# Patient Record
Sex: Female | Born: 1940 | Race: White | Hispanic: No | Smoking: Never smoker
Health system: Southern US, Community
[De-identification: ages and names within clinical notes are randomized; demographics above are authoritative.]

## PROBLEM LIST (undated history)

## (undated) DIAGNOSIS — I480 Paroxysmal atrial fibrillation: Secondary | ICD-10-CM

## (undated) DIAGNOSIS — E785 Hyperlipidemia, unspecified: Secondary | ICD-10-CM

## (undated) DIAGNOSIS — Z9289 Personal history of other medical treatment: Secondary | ICD-10-CM

## (undated) DIAGNOSIS — I701 Atherosclerosis of renal artery: Secondary | ICD-10-CM

## (undated) DIAGNOSIS — I10 Essential (primary) hypertension: Secondary | ICD-10-CM

## (undated) DIAGNOSIS — I779 Disorder of arteries and arterioles, unspecified: Secondary | ICD-10-CM

## (undated) DIAGNOSIS — I5189 Other ill-defined heart diseases: Secondary | ICD-10-CM

## (undated) DIAGNOSIS — I771 Stricture of artery: Secondary | ICD-10-CM

## (undated) DIAGNOSIS — I739 Peripheral vascular disease, unspecified: Secondary | ICD-10-CM

## (undated) HISTORY — DX: Paroxysmal atrial fibrillation: I48.0

## (undated) HISTORY — DX: Other ill-defined heart diseases: I51.89

## (undated) HISTORY — DX: Hyperlipidemia, unspecified: E78.5

## (undated) HISTORY — DX: Essential (primary) hypertension: I10

## (undated) HISTORY — DX: Personal history of other medical treatment: Z92.89

## (undated) HISTORY — DX: Peripheral vascular disease, unspecified: I73.9

## (undated) HISTORY — DX: Stricture of artery: I77.1

## (undated) HISTORY — DX: Disorder of arteries and arterioles, unspecified: I77.9

## (undated) HISTORY — DX: Atherosclerosis of renal artery: I70.1

---

## 1998-06-23 ENCOUNTER — Other Ambulatory Visit: Admission: RE | Admit: 1998-06-23 | Discharge: 1998-06-23 | Payer: Self-pay | Admitting: Obstetrics and Gynecology

## 2000-02-28 ENCOUNTER — Encounter: Admission: RE | Admit: 2000-02-28 | Discharge: 2000-02-28 | Payer: Self-pay | Admitting: Gynecology

## 2000-02-28 ENCOUNTER — Encounter: Payer: Self-pay | Admitting: Gynecology

## 2004-02-01 ENCOUNTER — Encounter: Admission: RE | Admit: 2004-02-01 | Discharge: 2004-02-01 | Payer: Self-pay | Admitting: Gynecology

## 2006-02-25 ENCOUNTER — Encounter: Admission: RE | Admit: 2006-02-25 | Discharge: 2006-02-25 | Payer: Self-pay | Admitting: Neurology

## 2006-04-03 ENCOUNTER — Encounter: Admission: RE | Admit: 2006-04-03 | Discharge: 2006-04-03 | Payer: Self-pay | Admitting: Neurology

## 2006-06-27 ENCOUNTER — Encounter: Admission: RE | Admit: 2006-06-27 | Discharge: 2006-06-27 | Payer: Self-pay | Admitting: Internal Medicine

## 2007-03-27 ENCOUNTER — Ambulatory Visit (HOSPITAL_COMMUNITY): Admission: RE | Admit: 2007-03-27 | Discharge: 2007-03-27 | Payer: Self-pay | Admitting: Internal Medicine

## 2007-07-15 ENCOUNTER — Encounter: Admission: RE | Admit: 2007-07-15 | Discharge: 2007-07-15 | Payer: Self-pay | Admitting: Internal Medicine

## 2007-10-28 ENCOUNTER — Encounter: Admission: RE | Admit: 2007-10-28 | Discharge: 2007-10-28 | Payer: Self-pay | Admitting: Cardiology

## 2007-11-02 ENCOUNTER — Encounter: Admission: RE | Admit: 2007-11-02 | Discharge: 2007-11-02 | Payer: Self-pay | Admitting: Cardiology

## 2007-11-03 ENCOUNTER — Observation Stay (HOSPITAL_COMMUNITY): Admission: RE | Admit: 2007-11-03 | Discharge: 2007-11-04 | Payer: Self-pay | Admitting: Cardiology

## 2007-11-03 HISTORY — PX: CARDIAC CATHETERIZATION: SHX172

## 2008-03-30 HISTORY — PX: OTHER SURGICAL HISTORY: SHX169

## 2008-07-15 ENCOUNTER — Encounter: Admission: RE | Admit: 2008-07-15 | Discharge: 2008-07-15 | Payer: Self-pay | Admitting: Internal Medicine

## 2008-07-26 ENCOUNTER — Ambulatory Visit: Payer: Self-pay | Admitting: Internal Medicine

## 2008-07-26 DIAGNOSIS — I10 Essential (primary) hypertension: Secondary | ICD-10-CM | POA: Insufficient documentation

## 2008-07-26 DIAGNOSIS — M316 Other giant cell arteritis: Secondary | ICD-10-CM

## 2008-07-26 DIAGNOSIS — I701 Atherosclerosis of renal artery: Secondary | ICD-10-CM | POA: Insufficient documentation

## 2008-07-26 DIAGNOSIS — M314 Aortic arch syndrome [Takayasu]: Secondary | ICD-10-CM

## 2008-08-18 ENCOUNTER — Ambulatory Visit: Payer: Self-pay | Admitting: Internal Medicine

## 2009-07-17 ENCOUNTER — Encounter: Admission: RE | Admit: 2009-07-17 | Discharge: 2009-07-17 | Payer: Self-pay | Admitting: Internal Medicine

## 2009-08-28 HISTORY — PX: TRANSTHORACIC ECHOCARDIOGRAM: SHX275

## 2009-09-29 HISTORY — PX: NM MYOCAR PERF WALL MOTION: HXRAD629

## 2009-11-07 ENCOUNTER — Inpatient Hospital Stay (HOSPITAL_COMMUNITY): Admission: AD | Admit: 2009-11-07 | Discharge: 2009-11-08 | Payer: Self-pay | Admitting: Diagnostic Radiology

## 2009-11-07 HISTORY — PX: RENAL ANGIOPLASTY: SHX2316

## 2010-10-25 ENCOUNTER — Encounter: Admission: RE | Admit: 2010-10-25 | Discharge: 2010-10-25 | Payer: Self-pay | Admitting: Internal Medicine

## 2010-12-30 HISTORY — PX: OTHER SURGICAL HISTORY: SHX169

## 2011-04-03 LAB — CBC
HCT: 32.6 % — ABNORMAL LOW (ref 36.0–46.0)
Hemoglobin: 11.3 g/dL — ABNORMAL LOW (ref 12.0–15.0)
MCHC: 34.6 g/dL (ref 30.0–36.0)
MCV: 92.3 fL (ref 78.0–100.0)
Platelets: 254 10*3/uL (ref 150–400)
RBC: 3.53 MIL/uL — ABNORMAL LOW (ref 3.87–5.11)
WBC: 10.8 10*3/uL — ABNORMAL HIGH (ref 4.0–10.5)

## 2011-04-03 LAB — BASIC METABOLIC PANEL
BUN: 24 mg/dL — ABNORMAL HIGH (ref 6–23)
CO2: 28 mEq/L (ref 19–32)
Calcium: 9 mg/dL (ref 8.4–10.5)
Chloride: 105 mEq/L (ref 96–112)
Glucose, Bld: 143 mg/dL — ABNORMAL HIGH (ref 70–99)
Potassium: 3.6 mEq/L (ref 3.5–5.1)

## 2011-05-14 NOTE — Cardiovascular Report (Signed)
NAME:  Gabriela Moyer, Gabriela Moyer                ACCOUNT NO.:  [id number]   MEDICAL RECORD NO.:  [id number]          PATIENT TYPE:  AMB   LOCATION:  SDS                          FACILITY:  [facility]   PHYSICIAN:  Vonna Kotyk R. Jacinto Halim, MD       DATE OF BIRTH:  1941/01/23   DATE OF PROCEDURE:  11/03/2007  DATE OF DISCHARGE:                            CARDIAC CATHETERIZATION   REFERRING PHYSICIANS:  Nicki Guadalajara, M.D. and Gaspar Garbe, M.D.   PROCTOR : DR. Erlene Quan.   PROCEDURE PERFORMED:  1. Arch aortogram.  2. Right subclavian arteriogram with follow-through.  3. Left subclavian arteriogram with follow-through  4. Abdominal aortogram.  5. Selective right and left renal arteriography.   INDICATIONS:  Gabriela Moyer a 70 year old female who was referred to me for  evaluation of upper extremity claudication, abnormal heart sounds.  She  also has difficult to control hypertension.  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  Because of uncontrolled  hypertension and a prominent abdominal bruit, we decided to proceed with  abdominal aortogram and also select right and left subclavian  arteriogram because of claudication symptoms.   ANGIOGRAPHIC DATA:  Right subclavian artery and follow-through:  Right  subclavian artery with follow-through revealed the right subclavian  artery to be diffusely diseased.  After the origin of the vertebral  artery, the right subclavian artery is hypoplastic with diffuse luminal  irregularity.  There is an 80% stenosis of the distal right subclavian  artery followed by axillary artery which appears to be occluded and has  ipsilateral bridging collaterals.  The brachial artery itself is widely  patent and smooth.   Below the right cubital fossa the brachial artery bifurcates into a  small lateral anterior artery and a large radial artery.  These are  patent.   Left subclavian artery  with follow-through:  Left subclavian artery with  follow-through again revealed similar appearance as to the right side.  However, the disease severity is much less.  There is a 50% stenosis of  the left subclavian artery after the origin of the vertebral artery.  The left axillary artery in the proximal segment also shows diffuse 50%  stenosis.  The left brachial artery is widely patent and again the ulnar  artery is small but patent, and the left radial artery is a larger  vessel and forms the palmar arch similar to the palmar arch on the right  side.   Abdominal aortogram:  Abdominal gram revealed the presence of 2 renal  arteries.  Right renal artery was not well visualized.   Selective right and left renal arteriography:  Select right and left  renal arteriography revealed widely patent left renal artery.  Right  renal artery showed an 80-90% stenosis.  There was a 70-80 mm pressure  gradient across the right renal. Artery ostium.   The abdominal aorta itself was normal.  No evidence of abdominal aortic  aneurysm and the aortoiliac bifurcation was widely patent.   IMPRESSION:  1. 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  2. Atherosclerotic right renal artery stenosis.  3. Difficult to control hypertension.   RECOMMENDATIONS:  The patient was taken off __________ for evaluation of  her angiograms, both of her upper extremities and also abdominal  aortogram and renal arteriogram.  I probably suspect an evaluation for  connective tissue disorders is indicated.  Although she has an occluded  right axillary artery which has bridging collaterals, I am not sure that  long-term success with angioplasty is great in people with vasculitides.  Hence a course of steroids at high dose and/or a low-dose chronic  steroid use may be considered.  She will benefit from  a rheumatology  consultation.   As far as right renal artery stenosis is concerned, there was severe  damping of the right renal artery pressures on engagement of the right  renal artery and this stenosis is pretty significant.  I will bring her  back for elective angioplasty of the right renal artery.   A total of 155 mL of contrast was utilized for diagnostic angiography.   TECHNIQUE OF PROCEDURE:  Under usual sterile precautions using a 5-  French pigtail catheter which was advanced to the arch of the aorta,  arch arteriogram was performed in the LAO projection.  Then using a  Bernstein catheter, I was selectively able to engage the right  subclavian artery and angiography was performed.  Nitroglycerin was also  administered intra-arterially.  The same catheter was utilized to engage  the left subclavian artery and angiography was performed.  Then the  catheter was pulled back into the abdominal aorta.  After having  performed abdominal aortogram with a pigtail catheter, the same  Bernstein catheter was utilized to engage the left renal artery, and a  short LIMA catheter was utilized to engage the right renal artery, and  then the catheters were pulled out of the body.  The patient tolerated  the procedure.  No immediate complication noted.      Cristy Hilts. Jacinto Halim, MD  Electronically Signed     JRG/MEDQ  D:  11/03/2007  T:  11/04/2007  Job:  [id number]   cc:   Nicki Guadalajara, M.D.  Gaspar Garbe, M.D.

## 2011-05-14 NOTE — Procedures (Signed)
NAME:  Gabriela Moyer, RAU NO.:  [id number]   MEDICAL RECORD NO.:  [id number]          PATIENT TYPE:  AMB   LOCATION:  SDS                          FACILITY:  [facility]   PHYSICIAN:  Nanetta Batty, M.D.   DATE OF BIRTH:  1941/08/17   DATE OF PROCEDURE:  DATE OF DISCHARGE:                    PERIPHERAL VASCULAR INVASIVE PROCEDURE   PROCEDURES:  RH anagram, selective right and left subclavian RA  angiogram, abdominal aortogram, selective right renal artery angiogram,  PTA and stent of right renal artery.   HISTORY:  Gabriela Moyer is a 70 year old moderately overweight, divorced  Caucasian female, mother of 2 and grandmother of 2 grandchildren who is  retired from the [employer] at age 38.  She has been followed  by Dr. Daphene Jaeger for years.  Her risk factors include hyperlipidemia and  hypertension both treated.  Her mother had bypass surgery at age 27.  She has never had a heart attack or stroke.  Denies chest pain or  shortness of breath.  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 Yates Decamp November 03, 2007.  She also has what  described as an 80% right renal artery stenosis with an 80 mm gradient  at the ostium.  She does complain of some right upper extremity  claudication.  Dopplers performed recently suggested progression of  disease of the right renal artery with decrease in pole-to-pole  dimension suggesting that the vessel had become more stenotic and that  the kidney had somewhat shrunken in size.  She presents now for  angiography of the subclavian arteries to document progression of  disease as well as elective PTA and stenting of her right renal artery  for renal preservation.   DESCRIPTION OF PROCEDURE:  The patient was brought to the second floor  [facility] in the postabsorptive state.  She was  premedicated with PR valium.   Her right groin was prepped and shaved in  the usual sterile fashion.  Xylocaine 1% was used for local anesthesia.  A 6-French sheath was inserted into the right femoral artery using  standard Seldinger technique.  A 5-French pigtailed catheter was used  for arch angiography and distal abdominal aortography.  Selective right  and left subclavian artery angiography was performed with a 5-French  long range Judkins' catheter as was selective right renal artery  angiography.  Visipaque dye was used for the entirety of the case.  Retrograde aortic pressure was monitored during the case.   ANGIOGRAPHIC RESULTS:  1. Arch aortogram:  Type 2 arch:      a.     Right subclavian artery:  80% distal right subclavian artery       stenosis, 99% right axillary artery stenosis.      b.     Diffuse 60% segmental distal left subclavian artery       stenosis.  2. Abdominal aortography:      a.     Renal artery:  80% ostial right renal artery stenosis.      b.  Normal infrarenal abdominal aorta.      c.     Iliac bifurcation was free of significant disease.   The patient received 2500 units of heparin intravenously.  Using a 6-  [place] short right Judkins' guide catheter along with ON4 190 Spartacore  Abbott guidewire and a 4 mm x 1.5 cm Biotrack Abbott balloon,  predilation was performed at the ostium of the right renal artery.  Prior to this, a transstenotic gradient was measured at 65 mmHg using  the 5-French right Judkins' catheter.  A 5 mm x 15 mm long Cordis  Genesis Aviator stent was then placed at the ostium of the right renal  artery and deployed at 6 atmospheres.  A final inflation was performed  at 8 atmospheres resulting in reduction of the 80% ostial right renal  artery stenosis with 0% residual with excellent flow.  The patient  tolerated the procedure well.  The guidewire and catheter were removed  __________measured.  The sheath was  removed and pressure was held on the groin to  achieve hemostasis.  The  patient left the lab in stable condition.  She will be gently hydrated  overnight and discharged home in the morning.  She can follow up renal  Doppler studies after which she will be seen by me in followup.      Nanetta Batty, M.D.  Electronically Signed     JB/MEDQ  D:  11/07/2009  T:  11/07/2009  Job:  [id number]   cc:   [facility]  Gaspar Garbe, M.D.  Lemmie Evens, M.D.  Nicki Guadalajara, M.D.

## 2011-05-14 NOTE — Discharge Summary (Signed)
NAME:  Gabriela Moyer, Gabriela Moyer NO.:  [id number]   MEDICAL RECORD NO.:  [id number]          PATIENT TYPE:  INP   LOCATION:  2001                         FACILITY:  [facility]   PHYSICIAN:  Cristy Hilts. Jacinto Halim, MD       DATE OF BIRTH:  [birth year]   DATE OF ADMISSION:  11/03/2007  DATE OF DISCHARGE:  11/04/2007                               DISCHARGE SUMMARY   HISTORY OF PRESENT ILLNESS:  Ms. Guarino is a 70 year old female patient  of Dr. Nicki Guadalajara and Dr. Guerry Bruin.  She was complaining of  fatigue in both her upper extremities, thus, she was referred to Dr.  Jacinto Halim for PV evaluation.  She apparently had upper extremity Dopplers  that showed high-grade stenosis on the right, more than the left,  subclavian artery stenosis, high-grade stenosis of the right __________  and the left subclavian artery, moderate stenosis.  Thus, she came into  the hospital for peripheral vascular angiogram.  She had a  nonobstructive bilateral subclavian stenosis which Dr. 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  He did find that  she had an 80% right renal artery stenosis.  She apparently had an  episode of hypotension.  Thus, she was kept overnight.  Her HCTZ was  held, and her Norvasc was decreased.  Her blood pressure on the day of  discharge was 124/48, pulse was 68, respirations were 12.  Dr. Jacinto Halim  recommended follow-up in the office to discuss management of possible  vasculitis, etiology of her upper extremity fatigue.   DISCHARGE MEDICATIONS:  1. Toprol XL 50 mg every day.  2. Aspirin 81 mg every day.  3. Altace 5 mg every day.  4. Plavix 75 mg every day.  5. Crestor 10 mg every day.  6. Norvasc 5 mg per day.  7. Vitamin B12.   DISCHARGE DIAGNOSES:  1. Upper extremity fatigue, not related to atherosclerotic stenosis,      possible vasculitis etiology to be worked up further as an      outpatient.  2. ASCVD with right renal artery stenosis 80%.  3.  Episode of hypotension, medications adjusted.  4. History of hypertension.  5. Dyslipidemia.   PLAN:  She was told not do any heavy lifting, pushing, pulling or  extended walking for one week, and she had will follow up with Dr. Jacinto Halim  on November 10 at 10:15 a.m. thank you.      Lezlie Octave, N.P.      Cristy Hilts. Jacinto Halim, MD  Electronically Signed    BB/MEDQ  D:  11/04/2007  T:  11/04/2007  Job:  [id number]   cc:   Dr. Leta Jungling, M.D.

## 2011-10-08 LAB — CBC
HCT: 34.1 — ABNORMAL LOW
MCHC: 34.2
RDW: 12.1
WBC: 9

## 2011-10-08 LAB — BASIC METABOLIC PANEL
CO2: 30
Calcium: 8.8
Chloride: 105
GFR calc non Af Amer: >60

## 2011-10-08 LAB — URINALYSIS, ROUTINE W REFLEX MICROSCOPIC
Ketones, ur: 15 — AB
Protein, ur: NEGATIVE
Urobilinogen, UA: 0.2
pH: 6

## 2011-10-08 LAB — URINE MICROSCOPIC-ADD ON

## 2011-11-13 ENCOUNTER — Other Ambulatory Visit: Payer: Self-pay | Admitting: Internal Medicine

## 2011-11-13 DIAGNOSIS — Z1231 Encounter for screening mammogram for malignant neoplasm of breast: Secondary | ICD-10-CM

## 2011-12-19 ENCOUNTER — Ambulatory Visit
Admission: RE | Admit: 2011-12-19 | Discharge: 2011-12-19 | Disposition: A | Discharge status: Home / Self Care | Payer: Medicare Other | Source: Ambulatory Visit | Attending: Internal Medicine | Admitting: Internal Medicine

## 2011-12-19 DIAGNOSIS — Z1231 Encounter for screening mammogram for malignant neoplasm of breast: Secondary | ICD-10-CM

## 2012-04-29 HISTORY — PX: OTHER SURGICAL HISTORY: SHX169

## 2012-11-27 ENCOUNTER — Other Ambulatory Visit: Payer: Self-pay | Admitting: Internal Medicine

## 2012-11-27 DIAGNOSIS — Z1231 Encounter for screening mammogram for malignant neoplasm of breast: Secondary | ICD-10-CM

## 2013-01-08 ENCOUNTER — Ambulatory Visit: Payer: Medicare Other

## 2013-02-05 ENCOUNTER — Ambulatory Visit
Admission: RE | Admit: 2013-02-05 | Discharge: 2013-02-05 | Disposition: A | Discharge status: Home / Self Care | Payer: Medicare Other | Source: Ambulatory Visit | Attending: Internal Medicine | Admitting: Internal Medicine

## 2013-02-05 DIAGNOSIS — Z1231 Encounter for screening mammogram for malignant neoplasm of breast: Secondary | ICD-10-CM

## 2013-06-17 ENCOUNTER — Other Ambulatory Visit (HOSPITAL_COMMUNITY): Payer: Self-pay | Admitting: Cardiovascular Disease

## 2013-06-17 DIAGNOSIS — I739 Peripheral vascular disease, unspecified: Secondary | ICD-10-CM

## 2013-08-16 ENCOUNTER — Encounter (HOSPITAL_COMMUNITY): Payer: Self-pay | Admitting: Cardiovascular Disease

## 2013-09-03 ENCOUNTER — Ambulatory Visit (HOSPITAL_COMMUNITY)
Admission: RE | Admit: 2013-09-03 | Discharge: 2013-09-03 | Disposition: A | Discharge status: Home / Self Care | Payer: Medicare Other | Source: Ambulatory Visit | Attending: Cardiovascular Disease | Admitting: Cardiovascular Disease

## 2013-09-03 ENCOUNTER — Other Ambulatory Visit (HOSPITAL_COMMUNITY): Payer: Self-pay | Admitting: Cardiovascular Disease

## 2013-09-03 ENCOUNTER — Ambulatory Visit (HOSPITAL_BASED_OUTPATIENT_CLINIC_OR_DEPARTMENT_OTHER)
Admission: RE | Admit: 2013-09-03 | Discharge: 2013-09-03 | Disposition: A | Discharge status: Home / Self Care | Payer: Medicare Other | Source: Ambulatory Visit | Attending: Cardiovascular Disease | Admitting: Cardiovascular Disease

## 2013-09-03 DIAGNOSIS — I771 Stricture of artery: Secondary | ICD-10-CM

## 2013-09-03 DIAGNOSIS — I739 Peripheral vascular disease, unspecified: Secondary | ICD-10-CM

## 2013-09-03 DIAGNOSIS — I10 Essential (primary) hypertension: Secondary | ICD-10-CM

## 2013-09-03 DIAGNOSIS — I701 Atherosclerosis of renal artery: Secondary | ICD-10-CM

## 2013-09-03 NOTE — Progress Notes (Addendum)
Upper Ext. Arterial Duplex Completed. Glenda Kunst, BS, RDMS, RVT  

## 2013-09-03 NOTE — Progress Notes (Signed)
Renal Duplex Completed. Gabriela Moyer, BS, RDMS, RVT  

## 2013-09-20 ENCOUNTER — Telehealth: Payer: Self-pay | Admitting: *Deleted

## 2013-09-20 ENCOUNTER — Encounter: Payer: Self-pay | Admitting: *Deleted

## 2013-09-20 DIAGNOSIS — I739 Peripheral vascular disease, unspecified: Secondary | ICD-10-CM

## 2013-09-20 NOTE — Telephone Encounter (Signed)
Message copied by Marella Bile on Mon Sep 20, 2013 10:27 PM ------      Message from: Runell Gess      Created: Sun Sep 19, 2013 11:02 AM       No change from prior study. Repeat in 12 months. ------

## 2013-09-20 NOTE — Telephone Encounter (Signed)
Order placed for repeat upper extremity dopplers and renal dopplers

## 2013-10-21 ENCOUNTER — Encounter: Payer: Self-pay | Admitting: Cardiovascular Disease

## 2013-10-21 ENCOUNTER — Ambulatory Visit (INDEPENDENT_AMBULATORY_CARE_PROVIDER_SITE_OTHER): Payer: Medicare Other | Admitting: Cardiovascular Disease

## 2013-10-21 VITALS — BP 140/92 | HR 73 | Ht 62.0 in | Wt 165.7 lb

## 2013-10-21 DIAGNOSIS — I771 Stricture of artery: Secondary | ICD-10-CM

## 2013-10-21 DIAGNOSIS — E039 Hypothyroidism, unspecified: Secondary | ICD-10-CM

## 2013-10-21 DIAGNOSIS — I701 Atherosclerosis of renal artery: Secondary | ICD-10-CM

## 2013-10-21 DIAGNOSIS — I10 Essential (primary) hypertension: Secondary | ICD-10-CM

## 2013-10-21 MED ORDER — LOSARTAN POTASSIUM 25 MG PO TABS: 25.0000 mg | ORAL_TABLET | Freq: Two times a day (BID) | ORAL | Status: DC

## 2013-10-21 NOTE — Patient Instructions (Signed)
Your physician recommends that you schedule a follow-up appointment in: 6 months  Your physician has recommended you make the following change in your medication: Start Losartan 25 mg take 1 tablets daily for 1 week, then take 1 tablet twice a day

## 2013-10-31 ENCOUNTER — Encounter: Payer: Self-pay | Admitting: Cardiovascular Disease

## 2013-10-31 DIAGNOSIS — E039 Hypothyroidism, unspecified: Secondary | ICD-10-CM | POA: Insufficient documentation

## 2013-10-31 DIAGNOSIS — I771 Stricture of artery: Secondary | ICD-10-CM | POA: Insufficient documentation

## 2013-10-31 NOTE — Progress Notes (Signed)
Patient ID: Gabriela Moyer, female   DOB: 20-Sep-1941, 72 y.o.   MRN: [id number]     HPI: Gabriela Moyer is a 72 y.o. female presents to the office for cardiology reevaluation Satira Mccallum saw her one year ago.  Gabriela Moyer has a history of diffuse peripheral vascular disease involving her subclavian, axillary, brachial vessels as well as renal arteries. In July 2010 she underwent right renal artery stenting and at that time at 8% ostial gradient. 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 Additional problems also include hypertension as well as hyperlipidemia. Since I last saw her, she did see Dr. Allyson Sabal in April 2014 for followup of her peripheral vascular disease. In September 2014 she underwent recent Doppler study which again confirmed bilateral brachial pressures consistent with right upper extremity inflow disease. There was a large amount of diffuse atherosclerotic plaque with elevated velocities in the right distal subclavian and axillary arteries concordant with her known 70-99% diameter reduction. She also had moderate amount of diffuse atherosclerotic plaque with elevated velocities consistent with a 50-69% diameter reduction in her left distal subclavian/axillary and brachial arteries.  Since I last saw Gabriela Moyer, she apparently is no longer taking her Prempro she is on Crestor for hyperlipidemia but in the past apparently did not tolerate Zetia addition. She will be having a physical by Dr. Neale Burly and laboratory will be checked by him.  She denies chest pain. She denies palpitations. She denies shortness of breath.   History reviewed. No pertinent past medical history.  History reviewed. No pertinent past surgical history.  Allergies  Allergen Reactions  . Codeine   . Morphine     Current Outpatient Prescriptions  Medication Sig Dispense Refill  . amLODipine (NORVASC) 10 MG tablet Take 10 mg by mouth daily.      Marland Kitchen aspirin 81 MG  tablet Take 81 mg by mouth daily.      . Cholecalciferol (VITAMIN D3) 1000 UNITS CAPS Take 2 capsules by mouth daily.      . clopidogrel (PLAVIX) 75 MG tablet Take 75 mg by mouth daily.      . hydrochlorothiazide (HYDRODIURIL) 25 MG tablet Take 25 mg by mouth daily.      Marland Kitchen levothyroxine (SYNTHROID, LEVOTHROID) 25 MCG tablet Take 25 mcg by mouth daily before breakfast.      . metoprolol tartrate (LOPRESSOR) 25 MG tablet Take 25 mg by mouth 2 (two) times daily.      . rosuvastatin (CRESTOR) 20 MG tablet Take 20 mg by mouth daily.      Marland Kitchen losartan (COZAAR) 25 MG tablet Take 1 tablet (25 mg total) by mouth 2 (two) times daily.  60 tablet  11   No current facility-administered medications for this visit.    History   Social History  . Marital Status: Divorced    Spouse Name: N/A    Number of Children: N/A  . Years of Education: N/A   Occupational History  . Not on file.   Social History Main Topics  . Smoking status: Passive Smoke Exposure - Never Smoker  . Smokeless tobacco: Never Used  . Alcohol Use: No  . Drug Use: Not on file  . Sexual Activity: Not on file   Other Topics Concern  . Not on file   Social History Narrative  . No narrative on file    Family History  Problem Relation Age of Onset  . Heart disease Mother   . Cancer  Father    Social history is notable in that she is divorced. She has 2 children 2 grandchildren. Has no history of tobacco use. She does not use alcohol. ROS is negative for fevers, chills or night sweats.  She denies visual symptoms. She denies palpitations. She denies cough or sputum production. She denies chest pain. She denies bleeding on aspirin and Plavix. She denies blood in her stool or urine. She denies abdominal changes. She denies myalgias. She does have history of hypothyroidism on low-dose Synthroid. She denies claudication or lower extremity.  Other comprehensive 12 point system review is negative.  PE BP 140/92  Pulse 73  Ht 5\' 2"   (1.575 m)  Wt 165 lb 11.2 oz (75.161 kg)  BMI 30.30 kg/m2  Blood pressure in her left arm recheck by me was 140/90. Blood pressure right arm is hard to discern. General: Alert, oriented, no distress.  Skin: normal turgor, no rashes HEENT: Normocephalic, atraumatic. Pupils round and reactive; sclera anicteric;no lid lag.  Nose without nasal septal hypertrophy Mouth/Parynx benign; Mallinpatti scale 3 Neck: No JVD, bilateral subclavian bruits right greater than left Lungs: clear to ausculatation and percussion; no wheezing or rales Heart: RRR, s1 s2 normal 1/6 systolic murmur. Abdomen: soft, nontender; no hepatosplenomehaly, BS+; abdominal aorta nontender and not dilated by palpation. Pulses: bilateral subclavian bruits right greater than left. Extremities: no clubbing cyanosis or edema, Homan's sign negative  Neurologic: grossly nonfocal Psychologic: normal affect and mood.  ECG: Sinus rhythm at 73 beats per minute. Poor progression precordially. Mild J-point early repolarization changes inferolaterally  LABS:  BMET    Component Value Date/Time   NA 139 11/08/2009 0425   K 3.6 11/08/2009 0425   CL 105 11/08/2009 0425   CO2 28 11/08/2009 0425   GLUCOSE 143* 11/08/2009 0425   BUN 24* 11/08/2009 0425   CREATININE 0.99 11/08/2009 0425   CALCIUM 9.0 11/08/2009 0425   GFRNONAA 56* 11/08/2009 0425   GFRAA  Value: >60        The eGFR has been calculated using the MDRD equation. This calculation has not been validated in all clinical situations. eGFR's persistently <60 mL/min signify possible Chronic Kidney Disease. 11/08/2009 0425     Hepatic Function Panel  No results found for this basename: prot, albumin, ast, alt, alkphos, bilitot, bilidir, ibili     CBC    Component Value Date/Time   WBC 10.8* 11/08/2009 0425   RBC 3.53* 11/08/2009 0425   HGB 11.3* 11/08/2009 0425   HCT 32.6* 11/08/2009 0425   PLT 254 11/08/2009 0425   MCV 92.3 11/08/2009 0425   MCHC 34.6 11/08/2009  0425   RDW 12.4 11/08/2009 0425     BNP No results found for this basename: probnp    Lipid Panel  No results found for this basename: chol, trig, hdl, cholhdl, vldl, ldlcalc     RADIOLOGY: No results found.    ASSESSMENT AND PLAN: Gabriela Moyer has history of diffuse peripheral vascular disease and is status post stenting of a right renal artery November 2010. She has had followup duplex imaging and this has remained stable. She continues to be fairly symptom-free with reference to her right subclavian artery stenosis. She now is off ACE inhibition. I elected to start her on for a low-dose ARB therapy with Cozaar 25 mg for one week and then if she tolerates this we'll increase this to 25 mg twice a day. She will have followup laboratory done by Dr. Dora Sims to make certain she  is tolerating this and to continue dual antiplatelet therapy. I will see her in 6 months for cardiology reevaluation.     Lennette Bihari, MD, [facility]  10/31/2013 3:27 PM

## 2013-11-02 ENCOUNTER — Encounter: Payer: Self-pay | Admitting: Cardiovascular Disease

## 2013-11-12 ENCOUNTER — Other Ambulatory Visit: Payer: Self-pay | Admitting: *Deleted

## 2013-11-12 MED ORDER — METOPROLOL TARTRATE 25 MG PO TABS: 25.0000 mg | ORAL_TABLET | Freq: Two times a day (BID) | ORAL | Status: DC

## 2013-11-12 NOTE — Telephone Encounter (Signed)
Rx was sent to pharmacy electronically. 

## 2013-12-15 ENCOUNTER — Other Ambulatory Visit: Payer: Self-pay | Admitting: Cardiovascular Disease

## 2013-12-15 NOTE — Telephone Encounter (Signed)
Rx was sent to pharmacy electronically. 

## 2014-03-22 ENCOUNTER — Other Ambulatory Visit (HOSPITAL_BASED_OUTPATIENT_CLINIC_OR_DEPARTMENT_OTHER): Payer: Self-pay | Admitting: Internal Medicine

## 2014-03-22 DIAGNOSIS — Z1231 Encounter for screening mammogram for malignant neoplasm of breast: Secondary | ICD-10-CM

## 2014-04-20 ENCOUNTER — Encounter: Payer: Self-pay | Admitting: *Deleted

## 2014-04-28 ENCOUNTER — Ambulatory Visit (INDEPENDENT_AMBULATORY_CARE_PROVIDER_SITE_OTHER): Payer: Commercial Managed Care - HMO | Admitting: Cardiovascular Disease

## 2014-04-28 ENCOUNTER — Encounter: Payer: Self-pay | Admitting: Cardiovascular Disease

## 2014-04-28 VITALS — BP 128/72 | HR 73 | Ht 62.0 in | Wt 162.1 lb

## 2014-04-28 DIAGNOSIS — I10 Essential (primary) hypertension: Secondary | ICD-10-CM

## 2014-04-28 DIAGNOSIS — E039 Hypothyroidism, unspecified: Secondary | ICD-10-CM

## 2014-04-28 DIAGNOSIS — E785 Hyperlipidemia, unspecified: Secondary | ICD-10-CM

## 2014-04-28 DIAGNOSIS — I771 Stricture of artery: Secondary | ICD-10-CM

## 2014-04-28 DIAGNOSIS — I701 Atherosclerosis of renal artery: Secondary | ICD-10-CM

## 2014-04-28 NOTE — Progress Notes (Signed)
Patient ID: Gabriela Moyer, female   DOB: May 31, 1941, 73 y.o.   MRN: [id number]     HPI: Gabriela Moyer is a 73 y.o. female presents to the office for a 6 month cardiology evaluation.    [name] has a history of diffuse peripheral vascular disease involving her subclavian, axillary, brachial vessels as well as renal arteries. In July 2010 she underwent right renal artery stenting and at that time at 80 mm ostial gradient. 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 Additional problems also include hypertension as well as hyperlipidemia. She saw  Dr. Gwenlyn Found in April 2014 for followup of her peripheral vascular disease. In September 2014 she underwent recent Doppler study which again confirmed bilateral brachial pressures consistent with right upper extremity inflow disease. There was a large amount of diffuse atherosclerotic plaque with elevated velocities in the right distal subclavian and axillary arteries concordant with her known 70-99% diameter reduction. She also had moderate amount of diffuse atherosclerotic plaque with elevated velocities consistent with a 50-69% diameter reduction in her left distal subclavian/axillary and brachial arteries.  She has a history of hypothyroidism, for, which she's on Synthroid at low dose 25 mcg, as well as a history of hyperlipidemia, and has been tolerating Crestor 20 mg daily.  She did have blood work done by Dr. Nona Dell in December.  I was able to obtain these from his office today.  I reviewed this laboratory.  Potassium was 4.0.  Normal renal function with acrania 0.7.  Thyroid function was normal.  Hemoglobin A1c was excellent at 5.4.  There is no evidence for microalbuminuria cholesterol was very good with a total close to 153, triglycerides 137, HDL 54, LDL 72.  Non-HDL cholesterol 99.  She has remained active.  She has recently had a cold and URI congestion.  This is resolving. She denies chest  pain. She denies palpitations. She denies shortness of breath.   Past Medical History  Diagnosis Date  . PVD (peripheral vascular disease)   . Hypertension   . Hyperlipidemia   . Subclavian arterial stenosis     Past Surgical History  Procedure Laterality Date  . Transthoracic echocardiogram  08/28/2009    EF=>55%; trace TR; mild AV regurg, mild pulm valve regurg  . Nm myocar perf wall motion  09/2009    bruce myoview - normal perfusion, EF 915, low risk scan  . Cardiac catheterization  11/03/2007    atherosclerotic right renal artery (Dr. Jackie Plum)  . Renal artery doppler  04/2012    celiac artery with >50% diameter reduction; R renal artery stent w/mildly elevated velocities (1-59% diameter reduction)  . Upper extremity arterial doppler  04/2012    right brachial pressure 31mHg, left 1252mg; R distal subclavian & axillary arteries 70-99% diameter reduction; L distal subclavian/axillary & brachial arteries (50-69% diameter reduction)  . Carotid doppler  12/2010    R & L ICA with small amount fibrous plaque; R distal subclav. 70-99% diameter reduction; L mid subclav 50-69% diameter reduction  . Lower extremity arterial doppler  03/2008    right ABI - mild arterial insuff at rest; R CIA with less than 50% diameter reduction, R & L SFA with less than 50% diameter reduction  . Renal angioplasty  11/07/2009    [id number] Genesis Aviator stent to ostium of right renal artery (Dr. J. Adora Fridge  Allergies  Allergen Reactions  . Codeine   . Contrast Media [Iodinated Diagnostic  Agents]   . Morphine     Current Outpatient Prescriptions  Medication Sig Dispense Refill  . amLODipine (NORVASC) 10 MG tablet TAKE ONE TABLET BY MOUTH ONCE DAILY  90 tablet  3  . aspirin 81 MG tablet Take 81 mg by mouth daily.      . clopidogrel (PLAVIX) 75 MG tablet TAKE ONE TABLET BY MOUTH ONCE DAILY  90 tablet  3  . hydrochlorothiazide (HYDRODIURIL) 25 MG tablet Take 25 mg by mouth daily.      Marland Kitchen  levothyroxine (SYNTHROID, LEVOTHROID) 25 MCG tablet Take 25 mcg by mouth daily before breakfast.      . metoprolol tartrate (LOPRESSOR) 25 MG tablet Take 1 tablet (25 mg total) by mouth 2 (two) times daily.  60 tablet  11  . rosuvastatin (CRESTOR) 20 MG tablet Take 20 mg by mouth daily.       No current facility-administered medications for this visit.    History   Social History  . Marital Status: Divorced    Spouse Name: N/A    Number of Children: 2  . Years of Education: 12   Occupational History  . Not on file.   Social History Main Topics  . Smoking status: Passive Smoke Exposure - Never Smoker  . Smokeless tobacco: Never Used  . Alcohol Use: No  . Drug Use: Not on file  . Sexual Activity: Not on file   Other Topics Concern  . Not on file   Social History Narrative  . No narrative on file    Family History  Problem Relation Age of Onset  . Heart disease Mother   . Stroke Mother   . Cancer Father   . Heart Problems Maternal Grandmother   . Heart Problems Maternal Grandfather   . Cancer Brother    Social history is notable in that she is divorced. She has 2 children 2 grandchildren. Has no history of tobacco use. She does not use alcohol.  ROS General: Negative; No fevers, chills, or night sweats;  HEENT: Negative; No changes in vision or hearing, sinus congestion, difficulty swallowing Pulmonary: Positive for recent URI symptoms, which have now improved.; No cough, wheezing, shortness of breath, hemoptysis Cardiovascular: Negative; No chest pain, presyncope, syncope, palpatations GI: Negative; No nausea, vomiting, diarrhea, or abdominal pain GU: Negative; No dysuria, hematuria, or difficulty voiding Musculoskeletal: Negative; no myalgias, joint pain, or weakness Hematologic/Oncology: Negative; no easy bruising, bleeding Endocrine: Positive for hypothyroidism.  Negative for diabetes.; no heat/cold intolerance Neuro: Negative; no changes in balance,  headaches Skin: Negative; No rashes or skin lesions Psychiatric: Negative; No behavioral problems, depression Sleep: Negative; No snoring, daytime sleepiness, hypersomnolence, bruxism, restless legs, hypnogognic hallucinations, no cataplexy Other comprehensive 14 point system review is negative   PE BP 128/72  Pulse 73  Ht 5' 2" (1.575 m)  Wt 162 lb 1.6 oz (73.528 kg)  BMI 29.64 kg/m2  Blood pressure in her left arm recheck by me was 122/70. Blood pressure right arm is 100/66 General: Alert, oriented, no distress.  Skin: normal turgor, no rashes HEENT: Normocephalic, atraumatic. Pupils round and reactive; sclera anicteric;no lid lag.  Nose without nasal septal hypertrophy Mouth/Parynx benign; Mallinpatti scale 3 Neck: No JVD, bilateral subclavian bruits right greater than left Chest wall: Nontender to palpation Lungs: clear to ausculatation and percussion; no wheezing or rales Heart: RRR, s1 s2 normal 1/6 systolic murmur. Abdomen: soft, nontender; no hepatosplenomehaly, BS+; abdominal aorta nontender and not dilated by palpation. Pulses: bilateral subclavian bruits right greater  than left. Back: No CVA tenderness Musculoskeletal: No joint tenderness Extremities: Trace ankle swelling, left greater than right no clubbing cyanosis, Homan's sign negative  Neurologic: grossly nonfocal Psychologic: normal affect and mood.  ECG (independently read by me): Sinus rhythm 73 beats per minute.  No significant ST-T changes.  Mild early repolarization changes.  Prior 10/21/2013 ECG: Sinus rhythm at 73 beats per minute. Poor progression precordially. Mild J-point early repolarization changes inferolaterally  LABS:  BMET    Component Value Date/Time   NA 139 11/08/2009 0425   K 3.6 11/08/2009 0425   CL 105 11/08/2009 0425   CO2 28 11/08/2009 0425   GLUCOSE 143* 11/08/2009 0425   BUN 24* 11/08/2009 0425   CREATININE 0.99 11/08/2009 0425   CALCIUM 9.0 11/08/2009 0425   GFRNONAA 56*  11/08/2009 0425   GFRAA  Value: >60        The eGFR has been calculated using the MDRD equation. This calculation has not been validated in all clinical situations. eGFR's persistently <60 mL/min signify possible Chronic Kidney Disease. 11/08/2009 0425     Hepatic Function Panel  No results found for this basename: prot,  albumin,  ast,  alt,  alkphos,  bilitot,  bilidir,  ibili     CBC    Component Value Date/Time   WBC 10.8* 11/08/2009 0425   RBC 3.53* 11/08/2009 0425   HGB 11.3* 11/08/2009 0425   HCT 32.6* 11/08/2009 0425   PLT 254 11/08/2009 0425   MCV 92.3 11/08/2009 0425   MCHC 34.6 11/08/2009 0425   RDW 12.4 11/08/2009 0425     BNP No results found for this basename: probnp    Lipid Panel  No results found for this basename: chol,  trig,  hdl,  cholhdl,  vldl,  ldlcalc     RADIOLOGY: No results found.    ASSESSMENT AND PLAN: Ms. Isabel Caprice has history of diffuse peripheral vascular disease and is status post stenting of a right renal artery November 2010. She has had followup duplex imaging and this has remained stable. She continues to be fairly symptom-free with reference to her right subclavian artery stenosis.  She did not tolerate a disinhibition, and when I last saw her.  I suggested a low-dose trial of losartan for ARB therapy.  She can stop this.  Presently, her blood pressure is well controlled.  I meant it on her weight loss of 3 pounds, and recommended continued weight loss, and increased activity as tolerated.  I reviewed the laboratory done by Dr. Johann Capers back.  She does have diffuse peripheral vascular disease as noted above.  She's not having any chest pressure.  Her blood pressure today is actually improved in the right arm compared to her previous evaluation.  She does note some trace ankle swelling may be contributed by her amlodipine.  I will see her in 6 months for followup evaluation   Troy Sine, MD, [facility]  04/28/2014 11:52 AM

## 2014-04-28 NOTE — Patient Instructions (Signed)
Your physician recommends that you schedule a follow-up appointment in: 6 months. No changes were made today in your therapy. 

## 2014-05-02 ENCOUNTER — Ambulatory Visit (HOSPITAL_BASED_OUTPATIENT_CLINIC_OR_DEPARTMENT_OTHER)
Admission: RE | Admit: 2014-05-02 | Discharge: 2014-05-02 | Disposition: A | Discharge status: Home / Self Care | Payer: Medicare HMO | Source: Ambulatory Visit | Attending: Internal Medicine | Admitting: Internal Medicine

## 2014-05-02 DIAGNOSIS — Z1231 Encounter for screening mammogram for malignant neoplasm of breast: Secondary | ICD-10-CM | POA: Insufficient documentation

## 2014-09-01 ENCOUNTER — Telehealth (HOSPITAL_COMMUNITY): Payer: Self-pay | Admitting: *Deleted

## 2014-09-13 ENCOUNTER — Other Ambulatory Visit (HOSPITAL_COMMUNITY): Payer: Self-pay | Admitting: Cardiovascular Disease

## 2014-09-13 ENCOUNTER — Telehealth (HOSPITAL_COMMUNITY): Payer: Self-pay | Admitting: *Deleted

## 2014-09-13 DIAGNOSIS — I739 Peripheral vascular disease, unspecified: Secondary | ICD-10-CM

## 2014-09-21 ENCOUNTER — Ambulatory Visit (HOSPITAL_COMMUNITY)
Admission: RE | Admit: 2014-09-21 | Discharge: 2014-09-21 | Disposition: A | Discharge status: Home / Self Care | Payer: Medicare HMO | Source: Ambulatory Visit | Attending: Cardiology | Admitting: Cardiology

## 2014-09-21 ENCOUNTER — Ambulatory Visit (HOSPITAL_BASED_OUTPATIENT_CLINIC_OR_DEPARTMENT_OTHER)
Admission: RE | Admit: 2014-09-21 | Discharge: 2014-09-21 | Disposition: A | Discharge status: Home / Self Care | Payer: Medicare HMO | Source: Ambulatory Visit | Attending: Cardiology | Admitting: Cardiology

## 2014-09-21 DIAGNOSIS — I10 Essential (primary) hypertension: Secondary | ICD-10-CM

## 2014-09-21 DIAGNOSIS — I739 Peripheral vascular disease, unspecified: Secondary | ICD-10-CM | POA: Insufficient documentation

## 2014-09-21 DIAGNOSIS — M316 Other giant cell arteritis: Secondary | ICD-10-CM

## 2014-09-21 DIAGNOSIS — I701 Atherosclerosis of renal artery: Secondary | ICD-10-CM | POA: Diagnosis present

## 2014-09-21 NOTE — Progress Notes (Signed)
Upper Ext. Arterial Duplex Completed. Abby Tucholski, BS, RDMS, RVT  

## 2014-09-21 NOTE — Progress Notes (Signed)
Renal Duplex Completed. Maxey Ransom, BS, RDMS, RVT  

## 2014-09-30 ENCOUNTER — Encounter: Payer: Self-pay | Admitting: *Deleted

## 2014-11-09 ENCOUNTER — Other Ambulatory Visit: Payer: Self-pay | Admitting: Cardiovascular Disease

## 2014-12-08 ENCOUNTER — Other Ambulatory Visit: Payer: Self-pay | Admitting: Cardiovascular Disease

## 2014-12-08 NOTE — Telephone Encounter (Signed)
Rx refill sent to patient pharmacy   

## 2014-12-26 ENCOUNTER — Encounter: Payer: Self-pay | Admitting: Cardiovascular Disease

## 2014-12-26 ENCOUNTER — Encounter: Payer: Self-pay | Admitting: Cardiology

## 2014-12-27 ENCOUNTER — Other Ambulatory Visit: Payer: Self-pay | Admitting: Cardiovascular Disease

## 2014-12-27 NOTE — Telephone Encounter (Signed)
Rx(s) sent to pharmacy electronically.  

## 2015-03-27 ENCOUNTER — Other Ambulatory Visit: Payer: Self-pay | Admitting: Cardiovascular Disease

## 2015-03-28 NOTE — Telephone Encounter (Signed)
Rx refill sent to patient pharmacy   

## 2015-04-22 ENCOUNTER — Other Ambulatory Visit: Payer: Self-pay | Admitting: Cardiovascular Disease

## 2015-04-24 NOTE — Telephone Encounter (Signed)
Rx has been sent to the pharmacy electronically. ° °

## 2015-06-08 ENCOUNTER — Ambulatory Visit (INDEPENDENT_AMBULATORY_CARE_PROVIDER_SITE_OTHER): Payer: Commercial Managed Care - HMO | Admitting: Cardiovascular Disease

## 2015-06-08 VITALS — BP 134/90 | HR 68 | Ht 62.0 in | Wt 164.7 lb

## 2015-06-08 DIAGNOSIS — M25472 Effusion, left ankle: Secondary | ICD-10-CM

## 2015-06-08 DIAGNOSIS — I771 Stricture of artery: Secondary | ICD-10-CM

## 2015-06-08 DIAGNOSIS — E039 Hypothyroidism, unspecified: Secondary | ICD-10-CM | POA: Diagnosis not present

## 2015-06-08 DIAGNOSIS — I10 Essential (primary) hypertension: Secondary | ICD-10-CM

## 2015-06-08 MED ORDER — METOPROLOL TARTRATE 25 MG PO TABS: ORAL_TABLET | ORAL | Status: DC

## 2015-06-08 MED ORDER — AMLODIPINE BESYLATE 10 MG PO TABS: 5.0000 mg | ORAL_TABLET | Freq: Every day | ORAL | Status: DC

## 2015-06-08 NOTE — Patient Instructions (Signed)
Your physician has recommended you make the following change in your medication: decrease your amlodipine to 5 mg daily. (1/2 of the 10 mg tablet).  Increase the lopressor to 2 tablets in the morning( 50 mg) and 1 tablet at night (25mg )  You have been referred back to your Primary care MD for follow up care.  Your physician wants you to follow-up in: 1 year or sooner if needed with Dr. Tresa Endo. You will receive a reminder letter in the mail two months in advance. If you don't receive a letter, please call our office to schedule the follow-up appointment.

## 2015-06-10 ENCOUNTER — Encounter: Payer: Self-pay | Admitting: Cardiovascular Disease

## 2015-06-10 DIAGNOSIS — M25473 Effusion, unspecified ankle: Secondary | ICD-10-CM | POA: Insufficient documentation

## 2015-06-10 NOTE — Progress Notes (Signed)
Patient ID: Gabriela Moyer, female   DOB: 08/03/1941, 74 y.o.   MRN: [id number]    HPI: Gabriela Moyer is a 74 y.o. female presents to the office for a 14 month cardiology evaluation.    Gabriela Moyer has a history of diffuse peripheral vascular disease involving her subclavian, axillary, brachial vessels as well as renal arteries. In July 2010 she underwent right renal artery stenting and at that time at 80 mm ostial gradient. 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 Additional problems also include hypertension as well as hyperlipidemia. She saw  Dr. Berry in April 2014 for followup of her peripheral vascular disease. In September 2014 she underwent recent Doppler study which again confirmed bilateral brachial pressures consistent with right upper extremity inflow disease. There was a large amount of diffuse atherosclerotic plaque with elevated velocities in the right distal subclavian and axillary arteries concordant with her known 70-99% diameter reduction. She also had moderate amount of diffuse atherosclerotic plaque with elevated velocities consistent with a 50-69% diameter reduction in her left distal subclavian/axillary and brachial arteries.  She has a history of hypothyroidism on Synthroid at low dose 25 mcg, as well as a history of hyperlipidemia, and has been tolerating Crestor 20 mg daily.  Laboratory in 11/2014 from Dr. [name] revealed normal renal function with a Cr 0.7.  Thyroid function was normal.  Hemoglobin A1c was excellent at 5.4.  There is no evidence for microalbuminuria cholesterol was very good with a total close to 153, triglycerides 137, HDL 54, LDL 72.  Non-HDL cholesterol 99.  Since I saw her over the past year, she has remained stable.  Specifically, she denies chest pain or shortness of breath.  She continues to do all her yard work without symptoms.  Does note some mild ankle swelling.  He has not been completely  restrictive of sodium intake.  She is unaware of claudication symptoms or arm paresthesias.  She denies dizziness.  She presents for evaluation.  Past Medical History  Diagnosis Date  . PVD (peripheral vascular disease)   . Hypertension   . Hyperlipidemia   . Subclavian arterial stenosis     Past Surgical History  Procedure Laterality Date  . Transthoracic echocardiogram  08/28/2009    EF=>55%; trace TR; mild AV regurg, mild pulm valve regurg  . Nm myocar perf wall motion  09/2009    bruce myoview - normal perfusion, EF 915, low risk scan  . Cardiac catheterization  11/03/2007    atherosclerotic right renal artery (Dr. J. Gangi)  . Renal artery doppler  04/2012    celiac artery with >50% diameter reduction; R renal artery stent w/mildly elevated velocities (1-59% diameter reduction)  . Upper extremity arterial doppler  04/2012    right brachial pressure 96mmHg, left 120mmHg; R distal subclavian & axillary arteries 70-99% diameter reduction; L distal subclavian/axillary & brachial arteries (50-69% diameter reduction)  . Carotid doppler  12/2010    R & L ICA with small amount fibrous plaque; R distal subclav. 70-99% diameter reduction; L mid subclav 50-69% diameter reduction  . Lower extremity arterial doppler  03/2008    right ABI - mild arterial insuff at rest; R CIA with less than 50% diameter reduction, R & L SFA with less than 50% diameter reduction  . Renal angioplasty  11/07/2009    5x15mm Cordis Genesis Aviator stent to ostium of right renal artery (Dr. J. Berry)    Allergies  Allergen Reactions  .   Codeine   . Contrast Media [Iodinated Diagnostic Agents]   . Morphine     Current Outpatient Prescriptions  Medication Sig Dispense Refill  . amLODipine (NORVASC) 10 MG tablet Take 0.5 tablets (5 mg total) by mouth daily. 45 tablet 3  . aspirin 81 MG tablet Take 81 mg by mouth daily.    . clopidogrel (PLAVIX) 75 MG tablet TAKE 1 TABLET BY MOUTH DAILY 90 tablet 0  .  hydrochlorothiazide (HYDRODIURIL) 25 MG tablet Take 25 mg by mouth daily.    . levothyroxine (SYNTHROID, LEVOTHROID) 25 MCG tablet Take 25 mcg by mouth daily before breakfast.    . metoprolol tartrate (LOPRESSOR) 25 MG tablet Take 2 tablets in the morning a 1 at night. 270 tablet 3  . rosuvastatin (CRESTOR) 20 MG tablet Take 20 mg by mouth daily.     No current facility-administered medications for this visit.    History   Social History  . Marital Status: Divorced    Spouse Name: N/A  . Number of Children: 2  . Years of Education: 12   Occupational History  . Not on file.   Social History Main Topics  . Smoking status: Passive Smoke Exposure - Never Smoker  . Smokeless tobacco: Never Used  . Alcohol Use: No  . Drug Use: Not on file  . Sexual Activity: Not on file   Other Topics Concern  . Not on file   Social History Narrative    Family History  Problem Relation Age of Onset  . Heart disease Mother   . Stroke Mother   . Cancer Father   . Heart Problems Maternal Grandmother   . Heart Problems Maternal Grandfather   . Cancer Brother    Social history is notable in that she is divorced. She has 2 children 2 grandchildren. Has no history of tobacco use. She does not use alcohol.  ROS General: Negative; No fevers, chills, or night sweats;  HEENT: Negative; No changes in vision or hearing, sinus congestion, difficulty swallowing Pulmonary: Positive for recent URI symptoms, which have now improved.; No cough, wheezing, shortness of breath, hemoptysis Cardiovascular: Negative; No chest pain, presyncope, syncope, palpatations GI: Negative; No nausea, vomiting, diarrhea, or abdominal pain GU: Negative; No dysuria, hematuria, or difficulty voiding Musculoskeletal: Negative; no myalgias, joint pain, or weakness Hematologic/Oncology: Negative; no easy bruising, bleeding Endocrine: Positive for hypothyroidism.  Negative for diabetes.; no heat/cold intolerance Neuro: Negative;  no changes in balance, headaches Skin: Negative; No rashes or skin lesions Psychiatric: Negative; No behavioral problems, depression Sleep: Negative; No snoring, daytime sleepiness, hypersomnolence, bruxism, restless legs, hypnogognic hallucinations, no cataplexy Other comprehensive 14 point system review is negative   PE BP 134/90 mmHg  Pulse 68  Ht 5' 2" (1.575 m)  Wt 74.707 kg (164 lb 11.2 oz)  BMI 30.12 kg/m2  Blood pressure in her left arm recheck by me was 150/80. Blood pressure right arm is 120/70 General: Alert, oriented, no distress.  Skin: normal turgor, no rashes HEENT: Normocephalic, atraumatic. Pupils round and reactive; sclera anicteric;no lid lag.  Nose without nasal septal hypertrophy Mouth/Parynx benign; Mallinpatti scale 3 Neck: No JVD, bilateral subclavian bruits right greater than left Chest wall: Nontender to palpation Lungs: clear to ausculatation and percussion; no wheezing or rales Heart: RRR, s1 s2 normal 1/6 systolic murmur; oh diastolic murmur.  No rubs thrills or heaves.. Abdomen: soft, nontender; no hepatosplenomehaly, BS+; abdominal aorta nontender and not dilated by palpation. Pulses: bilateral subclavian bruits right greater than left. Back: No   CVA tenderness Musculoskeletal: No joint tenderness Extremities:  ankle swelling, left greater than right;  no clubbing cyanosis, Homan's sign negative  Neurologic: grossly nonfocal Psychologic: normal affect and mood.  ECG (independently read by me): Normal sinus rhythm at 68 bpm.  No ectopy.  Normal intervals.  Poor progression V1 through V3.  ECG (independently read by me): Sinus rhythm 73 beats per minute.  No significant ST-T changes.  Mild early repolarization changes.  Prior 10/21/2013 ECG: Sinus rhythm at 73 beats per minute. Poor progression precordially. Mild J-point early repolarization changes inferolaterally  LABS:  BMET    Component Value Date/Time   NA 139 11/08/2009 0425   K 3.6  11/08/2009 0425   CL 105 11/08/2009 0425   CO2 28 11/08/2009 0425   GLUCOSE 143* 11/08/2009 0425   BUN 24* 11/08/2009 0425   CREATININE 0.99 11/08/2009 0425   CALCIUM 9.0 11/08/2009 0425   GFRNONAA 56* 11/08/2009 0425   GFRAA  11/08/2009 0425    >60        The eGFR has been calculated using the MDRD equation. This calculation has not been validated in all clinical situations. eGFR's persistently <60 mL/min signify possible Chronic Kidney Disease.     Hepatic Function Panel  No results found for: PROT   CBC    Component Value Date/Time   WBC 10.8* 11/08/2009 0425   RBC 3.53* 11/08/2009 0425   HGB 11.3* 11/08/2009 0425   HCT 32.6* 11/08/2009 0425   PLT 254 11/08/2009 0425   MCV 92.3 11/08/2009 0425   MCHC 34.6 11/08/2009 0425   RDW 12.4 11/08/2009 0425     BNP No results found for: PROBNP  Lipid Panel  No results found for: CHOL   RADIOLOGY: No results found.    ASSESSMENT AND PLAN: Ms. Lynda Moyer is now 74 years old and remains active without restriction to her yard work or exercise.  She has a history of diffuse peripheral vascular disease and is status post stenting of a right renal artery November 2010. She has had followup duplex imaging and this has remained stable. She continues to be fairly symptom-free with reference to her right subclavian artery stenosis.  She has had issues with left ankle swelling.  She has not been strict with reference to sodium intake.  She continues to have a blood pressure differential in her arms as noted above.  Presently, I am decreasing her amlodipine from 10 mg to 5 mg a to me help her leg swelling.  I will further titrate her beta blocker such that she will take 50 mg in the morning and 25 mg in the night for her present dose of 25 mg twice a day.  I suggested over-the-counter support stockings.  She will be following up with Dr. Tisoivic.  I will see her in one year for follow-up evaluation.    Time spent: 25  minutes  Thomas A. Kelly, MD, FACC  06/10/2015 2:21 PM    

## 2015-07-22 ENCOUNTER — Other Ambulatory Visit: Payer: Self-pay | Admitting: Cardiovascular Disease

## 2015-07-24 NOTE — Telephone Encounter (Signed)
Rx(s) sent to pharmacy electronically.  

## 2015-08-14 ENCOUNTER — Encounter: Payer: Self-pay | Admitting: Internal Medicine

## 2015-12-19 ENCOUNTER — Other Ambulatory Visit: Payer: Self-pay | Admitting: Cardiovascular Disease

## 2016-07-20 ENCOUNTER — Other Ambulatory Visit: Payer: Self-pay | Admitting: Cardiovascular Disease

## 2016-07-23 ENCOUNTER — Telehealth: Payer: Self-pay | Admitting: Cardiovascular Disease

## 2016-07-23 DIAGNOSIS — E785 Hyperlipidemia, unspecified: Secondary | ICD-10-CM

## 2016-07-23 NOTE — Telephone Encounter (Signed)
Will route to pharmD for options on DAW Crestor or other med, patient sched to see Dr. Tresa Endo 9/12.

## 2016-07-23 NOTE — Telephone Encounter (Signed)
Pt says she can not take the generic Crestor,it makes her ache all over. Please advise something else for her to take . Please let her know what is called in.Please call this to [phone].

## 2016-07-23 NOTE — Telephone Encounter (Signed)
Left msg for patient to call. 

## 2016-07-23 NOTE — Telephone Encounter (Signed)
Go ahead and send in DAW 1 for brand Crestor.  She can discuss other options based on her copay when she sees Dr. Tresa Endo in September

## 2016-07-23 NOTE — Telephone Encounter (Signed)
Patient home line busy when dialed.

## 2016-07-24 MED ORDER — ATORVASTATIN CALCIUM 40 MG PO TABS: 40.0000 mg | ORAL_TABLET | Freq: Every day | ORAL | 5 refills | Status: DC

## 2016-07-24 NOTE — Telephone Encounter (Signed)
Returned call to patient. She states that brand name crestor is going to cost her $300/month.   Routed to MD/pharmacist for further recommendations on statin therapy

## 2016-07-24 NOTE — Telephone Encounter (Signed)
See if she is willing to try atorvastatin 40 mg qd.  If so, have her repeat lipid labs in 3 months.

## 2016-07-24 NOTE — Telephone Encounter (Signed)
Follow-Up,  Pt returning call. wy

## 2016-07-24 NOTE — Telephone Encounter (Signed)
Spoke with patient and communicated advice per pharmacist. She agreed with plan. Med sent to pharmacy. Lab slip for lipid panel mailed to patient.

## 2016-07-30 NOTE — Telephone Encounter (Signed)
ok 

## 2016-09-10 ENCOUNTER — Encounter: Payer: Self-pay | Admitting: Cardiovascular Disease

## 2016-09-10 ENCOUNTER — Ambulatory Visit (INDEPENDENT_AMBULATORY_CARE_PROVIDER_SITE_OTHER): Payer: Commercial Managed Care - HMO | Admitting: Cardiovascular Disease

## 2016-09-10 VITALS — BP 136/88 | HR 80 | Ht 62.0 in | Wt 162.0 lb

## 2016-09-10 DIAGNOSIS — I679 Cerebrovascular disease, unspecified: Secondary | ICD-10-CM | POA: Diagnosis not present

## 2016-09-10 DIAGNOSIS — E785 Hyperlipidemia, unspecified: Secondary | ICD-10-CM | POA: Diagnosis not present

## 2016-09-10 DIAGNOSIS — I771 Stricture of artery: Secondary | ICD-10-CM

## 2016-09-10 DIAGNOSIS — I701 Atherosclerosis of renal artery: Secondary | ICD-10-CM | POA: Diagnosis not present

## 2016-09-10 DIAGNOSIS — I739 Peripheral vascular disease, unspecified: Secondary | ICD-10-CM

## 2016-09-10 DIAGNOSIS — I10 Essential (primary) hypertension: Secondary | ICD-10-CM | POA: Diagnosis not present

## 2016-09-10 NOTE — Patient Instructions (Signed)
Testing/Procedures:  Your physician has requested that you have a carotid duplex. This test is an ultrasound of the carotid arteries in your neck. It looks at blood flow through these arteries that supply the brain with blood. Allow one hour for this exam. There are no restrictions or special instructions.   Your physician has requested that you have a renal artery duplex. During this test, an ultrasound is used to evaluate blood flow to the kidneys. Allow one hour for this exam. Do not eat after midnight the day before and avoid carbonated beverages. Take your medications as you usually do.    Follow-Up:  Your physician wants you to follow-up in: ONE YEAR WITH DR Carmin RichmondKELLY You will receive a reminder letter in the mail two months in advance. If you don't receive a letter, please call our office to schedule the follow-up appointment.   If you need a refill on your cardiac medications before your next appointment, please call your pharmacy.

## 2016-09-12 NOTE — Progress Notes (Signed)
Patient ID: Gabriela Moyer, female   DOB: 20-Oct-1941, 75 y.o.   MRN: [id number]    PCP: Dr. Rosana Hoes  HPI: Gabriela Moyer is a 75 y.o. female presents to the office for a 15 month cardiology evaluation.    Gabriela Moyer has a history of diffuse peripheral vascular disease involving her subclavian, axillary, brachial vessels as well as renal arteries. In July 2010 she underwent right renal artery stenting and at that time at 80 mm ostial gradient. 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 Additional problems also include hypertension as well as hyperlipidemia. She saw  Dr. Gwenlyn Found in April 2014 for followup of her peripheral vascular disease. In September 2014 she underwent recent Doppler study which again confirmed bilateral brachial pressures consistent with right upper extremity inflow disease. There was a large amount of diffuse atherosclerotic plaque with elevated velocities in the right distal subclavian and axillary arteries concordant with her known 70-99% diameter reduction. She also had moderate amount of diffuse atherosclerotic plaque with elevated velocities consistent with a 50-69% diameter reduction in her left distal subclavian/axillary and brachial arteries.  She has a history of hypothyroidism on Synthroid at low dose 25 mcg, as well as a history of hyperlipidemia, and has been tolerating Crestor 20 mg daily.  Laboratory in 11/2014 from Dr. Osborne Casco revealed normal renal function with a Cr 0.7.  Thyroid function was normal.  Hemoglobin A1c was excellent at 5.4.  There is no evidence for microalbuminuria cholesterol was very good with a total close to 153, triglycerides 137, HDL 54, LDL 72.  Non-HDL cholesterol 99.  Since I last saw her she has remained stable.  Specifically, she denies chest pain or shortness of breath.  She continues to do all her yard work without symptoms.  She denies any numbness or tingling.  He denies any neurologic  symptoms.  She is unaware of palpitations.  She presents for reevaluation.  Past Medical History:  Diagnosis Date  . Hyperlipidemia   . Hypertension   . PVD (peripheral vascular disease) ([place])   . 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 CARDIAC CATHETERIZATION  11/03/2007   atherosclerotic right renal artery (Dr. Jackie Plum)  . Carotid Doppler  12/2010   R & L ICA with small amount fibrous plaque; R distal subclav. 70-99% diameter reduction; L mid subclav 50-69% diameter reduction  . Lower Extremity Arterial Doppler  03/2008   right ABI - mild arterial insuff at rest; R CIA with less than 50% diameter reduction, R & L SFA with less than 50% diameter reduction  . NM MYOCAR PERF WALL MOTION  09/2009   bruce myoview - normal perfusion, EF 915, low risk scan  . RENAL ANGIOPLASTY  11/07/2009   5x71m Cordis Genesis Aviator stent to ostium of right renal artery (Dr. JAdora Fridge  . Renal Artery Doppler  04/2012   celiac artery with >50% diameter reduction; R renal artery stent w/mildly elevated velocities (1-59% diameter reduction)  . TRANSTHORACIC ECHOCARDIOGRAM  08/28/2009   EF=>55%; trace TR; mild AV regurg, mild pulm valve regurg  . Upper Extremity Arterial Doppler  04/2012   right brachial pressure 966mg, left 12016m; R distal subclavian & axillary arteries 70-99% diameter reduction; L distal subclavian/axillary & brachial arteries (50-69% diameter reduction)    Allergies  Allergen Reactions  . Codeine   . Contrast Media [Iodinated Diagnostic Agents]   . Morphine  Current Outpatient Prescriptions  Medication Sig Dispense Refill  . amLODipine (NORVASC) 10 MG tablet Take 0.5 tablets (5 mg total) by mouth daily. 45 tablet 3  . aspirin 81 MG tablet Take 81 mg by mouth daily.    Marland Kitchen atorvastatin (LIPITOR) 40 MG tablet Take 1 tablet (40 mg total) by mouth daily. 30 tablet 5  . clopidogrel (PLAVIX) 75 MG tablet TAKE 1 TABLET BY MOUTH DAILY  90 tablet 3  . hydrochlorothiazide (HYDRODIURIL) 25 MG tablet Take 25 mg by mouth daily.    Marland Kitchen levothyroxine (SYNTHROID, LEVOTHROID) 25 MCG tablet Take 25 mcg by mouth daily before breakfast.    . metoprolol tartrate (LOPRESSOR) 25 MG tablet TAKE 2 TABLETS BY MOUTH EVERY MORNING AND 1 TABLET BY MOUTH AT NIGHT 270 tablet 0   No current facility-administered medications for this visit.     Social History   Social History  . Marital status: Divorced    Spouse name: N/A  . Number of children: 2  . Years of education: 12   Occupational History  . Not on file.   Social History Main Topics  . Smoking status: Passive Smoke Exposure - Never Smoker  . Smokeless tobacco: Never Used  . Alcohol use No  . Drug use: Unknown  . Sexual activity: Not on file   Other Topics Concern  . Not on file   Social History Narrative  . No narrative on file    Family History  Problem Relation Age of Onset  . Heart disease Mother   . Stroke Mother   . Cancer Father   . Heart Problems Maternal Grandmother   . Heart Problems Maternal Grandfather   . Cancer Brother    Social history is notable in that she is divorced. She has 2 children 2 grandchildren. Has no history of tobacco use. She does not use alcohol.  ROS General: Negative; No fevers, chills, or night sweats;  HEENT: Negative; No changes in vision or hearing, sinus congestion, difficulty swallowing Pulmonary: Positive for recent URI symptoms, which have now improved.; No cough, wheezing, shortness of breath, hemoptysis Cardiovascular: Negative; No chest pain, presyncope, syncope, palpatations GI: Negative; No nausea, vomiting, diarrhea, or abdominal pain GU: Negative; No dysuria, hematuria, or difficulty voiding Musculoskeletal: Negative; no myalgias, joint pain, or weakness Hematologic/Oncology: Negative; no easy bruising, bleeding Endocrine: Positive for hypothyroidism.  Negative for diabetes.; no heat/cold intolerance Neuro: Negative;  no changes in balance, headaches Skin: Negative; No rashes or skin lesions Psychiatric: Negative; No behavioral problems, depression Sleep: Negative; No snoring, daytime sleepiness, hypersomnolence, bruxism, restless legs, hypnogognic hallucinations, no cataplexy Other comprehensive 14 point system review is negative   PE BP 136/88   Pulse 80   Ht 5' 2"  (1.575 m)   Wt 162 lb (73.5 kg)   BMI 29.63 kg/m    Blood pressure in her left arm recheck by me was 122/76; Blood pressure right arm is 124/76 General: Alert, oriented, no distress.  Skin: normal turgor, no rashes HEENT: Normocephalic, atraumatic. Pupils round and reactive; sclera anicteric;no lid lag.  Nose without nasal septal hypertrophy Mouth/Parynx benign; Mallinpatti scale 3 Neck: No JVD, bilateral subclavian bruits right greater than left Chest wall: Nontender to palpation Lungs: clear to ausculatation and percussion; no wheezing or rales Heart: RRR, s1 s2 normal 1/6 systolic murmur; oh diastolic murmur.  No rubs thrills or heaves.. Abdomen: soft, nontender; no hepatosplenomehaly, BS+; abdominal aorta nontender and not dilated by palpation. Pulses: bilateral subclavian bruits right greater than left. Back: No  CVA tenderness Musculoskeletal: No joint tenderness Extremities:  ankle swelling, left greater than right;  no clubbing cyanosis, Homan's sign negative  Neurologic: grossly nonfocal Psychologic: normal affect and mood.  ECG (independently read by me): Sinus rhythm at 80 bpm.  Poor anterior R-wave progression.  Normal intervals.  June 2016 ECG (independently read by me): Normal sinus rhythm at 68 bpm.  No ectopy.  Normal intervals.  Poor progression V1 through V3.  ECG (independently read by me): Sinus rhythm 73 beats per minute.  No significant ST-T changes.  Mild early repolarization changes.  Prior 10/21/2013 ECG: Sinus rhythm at 73 beats per minute. Poor progression precordially. Mild J-point early repolarization  changes inferolaterally  LABS:    Component Value Date/Time   NA 139 11/08/2009 0425   K 3.6 11/08/2009 0425   CL 105 11/08/2009 0425   CO2 28 11/08/2009 0425   GLUCOSE 143 (H) 11/08/2009 0425   BUN 24 (H) 11/08/2009 0425   CREATININE 0.99 11/08/2009 0425   CALCIUM 9.0 11/08/2009 0425   GFRNONAA 56 (L) 11/08/2009 0425   GFRAA  11/08/2009 0425    >60        The eGFR has been calculated using the MDRD equation. This calculation has not been validated in all clinical situations. eGFR's persistently <60 mL/min signify possible Chronic Kidney Disease.     Hepatic Function Panel  No results found for: PROT     Component Value Date/Time   WBC 10.8 (H) 11/08/2009 0425   RBC 3.53 (L) 11/08/2009 0425   HGB 11.3 (L) 11/08/2009 0425   HCT 32.6 (L) 11/08/2009 0425   PLT 254 11/08/2009 0425   MCV 92.3 11/08/2009 0425   MCHC 34.6 11/08/2009 0425   RDW 12.4 11/08/2009 0425     BNP No results found for: PROBNP  Lipid Panel  No results found for: CHOL   RADIOLOGY: No results found.    ASSESSMENT AND PLAN: Gabriela Moyer is a 75 year old Caucasian female who remains active without restriction to her yard work or exercise.  She has a history of diffuse peripheral vascular disease and is status post stenting of a right renal artery November 2010. She has had followup duplex imaging and this has remained stable.  Her last renal duplex was in September 2015.  Her last carotid ultrasound was in 2012.  When I last saw her, she had lower extremity edema and I reduced her amlodipine dose, but further titrate her beta blocker therapy.  Blood pressure today is stable without significant friends in her right and left arms.  She has been maintained on amlodipine 5 mg, and metoprolol 50 mg in the morning, 25 mg in the evening in addition to her HCTZ 25 mg daily.  She is on dual antiplatelet therapy with aspirin 81 mg and Plavix 75 mg.  She has a history of hypothyroidism on very low-dose  thyroid replacement at 25 g.  She tells me blood work will be done by Dr. Rosana Hoes.  I'm scheduling her for follow-up renal duplex study, as well as a follow-up carotid an upper extremity Doppler evaluation.  I will contact her regarding these results and if stable, I will see her in one year for reevaluation.    Time spent: 25 minutes  Troy Sine, MD, [facility]  09/12/2016 5:07 PM

## 2016-09-23 ENCOUNTER — Ambulatory Visit (HOSPITAL_COMMUNITY)
Admission: RE | Admit: 2016-09-23 | Discharge: 2016-09-23 | Disposition: A | Discharge status: Home / Self Care | Payer: Commercial Managed Care - HMO | Source: Ambulatory Visit | Attending: Cardiovascular Disease | Admitting: Cardiovascular Disease

## 2016-09-23 DIAGNOSIS — I10 Essential (primary) hypertension: Secondary | ICD-10-CM | POA: Diagnosis not present

## 2016-09-23 DIAGNOSIS — I6523 Occlusion and stenosis of bilateral carotid arteries: Secondary | ICD-10-CM | POA: Insufficient documentation

## 2016-09-23 DIAGNOSIS — I700 Atherosclerosis of aorta: Secondary | ICD-10-CM | POA: Insufficient documentation

## 2016-09-23 DIAGNOSIS — I701 Atherosclerosis of renal artery: Secondary | ICD-10-CM | POA: Diagnosis not present

## 2016-09-23 DIAGNOSIS — E785 Hyperlipidemia, unspecified: Secondary | ICD-10-CM | POA: Diagnosis not present

## 2016-09-23 DIAGNOSIS — I679 Cerebrovascular disease, unspecified: Secondary | ICD-10-CM | POA: Diagnosis not present

## 2016-10-10 ENCOUNTER — Telehealth: Payer: Self-pay | Admitting: *Deleted

## 2016-10-10 NOTE — Telephone Encounter (Signed)
-----   Message from Lennette Biharihomas A Kelly, MD sent at 10/06/2016  8:20 PM EDT ----- Small caliber aorta with atherosclerosis, without focal dilatation. Normal bilateral kidney size. > 60% stenosis right renal artery. Normal left renal artery. Patent right renal artery stent. Patent IVC and renal veins.  Stable f/u in 1 year

## 2016-10-10 NOTE — Telephone Encounter (Signed)
Called and gave patient renal doppler results. She voiced verbal understanding.

## 2016-10-16 ENCOUNTER — Other Ambulatory Visit: Payer: Self-pay | Admitting: Cardiovascular Disease

## 2016-10-17 NOTE — Telephone Encounter (Signed)
REFILL 

## 2016-10-18 ENCOUNTER — Other Ambulatory Visit: Payer: Self-pay | Admitting: *Deleted

## 2016-10-18 ENCOUNTER — Telehealth: Payer: Self-pay | Admitting: Cardiovascular Disease

## 2016-10-18 MED ORDER — METOPROLOL TARTRATE 25 MG PO TABS: ORAL_TABLET | ORAL | 3 refills | Status: DC

## 2016-10-23 ENCOUNTER — Telehealth: Payer: Self-pay | Admitting: *Deleted

## 2016-10-23 NOTE — Telephone Encounter (Signed)
New message   Pt verbalized that she is calling for the rn she did not want to disclose any information

## 2016-10-23 NOTE — Telephone Encounter (Signed)
Results of carotid US discussed, pt voiced understanding and thanks.

## 2016-10-23 NOTE — Telephone Encounter (Signed)
-----   Message from Lennette Biharihomas A Kelly, MD sent at 10/21/2016  8:09 AM EDT ----- Very mild carotid plaque.  Follow-up PRN.

## 2016-11-26 ENCOUNTER — Other Ambulatory Visit: Payer: Self-pay | Admitting: Cardiovascular Disease

## 2016-11-27 MED ORDER — AMLODIPINE BESYLATE 10 MG PO TABS: 5.0000 mg | ORAL_TABLET | Freq: Every day | ORAL | 3 refills | Status: DC

## 2016-12-18 ENCOUNTER — Inpatient Hospital Stay (HOSPITAL_COMMUNITY): Payer: Commercial Managed Care - HMO

## 2016-12-18 ENCOUNTER — Encounter (HOSPITAL_COMMUNITY): Payer: Self-pay | Admitting: Emergency Medicine

## 2016-12-18 ENCOUNTER — Inpatient Hospital Stay (HOSPITAL_COMMUNITY)
Admission: EM | Admit: 2016-12-18 | Discharge: 2016-12-19 | DRG: 310 | Disposition: A | Discharge status: Home / Self Care | Payer: Commercial Managed Care - HMO | Attending: Cardiology | Admitting: Cardiology

## 2016-12-18 ENCOUNTER — Emergency Department (HOSPITAL_COMMUNITY): Payer: Commercial Managed Care - HMO

## 2016-12-18 DIAGNOSIS — Z7902 Long term (current) use of antithrombotics/antiplatelets: Secondary | ICD-10-CM | POA: Diagnosis not present

## 2016-12-18 DIAGNOSIS — Z8249 Family history of ischemic heart disease and other diseases of the circulatory system: Secondary | ICD-10-CM

## 2016-12-18 DIAGNOSIS — Z79899 Other long term (current) drug therapy: Secondary | ICD-10-CM | POA: Diagnosis not present

## 2016-12-18 DIAGNOSIS — E039 Hypothyroidism, unspecified: Secondary | ICD-10-CM | POA: Diagnosis present

## 2016-12-18 DIAGNOSIS — E876 Hypokalemia: Secondary | ICD-10-CM | POA: Diagnosis present

## 2016-12-18 DIAGNOSIS — I4891 Unspecified atrial fibrillation: Secondary | ICD-10-CM | POA: Diagnosis not present

## 2016-12-18 DIAGNOSIS — I739 Peripheral vascular disease, unspecified: Secondary | ICD-10-CM | POA: Diagnosis present

## 2016-12-18 DIAGNOSIS — I480 Paroxysmal atrial fibrillation: Principal | ICD-10-CM | POA: Diagnosis present

## 2016-12-18 DIAGNOSIS — R6884 Jaw pain: Secondary | ICD-10-CM

## 2016-12-18 DIAGNOSIS — E7800 Pure hypercholesterolemia, unspecified: Secondary | ICD-10-CM | POA: Diagnosis present

## 2016-12-18 DIAGNOSIS — I10 Essential (primary) hypertension: Secondary | ICD-10-CM | POA: Diagnosis present

## 2016-12-18 DIAGNOSIS — Z960 Presence of urogenital implants: Secondary | ICD-10-CM | POA: Diagnosis present

## 2016-12-18 DIAGNOSIS — Z91041 Radiographic dye allergy status: Secondary | ICD-10-CM

## 2016-12-18 DIAGNOSIS — Z7982 Long term (current) use of aspirin: Secondary | ICD-10-CM | POA: Diagnosis not present

## 2016-12-18 DIAGNOSIS — E785 Hyperlipidemia, unspecified: Secondary | ICD-10-CM | POA: Diagnosis present

## 2016-12-18 DIAGNOSIS — Z885 Allergy status to narcotic agent status: Secondary | ICD-10-CM

## 2016-12-18 LAB — BASIC METABOLIC PANEL
Anion gap: 15 (ref 5–15)
BUN: 23 mg/dL — AB (ref 6–20)
CO2: 24 mmol/L (ref 22–32)
CREATININE: 0.92 mg/dL (ref 0.44–1.00)
Calcium: 9.5 mg/dL (ref 8.9–10.3)
Chloride: 100 mmol/L — ABNORMAL LOW (ref 101–111)
GFR calc Af Amer: >60 mL/min (ref >60)
GFR, EST NON AFRICAN AMERICAN: 59 mL/min — AB (ref >60)
GLUCOSE: 145 mg/dL — AB (ref 65–99)
Potassium: 3 mmol/L — ABNORMAL LOW (ref 3.5–5.1)
SODIUM: 139 mmol/L (ref 135–145)

## 2016-12-18 LAB — CBC
HEMATOCRIT: 43 % (ref 36.0–46.0)
Hemoglobin: 14.5 g/dL (ref 12.0–15.0)
MCH: 30.2 pg (ref 26.0–34.0)
MCHC: 33.7 g/dL (ref 30.0–36.0)
MCV: 89.6 fL (ref 78.0–100.0)
PLATELETS: 276 10*3/uL (ref 150–400)
RBC: 4.8 MIL/uL (ref 3.87–5.11)
RDW: 12.7 % (ref 11.5–15.5)
WBC: 7.7 10*3/uL (ref 4.0–10.5)

## 2016-12-18 LAB — TROPONIN I
TROPONIN I: 0.03 ng/mL — AB (ref <0.03)
TROPONIN I: 0.03 ng/mL — AB (ref <0.03)
Troponin I: 0.03 ng/mL (ref <0.03)

## 2016-12-18 LAB — ECHOCARDIOGRAM COMPLETE
Height: 62 in
WEIGHTICAEL: 2544 [oz_av]

## 2016-12-18 LAB — I-STAT TROPONIN, ED
Troponin i, poc: 0.02 ng/mL (ref 0.00–0.08)
Troponin i, poc: 0.03 ng/mL (ref 0.00–0.08)

## 2016-12-18 LAB — TSH: TSH: 6.411 u[IU]/mL — AB (ref 0.350–4.500)

## 2016-12-18 LAB — HEPARIN LEVEL (UNFRACTIONATED): Heparin Unfractionated: 0.82 IU/mL — ABNORMAL HIGH (ref 0.30–0.70)

## 2016-12-18 MED ORDER — DILTIAZEM HCL 25 MG/5ML IV SOLN
10.0000 mg | Freq: Once | INTRAVENOUS | Status: AC
  Administered 2016-12-18: 10 mg via INTRAVENOUS
  Filled 2016-12-18: qty 5

## 2016-12-18 MED ORDER — ATORVASTATIN CALCIUM 40 MG PO TABS
40.0000 mg | ORAL_TABLET | Freq: Every day | ORAL | Status: DC
  Administered 2016-12-18 – 2016-12-19 (×2): 40 mg via ORAL
  Filled 2016-12-18 (×2): qty 1

## 2016-12-18 MED ORDER — METOPROLOL TARTRATE 12.5 MG HALF TABLET
12.5000 mg | ORAL_TABLET | Freq: Two times a day (BID) | ORAL | Status: DC
  Administered 2016-12-18: 12.5 mg via ORAL
  Filled 2016-12-18: qty 1

## 2016-12-18 MED ORDER — ONDANSETRON HCL 4 MG/2ML IJ SOLN: 4.0000 mg | Freq: Four times a day (QID) | INTRAMUSCULAR | Status: DC | PRN

## 2016-12-18 MED ORDER — HEPARIN (PORCINE) IN NACL 100-0.45 UNIT/ML-% IJ SOLN
900.0000 [IU]/h | INTRAMUSCULAR | Status: DC
  Administered 2016-12-18: 1050 [IU]/h via INTRAVENOUS
  Filled 2016-12-18: qty 250

## 2016-12-18 MED ORDER — POTASSIUM CHLORIDE CRYS ER 20 MEQ PO TBCR
40.0000 meq | EXTENDED_RELEASE_TABLET | Freq: Once | ORAL | Status: AC
  Administered 2016-12-18: 40 meq via ORAL
  Filled 2016-12-18: qty 2

## 2016-12-18 MED ORDER — CLOPIDOGREL BISULFATE 75 MG PO TABS
75.0000 mg | ORAL_TABLET | Freq: Every day | ORAL | Status: DC
  Administered 2016-12-18 – 2016-12-19 (×2): 75 mg via ORAL
  Filled 2016-12-18 (×2): qty 1

## 2016-12-18 MED ORDER — DILTIAZEM HCL 100 MG IV SOLR
5.0000 mg/h | Freq: Once | INTRAVENOUS | Status: AC
  Administered 2016-12-18: 5 mg/h via INTRAVENOUS
  Filled 2016-12-18: qty 100

## 2016-12-18 MED ORDER — LEVOTHYROXINE SODIUM 25 MCG PO TABS
50.0000 ug | ORAL_TABLET | Freq: Every day | ORAL | Status: DC
  Administered 2016-12-18 – 2016-12-19 (×2): 50 ug via ORAL
  Filled 2016-12-18 (×3): qty 2

## 2016-12-18 MED ORDER — DEXTROSE 5 % IV SOLN
5.0000 mg/h | INTRAVENOUS | Status: DC
  Administered 2016-12-18: 5 mg/h via INTRAVENOUS
  Filled 2016-12-18: qty 100

## 2016-12-18 MED ORDER — ACETAMINOPHEN 325 MG PO TABS: 650.0000 mg | ORAL_TABLET | ORAL | Status: DC | PRN

## 2016-12-18 MED ORDER — HEPARIN BOLUS VIA INFUSION
3000.0000 [IU] | Freq: Once | INTRAVENOUS | Status: AC
  Administered 2016-12-18: 3000 [IU] via INTRAVENOUS
  Filled 2016-12-18: qty 3000

## 2016-12-18 NOTE — Care Management Note (Signed)
Case Management Note  Patient Details  Name: Gabriela Moyer MRN: [id number] Date of Birth: 1941-09-17  Subjective/Objective:                  75 y.o. 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 /From home alone.  Action/Plan: Follow for disposition needs. Leveda Anna/Possible new co-ag meds   Expected Discharge Date:  12/20/16               Expected Discharge Plan:  Home w Home Health Services  In-House Referral:  NA  Discharge planning Services  CM Consult, Medication Assistance (possible new anti-coag meds)   Status of Service:  In process, will continue to follow    Oletta CohnWood, Dymir Neeson, RN 12/18/2016, 8:47 AM

## 2016-12-18 NOTE — Progress Notes (Signed)
Paged cardiology Dr. Delton SeeNelson regarding patient on Cardizem gtt and HR 58 sustaining.  Advised to turn gtt off and MD will put orders in for PO medication. Pt is asleep will continue to monitor.

## 2016-12-18 NOTE — ED Notes (Addendum)
Nurse will draw labs. 

## 2016-12-18 NOTE — Progress Notes (Signed)
ANTICOAGULATION CONSULT NOTE   Pharmacy Consult for heparin Indication: atrial fibrillation  Allergies  Allergen Reactions  . Codeine Nausea And Vomiting    Abdominal pain  . Contrast Media [Iodinated Diagnostic Agents] Nausea And Vomiting    Abdominal pain Caused low blood pressure  . Morphine Nausea And Vomiting    Abdominal pain    Patient Measurements: Height: 5\' 2"  (157.5 cm) Weight: 165 lb (74.8 kg) IBW/kg (Calculated) : 50.1 Heparin Dosing Weight: 65.5  Vital Signs: Temp: 97.6 F (36.4 C) (12/20 1952) Temp Source: Oral (12/20 1952) BP: 134/44 (12/20 2000) Pulse Rate: 63 (12/20 2000)  Labs:  Recent Labs  12/18/16 0523 12/18/16 1209 12/18/16 1821 12/18/16 2058  HGB 14.5  --   --   --   HCT 43.0  --   --   --   PLT 276  --   --   --   HEPARINUNFRC  --   --   --  0.82*  CREATININE 0.92  --   --   --   TROPONINI  --  0.03* 0.03*  --     Estimated Creatinine Clearance: 49.3 mL/min (by C-G formula based on SCr of 0.92 mg/dL).   Medical History: Past Medical History:  Diagnosis Date  . Hyperlipidemia   . Hypertension   . PVD (peripheral vascular disease) ([facility])   . Subclavian arterial stenosis ([facility])    Assessment: Gabriela Moyer is a [age] yoF with a pmh of PVD, HTN, and HLD on (plavix PTA only) who presented 12/20 with palpitations and EKG showing rapid Afib (HR to 130s and troponin of 0.03.  Initial heparin level is higher than desired at 0.82 (goal 0.3 - 0.7) on heparin gtt at 1050 units/hr.  Goal of Therapy:  Heparin level 0.3-0.7 units/ml Monitor platelets by anticoagulation protocol: Yes   Plan:  1. Decrease heparin infusion to 900 units/hr 2. Recheck heparin level in am  3. Daily heparin level and CBC 4. Noted plans to transition to PO anticoagulation if does not require ischemic evaluation   Pollyann SamplesAndy Elsworth Ledin, PharmD, BCPS 12/18/2016, 9:58 PM Pager: [phone]

## 2016-12-18 NOTE — Progress Notes (Signed)
ANTICOAGULATION CONSULT NOTE - Initial Consult  Pharmacy Consult for heparin Indication: atrial fibrillation  Allergies  Allergen Reactions  . Codeine Nausea And Vomiting    Abdominal pain  . Contrast Media [Iodinated Diagnostic Agents] Nausea And Vomiting    Abdominal pain Caused low blood pressure  . Morphine Nausea And Vomiting    Abdominal pain    Patient Measurements: Height: 5\' 2"  (157.5 cm) Weight: 159 lb (72.1 kg) IBW/kg (Calculated) : 50.1 Heparin Dosing Weight: 65.5  Vital Signs: Temp: 97.5 F (36.4 C) (12/20 0515) Temp Source: Oral (12/20 0515) BP: 110/52 (12/20 1111) Pulse Rate: 77 (12/20 1111)  Labs:  Recent Labs  12/18/16 0523  HGB 14.5  HCT 43.0  PLT 276  CREATININE 0.92    Estimated Creatinine Clearance: 48.4 mL/min (by C-G formula based on SCr of 0.92 mg/dL).   Medical History: Past Medical History:  Diagnosis Date  . Hyperlipidemia   . Hypertension   . PVD (peripheral vascular disease) ([facility])   . Subclavian arterial stenosis ([facility])    Assessment: Gabriela Moyer is a [age] yoF with a pmh of PVD, HTN, and HLD on (plavix PTA only) who presented 12/20 with palpitations and EKG showing rapid Afib (HR to 130s). Hgb and PLTC wnl. Due to age and PTA plavix, will bolus conservatively.   Goal of Therapy:  Heparin level 0.3-0.7 units/ml Monitor platelets by anticoagulation protocol: Yes   Plan:  Give Heparin 3000 unit bolus Start heparin 1050 units/hr Check 8 hour heparin level Check daily CBC and heparin level Monitor for signs/symptoms of bleeding  Alfredo BachJoseph Arminger, BS, PharmD Clinical Pharmacy Resident [phone] (Pager) 12/18/2016 11:35 AM

## 2016-12-18 NOTE — ED Provider Notes (Signed)
[facility]-EMERGENCY DEPT Provider Note   CSN: [id number] Arrival date & time: 12/18/16  [id number]     History   Chief Complaint Chief Complaint  Patient presents with  . Atrial Fibrillation    HPI Gabriela Moyer is a 75 y.o. female.  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 Patient awoke from sleep with these symptoms approximately 3:45 AM today. She took an aspirin and a V8 juice for her symptoms without relief. She did not have any associated chest pains, diaphoresis, vomiting, lightheadedness or shortness of breath. No syncope. Patient has never had symptoms like this in the past. No history of heart attack or lung disease. She is currently on Plavix for her narrow arteries in her upper extremities. Sister had MI with atypical symptoms. The onset of this condition was acute. The course is constant. Aggravating factors: none. Alleviating factors: none.        Past Medical History:  Diagnosis Date  . Hyperlipidemia   . Hypertension   . PVD (peripheral vascular disease) (HCC)   . Subclavian arterial stenosis [facility](HCC)     Patient Active Problem List   Diagnosis Date Noted  . Ankle swelling 06/10/2015  . Hyperlipidemia LDL goal < 70 04/28/2014  . 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 Hypothyroidism 10/31/2013  . Essential hypertension 07/26/2008  . RENAL ARTERY STENOSIS 07/26/2008  . 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 CARDIAC CATHETERIZATION  11/03/2007   atherosclerotic right renal artery (Dr. Evlyn CourierJ. Gangi)  . Carotid Doppler  12/2010   R & L ICA with small amount fibrous plaque; R distal subclav. 70-99% diameter reduction; L mid  subclav 50-69% diameter reduction  . Lower Extremity Arterial Doppler  03/2008   right ABI - mild arterial insuff at rest; R CIA with less than 50% diameter reduction, R & L SFA with less than 50% diameter reduction  . NM MYOCAR PERF WALL MOTION  09/2009   bruce myoview - normal perfusion, EF 915, low risk scan  . RENAL ANGIOPLASTY  11/07/2009   [id number] Cordis Genesis Aviator stent to ostium of right renal artery (Dr. Erlene QuanJ. Berry)  . Renal Artery Doppler  04/2012   celiac artery with >50% diameter reduction; R renal artery stent w/mildly elevated velocities (1-59% diameter reduction)  . TRANSTHORACIC ECHOCARDIOGRAM  08/28/2009   EF=>55%; trace TR; mild AV regurg, mild pulm valve regurg  . Upper Extremity Arterial Doppler  04/2012   right brachial pressure 96mmHg, left 120mmHg; R distal subclavian & axillary arteries 70-99% diameter reduction; L distal subclavian/axillary & brachial arteries (50-69% diameter reduction)    OB History    No data available       Home Medications    Prior to Admission medications   Medication Sig Start Date End Date Taking? Authorizing Provider  amLODipine (NORVASC) 10 MG tablet Take 0.5 tablets (5 mg total) by mouth daily. 11/27/16   Lennette Biharihomas A Kelly, MD  aspirin 81 MG tablet Take 81 mg by mouth daily.    Historical Provider, MD  atorvastatin (LIPITOR) 40 MG tablet Take 1 tablet (40 mg total) by mouth daily. 07/24/16 10/22/16  Rosalee KaufmanKristin L Alvstad, RPH-CPP  clopidogrel (PLAVIX) 75 MG tablet TAKE 1 TABLET BY MOUTH DAILY 10/17/16  Lennette Biharihomas A Kelly, MD  hydrochlorothiazide (HYDRODIURIL) 25 MG tablet Take 25 mg by mouth daily.    Historical Provider, MD  levothyroxine (SYNTHROID, LEVOTHROID) 25 MCG tablet Take 25 mcg by mouth daily before breakfast.    Historical Provider, MD  metoprolol tartrate (LOPRESSOR) 25 MG tablet Patient takes 2 tablets by  mouth in the morning and one tablet by mouth at night 10/18/16   Lennette Biharihomas A Kelly, MD    Family History Family History    Problem Relation Age of Onset  . Heart disease Mother   . Stroke Mother   . Cancer Father   . Heart Problems Maternal Grandmother   . Heart Problems Maternal Grandfather   . Cancer Brother     Social History Social History  Substance Use Topics  . Smoking status: Passive Smoke Exposure - Never Smoker  . Smokeless tobacco: Never Used  . Alcohol use No     Allergies   Codeine; Contrast media [iodinated diagnostic agents]; and Morphine   Review of Systems Review of Systems  Constitutional: Negative for fever.  HENT: Negative for rhinorrhea and sore throat.        Jaw pain  Eyes: Negative for redness.  Respiratory: Negative for cough and shortness of breath.   Cardiovascular: Positive for palpitations. Negative for chest pain.  Gastrointestinal: Negative for abdominal pain, diarrhea, nausea and vomiting.  Genitourinary: Negative for dysuria.  Musculoskeletal: Negative for myalgias.  Skin: Negative for rash.  Neurological: Negative for headaches.     Physical Exam Updated Vital Signs BP 123/83   Pulse (!) 121   Temp 97.5 F (36.4 C) (Oral)   Resp 19   Ht 5\' 2"  (1.575 m)   Wt 72.1 kg   SpO2 98%   BMI 29.08 kg/m   Physical Exam  Constitutional: She appears well-developed and well-nourished.  HENT:  Head: Normocephalic and atraumatic.  Eyes: Conjunctivae are normal. Right eye exhibits no discharge. Left eye exhibits no discharge.  Neck: Normal range of motion. Neck supple.  Cardiovascular: Normal heart sounds.  An irregularly irregular rhythm present. Tachycardia present.   Pulmonary/Chest: Effort normal and breath sounds normal.  Abdominal: Soft. There is no tenderness.  Neurological: She is alert.  Skin: Skin is warm and dry.  Psychiatric: She has a normal mood and affect.  Nursing note and vitals reviewed.    ED Treatments / Results  Labs (all labs ordered are listed, but only abnormal results are displayed) Labs Reviewed  BASIC METABOLIC PANEL -  Abnormal; Notable for the following:       Result Value   Potassium 3.0 (*)    Chloride 100 (*)    Glucose, Bld 145 (*)    BUN 23 (*)    GFR calc non Af Amer 59 (*)    All other components within normal limits  TSH - Abnormal; Notable for the following:    TSH 6.411 (*)    All other components within normal limits  CBC  I-STAT TROPOININ, ED  I-STAT TROPOININ, ED    EKG  EKG Interpretation  Date/Time:  Wednesday December 18 2016 05:13:23 EST Ventricular Rate:  132 PR Interval:    QRS Duration: 72 QT Interval:  316 QTC Calculation: 472 R Axis:   63 Text Interpretation:  Atrial fibrillation Ventricular premature complex Repol abnrm suggests ischemia, diffuse leads Confirmed by Wilkie AyeHORTON  MD, COURTNEY ([id number]) on 12/18/2016 5:20:03 AM       Radiology Dg Chest 2 View  Result Date: 12/18/2016 CLINICAL DATA:  Initial evaluation for acute atrial fibrillation. EXAM: CHEST  2 VIEW COMPARISON:  Prior radiograph from 10/28/2007. FINDINGS: The cardiac and mediastinal silhouettes are stable in size and contour, and remain within normal limits. Mild aortic atherosclerosis. The lungs are normally inflated. No airspace consolidation, pleural effusion, or pulmonary edema is identified. There is no pneumothorax. No acute osseous abnormality identified. IMPRESSION: 1. No active cardiopulmonary disease. 2. Aortic atherosclerosis. Electronically Signed   By: Rise Mu M.D.   On: 12/18/2016 05:59    Procedures Procedures (including critical care time)  Medications Ordered in ED Medications - No data to display   Initial Impression / Assessment and Plan / ED Course  I have reviewed the triage vital signs and the nursing notes.  Pertinent labs & imaging results that were available during my care of the patient were reviewed by me and considered in my medical decision making (see chart for details).  Clinical Course    Patient seen and examined. D/w Dr. Wilkie Aye. Patient is stable  despite elevated HR. Ischemia evaluation started 2/2 associated jaw symptoms.   Vital signs reviewed and are as follows: BP 123/83   Pulse (!) 121   Temp 97.5 F (36.4 C) (Oral)   Resp 19   Ht 5\' 2"  (1.575 m)   Wt 72.1 kg   SpO2 98%   BMI 29.08 kg/m   Spoke with Dr. Allyson Sabal. Asks for hospitalist admit, consult inpatient if needed.   Spoke with Dr. Konrad Dolores. He has spoken with cardiology again. Cards to see and admit. Dr. Myrtis Ser is on.   9:27 AM Patient appears to have converted to NSR. Will repeat EKG.   10:14 AM Trop 0.02 --> 0.03. Repeat EKG shows some mild inferior ST-elevation. Does not meet STEMI criteria and pt without CP or anginal equivalent. Cards is seeing patient.   Final Clinical Impressions(s) / ED Diagnoses   Final diagnoses:  Atrial fibrillation with RVR (HCC)  Hypokalemia   Admit.   New Prescriptions New Prescriptions   No medications on file     Renne Crigler, PA-C 12/18/16 1610    Renne Crigler, PA-C 12/18/16 1014    Shon Baton, MD 12/22/16 [phone]

## 2016-12-18 NOTE — Progress Notes (Signed)
  Echocardiogram 2D Echocardiogram has been performed.  Cathie BeamsGREGORY, Sharda Keddy 12/18/2016, 2:31 PM

## 2016-12-18 NOTE — ED Triage Notes (Addendum)
Per EMS: Pt woken up out of sleep with a heart flutter. Pt called EMS b/c nothing has happened like it before. EMS determined it was afib. Pt no complaints of CP or SOB. This is the first time this has happened to the pt Pt is A&Ox4. Pt took an adult aspirin prior to EMS arriving

## 2016-12-18 NOTE — ED Notes (Signed)
Called in a Heart Healthy Diet, for patient.

## 2016-12-18 NOTE — H&P (Signed)
Cardiology H&P    Patient ID: Gabriela Moyer MRN: [id number], DOB/AGE: [birth year]   Admit date: 12/18/2016 Date of Consult: 12/18/2016  Primary Physician: Gaspar Garbeichard W [name], MD  Primary Cardiologist: Dr. Tresa EndoKelly   Patient Profile    [name] is a 75 year old female with a past medical history of diffuse peripheral vascular disease, HTN and HLD. She presented to the ED on 12/18/16 with palpitations, EKG showed rapid Afib.   History of Present Illness    [name] was awakened from sleep this morning with a funny feeling in her heart, not really palpitations but more like her heart was moving around. She also felt bilateral jaw pain and was slightly diaphoretic. She called EMS.   Upon arrival to the ED, she was found to rapid Afib with a rate of 132 bpm. She was started on a diltiazem gtt and converted to NSR. At the time of my encounter she feels much better, denies any palpitations and chest pain.   She is a patient of Dr. Tresa EndoKelly and was last seen for follow up in September of this year and was doing well. He repeated her carotid and renal doppler studies which were both stable. She has a family history of MI with her mother having heart diease and her sister recently had a MI a few months ago and had a stent placed to her LAD.   Her EKG after she converted to NSR shows ST elevation in her inferolateral leads, this is consistent with her prior EKG's and likely does not represent active ischemia.   She tells me that she has no history of Afib, and has never felt like she did this morning.   Past Medical History   Past Medical History:  Diagnosis Date  . Hyperlipidemia   . Hypertension   . PVD (peripheral vascular disease) (HCC)   . Subclavian arterial stenosis [facility](HCC)     Past Surgical History:  Procedure Laterality Date  . CARDIAC CATHETERIZATION  11/03/2007   atherosclerotic right renal artery (Dr. Evlyn CourierJ. Gangi)  . Carotid Doppler  12/2010   R & L ICA with small amount fibrous  plaque; R distal subclav. 70-99% diameter reduction; L mid subclav 50-69% diameter reduction  . Lower Extremity Arterial Doppler  03/2008   right ABI - mild arterial insuff at rest; R CIA with less than 50% diameter reduction, R & L SFA with less than 50% diameter reduction  . NM MYOCAR PERF WALL MOTION  09/2009   bruce myoview - normal perfusion, EF 915, low risk scan  . RENAL ANGIOPLASTY  11/07/2009   [id number] Cordis Genesis Aviator stent to ostium of right renal artery (Dr. Erlene QuanJ. Berry)  . Renal Artery Doppler  04/2012   celiac artery with >50% diameter reduction; R renal artery stent w/mildly elevated velocities (1-59% diameter reduction)  . TRANSTHORACIC ECHOCARDIOGRAM  08/28/2009   EF=>55%; trace TR; mild AV regurg, mild pulm valve regurg  . Upper Extremity Arterial Doppler  04/2012   right brachial pressure 96mmHg, left 120mmHg; R distal subclavian & axillary arteries 70-99% diameter reduction; L distal subclavian/axillary & brachial arteries (50-69% diameter reduction)     Allergies  Allergies  Allergen Reactions  . Codeine Nausea And Vomiting    Abdominal pain  . Contrast Media [Iodinated Diagnostic Agents] Nausea And Vomiting    Abdominal pain Caused low blood pressure  . Morphine Nausea And Vomiting    Abdominal pain    Inpatient Medications  Family History    Family History  Problem Relation Age of Onset  . Heart disease Mother   . Stroke Mother   . Cancer Father   . Heart Problems Maternal Grandmother   . Heart Problems Maternal Grandfather   . Cancer Brother     Social History    Social History   Social History  . Marital status: Divorced    Spouse name: N/A  . Number of children: 2  . Years of education: 12   Occupational History  . Not on file.   Social History Main Topics  . Smoking status: Passive Smoke Exposure - Never Smoker  . Smokeless tobacco: Never Used  . Alcohol use No  . Drug use: Unknown  . Sexual activity: Not on file   Other  Topics Concern  . Not on file   Social History Narrative  . No narrative on file     Review of Systems    General:  No chills, fever, night sweats or weight changes.  Cardiovascular:  No chest pain, dyspnea on exertion, edema, orthopnea, + palpitations, paroxysmal nocturnal dyspnea. Dermatological: No rash, lesions/masses Respiratory: No cough, dyspnea Urologic: No hematuria, dysuria Abdominal:   No nausea, vomiting, diarrhea, bright red blood per rectum, melena, or hematemesis Neurologic:  No visual changes, wkns, changes in mental status. All other systems reviewed and are otherwise negative except as noted above.  Physical Exam    Blood pressure (!) 145/103, pulse (!) 126, temperature 97.5 F (36.4 C), temperature source Oral, resp. rate 22, height 5\' 2"  (1.575 m), weight 159 lb (72.1 kg), SpO2 100 %.  General: Pleasant, NAD Psych: Normal affect. Neuro: Alert and oriented X 3. Moves all extremities spontaneously. HEENT: Normal  Neck: Supple without bruits or JVD. Lungs:  Resp regular and unlabored, CTA. Heart: RRR no s3, s4, or murmurs. Abdomen: Soft, non-tender, non-distended, BS + x 4.  Extremities: No clubbing, cyanosis or edema. DP/PT/Radials 2+ and equal bilaterally.  Labs    Troponin [facility](Point of Care Test)  Recent Labs  12/18/16 0650  TROPIPOC 0.02    Lab Results  Component Value Date   WBC 7.7 12/18/2016   HGB 14.5 12/18/2016   HCT 43.0 12/18/2016   MCV 89.6 12/18/2016   PLT 276 12/18/2016    Recent Labs Lab 12/18/16 0523  NA 139  K 3.0*  CL 100*  CO2 24  BUN 23*  CREATININE 0.92  CALCIUM 9.5  GLUCOSE 145*     Radiology Studies    Dg Chest 2 View  Result Date: 12/18/2016 CLINICAL DATA:  Initial evaluation for acute atrial fibrillation. EXAM: CHEST  2 VIEW COMPARISON:  Prior radiograph from 10/28/2007. FINDINGS: The cardiac and mediastinal silhouettes are stable in size and contour, and remain within normal limits. Mild aortic  atherosclerosis. The lungs are normally inflated. No airspace consolidation, pleural effusion, or pulmonary edema is identified. There is no pneumothorax. No acute osseous abnormality identified. IMPRESSION: 1. No active cardiopulmonary disease. 2. Aortic atherosclerosis. Electronically Signed   By: Rise MuBenjamin  McClintock M.D.   On: 12/18/2016 05:59    EKG & Cardiac Imaging    EKG: NSR with inferior ST elevation   Echocardiogram: pending   Assessment & Plan    1. Atrial fibrillation with RVR: Presented with palpitations and jaw pain. She converted to NSR on diltiazem gtt. Can transition to po diltiazem tomorrow.   This patients CHA2DS2-VASc Score and unadjusted Ischemic Stroke Rate (% per year) is equal to 4.8 % stroke rate/year  from a score of 4 Above score calculated as 1 point each if present [CHF, HTN, DM, Vascular=MI/PAD/Aortic Plaque, Age if 65-74, or Female], 2 points each if present [Age > 75, or Stroke/TIA/TE]  Would anticoagulate her as she has a history of diffuse PVD as documented by her primary Cardiologist. She is already on Plavix for this. Awaiting Echo, but likely nonvalvular Afib. Would favor starting Xarelto, but will start heparin gtt for now as she might need an ischemic evaluation this admission (depending on troponin and echo results).   2. Jaw pain with family history of CAD: Patient had some jaw pain and diaphoresis associated with her rapid Afib, also with risk factors for CAD including strong family history (mother and sister), PVD, and advanced age. She would benefit from an ischemic evaluation, depending on troponin value and echo results can defer this to outpatient if appropriate.   3. History of PVD: Followed by Dr. Allyson Sabal in the past. See discussion above.       Signed, Little Ishikawa, NP 12/18/2016, 9:49 AM Pager: [phone] Patient seen and examined. I agree with the assessment and plan as detailed above. See also my additional thoughts below.   The  patient presented after having rapid atrial fibrillation. I suspect that this is the primary issue. However she had some jaw pain. We'll observe her today checking troponins and following her clinical status.  Willa Rough, MD, [facility] 12/18/2016 11:34 AM

## 2016-12-19 ENCOUNTER — Telehealth: Payer: Self-pay | Admitting: Cardiovascular Disease

## 2016-12-19 LAB — CBC
HEMATOCRIT: 41.3 % (ref 36.0–46.0)
Hemoglobin: 13.7 g/dL (ref 12.0–15.0)
MCH: 30 pg (ref 26.0–34.0)
MCHC: 33.2 g/dL (ref 30.0–36.0)
MCV: 90.6 fL (ref 78.0–100.0)
Platelets: 261 10*3/uL (ref 150–400)
RBC: 4.56 MIL/uL (ref 3.87–5.11)
RDW: 13 % (ref 11.5–15.5)
WBC: 7.8 10*3/uL (ref 4.0–10.5)

## 2016-12-19 LAB — LIPID PANEL
Cholesterol: 129 mg/dL (ref 0–200)
HDL: 48 mg/dL
LDL Cholesterol: 65 mg/dL (ref 0–99)
Total CHOL/HDL Ratio: 2.7 ratio
Triglycerides: 79 mg/dL
VLDL: 16 mg/dL (ref 0–40)

## 2016-12-19 LAB — BASIC METABOLIC PANEL WITH GFR
Anion gap: 9 (ref 5–15)
BUN: 15 mg/dL (ref 6–20)
CO2: 27 mmol/L (ref 22–32)
Calcium: 9.6 mg/dL (ref 8.9–10.3)
Chloride: 105 mmol/L (ref 101–111)
Creatinine, Ser: 0.78 mg/dL (ref 0.44–1.00)
GFR calc Af Amer: >60 mL/min
GFR calc non Af Amer: >60 mL/min
Glucose, Bld: 103 mg/dL — ABNORMAL HIGH (ref 65–99)
Potassium: 3.7 mmol/L (ref 3.5–5.1)
Sodium: 141 mmol/L (ref 135–145)

## 2016-12-19 LAB — HEPARIN LEVEL (UNFRACTIONATED): Heparin Unfractionated: 1.07 [IU]/mL — ABNORMAL HIGH (ref 0.30–0.70)

## 2016-12-19 MED ORDER — DILTIAZEM HCL ER COATED BEADS 120 MG PO CP24
120.0000 mg | ORAL_CAPSULE | Freq: Every day | ORAL | Status: DC
  Administered 2016-12-19: 120 mg via ORAL
  Filled 2016-12-19: qty 1

## 2016-12-19 MED ORDER — HEPARIN (PORCINE) IN NACL 100-0.45 UNIT/ML-% IJ SOLN: 700.0000 [IU]/h | INTRAMUSCULAR | Status: AC

## 2016-12-19 MED ORDER — METOPROLOL TARTRATE 25 MG PO TABS
25.0000 mg | ORAL_TABLET | Freq: Two times a day (BID) | ORAL | Status: DC
  Administered 2016-12-19: 25 mg via ORAL
  Filled 2016-12-19: qty 1

## 2016-12-19 MED ORDER — RIVAROXABAN 20 MG PO TABS
20.0000 mg | ORAL_TABLET | Freq: Every day | ORAL | Status: DC
  Administered 2016-12-19: 20 mg via ORAL
  Filled 2016-12-19: qty 1

## 2016-12-19 MED ORDER — DILTIAZEM HCL ER COATED BEADS 120 MG PO CP24: 120.0000 mg | ORAL_CAPSULE | Freq: Every day | ORAL | 12 refills | Status: DC

## 2016-12-19 MED ORDER — RIVAROXABAN 20 MG PO TABS: 20.0000 mg | ORAL_TABLET | Freq: Every day | ORAL | 12 refills | Status: DC

## 2016-12-19 NOTE — Care Management Note (Addendum)
Case Management Note  Patient Details  Name: Gabriela CrazierLinda C Moyer MRN: [id number] Date of Birth: 25-Aug-1941  Subjective/Objective:   Pt presented for A fib. Plan will be to d/c home 12-19-16 on Xarelto. Benefits check completed and pt is aware of cost. CM did provide pt with the 30 day free card. [facility] [address] and medication is available, however the starter pack is not available. Pt is from home alone with family support. Per pt no HH needs at this time.                  Action/Plan: S/W MATTIE @ [employer] [facility] # [phone]   XARELTO 20 MG PO BID 30/60 TAB   COVER- YES  CO-PAY- $ 47.00  TIER- 3 DRUG  PRIOR APPROVAL- NO  PH[facility]ACY: [facility]  Expected Discharge Date:  12/20/16               Expected Discharge Plan:  Home/Self Care  In-House Referral:  NA  Discharge planning Services  CM Consult, Medication Assistance (possible new coag meds)  Post Acute Care Choice:  NA Choice offered to:  NA  DME Arranged:  N/A DME Agency:  NA  HH Arranged:  NA HH Agency:  NA  Status of Service:  Completed, signed off  If discussed at Long Length of Stay Meetings, dates discussed:    Additional Comments:  Gala LewandowskyGraves-Bigelow, Jaikob Borgwardt Kaye, RN 12/19/2016, 12:43 PM

## 2016-12-19 NOTE — Progress Notes (Signed)
Patient Name: Gabriela Moyer Date of Encounter: 12/19/2016  Primary Cardiologist: Dr. Rachelle HoraKelly  Hospital Problem List     Active Problems:   Afib [facility](HCC)   Atrial fibrillation with RVR (HCC)     Subjective   Feels great today, denies chest pain, SOB and palpitations.   Inpatient Medications    Scheduled Meds: . atorvastatin  40 mg Oral Daily  . clopidogrel  75 mg Oral Daily  . diltiazem  120 mg Oral Daily  . levothyroxine  50 mcg Oral QAC breakfast  . metoprolol tartrate  25 mg Oral BID   Continuous Infusions: . heparin     PRN Meds: acetaminophen, ondansetron (ZOFRAN) IV   Vital Signs    Vitals:   12/18/16 2300 12/19/16 0019 12/19/16 0408 12/19/16 0738  BP: 91/76 (!) 90/47 (!) 101/44 (!) 104/59  Pulse: 63 60 66 61  Resp: 20 19 13 15   Temp:  98 F (36.7 C) 97.7 F (36.5 C) 97.9 F (36.6 C)  TempSrc:  Oral Oral Oral  SpO2: 97% 97% 97% 99%  Weight:   166 lb (75.3 kg)   Height:        Intake/Output Summary (Last 24 hours) at 12/19/16 0753 Last data filed at 12/19/16 [id number]  Gross per 24 hour  Intake           189.55 ml  Output                0 ml  Net           189.55 ml   Filed Weights   12/18/16 0515 12/18/16 1705 12/19/16 0408  Weight: 159 lb (72.1 kg) 165 lb (74.8 kg) 166 lb (75.3 kg)    Physical Exam   GEN: Well nourished, well developed, in no acute distress.  HEENT: Grossly normal.  Neck: Supple, no JVD, carotid bruits, or masses. Cardiac: RRR, no murmurs, rubs, or gallops. No clubbing, cyanosis, edema.  Radials/DP/PT 2+ and equal bilaterally.  Respiratory:  Respirations regular and unlabored, clear to auscultation bilaterally. GI: Soft, nontender, nondistended, BS + x 4. MS: no deformity or atrophy. Skin: warm and dry, no rash. Neuro:  Strength and sensation are intact. Psych: AAOx3.  Normal affect.  Labs    CBC  Recent Labs  12/18/16 0523 12/19/16 0418  WBC 7.7 7.8  HGB 14.5 13.7  HCT 43.0 41.3  MCV 89.6 90.6  PLT 276 261    Basic Metabolic Panel  Recent Labs  12/18/16 0523 12/19/16 0418  NA 139 141  K 3.0* 3.7  CL 100* 105  CO2 24 27  GLUCOSE 145* 103*  BUN 23* 15  CREATININE 0.92 0.78  CALCIUM 9.5 9.6   Cardiac Enzymes  Recent Labs  12/18/16 1209 12/18/16 1821 12/18/16 2251  TROPONINI 0.03* 0.03* 0.03*   Fasting Lipid Panel  Recent Labs  12/19/16 0418  CHOL 129  HDL 48  LDLCALC 65  TRIG 79  CHOLHDL 2.7   Thyroid Function Tests  Recent Labs  12/18/16 0617  TSH 6.411*    Telemetry    NSR, Sinus brady - Personally Reviewed  ECG    NSR  - Personally Reviewed  Radiology    Dg Chest 2 View  Result Date: 12/18/2016 CLINICAL DATA:  Initial evaluation for acute atrial fibrillation. EXAM: CHEST  2 VIEW COMPARISON:  Prior radiograph from 10/28/2007. FINDINGS: The cardiac and mediastinal silhouettes are stable in size and contour, and remain within normal limits. Mild aortic atherosclerosis. The lungs are  normally inflated. No airspace consolidation, pleural effusion, or pulmonary edema is identified. There is no pneumothorax. No acute osseous abnormality identified. IMPRESSION: 1. No active cardiopulmonary disease. 2. Aortic atherosclerosis. Electronically Signed   By: Rise MuBenjamin  McClintock M.D.   On: 12/18/2016 05:59    Cardiac Studies   Transthoracic Echocardiography 12/18/16 Study Conclusions  - Left ventricle: The cavity size was normal. There was mild focal   basal hypertrophy of the septum. Systolic function was vigorous.   The estimated ejection fraction was in the range of 65% to 70%.   Wall motion was normal; there were no regional wall motion   abnormalities. Doppler parameters are consistent with abnormal   left ventricular relaxation (grade 1 diastolic dysfunction).   There was no evidence of elevated ventricular filling pressure by   Doppler parameters. - Aortic valve: Trileaflet; normal thickness leaflets. There was no   regurgitation. - Aortic root: The  aortic root was normal in size. - Mitral valve: Structurally normal valve. There was no   regurgitation. - Left atrium: The atrium was normal in size. - Right ventricle: Systolic function was normal. - Right atrium: The atrium was normal in size. - Tricuspid valve: There was no regurgitation. - Pulmonic valve: There was no regurgitation. - Inferior vena cava: The vessel was normal in size. - Pericardium, extracardiac: There was no pericardial effusion.   Patient Profile     Gabriela Moyer is a [age] year old female with a past medical history of diffuse peripheral vascular disease, HTN and HLD. She presented to the ED on 12/18/16 with palpitations, EKG showed rapid Afib.   Assessment & Plan  1. Atrial fibrillation with RVR: Presented with palpitations and jaw pain. She converted to NSR on diltiazem gtt. Transitioned to po diltiazem 120mg  ER today.   This patients CHA2DS2-VASc Score and unadjusted Ischemic Stroke Rate (% per year) is equal to 4.8 % stroke rate/year from a score of 4 Above score calculated as 1 point each if present [CHF, HTN, DM, Vascular=MI/PAD/Aortic Plaque, Age if 2565-74, or Female], 2 points each if present [Age > 75, or Stroke/TIA/TE]  Would anticoagulate her as she has a history of diffuse PVD as documented by her primary Cardiologist. She is already on Plavix for this. Awaiting Echo, but likely nonvalvular Afib.   Will start Xarelto per pharmacy. Stop heparin gtt.    2. Jaw pain with family history of CAD: Patient had some jaw pain and diaphoresis associated with her rapid Afib, also with risk factors for CAD including strong family history (mother and sister), PVD, and advanced age.   Troponin is negative x 3 and Echo without wall motion abnormalities. Can defer any ischemic work up to her primary Cardiologist outpatient.   3. History of PVD: Followed by Dr. Allyson SabalBerry in the past (right renal artery stenting in 2010), Continue Plavix. See discussion above.       Signed, Little IshikawaErin E Smith, NP  12/19/2016, 7:53 AM   Agree with note by Suzzette RighterErin Smith NP  Pt had PAF prompting admission with jaw pain. Quickly converted to NSR with IV---> PO dilt. Enz neg. EKG w/o acute change. 2D nl. Started on Xarelto. Exam benign. OK for DC home. OP Lexiscan then ROV with Dr Tresa EndoKelly.   Runell GessJonathan J. Rubby Barbary, M.D., FACP, [facility] At CavernaFACC, Earl LagosFAHA, [facility] CenterFSCAI [facility] [address] [place], [place]  [id number]  [phone] 12/19/2016 11:04 AM

## 2016-12-19 NOTE — Progress Notes (Signed)
ANTICOAGULATION CONSULT NOTE - Follow Up Consult  Pharmacy Consult for  Heparin -> Xarelto Indication: atrial fibrillation  Allergies  Allergen Reactions  . Codeine Nausea And Vomiting    Abdominal pain  . Contrast Media [Iodinated Diagnostic Agents] Nausea And Vomiting    Abdominal pain Caused low blood pressure  . Morphine Nausea And Vomiting    Abdominal pain    Patient Measurements: Height: 5\' 2"  (157.5 cm) Weight: 166 lb (75.3 kg) (made error. correct weight enter) IBW/kg (Calculated) : 50.1 Heparin Dosing Weight: 65 kg  Vital Signs: Temp: 97.9 F (36.6 C) (12/21 0738) Temp Source: Oral (12/21 0738) BP: 104/59 (12/21 0738) Pulse Rate: 61 (12/21 0738)  Labs:  Recent Labs  12/18/16 0523 12/18/16 1209 12/18/16 1821 12/18/16 2058 12/18/16 2251 12/19/16 0418  HGB 14.5  --   --   --   --  13.7  HCT 43.0  --   --   --   --  41.3  PLT 276  --   --   --   --  261  HEPARINUNFRC  --   --   --  0.82*  --  1.07*  CREATININE 0.92  --   --   --   --  0.78  TROPONINI  --  0.03* 0.03*  --  0.03*  --     Estimated Creatinine Clearance: 56.9 mL/min (by C-G formula based on SCr of 0.78 mg/dL).  Assessment:   New atrial fibrillation with RVR.  Converted to NSR on Diltiazem drip and converted to PO Diltazem ER. Also to transition from IV heparin to Xarelto today.  To continue Plavix for PVD.   Creatinine clearance >50 ml/min.  Goal of Therapy:  appropriate Xarelto dose for renal function and indication Monitor platelets by anticoagulation protocol: Yes   Plan:   Xarelto 20 mg PO daily. First dose this am, then daily with supper.  Stop IV heparin when giving first dose of Xarelto.  Dennie FettersEgan, Sutton Hirsch Donovan, RPh Pager: [phone] 12/19/2016,9:54 AM

## 2016-12-19 NOTE — Progress Notes (Signed)
ANTICOAGULATION CONSULT NOTE - Follow Up Consult  Pharmacy Consult for Heparin  Indication: atrial fibrillation  Allergies  Allergen Reactions  . Codeine Nausea And Vomiting    Abdominal pain  . Contrast Media [Iodinated Diagnostic Agents] Nausea And Vomiting    Abdominal pain Caused low blood pressure  . Morphine Nausea And Vomiting    Abdominal pain    Patient Measurements: Height: 5\' 2"  (157.5 cm) Weight: 166 lb (75.3 kg) (made error. correct weight enter) IBW/kg (Calculated) : 50.1 Vital Signs: Temp: 97.7 F (36.5 C) (12/21 0408) Temp Source: Oral (12/21 0408) BP: 101/44 (12/21 0408) Pulse Rate: 66 (12/21 0408)  Labs:  Recent Labs  12/18/16 0523 12/18/16 1209 12/18/16 1821 12/18/16 2058 12/18/16 2251 12/19/16 0418  HGB 14.5  --   --   --   --  13.7  HCT 43.0  --   --   --   --  41.3  PLT 276  --   --   --   --  261  HEPARINUNFRC  --   --   --  0.82*  --  1.07*  CREATININE 0.92  --   --   --   --  0.78  TROPONINI  --  0.03* 0.03*  --  0.03*  --     Estimated Creatinine Clearance: 56.9 mL/min (by C-G formula based on SCr of 0.78 mg/dL).    Assessment: Heparin for afib, heparin level is high this AM, confirmed with RN that level was drawn from opposite arm of heparin infusion.   Goal of Therapy:  Heparin level 0.3-0.7 units/ml Monitor platelets by anticoagulation protocol: Yes   Plan:  -Hold heparin x 1 hr -Re-start heparin infusion at 700 units/hr at 0700 -Check 1500 HL  Abran DukeLedford, Cecia Egge 12/19/2016,6:07 AM

## 2016-12-19 NOTE — Discharge Summary (Signed)
Discharge Summary    Patient ID: Gabriela CrazierLinda C Moyer,  MRN: [id number], DOB/AGE: 05-Aug-1941 75 y.o.  Admit date: 12/18/2016 Discharge date: 12/19/2016  Primary Care Provider: Adelfa Kohichard W [name] Primary Cardiologist: Dr. Tresa EndoKelly  Discharge Diagnoses    Active Problems:   Afib [facility](HCC)   Atrial fibrillation with RVR (HCC)   Allergies Allergies  Allergen Reactions  . Codeine Nausea And Vomiting    Abdominal pain  . Contrast Media [Iodinated Diagnostic Agents] Nausea And Vomiting    Abdominal pain Caused low blood pressure  . Morphine Nausea And Vomiting    Abdominal pain    Diagnostic Studies/Procedures   Transthoracic Echocardiography 12/18/16 Study Conclusions  - Left ventricle: The cavity size was normal. There was mild focal   basal hypertrophy of the septum. Systolic function was vigorous.   The estimated ejection fraction was in the range of 65% to 70%.   Wall motion was normal; there were no regional wall motion   abnormalities. Doppler parameters are consistent with abnormal   left ventricular relaxation (grade 1 diastolic dysfunction).   There was no evidence of elevated ventricular filling pressure by   Doppler parameters. - Aortic valve: Trileaflet; normal thickness leaflets. There was no   regurgitation. - Aortic root: The aortic root was normal in size. - Mitral valve: Structurally normal valve. There was no   regurgitation. - Left atrium: The atrium was normal in size. - Right ventricle: Systolic function was normal. - Right atrium: The atrium was normal in size. - Tricuspid valve: There was no regurgitation. - Pulmonic valve: There was no regurgitation. - Inferior vena cava: The vessel was normal in size. - Pericardium, extracardiac: There was no pericardial effusion.  _____________   History of Present Illness    [name] is a 75 year old female with a past medical history of diffuse peripheral vascular disease, HTN and HLD. She presented to the ED  on 12/18/16 with palpitations, EKG showed rapid Afib.   [name] was awakened from sleep early in the morning of 12/18/16 with a funny feeling in her heart, not really palpitations but more like her heart was moving around. She also felt bilateral jaw pain and was slightly diaphoretic. She called EMS.   Upon arrival to the ED, she was found to rapid Afib with a rate of 132 bpm. She was started on a diltiazem gtt and converted to NSR. At the time of my encounter she feels much better, denies any palpitations and chest pain.   She is a patient of Dr. Tresa EndoKelly and was last seen for follow up in September of this year and was doing well. He repeated her carotid and renal doppler studies which were both stable. She has a family history of MI with her mother having heart diease and her sister recently had a MI a few months ago and had a stent placed to her LAD.   Her EKG after she converted to NSR shows ST elevation in her inferolateral leads, this is consistent with her prior EKG's and likely does not represent active ischemia.   She tells me that she has no history of Afib, and has never felt like she did this morning.    Hospital Course  1. Atrial fibrillation with RVR: Presented with palpitations and jaw pain. She converted to NSR on diltiazem gtt. Transitioned to po diltiazem 120mg  ER.  This patients CHA2DS2-VASc Score and unadjusted Ischemic Stroke Rate (% per year) is equal to 4.8 % stroke rate/year  from a score of 4Above score calculated as 1 point each if present [CHF, HTN, DM, Vascular=MI/PAD/Aortic Plaque, Age if 65-74, orFemale], 2 points each if present [Age >75, or Stroke/TIA/TE]  Would anticoagulate her as she has a history of diffuse PVD as documented by her primary Cardiologist. She is already on Plavix for this. Will start Xarelto.  2. Jaw pain with family history of CAD: Patient had some jaw pain and diaphoresis associated with her rapid Afib, also with risk factors for  CAD including strong family history (mother and sister), PVD, and advanced age.   Troponin is negative x 3 and Echo without wall motion abnormalities.   Recommend outpatient stress test to evaluate for ischemia.   3. History of PVD: Followed by Dr. Allyson Sabal in the past (right renal artery stenting in 2010), Continue Plavix. See discussion above.    _____________  Discharge Vitals Blood pressure (!) 108/51, pulse 69, temperature 97.8 F (36.6 C), temperature source Oral, resp. rate 17, height 5\' 2"  (1.575 m), weight 166 lb (75.3 kg), SpO2 100 %.  Filed Weights   12/18/16 0515 12/18/16 1705 12/19/16 0408  Weight: 159 lb (72.1 kg) 165 lb (74.8 kg) 166 lb (75.3 kg)    Labs & Radiologic Studies     CBC  Recent Labs  12/18/16 0523 12/19/16 0418  WBC 7.7 7.8  HGB 14.5 13.7  HCT 43.0 41.3  MCV 89.6 90.6  PLT 276 261   Basic Metabolic Panel  Recent Labs  12/18/16 0523 12/19/16 0418  NA 139 141  K 3.0* 3.7  CL 100* 105  CO2 24 27  GLUCOSE 145* 103*  BUN 23* 15  CREATININE 0.92 0.78  CALCIUM 9.5 9.6   Cardiac Enzymes  Recent Labs  12/18/16 1209 12/18/16 1821 12/18/16 2251  TROPONINI 0.03* 0.03* 0.03*   Fasting Lipid Panel  Recent Labs  12/19/16 0418  CHOL 129  HDL 48  LDLCALC 65  TRIG 79  CHOLHDL 2.7   Thyroid Function Tests  Recent Labs  12/18/16 0617  TSH 6.411*    Dg Chest 2 View  Result Date: 12/18/2016 CLINICAL DATA:  Initial evaluation for acute atrial fibrillation. EXAM: CHEST  2 VIEW COMPARISON:  Prior radiograph from 10/28/2007. FINDINGS: The cardiac and mediastinal silhouettes are stable in size and contour, and remain within normal limits. Mild aortic atherosclerosis. The lungs are normally inflated. No airspace consolidation, pleural effusion, or pulmonary edema is identified. There is no pneumothorax. No acute osseous abnormality identified. IMPRESSION: 1. No active cardiopulmonary disease. 2. Aortic atherosclerosis. Electronically  Signed   By: Rise Mu M.D.   On: 12/18/2016 05:59    Disposition   Pt is being discharged home today in good condition.  Follow-up Plans & Appointments    Follow-up Information    Nicolasa Ducking, NP Follow up on 01/01/2017.   Specialties:  Nurse Practitioner, Cardiology, Radiology Why:  at 9:30 am with Dr. Landry Dyke NP  Contact information: [address] [place] [address] [phone]          Discharge Instructions    Diet - low sodium heart healthy    Complete by:  As directed    Increase activity slowly    Complete by:  As directed       Discharge Medications   Discharge Medication List as of 12/19/2016  1:33 PM    START taking these medications   Details  diltiazem (CARDIZEM CD) 120 MG 24 hr capsule Take 1 capsule (120 mg total)  by mouth daily., Starting Fri 12/20/2016, Normal    rivaroxaban (XARELTO) 20 MG TABS tablet Take 1 tablet (20 mg total) by mouth daily with supper., Starting Fri 12/20/2016, Normal      CONTINUE these medications which have NOT CHANGED   Details  atorvastatin (LIPITOR) 40 MG tablet Take 1 tablet (40 mg total) by mouth daily., Starting Wed 07/24/2016, Until Thu 12/18/2017, Normal    clopidogrel (PLAVIX) 75 MG tablet TAKE 1 TABLET BY MOUTH DAILY, Normal    Levothyroxine Sodium 50 MCG CAPS Take 50 mcg by mouth daily before breakfast. , Historical Med    metoprolol tartrate (LOPRESSOR) 25 MG tablet Patient takes 2 tablets by  mouth in the morning and one tablet by mouth at night, Normal      STOP taking these medications     amLODipine (NORVASC) 10 MG tablet      hydrochlorothiazide (HYDRODIURIL) 25 MG tablet            Outstanding Labs/Studies     Duration of Discharge Encounter   Greater than 30 minutes including physician time.  Signed, Little IshikawaErin E Chance Karam NP 12/19/2016, 2:51 PM

## 2016-12-19 NOTE — Telephone Encounter (Signed)
Still admitted 12/19/16 If discharged 12/21, first TOC attempt should be 12/20/16

## 2016-12-19 NOTE — Telephone Encounter (Signed)
New message     TCM appt on  1.3.2018  With Ward Givenshris Berge. Per Suzzette RighterErin Smith.

## 2016-12-20 LAB — HEMOGLOBIN A1C
HEMOGLOBIN A1C: 5.3 % (ref 4.8–5.6)
MEAN PLASMA GLUCOSE: 105 mg/dL

## 2016-12-20 NOTE — Telephone Encounter (Signed)
TOC call attempt #1-no answer, lmtcb. 

## 2016-12-24 NOTE — Telephone Encounter (Signed)
Patient contacted regarding discharge from [facility] on 12/19/16.  Patient understands to [facility] Patient understands discharge instructions? yes  Patient understands medications and regiment? YES Patient understands to bring all medications to this visit?YES

## 2016-12-25 ENCOUNTER — Telehealth: Payer: Self-pay | Admitting: Nurse Practitioner

## 2016-12-25 NOTE — Telephone Encounter (Signed)
Follow Up    [facility]

## 2016-12-25 NOTE — Telephone Encounter (Signed)
Left message again of f/u appointment  01/01/17 with berge at 2:30 pm . Call back ,to inform office of receive call.

## 2017-01-01 ENCOUNTER — Ambulatory Visit (INDEPENDENT_AMBULATORY_CARE_PROVIDER_SITE_OTHER): Payer: PPO | Admitting: Nurse Practitioner

## 2017-01-01 ENCOUNTER — Encounter: Payer: Self-pay | Admitting: Nurse Practitioner

## 2017-01-01 ENCOUNTER — Ambulatory Visit: Payer: Commercial Managed Care - HMO | Admitting: Nurse Practitioner

## 2017-01-01 VITALS — BP 150/71 | HR 71 | Ht 62.0 in | Wt 174.0 lb

## 2017-01-01 DIAGNOSIS — I5033 Acute on chronic diastolic (congestive) heart failure: Secondary | ICD-10-CM | POA: Diagnosis not present

## 2017-01-01 DIAGNOSIS — I110 Hypertensive heart disease with heart failure: Secondary | ICD-10-CM | POA: Diagnosis not present

## 2017-01-01 DIAGNOSIS — I739 Peripheral vascular disease, unspecified: Secondary | ICD-10-CM | POA: Diagnosis not present

## 2017-01-01 DIAGNOSIS — R6884 Jaw pain: Secondary | ICD-10-CM

## 2017-01-01 DIAGNOSIS — R7302 Impaired glucose tolerance (oral): Secondary | ICD-10-CM | POA: Diagnosis not present

## 2017-01-01 DIAGNOSIS — I4891 Unspecified atrial fibrillation: Secondary | ICD-10-CM | POA: Diagnosis not present

## 2017-01-01 DIAGNOSIS — D692 Other nonthrombocytopenic purpura: Secondary | ICD-10-CM | POA: Diagnosis not present

## 2017-01-01 DIAGNOSIS — E782 Mixed hyperlipidemia: Secondary | ICD-10-CM

## 2017-01-01 DIAGNOSIS — Z6831 Body mass index (BMI) 31.0-31.9, adult: Secondary | ICD-10-CM | POA: Diagnosis not present

## 2017-01-01 DIAGNOSIS — M8580 Other specified disorders of bone density and structure, unspecified site: Secondary | ICD-10-CM | POA: Diagnosis not present

## 2017-01-01 DIAGNOSIS — Z0000 Encounter for general adult medical examination without abnormal findings: Secondary | ICD-10-CM | POA: Diagnosis not present

## 2017-01-01 DIAGNOSIS — E11319 Type 2 diabetes mellitus with unspecified diabetic retinopathy without macular edema: Secondary | ICD-10-CM | POA: Diagnosis not present

## 2017-01-01 DIAGNOSIS — E668 Other obesity: Secondary | ICD-10-CM | POA: Diagnosis not present

## 2017-01-01 DIAGNOSIS — I480 Paroxysmal atrial fibrillation: Secondary | ICD-10-CM

## 2017-01-01 DIAGNOSIS — E038 Other specified hypothyroidism: Secondary | ICD-10-CM | POA: Diagnosis not present

## 2017-01-01 DIAGNOSIS — R195 Other fecal abnormalities: Secondary | ICD-10-CM

## 2017-01-01 DIAGNOSIS — I150 Renovascular hypertension: Secondary | ICD-10-CM | POA: Diagnosis not present

## 2017-01-01 DIAGNOSIS — I701 Atherosclerosis of renal artery: Secondary | ICD-10-CM | POA: Diagnosis not present

## 2017-01-01 MED ORDER — HYDROCHLOROTHIAZIDE 25 MG PO TABS: 25.0000 mg | ORAL_TABLET | Freq: Every day | ORAL | 3 refills | Status: DC

## 2017-01-01 NOTE — Progress Notes (Signed)
Office Visit    Patient Name: Gabriela Moyer Date of Encounter: 01/01/2017  Primary Care Provider:  Gaspar Garbe, MD Primary Cardiologist:  Bishop Limbo, MD   Chief Complaint    76 y/o ? with a h/o Hypertension, hyperlipidemia, diastolic dysfunction, and carotid/renal/peripheral vascular disease who presents for follow-up after recent hospitalization for paroxysmal atrial fibrillation.  Past Medical History    Past Medical History:  Diagnosis Date  . Carotid arterial disease (HCC)    a. 08/2016 Carotid U/S: 1-39% bilat ICA stenosis, >50 R ECA stenosis.  . Diastolic dysfunction    a. 11/2016 Echo: EF 65-70%, Gr1 DD.  Marland Kitchen Hyperlipidemia   . Hypertension   . PAF (paroxysmal atrial fibrillation) (HCC)    a. 11/2016 AF RVR in ED-->converted on IV dilt-->CHA2DS2VASc = 4-->Xarelto.  . Renal artery stenosis (HCC)    a. 06/2009 s/p R Renal Artery PTA/stenting; b. 08/2016 Renal Duplex: nl LRA, >60% RRA stenosis- f/u 1 yr.  . Subclavian arterial stenosis (HCC)    a. 08/2014 Upper Ext Duplex: R distal [place] & Ax - 70-99% diam reduction, L distal [place] & Ax - 50-69%--stable.   Past Surgical History:  Procedure Laterality Date  . CARDIAC CATHETERIZATION  11/03/2007   atherosclerotic right renal artery (Dr. Evlyn Courier)  . Carotid Doppler  12/2010   R & L ICA with small amount fibrous plaque; R distal subclav. 70-99% diameter reduction; L mid subclav 50-69% diameter reduction  . Lower Extremity Arterial Doppler  03/2008   right ABI - mild arterial insuff at rest; R CIA with less than 50% diameter reduction, R & L SFA with less than 50% diameter reduction  . NM MYOCAR PERF WALL MOTION  09/2009   bruce myoview - normal perfusion, EF 915, low risk scan  . RENAL ANGIOPLASTY  11/07/2009   5x5mm Cordis Genesis Aviator stent to ostium of right renal artery (Dr. Erlene Quan)  . Renal Artery Doppler  04/2012   celiac artery with >50% diameter reduction; R renal artery stent w/mildly elevated velocities (1-59%  diameter reduction)  . TRANSTHORACIC ECHOCARDIOGRAM  08/28/2009   EF=>55%; trace TR; mild AV regurg, mild pulm valve regurg  . Upper Extremity Arterial Doppler  04/2012   right brachial pressure , left ; R distal subclavian & axillary arteries 70-99% diameter reduction; L distal subclavian/axillary & brachial arteries (50-69% diameter reduction)    Allergies  Allergies  Allergen Reactions  . Codeine Nausea And Vomiting    Abdominal pain  . Contrast Media [Iodinated Diagnostic Agents] Nausea And Vomiting    Abdominal pain Caused low blood pressure  . Morphine Nausea And Vomiting    Abdominal pain    History of Present Illness    76 year old female with the above complex past medical history including hypertension, hyperlipidemia, diastolic dysfunction, stable carotid arterial stenosis, renal artery stenosis status post stenting, and right greater than left subclavian artery stenosis. She was recently admitted to [facility] with jaw pain in the setting of paroxysmal atrial fibrillation. This broke with IV diltiazem and she was subsequently admitted. She ruled out for MI. She was converted to by mouth diltiazem. Echo showed normal LV function with grade 1 diastolic dysfunction. There were no significant valvular abnormalities. She was subsequently discharged on Xarelto and diltiazem CD was also added to her regimen. In that setting, her previous home dose of hydrochlorothiazide was discontinued. Since admission, she has been noticing an increase in lower extremity edema, abdominal girth, and also weight gain of  about 7-8 pounds. She has had some dyspnea on exertion though this just started today. She denies PND, orthopnea, dizziness, syncope, or early satiety. She is disappointed by her fluid overload and weight gain.  She has not had any palpitations or chest pain.  She is also recently noted intermittent dark stools. She saw primary care this morning but forgot to mention this. She  has not noticed any blood. She recently had lab work through primary care which she says was normal. I do not have access to these labs.  Home Medications    Prior to Admission medications   Medication Sig Start Date End Date Taking? Authorizing Provider  atorvastatin (LIPITOR) 40 MG tablet Take 1 tablet (40 mg total) by mouth daily. Patient taking differently: Take 20 mg by mouth daily at 6 PM.  07/24/16 12/18/17 Yes Kristin L Alvstad, RPH-CPP  clopidogrel (PLAVIX) 75 MG tablet TAKE 1 TABLET BY MOUTH DAILY 10/17/16  Yes Lennette Biharihomas A Kelly, MD  diltiazem (CARDIZEM CD) 120 MG 24 hr capsule Take 1 capsule (120 mg total) by mouth daily. 12/20/16  Yes Little IshikawaErin E Smith, NP  Levothyroxine Sodium 50 MCG CAPS Take 50 mcg by mouth daily before breakfast.    Yes Historical Provider, MD  metoprolol tartrate (LOPRESSOR) 25 MG tablet Patient takes 2 tablets by  mouth in the morning and one tablet by mouth at night Patient taking differently: Take 25-50 mg by mouth 2 (two) times daily. Patient takes 2 tablets by  mouth in the morning and one tablet by mouth at night 10/18/16  Yes Lennette Biharihomas A Kelly, MD  rivaroxaban (XARELTO) 20 MG TABS tablet Take 1 tablet (20 mg total) by mouth daily with supper. 12/20/16  Yes Little IshikawaErin E Smith, NP    Review of Systems    Wt gain and increasing lower ext edema and abd girth since hospitalization.  No chest pain, Palpitations, PND, orthopnea, dizziness, syncope, or early satiety.  All other systems reviewed and are otherwise negative except as noted above.  Physical Exam    VS:  BP (!) 150/71   Pulse 71   Ht 5\' 2"  (1.575 m)   Wt 174 lb (78.9 kg)   BMI 31.83 kg/m  , BMI Body mass index is 31.83 kg/m. GEN: Well nourished, well developed, in no acute distress.  HEENT: normal.  Neck: Supple, no JVD, carotid bruits, or masses. Cardiac: RRR, no murmurs, rubs, or gallops. No clubbing, cyanosis, edema.  Radials/DP/PT 2+ and equal bilaterally.  Respiratory:  Respirations regular and  unlabored, clear to auscultation bilaterally. GI: Soft, nontender, nondistended, BS + x 4. MS: no deformity or atrophy. Skin: warm and dry, no rash. Neuro:  Strength and sensation are intact. Psych: Normal affect.  Accessory Clinical Findings    Lexiscan Myoview is pending  Assessment & Plan    1.  Paroxysmal atrial fibrillation: Patient was recently admitted in the setting of jaw pain and paroxysmal atrial fibrillation. This converted with IV diltiazem and she was transitioned to oral diltiazem. She's had no recurrence of palpitations. She had jaw pain at the time of her PAF and had no recurrence of this either. She remains on Xarelto with a CHA2DS2VASc of 4. She is also on beta blocker and diltiazem.  She had repeat labs performed by primary care last week.  I do not have those results available to me, though she says that she was told today that her labs looked good.  2. Acute on chronic diastolic CHF: Patient had chronically been  on HCTZ but this was discontinued during her admission for A. fib. Review of record shows that her blood pressures were soft during her hospitalization and I suspect that this is why it was discontinued. Since then, she has had increasing lower extremity swelling, increasing abdominal girth, and some dyspnea on exertion. Her weight is up about 5-7 pounds. She had normal renal function through her hospital stay. Echo during hospital physician showed normal LV function. I will resume HCTZ 25 mg daily. We did discuss the diltiazem may contribute to lower extremity swelling but I also noted that she had previously been on amlodipine and tolerated that. Therefore I will continue diltiazem.  3. Dark Stools:  Pt notes occasional dark stools since initiating xarelto.  No brbpr.  She had a cbc drawn by her pcp recently, though I do not have those results.  We do not have stool cards in the office.  I have asked her to contact her pcp to obtain stool cards.  She will require GI  eval if abnl and may need to halt xarelto.  4. Jaw pain: Patient had bilateral jaw pain when she presented in atrial fibrillation. She ruled out for MI and echo showed normal LV function. Recommendation is made for outpatient stress testing however this was never arranged. I will arrange for a Lexiscan Myoview.  5. Hypertensive heart disease with CHF: Blood pressure is elevated today. I'm resuming HCTZ as above.  6. Peripheral arterial disease: Recent stable ABIs and renal duplex. She remains on Plavix and statin therapy.  7. Hyperlipidemia: LDL was 65 on December 21. Continue Lipitor therapy.  8.  Disposition: Follow-up Lexiscan Myoview.  She will reach out to primary care for stool cards.  I will see her back in 2 wks or sooner if necessary.  Nicolasa Ducking, NP 01/01/2017, 2:58 PM

## 2017-01-01 NOTE — Patient Instructions (Addendum)
Medication Instructions: Gabriela Givenshris Berge, NP has recommended making the following medication changes: 1. RESTART Hydrochlorothiazide 25 mg - take 1 tablet by mouth once daily  Labwork: NONE ORDERED  Testing/Procedures: 1. Lexiscan Myoview stress test - Your physician has requested that you have a lexiscan myoview. For further information please visit [email or web address]. Please follow instruction sheet, as given.  Follow-up: Gabriela Moyer recommends that you schedule a follow-up appointment with him in 3-4 weeks.  If you need a refill on your cardiac medications before your next appointment, please call your pharmacy.   Please discuss dark stools with your primary care physician!

## 2017-01-08 ENCOUNTER — Telehealth (HOSPITAL_COMMUNITY): Payer: Self-pay

## 2017-01-08 NOTE — Telephone Encounter (Signed)
Encounter complete. 

## 2017-01-10 ENCOUNTER — Ambulatory Visit (HOSPITAL_COMMUNITY)
Admission: RE | Admit: 2017-01-10 | Discharge: 2017-01-10 | Disposition: A | Discharge status: Home / Self Care | Payer: PPO | Source: Ambulatory Visit | Attending: Internal Medicine | Admitting: Internal Medicine

## 2017-01-10 DIAGNOSIS — R6884 Jaw pain: Secondary | ICD-10-CM | POA: Insufficient documentation

## 2017-01-10 LAB — MYOCARDIAL PERFUSION IMAGING
CHL CUP NUCLEAR SDS: 2
CHL CUP NUCLEAR SRS: 2
CSEPPHR: 85 {beats}/min
LV dias vol: 67 mL (ref 46–106)
LV sys vol: 18 mL
Rest HR: 58 {beats}/min
SSS: 3
TID: 1.17

## 2017-01-10 MED ORDER — TECHNETIUM TC 99M TETROFOSMIN IV KIT
9.9000 | PACK | Freq: Once | INTRAVENOUS | Status: AC | PRN
  Administered 2017-01-10: 9.9 via INTRAVENOUS
  Filled 2017-01-10: qty 10

## 2017-01-10 MED ORDER — REGADENOSON 0.4 MG/5ML IV SOLN
0.4000 mg | Freq: Once | INTRAVENOUS | Status: AC
  Administered 2017-01-10: 0.4 mg via INTRAVENOUS

## 2017-01-10 MED ORDER — TECHNETIUM TC 99M TETROFOSMIN IV KIT
31.1000 | PACK | Freq: Once | INTRAVENOUS | Status: AC | PRN
  Administered 2017-01-10: 31.1 via INTRAVENOUS
  Filled 2017-01-10: qty 32

## 2017-01-20 DIAGNOSIS — Z1212 Encounter for screening for malignant neoplasm of rectum: Secondary | ICD-10-CM | POA: Diagnosis not present

## 2017-01-29 ENCOUNTER — Ambulatory Visit (INDEPENDENT_AMBULATORY_CARE_PROVIDER_SITE_OTHER): Payer: PPO | Admitting: Nurse Practitioner

## 2017-01-29 ENCOUNTER — Encounter: Payer: Self-pay | Admitting: Nurse Practitioner

## 2017-01-29 VITALS — BP 139/64 | HR 64 | Ht 62.0 in | Wt 174.0 lb

## 2017-01-29 DIAGNOSIS — I5043 Acute on chronic combined systolic (congestive) and diastolic (congestive) heart failure: Secondary | ICD-10-CM

## 2017-01-29 DIAGNOSIS — I480 Paroxysmal atrial fibrillation: Secondary | ICD-10-CM | POA: Diagnosis not present

## 2017-01-29 DIAGNOSIS — E782 Mixed hyperlipidemia: Secondary | ICD-10-CM

## 2017-01-29 DIAGNOSIS — I110 Hypertensive heart disease with heart failure: Secondary | ICD-10-CM

## 2017-01-29 MED ORDER — METOPROLOL SUCCINATE ER 25 MG PO TB24: 75.0000 mg | ORAL_TABLET | Freq: Every day | ORAL | 11 refills | Status: DC

## 2017-01-29 NOTE — Progress Notes (Signed)
Office Visit    Patient Name: DAUNE DIVIRGILIO Date of Encounter: 01/29/2017  Primary Care Provider:  Gaspar Garbe, MD Primary Cardiologist:  Bishop Limbo, MD  Chief Complaint    76 y/o ? with h/o Hypertension, Hyperlipidemia, Diastolic Dysfunction, and carotid/renal/peripheral vascular disease who presents for follow-up after Lexiscan Myoview.   Past Medical History    Past Medical History:  Diagnosis Date  . Carotid arterial disease (HCC)    a. 08/2016 Carotid U/S: 1-39% bilat ICA stenosis, >50 R ECA stenosis.  . Diastolic dysfunction    a. 11/2016 Echo: EF 65-70%, Gr1 DD.  Marland Kitchen History of stress test    a. 12/2016 Lexiscan MV: EF 73%, no ischemia/infarct.  . Hyperlipidemia   . Hypertension   . PAF (paroxysmal atrial fibrillation) (HCC)    a. 11/2016 AF RVR in ED-->converted on IV dilt-->CHA2DS2VASc = 4-->Xarelto.  . Renal artery stenosis (HCC)    a. 06/2009 s/p R Renal Artery PTA/stenting; b. 08/2016 Renal Duplex: nl LRA, >60% RRA stenosis- f/u 1 yr.  . Subclavian arterial stenosis (HCC)    a. 08/2014 Upper Ext Duplex: R distal [place] & Ax - 70-99% diam reduction, L distal [place] & Ax - 50-69%--stable.   Past Surgical History:  Procedure Laterality Date  . CARDIAC CATHETERIZATION  11/03/2007   atherosclerotic right renal artery (Dr. Evlyn Courier)  . Carotid Doppler  12/2010   R & L ICA with small amount fibrous plaque; R distal subclav. 70-99% diameter reduction; L mid subclav 50-69% diameter reduction  . Lower Extremity Arterial Doppler  03/2008   right ABI - mild arterial insuff at rest; R CIA with less than 50% diameter reduction, R & L SFA with less than 50% diameter reduction  . NM MYOCAR PERF WALL MOTION  09/2009   bruce myoview - normal perfusion, EF 915, low risk scan  . RENAL ANGIOPLASTY  11/07/2009   5x51mm Cordis [employer] Aviator stent to ostium of right renal artery (Dr. Erlene Quan)  . Renal Artery Doppler  04/2012   celiac artery with >50% diameter reduction; R renal artery stent  w/mildly elevated velocities (1-59% diameter reduction)  . TRANSTHORACIC ECHOCARDIOGRAM  08/28/2009   EF=>55%; trace TR; mild AV regurg, mild pulm valve regurg  . Upper Extremity Arterial Doppler  04/2012   right brachial pressure , left ; R distal subclavian & axillary arteries 70-99% diameter reduction; L distal subclavian/axillary & brachial arteries (50-69% diameter reduction)    Allergies  Allergies  Allergen Reactions  . Codeine Nausea And Vomiting    Abdominal pain  . Contrast Media [Iodinated Diagnostic Agents] Nausea And Vomiting    Abdominal pain Caused low blood pressure  . Morphine Nausea And Vomiting    Abdominal pain    History of Present Illness    76 year old female with the above complex past medical history including hypertension, hyperlipidemia, diastolic dysfunction, stable carotid arterial stenosis, renal artery stenosis status post stenting, and right greater than left subclavian artery stenosis. She was admitted to [facility] with jaw pain in the setting of paroxysmal atrial fibrillation on 12/18/16. She converted on IV diltiazem.  She was seen in the office by me on 01/01/17. At the time she had no recurrence of palpitations and remained on Xarelto with a CHA2DS2VASc score of 4. At the time of atrial fibrillation onset, the patient had bilateral jaw pain and was ruled out for an MI and echo showed normal LV function. It was recommended for outpatient stress testing,  so I arranged for a Lexiscan Myoview.   She was taken off HCTZ on discharge from the hospital as a review of her record showed her blood pressures were soft during her hospitalization. Due to increasing lower extremity swelling, increasing abdominal girth, some dyspnea on exertion and a noted 5-7 pound weight gain, I resumed her HCTZ 25mg  daily. The patient also noted Dark Stools since the initiation of Xarelto. I asked her to f/u with her PCP to obtain stool cards.   Today the patient  presents after a negative Lexiscan Myoview. She reports her stool guaiac was positive per her PCP and has been referred to GI. She has self-discontinued her Xarelto due to side effects (? GI upset, fatigue, and lower ext swelling) and she now feels better.  In light of guaiac + stools with pending GI eval, she prefers to remain off of OAC for now (she is on long term plavix Rx). She believes her swelling has improved, however her weight gain persists. She has been experiencing palpitations, occurring multiple times per week. The palpitations are intermittent, lasting a few seconds @ a time, but recurring several x/hr.  As a result, she has been taking her PM dose of metoprolol earlier than scheduled.  She equates her current burden of palpitations as similar to what she has had throughout her adult life and different from when she developed Afib.  She does not have any current chest pain, shortness of breath, dyspnea on exertion, orthopnea, or paroxysmal nocturnal dyspnea.    Home Medications    Prior to Admission medications   Medication Sig Start Date End Date Taking? Authorizing Provider  atorvastatin (LIPITOR) 40 MG tablet Take 1 tablet (40 mg total) by mouth daily. Patient taking differently: Take 20 mg by mouth daily at 6 PM.  07/24/16 12/18/17 Yes Kristin L Alvstad, RPH-CPP  clopidogrel (PLAVIX) 75 MG tablet TAKE 1 TABLET BY MOUTH DAILY 10/17/16  Yes Lennette Biharihomas A Kelly, MD  diltiazem (CARDIZEM CD) 120 MG 24 hr capsule Take 1 capsule (120 mg total) by mouth daily. 12/20/16  Yes Little IshikawaErin E Smith, NP  hydrochlorothiazide (HYDRODIURIL) 25 MG tablet Take 1 tablet (25 mg total) by mouth daily. 01/01/17 04/01/17 Yes Ok Anishristopher R Syaire Saber, NP  Levothyroxine Sodium 50 MCG CAPS Take 50 mcg by mouth daily before breakfast.    Yes Historical Provider, MD  metoprolol succinate (TOPROL-XL) 25 MG 24 hr tablet Take 3 tablets (75 mg total) by mouth daily. Take with or immediately following a meal. 01/29/17 04/29/17  Ok Anishristopher R  Aliesha Dolata, NP    Review of Systems    She denies chest pain, dyspnea, pnd, orthopnea, n, v, dizziness, syncope, weight gain, or early satiety. She has had intermittent, brief palpitations as outlined above.  She thinks her lower ext swelling has improved some.  All other systems reviewed and are otherwise negative except as noted above.  Physical Exam    VS:  BP 139/64 (BP Location: Right Arm, Patient Position: Sitting, Cuff Size: Normal)   Pulse 64   Ht 5\' 2"  (1.575 m)   Wt 174 lb (78.9 kg)   SpO2 100%   BMI 31.83 kg/m  , BMI Body mass index is 31.83 kg/m. GEN: Well nourished, well developed, in no acute distress.  HEENT: normal.  Neck: Supple, no JVD or masses. bilat carotid bruits, R>L. Cardiac: RRR. Blowing quality bruit heard over the right upper chest - likely representative of R subclavian bruit. No rubs, or gallops. No clubbing, cyanosis. Trace nonpitting edema  bilaterally.  Radials/DP/PT 2+ and equal bilaterally.  Respiratory:  Respirations regular and unlabored, clear to auscultation bilaterally. GI: Soft, nontender, nondistended, BS + x 4. MS: no deformity or atrophy. Skin: warm and dry, no rash. Neuro:  Strength and sensation are intact. Psych: Normal affect.  Accessory Clinical Findings    Lexiscan Myoview 1.12.2018  The left ventricular ejection fraction is hyperdynamic (>65%).  Nuclear stress EF: 73%.  The study is normal.  This is a low risk study.  There was no ST segment deviation noted during stress.   Assessment & Plan    1.  Paroxysmal atrial fibrillation/Palpitations: She was admitted to [facility] in late December with jaw pain, palpitations, and Afib.  She converted on IV dilt and has since been managed with oral  blocker and dilt.  Though she has noted occasional palps, esp in the PM prior to her evening dose of metoprolol, she has not had any symptoms similar to prior AF symptoms.  I will switch her to long acting toprol xl @ 75 mg daily to see if we can  prevent palpitations with more consistent coverage.  She otw remains on long acting dilt as well.  Baseline HR is in the 60's, thus I will not titrate AVN agents today.  The patient has self discontinued her Xarelto after recent guaiac + stool.  She is pending GI eval.  She says that recent CBC was nl (drawn by pcp).  She wishes to remain off of oral anticoagulation, she is awaiting a GI appointment for further evaluation. Will hold Xarelto pending GI evaluation. Her CHA2DS2VASc remains a 4.  Discussed risk of stroke if atrial fibrillation were to recur. Patient is in sound mind, alert and oriented and states understanding.    2. Jaw pain: At the time of atrial fibrillation onset, the patient had bilateral jaw pain and was ruled out for an MI and echo showed normal LV function. It was recommended for outpatient stress testing, so I arranged for a Lexiscan Myoview. The study was normal and considered a low risk study. There was no ST segment deviation noted and the left ventricular ejection fraction was hyperdynamic (>65%) and the EF was 73%.  3. Hypertensive heart disease with CHF: Patient self reported home blood pressure down in the 120s/80s. Continue HCTZ dosing. Repeat BMP to f/u K as she was hypokalemic on admission 12/20.  4. Peripheral arterial disease: Recent stable ABIs and renal duplex. She remains on Plavix and statin therapy.   5. Chronic Diastolic Heart Failure: She was taken off HCTZ on discharge from the hospital as a review of her record showed her blood pressures were soft during her hospitalization. Due to increasing lower extremity swelling, increasing abdominal girth, some dyspnea on exertion and a noted 5-7 pound weight gain last visit, I resumed her on HCTZ 25mg . Today her swelling has decreased and her shortness of breath has resolved. The patient does not appear to be volume overloaded on exam. As noted above I will check a BMP level.   6. Hyperlipidemia: LDL was 65 on December 21.  Continue Lipitor therapy.   7. Disposition: Follow-up in 3 months with Dr. Tresa Endo. Patient will reach out after evaluation from GI to discuss restarting Xarelto. Discussed if similar symptoms of atrial fibrillation return she is to call the office or report to the emergency room. I will see her back sooner if necessary.   Nicolasa Ducking, NP 01/29/2017, 12:02 PM

## 2017-01-29 NOTE — Patient Instructions (Signed)
Medication Instructions:  STOP- Metoprolol Tartrate  START- Metoprolol Succinate 25 mg take 3 tablets daily  Labwork: None Ordered  Testing/Procedures: None Ordered  Follow-Up: Your physician recommends that you schedule a follow-up appointment in: 3 Months with Dr Tresa EndoKelly.   Any Other Special Instructions Will Be Listed Below (If Applicable).   If you need a refill on your cardiac medications before your next appointment, please call your pharmacy.

## 2017-02-04 ENCOUNTER — Encounter: Payer: Self-pay | Admitting: Internal Medicine

## 2017-02-05 ENCOUNTER — Encounter: Payer: Self-pay | Admitting: Internal Medicine

## 2017-03-04 DIAGNOSIS — H43813 Vitreous degeneration, bilateral: Secondary | ICD-10-CM | POA: Diagnosis not present

## 2017-03-04 DIAGNOSIS — H524 Presbyopia: Secondary | ICD-10-CM | POA: Diagnosis not present

## 2017-03-04 DIAGNOSIS — H25013 Cortical age-related cataract, bilateral: Secondary | ICD-10-CM | POA: Diagnosis not present

## 2017-03-04 DIAGNOSIS — H2513 Age-related nuclear cataract, bilateral: Secondary | ICD-10-CM | POA: Diagnosis not present

## 2017-03-06 ENCOUNTER — Ambulatory Visit: Payer: PPO | Admitting: Internal Medicine

## 2017-04-10 ENCOUNTER — Encounter: Payer: Self-pay | Admitting: Cardiovascular Disease

## 2017-04-10 ENCOUNTER — Ambulatory Visit (INDEPENDENT_AMBULATORY_CARE_PROVIDER_SITE_OTHER): Payer: PPO | Admitting: Cardiovascular Disease

## 2017-04-10 VITALS — BP 142/80 | HR 70 | Ht 62.0 in | Wt 160.0 lb

## 2017-04-10 DIAGNOSIS — I701 Atherosclerosis of renal artery: Secondary | ICD-10-CM

## 2017-04-10 DIAGNOSIS — K921 Melena: Secondary | ICD-10-CM | POA: Diagnosis not present

## 2017-04-10 DIAGNOSIS — I10 Essential (primary) hypertension: Secondary | ICD-10-CM | POA: Diagnosis not present

## 2017-04-10 DIAGNOSIS — I779 Disorder of arteries and arterioles, unspecified: Secondary | ICD-10-CM

## 2017-04-10 DIAGNOSIS — I739 Peripheral vascular disease, unspecified: Secondary | ICD-10-CM

## 2017-04-10 DIAGNOSIS — E039 Hypothyroidism, unspecified: Secondary | ICD-10-CM

## 2017-04-10 DIAGNOSIS — I480 Paroxysmal atrial fibrillation: Secondary | ICD-10-CM | POA: Diagnosis not present

## 2017-04-10 MED ORDER — METOPROLOL SUCCINATE ER 100 MG PO TB24: 100.0000 mg | ORAL_TABLET | Freq: Every day | ORAL | 3 refills | Status: DC

## 2017-04-10 NOTE — Progress Notes (Signed)
Patient ID: Gabriela Moyer, female   DOB: 05-Jun-1941, 76 y.o.   MRN: [id number]    PCP: Dr. Neale Burly  HPI: Gabriela Moyer is a 76 y.o. female presents to the office for a 7 month cardiology evaluation.    [name] has a history of diffuse peripheral vascular disease involving her subclavian, axillary, brachial vessels as well as renal arteries. In July 2010 she underwent right renal artery stenting and at that time at 80 mm ostial gradient. 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 Additional problems also include hypertension as well as hyperlipidemia. She saw  Dr. Allyson Sabal in April 2014 for followup of her peripheral vascular disease. In September 2014 she underwent recent Doppler study which again confirmed bilateral brachial pressures consistent with right upper extremity inflow disease. There was a large amount of diffuse atherosclerotic plaque with elevated velocities in the right distal subclavian and axillary arteries concordant with her known 70-99% diameter reduction. She also had moderate amount of diffuse atherosclerotic plaque with elevated velocities consistent with a 50-69% diameter reduction in her left distal subclavian/axillary and brachial arteries.  She has a history of hypothyroidism on Synthroid at low dose 25 mcg, as well as a history of hyperlipidemia, and has been tolerating Crestor 20 mg daily.  Laboratory in 11/2014 from Dr. Wylene Simmer revealed normal renal function with a Cr 0.7.  Thyroid function was normal.  Hemoglobin A1c was excellent at 5.4.  There is no evidence for microalbuminuria cholesterol was very good with a total close to 153, triglycerides 137, HDL 54, LDL 72.  Non-HDL cholesterol 99.  When  last saw her she had  remained stable.  She denied any chest pain or palpitations.  She underwent a follow-up renal Doppler study which showed normal bilateral kidney size.  There was greater than 60% stenosis in the right  renal artery with a PSV at 272.  She was felt to have a patent right renal artery stent.  There was a normal left renal artery.  Follow-up in one year.  Was advised.  She underwent carotid studies which showed 1-39% stenoses bilaterally and greater than 50% right external carotid stenoses.  She had stable elevated velocities in the distal right subclavian artery and had normal left subclavian artery.  Her vertebral arteries were patent with antegrade flow.  Since I last saw her, she was hospitalized overnight on 12/18/2016 when she presented with atrial fibrillation with rapid ventricular response.  She converted to sinus rhythm on diltiazem drip and was transitioned to oral diltiazem at 120 mg.  Because of her chads 2 Vascor.  She was started on Xarelto.  She had continued to be on Plavix.  On Xarelto, she had noticed blood in her stools but ultimately this resolved after she self discontinued the medication.  She has seen Hayden Pedro in the office in follow-up.  Presently, she denies any recent awareness of blood in her stools.  She is unaware of any recurrent palpitations.  She denies chest pain.  She denies shortness of breath.  She believes she is feeling well.   Past Medical History:  Diagnosis Date  . Carotid arterial disease (HCC)    a. 08/2016 Carotid U/S: 1-39% bilat ICA stenosis, >50 R ECA stenosis.  . Diastolic dysfunction    a. 11/2016 Echo: EF 65-70%, Gr1 DD.  Marland Kitchen History of stress test    a. 12/2016 Lexiscan MV: EF 73%, no ischemia/infarct.  . Hyperlipidemia   .  Hypertension   . PAF (paroxysmal atrial fibrillation) (HCC)    a. 11/2016 AF RVR in ED-->converted on IV dilt-->CHA2DS2VASc = 4-->Xarelto.  . Renal artery stenosis (HCC)    a. 06/2009 s/p R Renal Artery PTA/stenting; b. 08/2016 Renal Duplex: nl LRA, >60% RRA stenosis- f/u 1 yr.  . Subclavian arterial stenosis (HCC)    a. 08/2014 Upper Ext Duplex: R distal [place] & Ax - 70-99% diam reduction, L distal [place] & Ax - 50-69%--stable.     Past Surgical History:  Procedure Laterality Date  . CARDIAC CATHETERIZATION  11/03/2007   atherosclerotic right renal artery (Dr. Evlyn Courier)  . Carotid Doppler  12/2010   R & L ICA with small amount fibrous plaque; R distal subclav. 70-99% diameter reduction; L mid subclav 50-69% diameter reduction  . Lower Extremity Arterial Doppler  03/2008   right ABI - mild arterial insuff at rest; R CIA with less than 50% diameter reduction, R & L SFA with less than 50% diameter reduction  . NM MYOCAR PERF WALL MOTION  09/2009   bruce myoview - normal perfusion, EF 915, low risk scan  . RENAL ANGIOPLASTY  11/07/2009   5x79mm Cordis Genesis Aviator stent to ostium of right renal artery (Dr. Erlene Quan)  . Renal Artery Doppler  04/2012   celiac artery with >50% diameter reduction; R renal artery stent w/mildly elevated velocities (1-59% diameter reduction)  . TRANSTHORACIC ECHOCARDIOGRAM  08/28/2009   EF=>55%; trace TR; mild AV regurg, mild pulm valve regurg  . Upper Extremity Arterial Doppler  04/2012   right brachial pressure , left ; R distal subclavian & axillary arteries 70-99% diameter reduction; L distal subclavian/axillary & brachial arteries (50-69% diameter reduction)    Allergies  Allergen Reactions  . Codeine Nausea And Vomiting    Abdominal pain  . Contrast Media [Iodinated Diagnostic Agents] Nausea And Vomiting    Abdominal pain Caused low blood pressure  . Morphine Nausea And Vomiting    Abdominal pain    Current Outpatient Prescriptions  Medication Sig Dispense Refill  . atorvastatin (LIPITOR) 40 MG tablet Take 1 tablet (40 mg total) by mouth daily. (Patient taking differently: Take 20 mg by mouth daily at 6 PM. ) 30 tablet 5  . clopidogrel (PLAVIX) 75 MG tablet TAKE 1 TABLET BY MOUTH DAILY 90 tablet 3  . diltiazem (CARDIZEM CD) 120 MG 24 hr capsule Take 1 capsule (120 mg total) by mouth daily. 30 capsule 12  . Levothyroxine Sodium 50 MCG CAPS Take 50 mcg by mouth  daily before breakfast.     . hydrochlorothiazide (HYDRODIURIL) 25 MG tablet Take 1 tablet (25 mg total) by mouth daily. 90 tablet 3  . metoprolol succinate (TOPROL-XL) 100 MG 24 hr tablet Take 1 tablet (100 mg total) by mouth daily. Take with or immediately following a meal. 90 tablet 3   No current facility-administered medications for this visit.     Social History   Social History  . Marital status: Divorced    Spouse name: N/A  . Number of children: 2  . Years of education: 12   Occupational History  . Not on file.   Social History Main Topics  . Smoking status: Passive Smoke Exposure - Never Smoker  . Smokeless tobacco: Never Used  . Alcohol use No  . Drug use: Unknown  . Sexual activity: Not on file   Other Topics Concern  . Not on file   Social History Narrative  . No narrative on file  Family History  Problem Relation Age of Onset  . Heart disease Mother   . Stroke Mother   . Cancer Father   . Heart Problems Maternal Grandmother   . Heart Problems Maternal Grandfather   . Cancer Brother    Social history is notable in that she is divorced. She has 2 children 2 grandchildren. Has no history of tobacco use. She does not use alcohol.  ROS General: Negative; No fevers, chills, or night sweats;  HEENT: Negative; No changes in vision or hearing, sinus congestion, difficulty swallowing Pulmonary: Positive for recent URI symptoms, which have now improved.; No cough, wheezing, shortness of breath, hemoptysis Cardiovascular: Negative; No chest pain, presyncope, syncope, palpatations GI: Negative; No nausea, vomiting, diarrhea, or abdominal pain GU: Negative; No dysuria, hematuria, or difficulty voiding Musculoskeletal: Negative; no myalgias, joint pain, or weakness Hematologic/Oncology: Negative; no easy bruising, bleeding Endocrine: Positive for hypothyroidism.  Negative for diabetes.; no heat/cold intolerance Neuro: Negative; no changes in balance,  headaches Skin: Negative; No rashes or skin lesions Psychiatric: Negative; No behavioral problems, depression Sleep: Negative; No snoring, daytime sleepiness, hypersomnolence, bruxism, restless legs, hypnogognic hallucinations, no cataplexy Other comprehensive 14 point system review is negative   PE BP (!) 142/80   Pulse 70   Ht  (1.575 m)   Wt 160 lb (72.6 kg)   BMI 29.26 kg/m    Wt Readings from Last 3 Encounters:  04/10/17 160 lb (72.6 kg)  01/29/17 174 lb (78.9 kg)  01/10/17 174 lb (78.9 kg)   General: Alert, oriented, no distress.  Skin: normal turgor, no rashes HEENT: Normocephalic, atraumatic. Pupils round and reactive; sclera anicteric;no lid lag.  Nose without nasal septal hypertrophy Mouth/Parynx benign; Mallinpatti scale 3 Neck: No JVD, bilateral subclavian bruits right greater than left Chest wall: Nontender to palpation Lungs: clear to ausculatation and percussion; no wheezing or rales Heart: RRR, s1 s2 normal 1/6 systolic murmur; no diastolic murmur.  No rubs thrills or heaves.. Abdomen: soft, nontender; no hepatosplenomehaly, BS+; abdominal aorta nontender and not dilated by palpation. Pulses: bilateral subclavian bruits right greater than left. Back: No CVA tenderness Musculoskeletal: No joint tenderness Extremities:  ankle swelling, left greater than right;  no clubbing cyanosis, Homan's sign negative  Neurologic: grossly nonfocal Psychologic: normal affect and mood.  ECG (independently read by me): Normal sinus rhythm at 70 bpm.  Normal intervals.  Poor anterior R-wave progression V1 through V3.  September 2017 ECG (independently read by me): Sinus rhythm at 80 bpm.  Poor anterior R-wave progression.  Normal intervals.  June 2016 ECG (independently read by me): Normal sinus rhythm at 68 bpm.  No ectopy.  Normal intervals.  Poor progression V1 through V3.  ECG (independently read by me): Sinus rhythm 73 beats per minute.  No significant ST-T changes.   Mild early repolarization changes.  Prior 10/21/2013 ECG: Sinus rhythm at 73 beats per minute. Poor progression precordially. Mild J-point early repolarization changes inferolaterally  LABS:  BMP Latest Ref Rng & Units 12/19/2016 12/18/2016 11/08/2009  Glucose 65 - 99 mg/dL 161(W) 960(A) 540(J)  BUN 6 - 20 mg/dL 15 81(X) 91(Y)  Creatinine 0.44 - 1.00 mg/dL 7.82 9.56 2.13  Sodium 135 - 145 mmol/L 141 139 139  Potassium 3.5 - 5.1 mmol/L 3.7 3.0(L) 3.6  Chloride 101 - 111 mmol/L 105 100(L) 105  CO2 22 - 32 mmol/L Calcium 8.9 - 10.3 mg/dL 9.6 9.5 9.0   No flowsheet data found.  CBC Latest Ref Rng &  Units 12/19/2016 12/18/2016 11/08/2009  WBC 4.0 - 10.5 K/uL 7.8 7.7 10.8(H)  Hemoglobin 12.0 - 15.0 g/dL 16.1 09.6 11.3(L)  Hematocrit 36.0 - 46.0 % 41.3 43.0 32.6(L)  Platelets 150 - 400 K/uL 261 276 254   Lab Results  Component Value Date   MCV 90.6 12/19/2016   MCV 89.6 12/18/2016   MCV 92.3 11/08/2009   Lab Results  Component Value Date   TSH 6.411 (H) 12/18/2016   Lab Results  Component Value Date   HGBA1C 5.3 12/19/2016   Lipid Panel     Component Value Date/Time   CHOL 129 12/19/2016 0418   TRIG 79 12/19/2016 0418   HDL 48 12/19/2016 0418   CHOLHDL 2.7 12/19/2016 0418   VLDL 16 12/19/2016 0418   LDLCALC 65 12/19/2016 0418    RADIOLOGY: No results found.  IMPRESSION:  1. PAF (paroxysmal atrial fibrillation) (HCC)   2. Essential hypertension   3. RENAL ARTERY STENOSIS   4. Hypothyroidism, unspecified type   5. Bilateral carotid artery disease (HCC)   6. Blood in stool     ASSESSMENT AND PLAN: Ms. Margarita Mail is a 76 year old Caucasian female who remains active without restriction to her yard work or exercise.  She has a history of diffuse peripheral vascular disease and is status post stenting of a right renal artery November 2010. She has had followup duplex imaging and this has remained stable.  I reviewed with her her most recent carotid as  well as renal studies.  She was felt to have a patent renal right renal stent, but had increased PSV at 272 at the right renal artery origin.  Her blood pressure today is mildly increased at 142/80.  She has been taking Toprol 75 mg daily and I have suggested further titration up to 100 mg daily and have given her 100 mg, Toprol-XL prescription.  She had developed an episode of short-lived atrial fibrillation leading to overnight hospitalization in December 2017.  She ultimately converted to sinus rhythm with diltiazem.  She is continue taking low-dose Cardizem CD 120 mg.  She also notes some intermittent swelling and continues to be on hydrochlorothiazide 25 mg daily.  She currently continues to take Plavix which she has been on since her renal stent and with her peripheral vascular disease.  She had been on Xarelto for short-term but had noticed some blood in her stool on this therapy leading to herself discontinuance.  She denies any recurrent blood loss.  However, I have recommended a GI evaluation to make certain the anticoagulation did not unmask potential pathology in her colon.  Her ECG today shows sinus rhythm without ectopy.  She will be having a follow-up primary care visit with Dr. to [facility].  At the time she went into atrial fibrillation, her potassium was low at 3.0.  She enrolled in Weight [employer] program on February 1 and since that time has been successful with a 12 by weight loss.  I will see her in 6 months for cardiology reevaluation. Time spent: 25 minutes  Lennette Bihari, MD, [facility]  04/10/2017 12:49 PM

## 2017-04-10 NOTE — Patient Instructions (Signed)
Your physician has recommended you make the following change in your medication:   1.) the metoprolol ER has been changed from 75 mg to 100 mg daily. A new prescription has been sent to your pharmacy.  Dr Tresa Endo recommends that you follow up with your gastroenterologist.  Your physician wants you to follow-up in: 6 months or sooner if needed. You will receive a reminder letter in the mail two months in advance. If you don't receive a letter, please call our office to schedule the follow-up appointment.  If you need a refill on your cardiac medications before your next appointment, please call your pharmacy.

## 2017-07-09 DIAGNOSIS — H2589 Other age-related cataract: Secondary | ICD-10-CM | POA: Diagnosis not present

## 2017-07-09 DIAGNOSIS — E11319 Type 2 diabetes mellitus with unspecified diabetic retinopathy without macular edema: Secondary | ICD-10-CM | POA: Diagnosis not present

## 2017-07-09 DIAGNOSIS — I701 Atherosclerosis of renal artery: Secondary | ICD-10-CM | POA: Diagnosis not present

## 2017-07-09 DIAGNOSIS — Z6827 Body mass index (BMI) 27.0-27.9, adult: Secondary | ICD-10-CM | POA: Diagnosis not present

## 2017-07-09 DIAGNOSIS — E668 Other obesity: Secondary | ICD-10-CM | POA: Diagnosis not present

## 2017-07-09 DIAGNOSIS — I10 Essential (primary) hypertension: Secondary | ICD-10-CM | POA: Diagnosis not present

## 2017-07-09 DIAGNOSIS — I150 Renovascular hypertension: Secondary | ICD-10-CM | POA: Diagnosis not present

## 2017-07-09 DIAGNOSIS — I7389 Other specified peripheral vascular diseases: Secondary | ICD-10-CM | POA: Diagnosis not present

## 2017-07-09 DIAGNOSIS — E038 Other specified hypothyroidism: Secondary | ICD-10-CM | POA: Diagnosis not present

## 2017-07-09 DIAGNOSIS — E7800 Pure hypercholesterolemia, unspecified: Secondary | ICD-10-CM | POA: Diagnosis not present

## 2017-07-09 DIAGNOSIS — D692 Other nonthrombocytopenic purpura: Secondary | ICD-10-CM | POA: Diagnosis not present

## 2017-07-09 DIAGNOSIS — M859 Disorder of bone density and structure, unspecified: Secondary | ICD-10-CM | POA: Diagnosis not present

## 2017-09-22 ENCOUNTER — Other Ambulatory Visit: Payer: Self-pay | Admitting: Cardiovascular Disease

## 2017-09-22 DIAGNOSIS — I10 Essential (primary) hypertension: Secondary | ICD-10-CM

## 2017-10-01 ENCOUNTER — Ambulatory Visit (HOSPITAL_COMMUNITY)
Admission: RE | Admit: 2017-10-01 | Discharge: 2017-10-01 | Disposition: A | Discharge status: Home / Self Care | Payer: PPO | Source: Ambulatory Visit | Attending: Cardiovascular Disease | Admitting: Cardiovascular Disease

## 2017-10-01 DIAGNOSIS — I10 Essential (primary) hypertension: Secondary | ICD-10-CM | POA: Diagnosis not present

## 2017-10-01 DIAGNOSIS — I708 Atherosclerosis of other arteries: Secondary | ICD-10-CM | POA: Diagnosis not present

## 2017-10-09 ENCOUNTER — Ambulatory Visit (INDEPENDENT_AMBULATORY_CARE_PROVIDER_SITE_OTHER): Payer: PPO | Admitting: Cardiovascular Disease

## 2017-10-09 ENCOUNTER — Encounter: Payer: Self-pay | Admitting: Cardiovascular Disease

## 2017-10-09 VITALS — BP 148/82 | HR 65 | Ht 62.0 in | Wt 143.6 lb

## 2017-10-09 DIAGNOSIS — I480 Paroxysmal atrial fibrillation: Secondary | ICD-10-CM

## 2017-10-09 DIAGNOSIS — E785 Hyperlipidemia, unspecified: Secondary | ICD-10-CM | POA: Diagnosis not present

## 2017-10-09 DIAGNOSIS — I10 Essential (primary) hypertension: Secondary | ICD-10-CM | POA: Diagnosis not present

## 2017-10-09 DIAGNOSIS — I6523 Occlusion and stenosis of bilateral carotid arteries: Secondary | ICD-10-CM | POA: Diagnosis not present

## 2017-10-09 DIAGNOSIS — I701 Atherosclerosis of renal artery: Secondary | ICD-10-CM | POA: Diagnosis not present

## 2017-10-09 DIAGNOSIS — E039 Hypothyroidism, unspecified: Secondary | ICD-10-CM

## 2017-10-09 NOTE — Progress Notes (Signed)
Patient ID: Gabriela Moyer, female   DOB: 1941/05/29, 76 y.o.   MRN: [id number]    PCP: Dr. Neale Burly  HPI: Gabriela Moyer is a 76 y.o. female presents to the office for a 6 month cardiology evaluation.    Gabriela Moyer has a history of diffuse peripheral vascular disease involving her subclavian, axillary, brachial vessels as well as renal arteries. In July 2010 she underwent right renal artery stenting and at that time at 80 mm ostial gradient. 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 Additional problems also include hypertension as well as hyperlipidemia. She saw  Dr. Allyson Sabal in April 2014 for followup of her peripheral vascular disease. In September 2014 she underwent a Doppler study which again confirmed bilateral brachial pressures consistent with right upper extremity inflow disease. There was a large amount of diffuse atherosclerotic plaque with elevated velocities in the right distal subclavian and axillary arteries concordant with her known 70-99% diameter reduction. She also had moderate amount of diffuse atherosclerotic plaque with elevated velocities consistent with a 50-69% diameter reduction in her left distal subclavian/axillary and brachial arteries.  She has a history of hypothyroidism on Synthroid at low dose 25 mcg, as well as a history of hyperlipidemia, and has been tolerating Crestor 20 mg daily.  Laboratory in 11/2014 from Dr. Wylene Simmer revealed normal renal function with a Cr 0.7.  Thyroid function was normal.  Hemoglobin A1c was excellent at 5.4.  There is no evidence for microalbuminuria cholesterol was very good with a total close to 153, triglycerides 137, HDL 54, LDL 72.  Non-HDL cholesterol 99.  When  last saw her she had  remained stable.  She denied any chest pain or palpitations.  She underwent a follow-up renal Doppler study which showed normal bilateral kidney size.  There was greater than 60% stenosis in the right renal  artery with a PSV at 272.  She was felt to have a patent right renal artery stent.  There was a normal left renal artery.  She underwent carotid studies which showed 1-39% stenoses bilaterally and greater than 50% right external carotid stenoses.  She had stable elevated velocities in the distal right subclavian artery and had normal left subclavian artery.  Her vertebral arteries were patent with antegrade flow.  She was hospitalized overnight on 12/18/2016 when she presented with atrial fibrillation with rapid ventricular response.  She converted to sinus rhythm on diltiazem drip and was transitioned to oral diltiazem at 120 mg.  Because of her cha2ds2Vascore.  She was started on Xarelto.  She had continued to be on Plavix.  On Xarelto, she had noticed blood in her stools but ultimately this resolved after she self discontinued the medication.    In January 2018.  She under went a nuclear stress study, which was ordered by Marlis Edelson after he had seen her the disc was normal and showed an EF of 73%.  Since I last saw her in April 2018, she has continued to remain stable and feels well.  On October 01, 2017, she underwent Donald duplex evaluation which suggested a decrease in her previous right renal artery stenosis from greater than 60% on the prior exam to less than 60%, status post prior stenting.  There is a right lower pole renal cyst measuring 1.5 x 1.6 cm, and a left midpole cyst measuring 1.5 x 1.2 cm.  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  She  denies abdominal complaints.  She denies any chest pain or shortness of breath.  She presents for reevaluation.  Past Medical History:  Diagnosis Date  . Carotid arterial disease (HCC)    a. 08/2016 Carotid U/S: 1-39% bilat ICA stenosis, >50 R ECA stenosis.  . Diastolic dysfunction    a. 11/2016 Echo: EF 65-70%, Gr1 DD.  Marland Kitchen History of stress test    a. 12/2016 Lexiscan MV: EF 73%, no ischemia/infarct.  .  Hyperlipidemia   . Hypertension   . PAF (paroxysmal atrial fibrillation) (HCC)    a. 11/2016 AF RVR in ED-->converted on IV dilt-->CHA2DS2VASc = 4-->Xarelto.  . Renal artery stenosis (HCC)    a. 06/2009 s/p R Renal Artery PTA/stenting; b. 08/2016 Renal Duplex: nl LRA, >60% RRA stenosis- f/u 1 yr.  . Subclavian arterial stenosis (HCC)    a. 08/2014 Upper Ext Duplex: R distal [place] & Ax - 70-99% diam reduction, L distal [place] & Ax - 50-69%--stable.    Past Surgical History:  Procedure Laterality Date  . CARDIAC CATHETERIZATION  11/03/2007   atherosclerotic right renal artery (Dr. Evlyn Courier)  . Carotid Doppler  12/2010   R & L ICA with small amount fibrous plaque; R distal subclav. 70-99% diameter reduction; L mid subclav 50-69% diameter reduction  . Lower Extremity Arterial Doppler  03/2008   right ABI - mild arterial insuff at rest; R CIA with less than 50% diameter reduction, R & L SFA with less than 50% diameter reduction  . NM MYOCAR PERF WALL MOTION  09/2009   bruce myoview - normal perfusion, EF 915, low risk scan  . RENAL ANGIOPLASTY  11/07/2009   5x41mm Cordis Genesis Aviator stent to ostium of right renal artery (Dr. Erlene Quan)  . Renal Artery Doppler  04/2012   celiac artery with >50% diameter reduction; R renal artery stent w/mildly elevated velocities (1-59% diameter reduction)  . TRANSTHORACIC ECHOCARDIOGRAM  08/28/2009   EF=>55%; trace TR; mild AV regurg, mild pulm valve regurg  . Upper Extremity Arterial Doppler  04/2012   right brachial pressure , left ; R distal subclavian & axillary arteries 70-99% diameter reduction; L distal subclavian/axillary & brachial arteries (50-69% diameter reduction)    Allergies  Allergen Reactions  . Codeine Nausea And Vomiting    Abdominal pain  . Contrast Media [Iodinated Diagnostic Agents] Nausea And Vomiting    Abdominal pain Caused low blood pressure  . Morphine Nausea And Vomiting    Abdominal pain    Current Outpatient  Prescriptions  Medication Sig Dispense Refill  . atorvastatin (LIPITOR) 40 MG tablet Take 1 tablet (40 mg total) by mouth daily. (Patient taking differently: Take 20 mg by mouth daily at 6 PM. ) 30 tablet 5  . clopidogrel (PLAVIX) 75 MG tablet TAKE 1 TABLET BY MOUTH DAILY 90 tablet 3  . diltiazem (CARDIZEM CD) 120 MG 24 hr capsule Take 1 capsule (120 mg total) by mouth daily. 30 capsule 12  . hydrochlorothiazide (HYDRODIURIL) 25 MG tablet Take 1 tablet (25 mg total) by mouth daily. 90 tablet 3  . Levothyroxine Sodium 50 MCG CAPS Take 50 mcg by mouth daily before breakfast.     . metoprolol succinate (TOPROL-XL) 100 MG 24 hr tablet Take 1 tablet (100 mg total) by mouth daily. Take with or immediately following a meal. 90 tablet 3   No current facility-administered medications for this visit.     Social History   Social History  . Marital status: Divorced    Spouse name:  N/A  . Number of children: 2  . Years of education: 12   Occupational History  . Not on file.   Social History Main Topics  . Smoking status: Passive Smoke Exposure - Never Smoker  . Smokeless tobacco: Never Used  . Alcohol use No  . Drug use: Unknown  . Sexual activity: Not on file   Other Topics Concern  . Not on file   Social History Narrative  . No narrative on file    Family History  Problem Relation Age of Onset  . Heart disease Mother   . Stroke Mother   . Cancer Father   . Heart Problems Maternal Grandmother   . Heart Problems Maternal Grandfather   . Cancer Brother    Social history is notable in that she is divorced. She has 2 children 2 grandchildren. Has no history of tobacco use. She does not use alcohol.  ROS General: Negative; No fevers, chills, or night sweats;  HEENT: Negative; No changes in vision or hearing, sinus congestion, difficulty swallowing Pulmonary: Positive for URI symptoms, which improved.; No cough, wheezing, shortness of breath, hemoptysis Cardiovascular: Negative; No  chest pain, presyncope, syncope, palpatations GI: Negative; No nausea, vomiting, diarrhea, or abdominal pain GU: Negative; No dysuria, hematuria, or difficulty voiding Musculoskeletal: Negative; no myalgias, joint pain, or weakness Hematologic/Oncology: Negative; no easy bruising, bleeding Endocrine: Positive for hypothyroidism.  Negative for diabetes.; no heat/cold intolerance Neuro: Negative; no changes in balance, headaches Skin: Negative; No rashes or skin lesions Psychiatric: Negative; No behavioral problems, depression Sleep: Negative; No snoring, daytime sleepiness, hypersomnolence, bruxism, restless legs, hypnogognic hallucinations, no cataplexy Other comprehensive 14 point system review is negative   PE BP (!) 148/82   Pulse 65   Ht  (1.575 m)   Wt 143 lb 9.6 oz (65.1 kg)   BMI 26.26 kg/m    Repeat blood pressure was 140/80  Wt Readings from Last 3 Encounters:  10/09/17 143 lb 9.6 oz (65.1 kg)  04/10/17 160 lb (72.6 kg)  01/29/17 174 lb (78.9 kg)   General: Alert, oriented, no distress.  Skin: normal turgor, no rashes, warm and dry HEENT: Normocephalic, atraumatic. Pupils equal round and reactive to light; sclera anicteric; extraocular muscles intact;  Nose without nasal septal hypertrophy Mouth/Parynx benign; Mallinpatti scale  Neck: No JVD, previously noted subclavian bruits; normal carotid upstroke Lungs: clear to ausculatation and percussion; no wheezing or rales Chest wall: without tenderness to palpitation Heart: PMI not displaced, RRR, s1 s2 normal, 1/6 systolic murmur, no diastolic murmur, no rubs, gallops, thrills, or heaves Abdomen: soft, nontender; no hepatosplenomehaly, BS+; abdominal aorta nontender and not dilated by palpation. Back: no CVA tenderness Pulses 2+ , soft, no subclavian bruits, right greater than left Musculoskeletal: full range of motion, normal strength, no joint deformities Extremities: Resolution of previous ankle swelling no  clubbing cyanosis , Homan's sign negative  Neurologic: grossly nonfocal; Cranial nerves grossly wnl Psychologic: Normal mood and affect   ECG (independently read by me): Normal sinus rhythm at 61 bpm.  Mild baseline wander.  Q wave in lead 3.  QS in V1, V2.  QTc interval 461 ms.  April 2018 ECG (independently read by me): Normal sinus rhythm at 70 bpm.  Normal intervals.  Poor anterior R-wave progression V1 through V3.  September 2017 ECG (independently read by me): Sinus rhythm at 80 bpm.  Poor anterior R-wave progression.  Normal intervals.  June 2016 ECG (independently read by me): Normal sinus rhythm at 68 bpm.  No ectopy.  Normal intervals.  Poor progression V1 through V3.  ECG (independently read by me): Sinus rhythm 73 beats per minute.  No significant ST-T changes.  Mild early repolarization changes.  Prior 10/21/2013 ECG: Sinus rhythm at 73 beats per minute. Poor progression precordially. Mild J-point early repolarization changes inferolaterally  LABS:  BMP Latest Ref Rng & Units 12/19/2016 12/18/2016 11/08/2009  Glucose 65 - 99 mg/dL 161(W) 960(A) 540(J)  BUN 6 - 20 mg/dL 15 81(X) 91(Y)  Creatinine 0.44 - 1.00 mg/dL 7.82 9.56 2.13  Sodium 135 - 145 mmol/L 141 139 139  Potassium 3.5 - 5.1 mmol/L 3.7 3.0(L) 3.6  Chloride 101 - 111 mmol/L 105 100(L) 105  CO2 22 - 32 mmol/L Calcium 8.9 - 10.3 mg/dL 9.6 9.5 9.0   No flowsheet data found.  CBC Latest Ref Rng & Units 12/19/2016 12/18/2016 11/08/2009  WBC 4.0 - 10.5 K/uL 7.8 7.7 10.8(H)  Hemoglobin 12.0 - 15.0 g/dL 08.6 57.8 11.3(L)  Hematocrit 36.0 - 46.0 % 41.3 43.0 32.6(L)  Platelets 150 - 400 K/uL 261 276 254   Lab Results  Component Value Date   MCV 90.6 12/19/2016   MCV 89.6 12/18/2016   MCV 92.3 11/08/2009   Lab Results  Component Value Date   TSH 6.411 (H) 12/18/2016   Lab Results  Component Value Date   HGBA1C 5.3 12/19/2016   Lipid Panel     Component Value Date/Time   CHOL 129 12/19/2016  0418   TRIG 79 12/19/2016 0418   HDL 48 12/19/2016 0418   CHOLHDL 2.7 12/19/2016 0418   VLDL 16 12/19/2016 0418   LDLCALC 65 12/19/2016 0418    RADIOLOGY: No results found.  IMPRESSION:  1. Essential hypertension   2. Renal artery stenosis, status post right renal artery stent   3. PAF (paroxysmal atrial fibrillation) (HCC)   4. Bilateral carotid artery stenosis   5. Hyperlipidemia with target LDL less than 70   6. Hypothyroidism, unspecified type     ASSESSMENT AND PLAN: [name] is a 76 year old Caucasian female who remains active without restriction to her yard work or exercise.  She has a history of diffuse peripheral vascular disease and is status post stenting of a right renal artery November 2010. She has had followup duplex imaging and this has remained stable. Her most recent renal studies revealed slight improvement in her previous greater than 60% right renal artery stenosis.  There was less than 60% stenosis on the present study and also in the left renal artery.  She continues to have stenosis in her superior mesenteric.  She is without intestinal symptomatology.  Her largest aortic hematocrit diameter was 2.5 cm.  I commended her on her weight loss and since 12/31/2016.  She has lost 31 pounds from 174 to her current rate of 143.  Her blood pressure today remains stable and on repeat was 140/80.  She continues to be on metoprolol, succinate 100 mg daily and HCTZ 25 mg in addition to diltiazem CD 120 mg.  She has not had any episodes of recurrent atrial fibrillation.  She is no longer on systemic anticoagulation and is now on Plavix.  Her peripheral edema has resolved.  She has a history of hypothyroidism and is on levothyroxine at 50 g.  She continues to be on atorvastatin for hyperlipidemia with target LDL less than 70.  She has remained stable without chest pain, shortness of breath or palpitations.  She has been  active and swims almost daily in addition to riding a  stationary bicycle.  She will be seeing Dr. Neale Burly in 6 months and I will see her in one year for reevaluation.  Time spent: 25 minutes  Lennette Bihari, MD, [facility]  10/11/2017 2:59 PM

## 2017-10-09 NOTE — Patient Instructions (Signed)

## 2017-10-22 ENCOUNTER — Other Ambulatory Visit: Payer: Self-pay | Admitting: *Deleted

## 2017-10-22 DIAGNOSIS — I701 Atherosclerosis of renal artery: Secondary | ICD-10-CM

## 2017-11-12 ENCOUNTER — Other Ambulatory Visit: Payer: Self-pay | Admitting: Cardiovascular Disease

## 2017-11-28 ENCOUNTER — Other Ambulatory Visit: Payer: Self-pay | Admitting: Pharmacist Clinician (PhC)/ Clinical Pharmacy Specialist

## 2017-11-28 ENCOUNTER — Other Ambulatory Visit: Payer: Self-pay | Admitting: Nurse Practitioner

## 2017-11-28 DIAGNOSIS — E785 Hyperlipidemia, unspecified: Secondary | ICD-10-CM

## 2017-11-28 NOTE — Telephone Encounter (Signed)
This is Dr. Kelly's pt. °

## 2017-12-01 ENCOUNTER — Other Ambulatory Visit: Payer: Self-pay

## 2017-12-01 DIAGNOSIS — E785 Hyperlipidemia, unspecified: Secondary | ICD-10-CM

## 2017-12-01 MED ORDER — ATORVASTATIN CALCIUM 40 MG PO TABS: 40.0000 mg | ORAL_TABLET | Freq: Every day | ORAL | 9 refills | Status: DC

## 2017-12-29 DIAGNOSIS — E7800 Pure hypercholesterolemia, unspecified: Secondary | ICD-10-CM | POA: Diagnosis not present

## 2017-12-29 DIAGNOSIS — Z7689 Persons encountering health services in other specified circumstances: Secondary | ICD-10-CM | POA: Diagnosis not present

## 2017-12-29 DIAGNOSIS — M859 Disorder of bone density and structure, unspecified: Secondary | ICD-10-CM | POA: Diagnosis not present

## 2017-12-29 DIAGNOSIS — I10 Essential (primary) hypertension: Secondary | ICD-10-CM | POA: Diagnosis not present

## 2017-12-29 DIAGNOSIS — R7302 Impaired glucose tolerance (oral): Secondary | ICD-10-CM | POA: Diagnosis not present

## 2017-12-29 DIAGNOSIS — E038 Other specified hypothyroidism: Secondary | ICD-10-CM | POA: Diagnosis not present

## 2018-01-05 DIAGNOSIS — Z6826 Body mass index (BMI) 26.0-26.9, adult: Secondary | ICD-10-CM | POA: Diagnosis not present

## 2018-01-05 DIAGNOSIS — R945 Abnormal results of liver function studies: Secondary | ICD-10-CM | POA: Diagnosis not present

## 2018-01-05 DIAGNOSIS — I7389 Other specified peripheral vascular diseases: Secondary | ICD-10-CM | POA: Diagnosis not present

## 2018-01-05 DIAGNOSIS — D692 Other nonthrombocytopenic purpura: Secondary | ICD-10-CM | POA: Diagnosis not present

## 2018-01-05 DIAGNOSIS — E11319 Type 2 diabetes mellitus with unspecified diabetic retinopathy without macular edema: Secondary | ICD-10-CM | POA: Diagnosis not present

## 2018-01-05 DIAGNOSIS — I701 Atherosclerosis of renal artery: Secondary | ICD-10-CM | POA: Diagnosis not present

## 2018-01-05 DIAGNOSIS — E668 Other obesity: Secondary | ICD-10-CM | POA: Diagnosis not present

## 2018-01-05 DIAGNOSIS — Z1389 Encounter for screening for other disorder: Secondary | ICD-10-CM | POA: Diagnosis not present

## 2018-01-05 DIAGNOSIS — M859 Disorder of bone density and structure, unspecified: Secondary | ICD-10-CM | POA: Diagnosis not present

## 2018-01-05 DIAGNOSIS — I150 Renovascular hypertension: Secondary | ICD-10-CM | POA: Diagnosis not present

## 2018-01-05 DIAGNOSIS — E038 Other specified hypothyroidism: Secondary | ICD-10-CM | POA: Diagnosis not present

## 2018-01-05 DIAGNOSIS — Z0000 Encounter for general adult medical examination without abnormal findings: Secondary | ICD-10-CM | POA: Diagnosis not present

## 2018-01-06 ENCOUNTER — Other Ambulatory Visit: Payer: Self-pay | Admitting: Internal Medicine

## 2018-01-06 DIAGNOSIS — R945 Abnormal results of liver function studies: Secondary | ICD-10-CM

## 2018-01-09 ENCOUNTER — Ambulatory Visit
Admission: RE | Admit: 2018-01-09 | Discharge: 2018-01-09 | Disposition: A | Discharge status: Home / Self Care | Payer: PPO | Source: Ambulatory Visit | Attending: Internal Medicine | Admitting: Internal Medicine

## 2018-01-09 DIAGNOSIS — R7989 Other specified abnormal findings of blood chemistry: Secondary | ICD-10-CM | POA: Diagnosis not present

## 2018-01-09 DIAGNOSIS — R945 Abnormal results of liver function studies: Secondary | ICD-10-CM

## 2018-01-12 ENCOUNTER — Other Ambulatory Visit: Payer: Self-pay | Admitting: Cardiovascular Disease

## 2018-01-14 ENCOUNTER — Encounter: Payer: Self-pay | Admitting: Internal Medicine

## 2018-01-14 ENCOUNTER — Other Ambulatory Visit: Payer: Self-pay | Admitting: *Deleted

## 2018-01-14 MED ORDER — DILTIAZEM HCL ER COATED BEADS 120 MG PO CP24: 120.0000 mg | ORAL_CAPSULE | Freq: Every day | ORAL | 3 refills | Status: DC

## 2018-01-14 NOTE — Telephone Encounter (Signed)
Follow up   Pharmacy calling to request 90 day supply for patient. She is going out of town.    *STAT* If patient is at the pharmacy, call can be transferred to refill team.   1. Which medications need to be refilled? (please list name of each medication and dose if known) CARTIA XT 120 MG 24 hr capsule  2. Which pharmacy/location (including street and city if local pharmacy) is medication to be sent to? [facility]  3. Do they need a 30 day or 90 day supply? 90

## 2018-02-20 DIAGNOSIS — R945 Abnormal results of liver function studies: Secondary | ICD-10-CM | POA: Diagnosis not present

## 2018-02-21 ENCOUNTER — Other Ambulatory Visit: Payer: Self-pay | Admitting: Cardiovascular Disease

## 2018-02-23 NOTE — Telephone Encounter (Signed)
REFILL 

## 2018-02-27 ENCOUNTER — Encounter (HOSPITAL_COMMUNITY): Payer: Self-pay | Admitting: Emergency Medicine

## 2018-02-27 ENCOUNTER — Ambulatory Visit: Payer: PPO | Admitting: Internal Medicine

## 2018-02-27 ENCOUNTER — Emergency Department (HOSPITAL_COMMUNITY)
Admission: EM | Admit: 2018-02-27 | Discharge: 2018-02-28 | Disposition: A | Discharge status: Home / Self Care | Payer: PPO | Attending: Emergency Medicine | Admitting: Emergency Medicine

## 2018-02-27 ENCOUNTER — Other Ambulatory Visit: Payer: Self-pay

## 2018-02-27 ENCOUNTER — Emergency Department (HOSPITAL_COMMUNITY): Payer: PPO

## 2018-02-27 DIAGNOSIS — R079 Chest pain, unspecified: Secondary | ICD-10-CM | POA: Diagnosis not present

## 2018-02-27 DIAGNOSIS — I4891 Unspecified atrial fibrillation: Secondary | ICD-10-CM | POA: Diagnosis not present

## 2018-02-27 DIAGNOSIS — I110 Hypertensive heart disease with heart failure: Secondary | ICD-10-CM | POA: Diagnosis not present

## 2018-02-27 DIAGNOSIS — Z7902 Long term (current) use of antithrombotics/antiplatelets: Secondary | ICD-10-CM | POA: Insufficient documentation

## 2018-02-27 DIAGNOSIS — Z79899 Other long term (current) drug therapy: Secondary | ICD-10-CM | POA: Insufficient documentation

## 2018-02-27 DIAGNOSIS — I5032 Chronic diastolic (congestive) heart failure: Secondary | ICD-10-CM | POA: Diagnosis not present

## 2018-02-27 DIAGNOSIS — Z7722 Contact with and (suspected) exposure to environmental tobacco smoke (acute) (chronic): Secondary | ICD-10-CM | POA: Diagnosis not present

## 2018-02-27 DIAGNOSIS — R002 Palpitations: Secondary | ICD-10-CM | POA: Diagnosis not present

## 2018-02-27 DIAGNOSIS — I480 Paroxysmal atrial fibrillation: Secondary | ICD-10-CM | POA: Insufficient documentation

## 2018-02-27 DIAGNOSIS — E785 Hyperlipidemia, unspecified: Secondary | ICD-10-CM | POA: Diagnosis not present

## 2018-02-27 LAB — CBC
HEMATOCRIT: 41.6 % (ref 36.0–46.0)
Hemoglobin: 13.7 g/dL (ref 12.0–15.0)
MCH: 31.1 pg (ref 26.0–34.0)
MCHC: 32.9 g/dL (ref 30.0–36.0)
MCV: 94.5 fL (ref 78.0–100.0)
PLATELETS: 271 10*3/uL (ref 150–400)
RBC: 4.4 MIL/uL (ref 3.87–5.11)
RDW: 12.3 % (ref 11.5–15.5)
WBC: 7.3 10*3/uL (ref 4.0–10.5)

## 2018-02-27 LAB — BASIC METABOLIC PANEL
Anion gap: 11 (ref 5–15)
BUN: 26 mg/dL — AB (ref 6–20)
CHLORIDE: 103 mmol/L (ref 101–111)
CO2: 26 mmol/L (ref 22–32)
CREATININE: 0.84 mg/dL (ref 0.44–1.00)
Calcium: 9.4 mg/dL (ref 8.9–10.3)
GFR calc Af Amer: >60 mL/min (ref >60)
GFR calc non Af Amer: >60 mL/min (ref >60)
Glucose, Bld: 112 mg/dL — ABNORMAL HIGH (ref 65–99)
Potassium: 4.2 mmol/L (ref 3.5–5.1)
Sodium: 140 mmol/L (ref 135–145)

## 2018-02-27 MED ORDER — DILTIAZEM HCL-DEXTROSE 100-5 MG/100ML-% IV SOLN (PREMIX)
5.0000 mg/h | INTRAVENOUS | Status: DC
  Administered 2018-02-27: 5 mg/h via INTRAVENOUS
  Filled 2018-02-27: qty 100

## 2018-02-27 MED ORDER — DILTIAZEM LOAD VIA INFUSION
20.0000 mg | Freq: Once | INTRAVENOUS | Status: AC
  Administered 2018-02-27: 20 mg via INTRAVENOUS
  Filled 2018-02-27: qty 20

## 2018-02-27 NOTE — ED Notes (Signed)
Pt endorses relief after taking her nightly cardizem.

## 2018-02-27 NOTE — ED Triage Notes (Signed)
Report from [facility]> Pt reports heart "flipping" in her chest since 9pm.  HR 90-140 per EMS.  Denies pain, sob, and nausea.  Hx of afib.

## 2018-02-28 LAB — I-STAT TROPONIN, ED: TROPONIN I, POC: 0.01 ng/mL (ref 0.00–0.08)

## 2018-02-28 MED ORDER — FLECAINIDE ACETATE 100 MG PO TABS
300.0000 mg | ORAL_TABLET | Freq: Once | ORAL | Status: AC
  Administered 2018-02-28: 300 mg via ORAL
  Filled 2018-02-28: qty 3

## 2018-02-28 NOTE — ED Provider Notes (Signed)
[facility] EMERGENCY DEPARTMENT Provider Note   CSN: [id number] Arrival date & time: 02/27/18  2215     History   Chief Complaint Chief Complaint  Patient presents with  . Atrial Fibrillation    HPI Gabriela Moyer is a 77 y.o. female.  HPI 77 year old female with a history of paroxysmal atrial fibrillation on aspirin and Plavix who presents the emergency department with acute onset palpitations this evening while watching TV.  She reports compliance with her medications.  She took her nighttime meds early in attempt to control her rhythm but continued having palpitations and thus came to the ER for evaluation.  She contacted the paramedics.  She denies chest pain.  No shortness of breath.  Denies nausea.  She has never been cardioverted.  She was admitted approximately a year ago and converted with IV Cardizem.   Past Medical History:  Diagnosis Date  . Carotid arterial disease (HCC)    a. 08/2016 Carotid U/S: 1-39% bilat ICA stenosis, >50 R ECA stenosis.  . Diastolic dysfunction    a. 11/2016 Echo: EF 65-70%, Gr1 DD.  Marland Kitchen History of stress test    a. 12/2016 Lexiscan MV: EF 73%, no ischemia/infarct.  . Hyperlipidemia   . Hypertension   . PAF (paroxysmal atrial fibrillation) (HCC)    a. 11/2016 AF RVR in ED-->converted on IV dilt-->CHA2DS2VASc = 4-->Xarelto.  . Renal artery stenosis (HCC)    a. 06/2009 s/p R Renal Artery PTA/stenting; b. 08/2016 Renal Duplex: nl LRA, >60% RRA stenosis- f/u 1 yr.  . Subclavian arterial stenosis (HCC)    a. 08/2014 Upper Ext Duplex: R distal [place] & Ax - 70-99% diam reduction, L distal [place] & Ax - 50-69%--stable.    Patient Active Problem List   Diagnosis Date Noted  . Afib (HCC) 12/18/2016  . Atrial fibrillation with RVR (HCC) 12/18/2016  . Ankle swelling 06/10/2015  . Hyperlipidemia LDL goal < 70 04/28/2014  . 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 Hypothyroidism 10/31/2013  . Essential hypertension 07/26/2008  .  RENAL ARTERY STENOSIS 07/26/2008  . 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 CARDIAC CATHETERIZATION  11/03/2007   atherosclerotic right renal artery (Dr. Evlyn Courier)  . Carotid Doppler  12/2010   R & L ICA with small amount fibrous plaque; R distal subclav. 70-99% diameter reduction; L mid subclav 50-69% diameter reduction  . Lower Extremity Arterial Doppler  03/2008   right ABI - mild arterial insuff at rest; R CIA with less than 50% diameter reduction, R & L SFA with less than 50% diameter reduction  . NM MYOCAR PERF WALL MOTION  09/2009   bruce myoview - normal perfusion, EF 915, low risk scan  . RENAL ANGIOPLASTY  11/07/2009   5x58mm Cordis Genesis Aviator stent to ostium of right renal artery (Dr. Erlene Quan)  . Renal Artery Doppler  04/2012   celiac artery with >50% diameter reduction; R renal artery stent w/mildly elevated velocities (1-59% diameter reduction)  . TRANSTHORACIC ECHOCARDIOGRAM  08/28/2009   EF=>55%; trace TR; mild AV regurg, mild pulm valve regurg  . Upper Extremity Arterial Doppler  04/2012   right brachial pressure , left ; R distal subclavian & axillary arteries 70-99% diameter reduction; L distal subclavian/axillary & brachial arteries (50-69% diameter reduction)    OB History    No data available       Home Medications    Prior to  Admission medications   Medication Sig Start Date End Date Taking? Authorizing Provider  atorvastatin (LIPITOR) 40 MG tablet Take 1 tablet (40 mg total) by mouth daily. 12/01/17   Lennette BihariKelly, Thomas A, MD  clopidogrel (PLAVIX) 75 MG tablet TAKE 1 TABLET BY MOUTH DAILY 02/23/18   Lennette BihariKelly, Thomas A, MD  diltiazem (CARTIA XT) 120 MG 24 hr capsule Take 1 capsule (120 mg total) by mouth daily. 01/14/18   Lennette BihariKelly, Thomas A, MD  hydrochlorothiazide (HYDRODIURIL) 25 MG tablet Take 1 tablet (25 mg total) by mouth daily. 11/28/17   Lennette BihariKelly, Thomas A, MD  Levothyroxine  Sodium 50 MCG CAPS Take 50 mcg by mouth daily before breakfast.     [provider]  metoprolol succinate (TOPROL-XL) 100 MG 24 hr tablet Take 1 tablet (100 mg total) by mouth daily. Take with or immediately following a meal. 04/10/17 07/09/17  Lennette BihariKelly, Thomas A, MD    Family History Family History  Problem Relation Age of Onset  . Heart disease Mother   . Stroke Mother   . Cancer Father   . Heart Problems Maternal Grandmother   . Heart Problems Maternal Grandfather   . Cancer Brother     Social History Social History   Tobacco Use  . Smoking status: Passive Smoke Exposure - Never Smoker  . Smokeless tobacco: Never Used  Substance Use Topics  . Alcohol use: No  . Drug use: Not on file     Allergies   Codeine; Contrast media [iodinated diagnostic agents]; and Morphine   Review of Systems Review of Systems  All other systems reviewed and are negative.    Physical Exam Updated Vital Signs BP (!) 121/45   Pulse 71   Resp 16   Ht 5\' 2"  (1.575 m)   Wt 64.9 kg (143 lb)   SpO2 96%   BMI 26.16 kg/m   Physical Exam  Constitutional: She is oriented to person, place, and time. She appears well-developed and well-nourished. No distress.  HENT:  Head: Normocephalic and atraumatic.  Eyes: EOM are normal.  Neck: Normal range of motion.  Cardiovascular:  Tachycardic.  Irregularly irregular.  Pulmonary/Chest: Effort normal and breath sounds normal.  Abdominal: Soft. She exhibits no distension. There is no tenderness.  Musculoskeletal: Normal range of motion.  Neurological: She is alert and oriented to person, place, and time.  Skin: Skin is warm and dry.  Psychiatric: She has a normal mood and affect. Judgment normal.  Nursing note and vitals reviewed.    ED Treatments / Results  Labs (all labs ordered are listed, but only abnormal results are displayed) Labs Reviewed  BASIC METABOLIC PANEL - Abnormal; Notable for the following components:      Result Value     Glucose, Bld 112 (*)    BUN 26 (*)    All other components within normal limits  CBC  I-STAT TROPONIN, ED    EKG  EKG Interpretation  Date/Time:  Friday February 27 2018 22:26:33 EST Ventricular Rate:  93 PR Interval:    QRS Duration: 103 QT Interval:  373 QTC Calculation: 464 R Axis:   66 Text Interpretation:  Atrial fibrillation Abnormal T, consider ischemia, lateral leads changed from 11/2016 ecg. afib new since then Confirmed by Azalia Bilisampos, Giavana Rooke ([id number]) on 02/27/2018 11:06:47 PM       Radiology Dg Chest 2 View  Result Date: 02/27/2018 CLINICAL DATA:  Atrial fibrillation EXAM: CHEST  2 VIEW COMPARISON:  12/18/2016 FINDINGS: The heart size and mediastinal contours are within  normal limits. Both lungs are clear. Aortic atherosclerosis. Degenerative changes of the spine. IMPRESSION: No active cardiopulmonary disease. Electronically Signed   By: Jasmine Pang M.D.   On: 02/27/2018 23:23    Procedures Procedures (including critical care time)  Medications Ordered in ED Medications  diltiazem (CARDIZEM) 1 mg/mL load via infusion 20 mg (20 mg Intravenous Bolus from Bag 02/27/18 2359)    And  diltiazem (CARDIZEM) 100 mg in dextrose 5% (1 mg/mL) infusion (0 mg/hr Intravenous Stopped 02/28/18 0023)  flecainide (TAMBOCOR) tablet 300 mg (300 mg Oral Given 02/28/18 0253)     Initial Impression / Assessment and Plan / ED Course  I have reviewed the triage vital signs and the nursing notes.  Pertinent labs & imaging results that were available during my care of the patient were reviewed by me and considered in my medical decision making (see chart for details).     IV Cardizem led to some bradycardia and hypotension but did not convert her to sinus rhythm.  Cardizem was discontinued.  Patient was tried on flecainide for the first time and converted to sinus rhythm approximately 1 hour after initiation of oral flecainide (300 mg)  Patient be discharged home at this time.  She remains in  sinus rhythm at this time. Will discuss with her cardiologist if flecainide at home is an option for her for her symptomatic paroxysmal A. Fib  CHA2DS2/VAS Stroke Risk Points      5 >= 2 Points: High Risk  1 - 1.99 Points: Medium Risk  0 Points: Low Risk    This is the only CHA2DS2/VAS Stroke Risk Points available for the past  year.:  Change: N/A     Details    This score determines the patient's risk of having a stroke if the  patient has atrial fibrillation.       Points Metrics  1 Has Congestive Heart Failure:  Yes   0 Has Vascular Disease:  No   1 Has Hypertension:  Yes   2 Age:  71   0 Has Diabetes:  No   0 Had Stroke:  No  Had TIA:  No  Had thromboembolism:  No   1 Female:  Yes             Final Clinical Impressions(s) / ED Diagnoses   Final diagnoses:  Atrial fibrillation with RVR [facility])    ED Discharge Orders    None       Azalia Bilis, MD 02/28/18 [id number]

## 2018-02-28 NOTE — ED Notes (Signed)
Pt converted to sinus rhythm

## 2018-02-28 NOTE — Discharge Instructions (Signed)
Talk to your cardiologist about Flecainide ("pill in the pocket").

## 2018-02-28 NOTE — ED Notes (Signed)
Pt not tolerating Cardizem, brady and hypotensive, MD aware

## 2018-03-01 ENCOUNTER — Telehealth (HOSPITAL_COMMUNITY): Payer: Self-pay

## 2018-03-01 NOTE — ED Provider Notes (Signed)
MUSE over read, 02/28/17, EKG is abnormal. Query AMI. Charge nurse, Blue, contacted and will have Patient contacted, and return for reevaluation.   Mancel BaleWentz, Kollins Fenter, MD 03/01/18 1106

## 2018-03-02 ENCOUNTER — Telehealth: Payer: Self-pay | Admitting: Cardiovascular Disease

## 2018-03-02 NOTE — Progress Notes (Signed)
Cardiology Office Note   Date:  03/03/2018   ID:  Gabriela, Moyer 09-18-1941, MRN [id number]  PCP:  Gaspar Garbe, MD  Cardiologist: Tresa Endo  Chief Complaint  Patient presents with  . Hypertension  . Coronary Artery Disease  . Atrial Fibrillation     History of Present Illness: Gabriela Moyer is a 77 y.o. female who presents for ongoing assessment and management of hypertension, paroxysmal atrial fibrillation, diffuse peripheral vascular disease involving her subclavian, axillary, brachial vessels as well as renal artery. She is status post renal artery stenting 06/2009.    She saw  Dr. Allyson Sabal in April 2014 for followup of her peripheral vascular disease. In September 2014 she underwent a Doppler study which again confirmed bilateral brachial pressures consistent with right upper extremity inflow disease. There was a large amount of diffuse atherosclerotic plaque with elevated velocities in the right distal subclavian and axillary arteries concordant with her known 70-99% diameter reduction. She also had moderate amount of diffuse atherosclerotic plaque with elevated velocities consistent with a 50-69% diameter reduction in her left distal subclavian/axillary and brachial arteries.  Had follow-up Doppler ultrasound to evaluate ongoing renal artery stenosis, status post stenting.There is a right lower pole renal cyst measuring 1.5 x 1.6 cm, and a left midpole cyst measuring 1.5 x 1.2 cm.  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  Was last seen by Dr. Tresa Endo on 10/09/2017 she had had no recurrence of rapid heart rhythm, or irregular heart rhythm. She was found be in normal sinus rhythm at that time. She is no longer on anticoagulation therapy. The patient was doing well and had purposely lost 30 pounds.  He called our office today stating that she fell rapid irregular heart rhythm, resolving on its own requesting an appointment. The patient was seen in  the emergency room on 02/28/2018 due to symptoms. She felt that she had gone into atrial fibrillation. This was confirmed by EKG in ER. Patient was treated with IV diltiazem which did not control her A. fib or convert her. She was given 3 doses of flecainide by mouth. Follow-up EKG revealed sinus pause and then conversion to normal sinus rhythm. Follow-up EKG today reveals sinus bradycardia, heart rate of 52 bpm, without acute ST-T wave changes.   So, she stopped taking Crestor as her most recent labs in January revealed elevated LFTs. She has probably a copy of his labs. AST was 56 ALT was 69 alkaline phosphatase 471. After stopping Crestor for approximately 6 weeks repeat LFTs were drawn and had normalized. Does not wish to take Crestor any longer.  Past Medical History:  Diagnosis Date  . Carotid arterial disease (HCC)    a. 08/2016 Carotid U/S: 1-39% bilat ICA stenosis, >50 R ECA stenosis.  . Diastolic dysfunction    a. 11/2016 Echo: EF 65-70%, Gr1 DD.  Marland Kitchen History of stress test    a. 12/2016 Lexiscan MV: EF 73%, no ischemia/infarct.  . Hyperlipidemia   . Hypertension   . PAF (paroxysmal atrial fibrillation) (HCC)    a. 11/2016 AF RVR in ED-->converted on IV dilt-->CHA2DS2VASc = 4-->Xarelto.  . Renal artery stenosis (HCC)    a. 06/2009 s/p R Renal Artery PTA/stenting; b. 08/2016 Renal Duplex: nl LRA, >60% RRA stenosis- f/u 1 yr.  . Subclavian arterial stenosis (HCC)    a. 08/2014 Upper Ext Duplex: R distal Kimball & Ax - 70-99% diam reduction, L distal Crozier & Ax - 50-69%--stable.  Past Surgical History:  Procedure Laterality Date  . CARDIAC CATHETERIZATION  11/03/2007   atherosclerotic right renal artery (Dr. Evlyn CourierJ. Gangi)  . Carotid Doppler  12/2010   R & L ICA with small amount fibrous plaque; R distal subclav. 70-99% diameter reduction; L mid subclav 50-69% diameter reduction  . Lower Extremity Arterial Doppler  03/2008   right ABI - mild arterial insuff at rest; R CIA with less than 50% diameter  reduction, R & L SFA with less than 50% diameter reduction  . NM MYOCAR PERF WALL MOTION  09/2009   bruce myoview - normal perfusion, EF 915, low risk scan  . RENAL ANGIOPLASTY  11/07/2009   [id number] Cordis Genesis Aviator stent to ostium of right renal artery (Dr. Erlene QuanJ. Berry)  . Renal Artery Doppler  04/2012   celiac artery with >50% diameter reduction; R renal artery stent w/mildly elevated velocities (1-59% diameter reduction)  . TRANSTHORACIC ECHOCARDIOGRAM  08/28/2009   EF=>55%; trace TR; mild AV regurg, mild pulm valve regurg  . Upper Extremity Arterial Doppler  04/2012   right brachial pressure 96mmHg, left 120mmHg; R distal subclavian & axillary arteries 70-99% diameter reduction; L distal subclavian/axillary & brachial arteries (50-69% diameter reduction)     Current Outpatient Medications  Medication Sig Dispense Refill  . atorvastatin (LIPITOR) 40 MG tablet Take 1 tablet (40 mg total) by mouth daily. 30 tablet 9  . clopidogrel (PLAVIX) 75 MG tablet TAKE 1 TABLET BY MOUTH DAILY 90 tablet 3  . diltiazem (CARTIA XT) 120 MG 24 hr capsule Take 1 capsule (120 mg total) by mouth daily. 90 capsule 3  . hydrochlorothiazide (HYDRODIURIL) 25 MG tablet Take 1 tablet (25 mg total) by mouth daily. 90 tablet 3  . Levothyroxine Sodium 50 MCG CAPS Take 50 mcg by mouth daily before breakfast.     . metoprolol succinate (TOPROL-XL) 100 MG 24 hr tablet Take 1 tablet (100 mg total) by mouth daily. Take with or immediately following a meal. 90 tablet 3   No current facility-administered medications for this visit.     Allergies:   Codeine; Contrast media [iodinated diagnostic agents]; and Morphine    Social History:  The patient  reports that she is a non-smoker but has been exposed to tobacco smoke. she has never used smokeless tobacco. She reports that she does not drink alcohol.   Family History:  The patient's family history includes Cancer in her brother and father; Heart Problems in her maternal  grandfather and maternal grandmother; Heart disease in her mother; Stroke in her mother.    ROS: All other systems are reviewed and negative. Unless otherwise mentioned in H&P    PHYSICAL EXAM: VS:  BP 122/72   Pulse (!) 52   Ht 5\' 2"  (1.575 m)   Wt 152 lb 3.2 oz (69 kg)   BMI 27.84 kg/m  , BMI Body mass index is 27.84 kg/m. GEN: Well nourished, well developed, in no acute distress  HEENT: normal  Neck: no JVD, carotid bruits, or masses Cardiac: RRR; no murmurs, rubs, or gallops,no edema  Respiratory:  clear to auscultation bilaterally, normal work of breathing GI: soft, nontender, nondistended, + BS MS: no deformity or atrophy  Skin: warm and dry, no rash Neuro:  Strength and sensation are intact Psych: euthymic mood, full affect   EKG:  Sinus bradycardia heart rate of 52 bpm. No ST or T-wave abnormalities  Recent Labs: 02/27/2018: BUN 26; Creatinine, Ser 0.84; Hemoglobin 13.7; Platelets 271; Potassium 4.2; Sodium 140  Lipid Panel    Component Value Date/Time   CHOL 129 12/19/2016 0418   TRIG 79 12/19/2016 0418   HDL 48 12/19/2016 0418   CHOLHDL 2.7 12/19/2016 0418   VLDL 16 12/19/2016 0418   LDLCALC 65 12/19/2016 0418      Wt Readings from Last 3 Encounters:  03/03/18 152 lb 3.2 oz (69 kg)  02/27/18 143 lb (64.9 kg)  10/09/17 143 lb 9.6 oz (65.1 kg)      Other studies Reviewed: Echocardiogram 12/20/16 Left ventricle: The cavity size was normal. There was mild focal   basal hypertrophy of the septum. Systolic function was vigorous.   The estimated ejection fraction was in the range of 65% to 70%.   Wall motion was normal; there were no regional wall motion   abnormalities. Doppler parameters are consistent with abnormal   left ventricular relaxation (grade 1 diastolic dysfunction).   There was no evidence of elevated ventricular filling pressure by   Doppler parameters. - Aortic valve: Trileaflet; normal thickness leaflets. There was no    regurgitation. - Aortic root: The aortic root was normal in size. - Mitral valve: Structurally normal valve. There was no   regurgitation. - Left atrium: The atrium was normal in size. - Right ventricle: Systolic function was normal. - Right atrium: The atrium was normal in size. - Tricuspid valve: There was no regurgitation. - Pulmonic valve: There was no regurgitation. - Inferior vena cava: The vessel was normal in size. - Pericardium, extracardiac: There was no pericardial effusion.  NM Stress Test  01/10/2017  The left ventricular ejection fraction is hyperdynamic (>65%).  Nuclear stress EF: 73%.  The study is normal.  This is a low risk study.  There was no ST segment deviation noted during stress.   Normal resting and stress perfusion. No ischemia or infarction EF 73%  ASSESSMENT AND PLAN:  1. Paroxysmal atrial fibrillation: Happening about once a year. Recently hospital ER visit for same. Was given flecainide 50 mg. Converted to normal sinus rhythm. She wishes to have a prescription for flecainide to use if she should have a reoccurrence. She only has these occurrences rarely but would feel better having it with her. I've given her a prescription for this. She does not have any documentation for coronary artery disease. He is to call us if she has had to use the flecainide. If she is having to use it frequently we may need to make adjustments in her medication and add anticoagulation therapy.  2. Peripheral arterial disease: Lower extremities. Followed by Dr. Allyson Sabal. She will continue on aspirin and Plavix.. Persistent symptoms of leg pain, or intermittent claudication.  3. Hypercholesterolemia: Stopped taking Crestor on her own as she was given copy of her labs in all that her LFTs were elevated. Follow-up labs were completed after being off Crestor for approximately 6 weeks with normalization of LFTs.  Will start her on Pravachol 40 mg daily. She will have follow-up labs in  approximately 6 weeks to evaluate cholesterol status and kidney status.  4. Hypertension:  Pressure is well-controlled currently. No changes in her medication   Current medicines are reviewed at length with the patient today.    Labs/ tests ordered today include: Lipids, BMET.  Bettey Mare. Liborio Nixon, ANP, AACC   03/03/2018 8:28 AM    [facility] [address] [place], [place] [address] Phone: [phone]; Fax: [phone]

## 2018-03-02 NOTE — Telephone Encounter (Signed)
Busy, will try again later.

## 2018-03-02 NOTE — Telephone Encounter (Signed)
New Message:  Pt says she have been having Atrial Fib,felt she was alright now.She wanted to be seen,made her appt for  tomorrow with Joni ReiningKathryn Lawrence.Please call to evaluate.

## 2018-03-02 NOTE — Telephone Encounter (Signed)
Spoke with patient and she is feeling better, just tired. She does not think she is in Afib currently. She will keep her appointment in the morning as scheduled.

## 2018-03-03 ENCOUNTER — Ambulatory Visit: Payer: PPO | Admitting: Adult Health

## 2018-03-03 ENCOUNTER — Encounter: Payer: Self-pay | Admitting: Adult Health

## 2018-03-03 ENCOUNTER — Other Ambulatory Visit: Payer: Self-pay | Admitting: Adult Health

## 2018-03-03 VITALS — BP 122/72 | HR 52 | Ht 62.0 in | Wt 152.2 lb

## 2018-03-03 DIAGNOSIS — E785 Hyperlipidemia, unspecified: Secondary | ICD-10-CM | POA: Diagnosis not present

## 2018-03-03 DIAGNOSIS — Z79899 Other long term (current) drug therapy: Secondary | ICD-10-CM | POA: Diagnosis not present

## 2018-03-03 DIAGNOSIS — I10 Essential (primary) hypertension: Secondary | ICD-10-CM | POA: Diagnosis not present

## 2018-03-03 DIAGNOSIS — R748 Abnormal levels of other serum enzymes: Secondary | ICD-10-CM | POA: Diagnosis not present

## 2018-03-03 DIAGNOSIS — I480 Paroxysmal atrial fibrillation: Secondary | ICD-10-CM | POA: Diagnosis not present

## 2018-03-03 MED ORDER — PRAVASTATIN SODIUM 20 MG PO TABS: 20.0000 mg | ORAL_TABLET | Freq: Every evening | ORAL | 3 refills | Status: DC

## 2018-03-03 MED ORDER — FLECAINIDE ACETATE 50 MG PO TABS: 50.0000 mg | ORAL_TABLET | Freq: Two times a day (BID) | ORAL | 3 refills | Status: DC | PRN

## 2018-03-03 NOTE — Patient Instructions (Signed)
Medication Instructions:  STOP CRESTOR  START PRAVASTATIN 40MG  DAILY  TAKE FLECAINIDE 50MG  EVERY 12 HOURS AS NEEDED FOR INCREASED HR AND/OR AFIB SYMPTOMS  If you need a refill on your cardiac medications before your next appointment, please call your pharmacy.  Labwork: BMET, FLP AND  BMET IN 6 WEEKS(~04-14-2018) HERE IN OUR OFFICE AT [facility]  Take the provided lab slips for you to take with you to the lab for you blood draw.   You will need to fast. DO NOT EAT OR DRINK PAST MIDNIGHT.   Follow-Up: Your physician wants you to follow-up in: 3 MONTHS WITH DR Tresa EndoKELLY -OR- KATHRYN LAWRENCE (NURSE PRACTIONIER), DNP.    [facility] you [facility] choosing [facility]!!

## 2018-04-14 DIAGNOSIS — I4891 Unspecified atrial fibrillation: Secondary | ICD-10-CM | POA: Diagnosis not present

## 2018-04-14 DIAGNOSIS — Z79899 Other long term (current) drug therapy: Secondary | ICD-10-CM | POA: Diagnosis not present

## 2018-04-14 LAB — BASIC METABOLIC PANEL
BUN/Creatinine Ratio: 23 (ref 12–28)
BUN: 19 mg/dL (ref 8–27)
CO2: 28 mmol/L (ref 20–29)
CREATININE: 0.84 mg/dL (ref 0.57–1.00)
Calcium: 10 mg/dL (ref 8.7–10.3)
Chloride: 101 mmol/L (ref 96–106)
GFR calc Af Amer: 77 mL/min/{1.73_m2} (ref >59)
GFR calc non Af Amer: 67 mL/min/{1.73_m2} (ref >59)
Glucose: 102 mg/dL — ABNORMAL HIGH (ref 65–99)
POTASSIUM: 5.2 mmol/L (ref 3.5–5.2)
SODIUM: 144 mmol/L (ref 134–144)

## 2018-04-14 LAB — LIPID PANEL
CHOL/HDL RATIO: 2.8 ratio (ref 0.0–4.4)
Cholesterol, Total: 191 mg/dL (ref 100–199)
HDL: 68 mg/dL (ref >39)
LDL Calculated: 103 mg/dL — ABNORMAL HIGH (ref 0–99)
TRIGLYCERIDES: 98 mg/dL (ref 0–149)
VLDL CHOLESTEROL CAL: 20 mg/dL (ref 5–40)

## 2018-04-14 LAB — HEPATIC FUNCTION PANEL
ALBUMIN: 4.4 g/dL (ref 3.5–4.8)
ALK PHOS: 126 IU/L — AB (ref 39–117)
ALT: 26 IU/L (ref 0–32)
AST: 27 IU/L (ref 0–40)
Bilirubin Total: 0.3 mg/dL (ref 0.0–1.2)
Bilirubin, Direct: 0.12 mg/dL (ref 0.00–0.40)
Total Protein: 6.8 g/dL (ref 6.0–8.5)

## 2018-05-20 ENCOUNTER — Other Ambulatory Visit: Payer: Self-pay | Admitting: Cardiovascular Disease

## 2018-06-12 ENCOUNTER — Encounter: Payer: Self-pay | Admitting: Cardiovascular Disease

## 2018-06-12 ENCOUNTER — Ambulatory Visit: Payer: PPO | Admitting: Cardiovascular Disease

## 2018-06-12 VITALS — BP 132/71 | HR 62 | Ht 62.0 in | Wt 151.6 lb

## 2018-06-12 DIAGNOSIS — I701 Atherosclerosis of renal artery: Secondary | ICD-10-CM | POA: Diagnosis not present

## 2018-06-12 DIAGNOSIS — I480 Paroxysmal atrial fibrillation: Secondary | ICD-10-CM

## 2018-06-12 DIAGNOSIS — E785 Hyperlipidemia, unspecified: Secondary | ICD-10-CM

## 2018-06-12 DIAGNOSIS — I10 Essential (primary) hypertension: Secondary | ICD-10-CM

## 2018-06-12 DIAGNOSIS — Z79899 Other long term (current) drug therapy: Secondary | ICD-10-CM | POA: Diagnosis not present

## 2018-06-12 DIAGNOSIS — I739 Peripheral vascular disease, unspecified: Secondary | ICD-10-CM

## 2018-06-12 MED ORDER — EZETIMIBE 10 MG PO TABS: 10.0000 mg | ORAL_TABLET | Freq: Every day | ORAL | 3 refills | Status: DC
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
## 2018-06-12 NOTE — Patient Instructions (Addendum)
Medication Instructions:  START Zetia 10 mg daily  Labwork: Please return for FASTING labs in 3 months (CMET, Lipid)  Our in office lab hours are Monday-Friday 8:00-4:00, closed for lunch 12:45-1:45 pm.  No appointment needed.  Follow-Up: Your physician wants you to follow-up in: Your physician wants you to follow-up in: 6 months with Dr. Tresa EndoKelly.  You will receive a reminder letter in the mail two months in advance.     If you need a refill on your cardiac medications before your next appointment, please call your pharmacy.

## 2018-06-13 ENCOUNTER — Encounter: Payer: Self-pay | Admitting: Cardiovascular Disease

## 2018-06-26 NOTE — Addendum Note (Signed)
Addended by: Neta EhlersRUITT, Rehanna Oloughlin M on: 06/26/2018 04:47 PM   Modules accepted: Orders

## 2018-08-06 ENCOUNTER — Encounter: Payer: Self-pay | Admitting: Internal Medicine

## 2018-09-02 ENCOUNTER — Telehealth: Payer: Self-pay | Admitting: Cardiovascular Disease

## 2018-09-02 DIAGNOSIS — I771 Stricture of artery: Secondary | ICD-10-CM

## 2018-09-02 NOTE — Telephone Encounter (Signed)
New Message   Pt states that she normally has the arteries in her arms looked at the same time she gets her renal test and wants to know if it can be scheduled. Please call

## 2018-09-02 NOTE — Telephone Encounter (Signed)
yes

## 2018-09-03 NOTE — Telephone Encounter (Signed)
Spoke with pt, carotid dopplers scheduled. 

## 2018-09-08 DIAGNOSIS — Z6828 Body mass index (BMI) 28.0-28.9, adult: Secondary | ICD-10-CM | POA: Diagnosis not present

## 2018-09-08 DIAGNOSIS — E663 Overweight: Secondary | ICD-10-CM | POA: Diagnosis not present

## 2018-09-08 DIAGNOSIS — I10 Essential (primary) hypertension: Secondary | ICD-10-CM | POA: Diagnosis not present

## 2018-09-08 DIAGNOSIS — D692 Other nonthrombocytopenic purpura: Secondary | ICD-10-CM | POA: Diagnosis not present

## 2018-09-08 DIAGNOSIS — I4891 Unspecified atrial fibrillation: Secondary | ICD-10-CM | POA: Diagnosis not present

## 2018-09-08 DIAGNOSIS — E11319 Type 2 diabetes mellitus with unspecified diabetic retinopathy without macular edema: Secondary | ICD-10-CM | POA: Diagnosis not present

## 2018-09-08 DIAGNOSIS — I150 Renovascular hypertension: Secondary | ICD-10-CM | POA: Diagnosis not present

## 2018-09-08 DIAGNOSIS — R945 Abnormal results of liver function studies: Secondary | ICD-10-CM | POA: Diagnosis not present

## 2018-09-08 DIAGNOSIS — I7389 Other specified peripheral vascular diseases: Secondary | ICD-10-CM | POA: Diagnosis not present

## 2018-09-08 DIAGNOSIS — E7800 Pure hypercholesterolemia, unspecified: Secondary | ICD-10-CM | POA: Diagnosis not present

## 2018-09-08 DIAGNOSIS — E038 Other specified hypothyroidism: Secondary | ICD-10-CM | POA: Diagnosis not present

## 2018-10-06 ENCOUNTER — Inpatient Hospital Stay (HOSPITAL_COMMUNITY): Admission: RE | Admit: 2018-10-06 | Payer: PPO | Source: Ambulatory Visit

## 2018-10-07 ENCOUNTER — Ambulatory Visit (HOSPITAL_COMMUNITY)
Admission: RE | Admit: 2018-10-07 | Discharge: 2018-10-07 | Disposition: A | Discharge status: Home / Self Care | Payer: PPO | Source: Ambulatory Visit | Attending: Internal Medicine | Admitting: Internal Medicine

## 2018-10-07 ENCOUNTER — Ambulatory Visit (HOSPITAL_BASED_OUTPATIENT_CLINIC_OR_DEPARTMENT_OTHER)
Admission: RE | Admit: 2018-10-07 | Discharge: 2018-10-07 | Disposition: A | Discharge status: Home / Self Care | Payer: PPO | Source: Ambulatory Visit | Attending: Cardiovascular Disease | Admitting: Cardiovascular Disease

## 2018-10-07 DIAGNOSIS — I771 Stricture of artery: Secondary | ICD-10-CM | POA: Insufficient documentation

## 2018-10-07 DIAGNOSIS — I701 Atherosclerosis of renal artery: Secondary | ICD-10-CM | POA: Insufficient documentation

## 2018-11-10 ENCOUNTER — Institutional Professional Consult (permissible substitution): Payer: PPO | Admitting: Cardiovascular Disease

## 2018-11-24 ENCOUNTER — Encounter: Payer: Self-pay | Admitting: Cardiovascular Disease

## 2018-11-24 ENCOUNTER — Ambulatory Visit: Payer: PPO | Admitting: Cardiovascular Disease

## 2018-11-24 VITALS — BP 182/110 | HR 71 | Ht 62.0 in | Wt 157.8 lb

## 2018-11-24 DIAGNOSIS — E785 Hyperlipidemia, unspecified: Secondary | ICD-10-CM | POA: Diagnosis not present

## 2018-11-24 DIAGNOSIS — I480 Paroxysmal atrial fibrillation: Secondary | ICD-10-CM

## 2018-11-24 DIAGNOSIS — I701 Atherosclerosis of renal artery: Secondary | ICD-10-CM

## 2018-11-24 NOTE — Patient Instructions (Signed)
Medication Instructions:  Your physician recommends that you continue on your current medications as directed. Please refer to the Current Medication list given to you today.  If you need a refill on your cardiac medications before your next appointment, please call your pharmacy.   Lab work: none If you have labs (blood work) drawn today and your tests are completely normal, you will receive your results only by: Marland Kitchen. MyChart Message (if you have MyChart) OR . A paper copy in the mail If you have any lab test that is abnormal or we need to change your treatment, we will call you to review the results.  Testing/Procedures: none  Follow-Up: [facility], you and your health needs are our priority.  As part of our continuing mission to provide you with exceptional heart care, we have created designated Provider Care Teams.  These Care Teams include your primary Cardiologist (physician) and Advanced Practice Providers (APPs -  Physician Assistants and Nurse Practitioners) who all work together to provide you with the care you need, when you need it. You will need a follow up appointment in 1 months.  Please call our office 2 months in advance to schedule this appointment.  You may see Dr. Allyson SabalBerry - PV or one of the following Advanced Practice Providers on your designated Care Team:   Corine ShelterLuke Kilroy, PA-C Judy PimpleKrista Kroeger, [place]PA-C . Marjie Skiffallie Goodrich, PA-C  Any Other Special Instructions Will Be Listed Below (If Applicable). Dr. Kirke CorinArida would like you to check your blood pressure DAILY for the next 4 weeks.  Keep a journal of these daily BP and heart rate reading and call our office with the results.

## 2018-11-24 NOTE — Progress Notes (Signed)
Cardiology Office Note   Date:  11/24/2018   ID:  Gabriela Moyer, DOB 25-Oct-1941, MRN [id number]  PCP:  Gaspar Garbe, MD  Cardiologist:  Dr. Tresa Endo  Chief Complaint  Patient presents with  . Follow-up    pt denied chest pain      History of Present Illness: Gabriela Moyer is a 77 y.o. female who was referred by Dr. Tresa Endo for evaluation of renal artery stenosis.  She has known history of peripheral arterial disease involving subclavian, axillary, brachial vessels as well as renal arteries.  She is status post right renal artery stenting in July 2010 by Dr. Allyson Sabal.  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  Her right axillary artery was occluded with collaterals she was found then to have significant right renal artery stenosis.  She underwent stenting in 2010. She also has known history of paroxysmal atrial fibrillation maintained in sinus rhythm with flecainide.  She recently had renal artery duplex which showed significantly elevated velocity at the ostium of the right renal artery.  Her renal function has been normal as evidenced by her recent labs.  She denies any arm or leg claudication.  No chest pain, shortness of breath or palpitations.  She walks 10,000 steps daily with no limitations.  She reports that her blood pressure is well controlled at home.  It is elevated today but she reports being anxious about this appointment. She has no abdominal pain or weight loss.   Past Medical History:  Diagnosis Date  . Carotid arterial disease (HCC)    a. 08/2016 Carotid U/S: 1-39% bilat ICA stenosis, >50 R ECA stenosis.  . Diastolic dysfunction    a. 11/2016 Echo: EF 65-70%, Gr1 DD.  Marland Kitchen History of stress test    a. 12/2016 Lexiscan MV: EF 73%, no ischemia/infarct.  . Hyperlipidemia   . Hypertension   . PAF (paroxysmal atrial fibrillation) (HCC)    a. 11/2016 AF RVR in  ED-->converted on IV dilt-->CHA2DS2VASc = 4-->Xarelto.  . Renal artery stenosis (HCC)    a. 06/2009 s/p R Renal Artery PTA/stenting; b. 08/2016 Renal Duplex: nl LRA, >60% RRA stenosis- f/u 1 yr.  . Subclavian arterial stenosis (HCC)    a. 08/2014 Upper Ext Duplex: R distal [name] & Ax - 70-99% diam reduction, L distal [name] & Ax - 50-69%--stable.    Past Surgical History:  Procedure Laterality Date  . CARDIAC CATHETERIZATION  11/03/2007   atherosclerotic right renal artery (Dr. Evlyn Courier)  . Carotid Doppler  12/2010   R & L ICA with small amount fibrous plaque; R distal subclav. 70-99% diameter reduction; L mid subclav 50-69% diameter reduction  . Lower Extremity Arterial Doppler  03/2008   right ABI - mild arterial insuff at rest; R CIA with less than 50% diameter reduction, R & L SFA with less than 50% diameter reduction  . NM MYOCAR PERF WALL MOTION  09/2009   bruce myoview - normal perfusion, EF 915, low risk scan  . RENAL ANGIOPLASTY  11/07/2009   5x13mm Cordis Genesis Aviator stent to ostium of right renal artery (Dr. Erlene Quan)  . Renal Artery Doppler  04/2012   celiac artery with >50% diameter reduction; R renal artery stent w/mildly elevated velocities (1-59% diameter reduction)  . TRANSTHORACIC ECHOCARDIOGRAM  08/28/2009   EF=>55%; trace TR; mild AV regurg, mild pulm valve regurg  . Upper Extremity Arterial Doppler  04/2012  right brachial pressure 96mmHg, left 120mmHg; R distal subclavian & axillary arteries 70-99% diameter reduction; L distal subclavian/axillary & brachial arteries (50-69% diameter reduction)     Current Outpatient Medications  Medication Sig Dispense Refill  . clopidogrel (PLAVIX) 75 MG tablet TAKE 1 TABLET BY MOUTH DAILY 90 tablet 3  . diltiazem (CARTIA XT) 120 MG 24 hr capsule Take 1 capsule (120 mg total) by mouth daily. 90 capsule 3  . ezetimibe (ZETIA) 10 MG tablet Take 1 tablet (10 mg total) by mouth daily. 90 tablet 3  . flecainide (TAMBOCOR) 50 MG tablet Take 1  tablet (50 mg total) by mouth 2 (two) times daily as needed (AFIB AND INCREASED HR). 30 tablet 3  . hydrochlorothiazide (HYDRODIURIL) 25 MG tablet Take 1 tablet (25 mg total) by mouth daily. 90 tablet 3  . levothyroxine (SYNTHROID, LEVOTHROID) 25 MCG tablet Take 1 tablet by mouth daily.  0  . metoprolol succinate (TOPROL-XL) 100 MG 24 hr tablet TAKE 1 TABLET BY MOUTH EVERY DAY WITH OR IMMEDIATELY AFTER A MEAL 90 tablet 1  . pravastatin (PRAVACHOL) 20 MG tablet Take 1 tablet (20 mg total) by mouth every evening. 90 tablet 3   No current facility-administered medications for this visit.     Allergies:   Codeine; Contrast media [iodinated diagnostic agents]; and Morphine    Social History:  The patient  reports that she is a non-smoker but has been exposed to tobacco smoke. She has never used smokeless tobacco. She reports that she does not drink alcohol.   Family History:  The patient's family history includes Cancer in her brother and father; Heart Problems in her maternal grandfather and maternal grandmother; Heart disease in her mother; Stroke in her mother.    ROS:  Please see the history of present illness.   Otherwise, review of systems are positive for none.   All other systems are reviewed and negative.    PHYSICAL EXAM: VS:  BP (!) 182/110   Pulse 71   Ht 5\' 2"  (1.575 m)   Wt 157 lb 12.8 oz (71.6 kg)   SpO2 97%   BMI 28.86 kg/m  , BMI Body mass index is 28.86 kg/m. GEN: Well nourished, well developed, in no acute distress  HEENT: normal  Neck: no JVD, carotid bruits, or masses.  There is a bruit in the right upper chest area. Cardiac: RRR; no murmurs, rubs, or gallops,no edema  Respiratory:  clear to auscultation bilaterally, normal work of breathing GI: soft, nontender, nondistended, + BS MS: no deformity or atrophy  Skin: warm and dry, no rash Neuro:  Strength and sensation are intact Psych: euthymic mood, full affect Radial pulses +1 bilaterally.  Dorsalis pedis is  +2.  EKG:  EKG is not ordered today.   Recent Labs: 02/27/2018: Hemoglobin 13.7; Platelets 271 04/14/2018: ALT 26; BUN 19; Creatinine, Ser 0.84; Potassium 5.2; Sodium 144    Lipid Panel    Component Value Date/Time   CHOL 191 04/14/2018 0903   TRIG 98 04/14/2018 0903   HDL 68 04/14/2018 0903   CHOLHDL 2.8 04/14/2018 0903   CHOLHDL 2.7 12/19/2016 0418   VLDL 16 12/19/2016 0418   LDLCALC 103 (H) 04/14/2018 0903      Wt Readings from Last 3 Encounters:  11/24/18 157 lb 12.8 oz (71.6 kg)  06/12/18 151 lb 9.6 oz (68.8 kg)  03/03/18 152 lb 3.2 oz (69 kg)       No flowsheet data found.    ASSESSMENT AND PLAN:  1.  Renal artery stenosis status post right renal artery stenting in 2010: Recent duplex showed progression of elevated velocity consistent with greater than 60% stenosis.  I explained to her the indications of renal artery revascularization.  She currently has no heart failure symptoms and no chronic kidney disease.  The only indication left would be uncontrolled hypertension in spite of 3 different antihypertensive medications including a thiazide diuretic which she is already on.  I reviewed her recent blood pressure readings and all of them were in the normal range.  She also reports normal readings at home.  I asked her to monitor her blood pressure daily over the next month and then I am going to schedule her to see Dr. Allyson Sabal after that given that he performed her initial stent.  If blood pressure is uncontrolled, angiography and stenting can be considered at that time but I suspect that she can likely be treated medically.  2.  Subclavian and axillary artery disease: Her radial pulses mildly diminished bilaterally and she has no symptoms at the present time.  3.  Paroxysmal atrial fibrillation: Maintaining in sinus rhythm with flecainide.  Consider long-term anticoagulation given chads vas score of 4.  4.  Hyperlipidemia: Currently on pravastatin.    Disposition:    FU with Dr. Allyson Sabal in 1 month  Signed,  Lorine Bears, MD  11/24/2018 5:31 PM    [facility]

## 2018-11-29 ENCOUNTER — Other Ambulatory Visit: Payer: Self-pay | Admitting: Cardiovascular Disease

## 2019-01-01 ENCOUNTER — Other Ambulatory Visit: Payer: Self-pay | Admitting: Cardiovascular Disease

## 2019-01-04 DIAGNOSIS — M8580 Other specified disorders of bone density and structure, unspecified site: Secondary | ICD-10-CM | POA: Diagnosis not present

## 2019-01-04 DIAGNOSIS — E7800 Pure hypercholesterolemia, unspecified: Secondary | ICD-10-CM | POA: Diagnosis not present

## 2019-01-04 DIAGNOSIS — E038 Other specified hypothyroidism: Secondary | ICD-10-CM | POA: Diagnosis not present

## 2019-01-04 DIAGNOSIS — I10 Essential (primary) hypertension: Secondary | ICD-10-CM | POA: Diagnosis not present

## 2019-01-04 DIAGNOSIS — M859 Disorder of bone density and structure, unspecified: Secondary | ICD-10-CM | POA: Diagnosis not present

## 2019-01-04 DIAGNOSIS — R7302 Impaired glucose tolerance (oral): Secondary | ICD-10-CM | POA: Diagnosis not present

## 2019-01-04 DIAGNOSIS — R82998 Other abnormal findings in urine: Secondary | ICD-10-CM | POA: Diagnosis not present

## 2019-01-04 DIAGNOSIS — E039 Hypothyroidism, unspecified: Secondary | ICD-10-CM | POA: Diagnosis not present

## 2019-01-05 ENCOUNTER — Encounter: Payer: Self-pay | Admitting: Cardiovascular Disease

## 2019-01-05 ENCOUNTER — Ambulatory Visit: Payer: PPO | Admitting: Cardiovascular Disease

## 2019-01-05 VITALS — BP 154/90 | HR 71 | Ht 62.0 in | Wt 155.0 lb

## 2019-01-05 DIAGNOSIS — I701 Atherosclerosis of renal artery: Secondary | ICD-10-CM | POA: Diagnosis not present

## 2019-01-05 DIAGNOSIS — I10 Essential (primary) hypertension: Secondary | ICD-10-CM | POA: Diagnosis not present

## 2019-01-05 NOTE — Patient Instructions (Signed)
Medication Instructions:  NO CHANGES If you need a refill on your cardiac medications before your next appointment, please call your pharmacy.   Lab work: NONE If you have labs (blood work) drawn today and your tests are completely normal, you will receive your results only by: Marland Kitchen MyChart Message (if you have MyChart) OR . A paper copy in the mail If you have any lab test that is abnormal or we need to change your treatment, we will call you to review the results.  Testing/Procedures: Your physician has requested that you have a renal artery duplex. During this test, an ultrasound is used to evaluate blood flow to the kidneys. Allow one hour for this exam. Do not eat after midnight the day before and avoid carbonated beverages. Take your medications as you usually do.  OCTOBER 2020  Follow-Up: At [facility], you and your health needs are our priority.  As part of our continuing mission to provide you with exceptional heart care, we have created designated Provider Care Teams.  These Care Teams include your primary Cardiologist (physician) and Advanced Practice Providers (APPs -  Physician Assistants and Nurse Practitioners) who all work together to provide you with the care you need, when you need it. . You will need a follow up appointment in 12 months.  Please call our office 2 months in advance to schedule this appointment.  You may see Dr. Allyson Sabal or one of the following Advanced Practice Providers on your designated Care Team:   . Corine Shelter, [place] . Azalee Course, PA-C . Micah Flesher, PA-C . Joni Reining, DNP . Theodore Demark, PA-C . Judy Pimple, PA-C . Marjie Skiff, PA-C

## 2019-01-05 NOTE — Progress Notes (Signed)
01/05/2019 Gabriela Moyer   [birth year]  [id number]  Primary Physician [name], Adelfa Koh, MD Primary Cardiologist: Runell Gess MD Nicholes Calamity, [place]  HPI:  Gabriela Moyer is a 78 y.o. appearing Caucasian female patient of Dr. Landry Dyke who I last saw April 2014 in the office.  She does have a history of hyperlipidemia and hypertension as well as PAD.  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  I performed right renal artery stenting 07/18/2009.  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  She is on 3 antihypertensive medications but her blood pressures at home are well controlled.  Renal function is normal.  She saw Dr. Kirke Corin in the office recently who evaluated her and felt that she was not a candidate for re-intervention given her lack of indications.   Current Meds  Medication Sig  . CARTIA XT 120 MG 24 hr capsule TAKE 1 CAPSULE(120 MG) BY MOUTH DAILY  . clopidogrel (PLAVIX) 75 MG tablet TAKE 1 TABLET BY MOUTH DAILY  . ezetimibe (ZETIA) 10 MG tablet Take 1 tablet (10 mg total) by mouth daily.  . flecainide (TAMBOCOR) 50 MG tablet Take 1 tablet (50 mg total) by mouth 2 (two) times daily as needed (AFIB AND INCREASED HR).  . hydrochlorothiazide (HYDRODIURIL) 25 MG tablet TAKE 1 TABLET(25 MG) BY MOUTH DAILY  . levothyroxine (SYNTHROID, LEVOTHROID) 25 MCG tablet Take 1 tablet by mouth daily.  . metoprolol succinate (TOPROL-XL) 100 MG 24 hr tablet TAKE 1 TABLET BY MOUTH EVERY DAY WITH OR IMMEDIATELY AFTER A MEAL     Allergies  Allergen Reactions  . Codeine Nausea And Vomiting    Abdominal pain  . Contrast Media [Iodinated Diagnostic Agents] Nausea And Vomiting    Abdominal pain Caused low blood pressure  . Morphine Nausea And Vomiting    Abdominal pain    Social History   Socioeconomic History  . Marital  status: Divorced    Spouse name: Not on file  . Number of children: 2  . Years of education: 57  . Highest education level: Not on file  Occupational History  . Not on file  Social Needs  . Financial resource strain: Not on file  . Food insecurity:    Worry: Not on file    Inability: Not on file  . Transportation needs:    Medical: Not on file    Non-medical: Not on file  Tobacco Use  . Smoking status: Passive Smoke Exposure - Never Smoker  . Smokeless tobacco: Never Used  Substance and Sexual Activity  . Alcohol use: No  . Drug use: Not on file  . Sexual activity: Not on file  Lifestyle  . Physical activity:    Days per week: Not on file    Minutes per session: Not on file  . Stress: Not on file  Relationships  . Social connections:    Talks on phone: Not on file    Gets together: Not on file    Attends religious service: Not on file    Active member of club or organization: Not on file    Attends meetings of clubs or organizations: Not on file    Relationship status: Not on file  . Intimate partner violence:    Fear of current or ex partner: Not on file    Emotionally abused: Not on  file    Physically abused: Not on file    Forced sexual activity: Not on file  Other Topics Concern  . Not on file  Social History Narrative  . Not on file     Review of Systems: General: negative for chills, fever, night sweats or weight changes.  Cardiovascular: negative for chest pain, dyspnea on exertion, edema, orthopnea, palpitations, paroxysmal nocturnal dyspnea or shortness of breath Dermatological: negative for rash Respiratory: negative for cough or wheezing Urologic: negative for hematuria Abdominal: negative for nausea, vomiting, diarrhea, bright red blood per rectum, melena, or hematemesis Neurologic: negative for visual changes, syncope, or dizziness All other systems reviewed and are otherwise negative except as noted above.    Blood pressure (!) 154/90, pulse 71,  height 5\' 2"  (1.575 m), weight 155 lb (70.3 kg).  General appearance: alert and no distress Neck: no adenopathy, no carotid bruit, no JVD, supple, symmetrical, trachea midline and thyroid not enlarged, symmetric, no tenderness/mass/nodules Lungs: clear to auscultation bilaterally Heart: regular rate and rhythm, S1, S2 normal, no murmur, click, rub or gallop Extremities: extremities normal, atraumatic, no cyanosis or edema Pulses: 2+ and symmetric Skin: Skin color, texture, turgor normal. No rashes or lesions Neurologic: Alert and oriented X 3, normal strength and tone. Normal symmetric reflexes. Normal coordination and gait  EKG sinus rhythm at 71 with septal Q waves.  I personally reviewed this EKG.  ASSESSMENT AND PLAN:   RENAL ARTERY STENOSIS History of right renal artery stenosis status post stenting by myself in 2010.  She is on 3 antihypertensive medications including Cartia, hydrochlorothiazide and metoprolol blood pressures at home measured by herself in the 1 20-1 40 range over 70s.  Recent renal Doppler studies performed 10/09/2018 revealed a high-frequency signal (564 cm/s) at the origin of the right renal artery with a renal aortic ratio of 4.2 and a right renal dimension of 9.2 cm, 1 cm smaller than the left kidney.  Renal function is normal by lab work.  This point, there is no indication for re-intervention.  Should her blood pressures become more difficult to control on her current medications I could certainly go back in and re-intervene on the right renal artery.      Runell Gess MD FACP,FACC,FAHA, [facility] 01/05/2019 10:44 AM

## 2019-01-05 NOTE — Assessment & Plan Note (Signed)
History of right renal artery stenosis status post stenting by myself in 2010.  She is on 3 antihypertensive medications including Cartia, hydrochlorothiazide and metoprolol blood pressures at home measured by herself in the 1 20-1 40 range over 70s.  Recent renal Doppler studies performed 10/09/2018 revealed a high-frequency signal (564 cm/s) at the origin of the right renal artery with a renal aortic ratio of 4.2 and a right renal dimension of 9.2 cm, 1 cm smaller than the left kidney.  Renal function is normal by lab work.  This point, there is no indication for re-intervention.  Should her blood pressures become more difficult to control on her current medications I could certainly go back in and re-intervene on the right renal artery.

## 2019-01-11 DIAGNOSIS — E7800 Pure hypercholesterolemia, unspecified: Secondary | ICD-10-CM | POA: Diagnosis not present

## 2019-01-11 DIAGNOSIS — Z1389 Encounter for screening for other disorder: Secondary | ICD-10-CM | POA: Diagnosis not present

## 2019-01-11 DIAGNOSIS — Z0000 Encounter for general adult medical examination without abnormal findings: Secondary | ICD-10-CM | POA: Diagnosis not present

## 2019-01-11 DIAGNOSIS — E11319 Type 2 diabetes mellitus with unspecified diabetic retinopathy without macular edema: Secondary | ICD-10-CM | POA: Diagnosis not present

## 2019-01-11 DIAGNOSIS — I701 Atherosclerosis of renal artery: Secondary | ICD-10-CM | POA: Diagnosis not present

## 2019-01-11 DIAGNOSIS — I4891 Unspecified atrial fibrillation: Secondary | ICD-10-CM | POA: Diagnosis not present

## 2019-01-11 DIAGNOSIS — I10 Essential (primary) hypertension: Secondary | ICD-10-CM | POA: Diagnosis not present

## 2019-01-11 DIAGNOSIS — E663 Overweight: Secondary | ICD-10-CM | POA: Diagnosis not present

## 2019-01-11 DIAGNOSIS — I7389 Other specified peripheral vascular diseases: Secondary | ICD-10-CM | POA: Diagnosis not present

## 2019-01-11 DIAGNOSIS — I150 Renovascular hypertension: Secondary | ICD-10-CM | POA: Diagnosis not present

## 2019-01-11 DIAGNOSIS — Z6829 Body mass index (BMI) 29.0-29.9, adult: Secondary | ICD-10-CM | POA: Diagnosis not present

## 2019-01-11 DIAGNOSIS — E038 Other specified hypothyroidism: Secondary | ICD-10-CM | POA: Diagnosis not present

## 2019-01-12 DIAGNOSIS — Z1212 Encounter for screening for malignant neoplasm of rectum: Secondary | ICD-10-CM | POA: Diagnosis not present

## 2019-03-25 ENCOUNTER — Other Ambulatory Visit: Payer: Self-pay | Admitting: Cardiovascular Disease

## 2019-03-25 MED ORDER — PRAVASTATIN SODIUM 20 MG PO TABS: 20.0000 mg | ORAL_TABLET | Freq: Every evening | ORAL | 1 refills | Status: DC

## 2019-04-01 ENCOUNTER — Other Ambulatory Visit: Payer: Self-pay | Admitting: Cardiovascular Disease

## 2019-05-07 ENCOUNTER — Other Ambulatory Visit: Payer: Self-pay | Admitting: Cardiovascular Disease

## 2019-05-07 NOTE — Telephone Encounter (Signed)
Clopidogrel refilled

## 2019-05-25 ENCOUNTER — Telehealth: Payer: Self-pay | Admitting: Physician Assistant

## 2019-05-25 NOTE — Telephone Encounter (Signed)
Mychart sent via email, no smartphone, consent (verbal), pre reg complete 05/25/19 AF

## 2019-05-26 ENCOUNTER — Telehealth (INDEPENDENT_AMBULATORY_CARE_PROVIDER_SITE_OTHER): Payer: PPO | Admitting: Physician Assistant

## 2019-05-26 VITALS — BP 126/75 | HR 75 | Ht 62.0 in | Wt 160.0 lb

## 2019-05-26 DIAGNOSIS — E039 Hypothyroidism, unspecified: Secondary | ICD-10-CM

## 2019-05-26 DIAGNOSIS — Z01812 Encounter for preprocedural laboratory examination: Secondary | ICD-10-CM

## 2019-05-26 DIAGNOSIS — I739 Peripheral vascular disease, unspecified: Secondary | ICD-10-CM

## 2019-05-26 DIAGNOSIS — E785 Hyperlipidemia, unspecified: Secondary | ICD-10-CM

## 2019-05-26 DIAGNOSIS — R079 Chest pain, unspecified: Secondary | ICD-10-CM

## 2019-05-26 DIAGNOSIS — I10 Essential (primary) hypertension: Secondary | ICD-10-CM

## 2019-05-26 DIAGNOSIS — I480 Paroxysmal atrial fibrillation: Secondary | ICD-10-CM

## 2019-05-26 MED ORDER — METOPROLOL TARTRATE 50 MG PO TABS: ORAL_TABLET | ORAL | 0 refills | Status: DC

## 2019-05-26 NOTE — Patient Instructions (Addendum)
Medication Instructions:   Your physician recommends that you continue on your current medications as directed. Please refer to the Current Medication list given to you today.  If you need a refill on your cardiac medications before your next appointment, please call your pharmacy.   Lab work: You will need to have labs (blood work) drawn prior to your Coronary CT:  BMET  If you have labs (blood work) drawn today and your tests are completely normal, you will receive your results only by: Marland Kitchen MyChart Message (if you have MyChart) OR . A paper copy in the mail If you have any lab test that is abnormal or we need to change your treatment, we will call you to review the results.  Testing/Procedures:  Your physician has requested that you have cardiac CT. Cardiac computed tomography (CT) is a painless test that uses an x-ray machine to take clear, detailed pictures of your heart. For further information please visit [email or web address] Please follow instruction sheet as given.     Follow-Up: At [facility], you and your health needs are our priority.  As part of our continuing mission to provide you with exceptional heart care, we have created designated Provider Care Teams.  These Care Teams include your primary Cardiologist (physician) and Advanced Practice Providers (APPs -  Physician Assistants and Nurse Practitioners) who all work together to provide you with the care you need, when you need it. . Your physician recommends that you schedule a follow-up appointment in 3 months with Lennette Bihari, MD   Any Other Special Instructions Will Be Listed Below (If Applicable).      Please arrive at the [facility] main entrance of [facility] at xx:xx AM (30-45 minutes prior to test start time)  [facility] [address] [place], [place] [address] [phone]  Proceed to the [facility] Radiology Department (First Floor).  Please follow these instructions  carefully (unless otherwise directed):  Hold all erectile dysfunction medications at least 48 hours prior to test.  On the Night Before the Test: . Be sure to Drink plenty of water. . Do not consume any caffeinated/decaffeinated beverages or chocolate 12 hours prior to your test. . Do not take any antihistamines 12 hours prior to your test. . If you take Metformin do not take 24 hours prior to test. . If the patient has contrast allergy: ? Patient will need a prescription for Prednisone and very clear instructions (as follows): 1. Prednisone 50 mg - take 13 hours prior to test 2. Take another Prednisone 50 mg 7 hours prior to test 3. Take another Prednisone 50 mg 1 hour prior to test 4. Take Benadryl 50 mg 1 hour prior to test . Patient must complete all four doses of above prophylactic medications. . Patient will need a ride after test due to Benadryl.  On the Day of the Test: . Drink plenty of water. Do not drink any water within one hour of the test. . Do not eat any food 4 hours prior to the test. . You may take your regular medications prior to the test.  . Take metoprolol (Lopressor) two hours prior to test. . HOLD Furosemide/Hydrochlorothiazide morning of the test.   *For Clinical Staff only. Please instruct patient the following:*        -Drink plenty of water       -Hold Furosemide/hydrochlorothiazide morning of the test       -Take metoprolol (Lopressor) 2 hours prior to test (  if applicable).                  -If HR is less than 55 BPM- No Beta Blocker                -IF HR is greater than 55 BPM and patient is less than or equal to 79 yrs old Lopressor 100mg  x1.                -If HR is greater than 55 BPM and patient is greater than 79 yrs old Lopressor 50 mg x1.     Do not give Lopressor to patients with an allergy to lopressor or anyone with asthma or active COPD symptoms (currently taking steroids).       After the Test: . Drink plenty of water. . After  receiving IV contrast, you may experience a mild flushed feeling. This is normal. . On occasion, you may experience a mild rash up to 24 hours after the test. This is not dangerous. If this occurs, you can take Benadryl 25 mg and increase your fluid intake. . If you experience trouble breathing, this can be serious. If it is severe call 911 IMMEDIATELY. If it is mild, please call our office. . If you take any of these medications: Glipizide/Metformin, Avandament, Glucavance, please do not take 48 hours after completing test.

## 2019-05-26 NOTE — Progress Notes (Signed)
Virtual Visit via Telephone Note   This visit type was conducted due to national recommendations for restrictions regarding the COVID-19 Pandemic (e.g. social distancing) in an effort to limit this patient's exposure and mitigate transmission in our community.  Due to her co-morbid illnesses, this patient is at least at moderate risk for complications without adequate follow up.  This format is felt to be most appropriate for this patient at this time.  The patient did not have access to video technology/had technical difficulties with video requiring transitioning to audio format only (telephone).  All issues noted in this document were discussed and addressed.  No physical exam could be performed with this format.  Please refer to the patient's chart for her  consent to telehealth [facility].   Date:  05/26/2019   ID:  Gabriela Moyer, DOB 01-13-1941, MRN [id number]  Patient Location: Home Provider Location: Home  PCP:  [name], Adelfa Kohichard W, MD  Cardiologist:  Nicki Guadalajarahomas Kelly, MD  Electrophysiologist:  None  PV specialist: Dr. Allyson SabalBerry  Evaluation Performed:  Follow-Up Visit  Chief Complaint:  Chest pain  History of Present Illness:    Gabriela Moyer is a 78 y.o. female with past medical history of PAD involving subclavian, axillary, brachial and renal arteries, hypothyroidism, hypertension, hyperlipidemia, and PAF.  Patient has carotid artery stenosis, renal artery stenosis and subclavian artery stenosis.  She underwent right renal artery stenting in July 2010.  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  Doppler study performed in September 2014 showed bilateral brachial pressure consistent with right upper extremity inflow disease.  She has both left and right subclavian and axillary artery stenosis based on previous Doppler.  She was admitted in December 2017 with atrial fibrillation with RVR.  She converted to sinus rhythm on diltiazem  drip and was discharged on Xarelto and diltiazem.  Xarelto was later self discontinued due to bloody stool.  She uses pill in the pocket strategy with flecainide.  Myoview in January 2018 was normal with EF 73%.  Doppler in October 2018 also showed 70 to 99% stenosis in the SMA.  Most recent work-up include carotid Doppler obtained in October 2019 which showed mild disease bilaterally with normal flow in bilateral subclavian artery.  Renal artery duplex obtained on the same day showed greater than 60% stenosis in the right renal artery consistent with in-stent restenosis, 1 to 59% stenosis in the left renal artery, 70 to 99% stenosis in SMA.  Since the study, she has been evaluated by both Dr. Kirke CorinArida and Dr. Gery PrayBarry who felt she is not a candidate for intervention given lack of symptom.  Patient was contacted today via telephone visit for evaluation of chest pain.  She says this is different from her usual atrial fibrillation symptoms.  She describes sharp chest pain that was intermittent however lasted for majority of the day on Monday.  She did not seek medical attention as each episode of chest pain only lasted a few seconds at a time.  She has never experienced this symptom.  Symptom is not exacerbated by deep inspiration, body rotation or palpation.  She did mention she was bit by a deer tick roughly 3 weeks ago however she denies any rash on her body.  I advised her to discuss this with her PCP.  As for her chest pain, the characteristic of the chest pain is somewhat atypical however she has significant risk factor with multiple peripheral arterial  disease in the past.  I recommended a coronary CT for further evaluation.  She will need a additional dose of metoprolol tartrate on top of her Toprol-XL as her heart rate remained in the 70s on the current Toprol-XL.  The patient does not have symptoms concerning for COVID-19 infection (fever, chills, cough, or new shortness of breath).    Past Medical  History:  Diagnosis Date   Carotid arterial disease (HCC)    a. 08/2016 Carotid U/S: 1-39% bilat ICA stenosis, >50 R ECA stenosis.   Diastolic dysfunction    a. 11/2016 Echo: EF 65-70%, Gr1 DD.   History of stress test    a. 12/2016 Lexiscan MV: EF 73%, no ischemia/infarct.   Hyperlipidemia    Hypertension    PAF (paroxysmal atrial fibrillation) (HCC)    a. 11/2016 AF RVR in ED-->converted on IV dilt-->CHA2DS2VASc = 4-->Xarelto.   Renal artery stenosis (HCC)    a. 06/2009 s/p R Renal Artery PTA/stenting; b. 08/2016 Renal Duplex: nl LRA, >60% RRA stenosis- f/u 1 yr.   Subclavian arterial stenosis (HCC)    a. 08/2014 Upper Ext Duplex: R distal [place] & Ax - 70-99% diam reduction, L distal [place] & Ax - 50-69%--stable.   Past Surgical History:  Procedure Laterality Date   CARDIAC CATHETERIZATION  11/03/2007   atherosclerotic right renal artery (Dr. Evlyn Courier)   Carotid Doppler  12/2010   R & L ICA with small amount fibrous plaque; R distal subclav. 70-99% diameter reduction; L mid subclav 50-69% diameter reduction   Lower Extremity Arterial Doppler  03/2008   right ABI - mild arterial insuff at rest; R CIA with less than 50% diameter reduction, R & L SFA with less than 50% diameter reduction   NM MYOCAR PERF WALL MOTION  09/2009   bruce myoview - normal perfusion, EF 915, low risk scan   RENAL ANGIOPLASTY  11/07/2009   5x34mm Cordis [employer] Aviator stent to ostium of right renal artery (Dr. Erlene Quan)   Renal Artery Doppler  04/2012   celiac artery with >50% diameter reduction; R renal artery stent w/mildly elevated velocities (1-59% diameter reduction)   TRANSTHORACIC ECHOCARDIOGRAM  08/28/2009   EF=>55%; trace TR; mild AV regurg, mild pulm valve regurg   Upper Extremity Arterial Doppler  04/2012   right brachial pressure , left ; R distal subclavian & axillary arteries 70-99% diameter reduction; L distal subclavian/axillary & brachial arteries (50-69% diameter reduction)      Current Meds  Medication Sig   clopidogrel (PLAVIX) 75 MG tablet TAKE 1 TABLET BY MOUTH DAILY   diltiazem (CARDIZEM CD) 120 MG 24 hr capsule TAKE 1 CAPSULE(120 MG) BY MOUTH DAILY   ezetimibe (ZETIA) 10 MG tablet Take 1 tablet (10 mg total) by mouth daily.   flecainide (TAMBOCOR) 50 MG tablet Take 1 tablet (50 mg total) by mouth 2 (two) times daily as needed (AFIB AND INCREASED HR).   hydrochlorothiazide (HYDRODIURIL) 25 MG tablet TAKE 1 TABLET(25 MG) BY MOUTH DAILY   levothyroxine (SYNTHROID, LEVOTHROID) 25 MCG tablet Take 1 tablet by mouth daily.   metoprolol succinate (TOPROL-XL) 100 MG 24 hr tablet TAKE 1 TABLET BY MOUTH EVERY DAY WITH OR IMMEDIATELY AFTER A MEAL   pravastatin (PRAVACHOL) 20 MG tablet Take 1 tablet (20 mg total) by mouth every evening.     Allergies:   Codeine; Contrast media [iodinated diagnostic agents]; and Morphine   Social History   Tobacco Use   Smoking status: Passive Smoke Exposure - Never Smoker  Smokeless tobacco: Never Used  Substance Use Topics   Alcohol use: No   Drug use: Not on file     Family Hx: The patient's family history includes Cancer in her brother and father; Heart Problems in her maternal grandfather and maternal grandmother; Heart disease in her mother; Stroke in her mother.  ROS:   Please see the history of present illness.     All other systems reviewed and are negative.   Prior CV studies:   The following studies were reviewed today:  Echo 12/18/2016 LV EF: 65% -   70% Study Conclusions  - Left ventricle: The cavity size was normal. There was mild focal   basal hypertrophy of the septum. Systolic function was vigorous.   The estimated ejection fraction was in the range of 65% to 70%.   Wall motion was normal; there were no regional wall motion   abnormalities. Doppler parameters are consistent with abnormal   left ventricular relaxation (grade 1 diastolic dysfunction).   There was no evidence of elevated  ventricular filling pressure by   Doppler parameters. - Aortic valve: Trileaflet; normal thickness leaflets. There was no   regurgitation. - Aortic root: The aortic root was normal in size. - Mitral valve: Structurally normal valve. There was no   regurgitation. - Left atrium: The atrium was normal in size. - Right ventricle: Systolic function was normal. - Right atrium: The atrium was normal in size. - Tricuspid valve: There was no regurgitation. - Pulmonic valve: There was no regurgitation. - Inferior vena cava: The vessel was normal in size. - Pericardium, extracardiac: There was no pericardial effusion.  Myoview 01/10/2017 Study Highlights     The left ventricular ejection fraction is hyperdynamic (>65%).  Nuclear stress EF: 73%.  The study is normal.  This is a low risk study.  There was no ST segment deviation noted during stress.   Normal resting and stress perfusion. No ischemia or infarction EF 73%     Labs/Other Tests and Data Reviewed:    EKG:  An ECG dated 06/12/2018 was personally reviewed today and demonstrated:  Normal sinus rhythm with inferior infarct.  Recent Labs: No results found for requested labs within last 8760 hours.   Recent Lipid Panel Lab Results  Component Value Date/Time   CHOL 191 04/14/2018 09:03 AM   TRIG 98 04/14/2018 09:03 AM   HDL 68 04/14/2018 09:03 AM   CHOLHDL 2.8 04/14/2018 09:03 AM   CHOLHDL 2.7 12/19/2016 04:18 AM   LDLCALC 103 (H) 04/14/2018 09:03 AM    Wt Readings from Last 3 Encounters:  05/26/19 160 lb (72.6 kg)  01/05/19 155 lb (70.3 kg)  11/24/18 157 lb 12.8 oz (71.6 kg)     Objective:    Vital Signs:  BP 126/75    Pulse 75    Ht  (1.575 m)    Wt 160 lb (72.6 kg)    BMI 29.26 kg/m    VITAL SIGNS:  reviewed  ASSESSMENT & PLAN:    1. Chest pain: Her chest pain is somewhat atypical however lasted for majority of the day on Monday.  She described as a intermittent chest pain that lasts only a few  seconds at a time however keep coming back.  She has significant history of peripheral arterial disease.  I recommend a coronary CT to make sure her recent symptom is noncardiac.  She will need an additional dose of metoprolol on top of her usual Toprol-XL to help slow down the  heart rate prior to the study.  2. Peripheral arterial disease: Patient has diffuse peripheral arterial disease involving subclavian, brachial and renal arteries.  On Plavix  3. Hypertension: Continue on current therapy.  Blood pressure well controlled  4. Hyperlipidemia: On Pravachol.  5. PAF: Continue diltiazem and flecainide.  Previously on Xarelto, however discontinued due to to GI bleed.  6. Hypothyroidism: Managed by primary care provider   COVID-19 Education: The signs and symptoms of COVID-19 were discussed with the patient and how to seek care for testing (follow up with PCP or arrange E-visit).  The importance of social distancing was discussed today.  Time:   Today, I have spent 20 minutes with the patient with telehealth technology discussing the above problems.     Medication Adjustments/Labs and Tests Ordered: Current medicines are reviewed at length with the patient today.  Concerns regarding medicines are outlined above.   Tests Ordered: Orders Placed This Encounter  Procedures   CT CORONARY MORPH W/CTA COR W/SCORE W/CA W/CM &/OR WO/CM   CT CORONARY FRACTIONAL FLOW RESERVE DATA PREP   CT CORONARY FRACTIONAL FLOW RESERVE FLUID ANALYSIS   Basic metabolic panel    Medication Changes: Meds ordered this encounter  Medications   metoprolol tartrate (LOPRESSOR) 50 MG tablet    Sig: Take 2 hours prior to procedure    Dispense:  1 tablet    Refill:  0    Disposition:  Follow up in 3 month(s)  Signed, Azalee Course, PA  05/26/2019 5:50 PM    [facility]

## 2019-06-21 DIAGNOSIS — Z01812 Encounter for preprocedural laboratory examination: Secondary | ICD-10-CM | POA: Diagnosis not present

## 2019-06-21 LAB — BASIC METABOLIC PANEL
BUN/Creatinine Ratio: 18 (ref 12–28)
BUN: 16 mg/dL (ref 8–27)
CO2: 26 mmol/L (ref 20–29)
Calcium: 10.3 mg/dL (ref 8.7–10.3)
Chloride: 100 mmol/L (ref 96–106)
Creatinine, Ser: 0.89 mg/dL (ref 0.57–1.00)
GFR calc Af Amer: 71 mL/min/{1.73_m2} (ref >59)
GFR calc non Af Amer: 62 mL/min/{1.73_m2} (ref >59)
Glucose: 102 mg/dL — ABNORMAL HIGH (ref 65–99)
Potassium: 4.8 mmol/L (ref 3.5–5.2)
Sodium: 142 mmol/L (ref 134–144)

## 2019-06-22 NOTE — Progress Notes (Signed)
The patient has been notified of the result and verbalized understanding.  All questions (if any) were answered. Jacqulynn Cadet, [place] 06/22/2019 9:24 AM

## 2019-06-25 ENCOUNTER — Other Ambulatory Visit: Payer: Self-pay | Admitting: Physician Assistant

## 2019-06-25 ENCOUNTER — Telehealth (HOSPITAL_COMMUNITY): Payer: Self-pay | Admitting: Emergency Medicine

## 2019-06-25 MED ORDER — PREDNISONE 50 MG PO TABS: 50.0000 mg | ORAL_TABLET | Freq: Four times a day (QID) | ORAL | 0 refills | Status: DC

## 2019-06-25 NOTE — Telephone Encounter (Signed)
Left message on voicemail with name and callback number Jozelyn Kuwahara RN Navigator Cardiac Imaging [facility] [phone] Office [phone] Cell  

## 2019-06-25 NOTE — Telephone Encounter (Signed)
Pt returned phone call regarding upcoming cardiac imaging study; pt verbalizes understanding of appt date/time, parking situation and where to check in, pre-test NPO status and medications ordered, and verified current allergies; name and call back number provided for further questions should they arise Marchia Bond RN [facility] [phone] office [phone] cell  Pt denies covid symptoms, verbalized understanding of visitor policy however will need ride home d/t prednisone/benadryl prep

## 2019-06-26 ENCOUNTER — Other Ambulatory Visit: Payer: Self-pay | Admitting: Cardiovascular Disease

## 2019-06-28 ENCOUNTER — Other Ambulatory Visit: Payer: Self-pay

## 2019-06-28 ENCOUNTER — Ambulatory Visit (HOSPITAL_COMMUNITY)
Admission: RE | Admit: 2019-06-28 | Discharge: 2019-06-28 | Disposition: A | Discharge status: Home / Self Care | Payer: PPO | Source: Ambulatory Visit | Attending: Physician Assistant | Admitting: Physician Assistant

## 2019-06-28 ENCOUNTER — Encounter (HOSPITAL_COMMUNITY): Payer: Self-pay

## 2019-06-28 DIAGNOSIS — R079 Chest pain, unspecified: Secondary | ICD-10-CM

## 2019-06-28 DIAGNOSIS — I2510 Atherosclerotic heart disease of native coronary artery without angina pectoris: Secondary | ICD-10-CM | POA: Insufficient documentation

## 2019-06-28 DIAGNOSIS — I700 Atherosclerosis of aorta: Secondary | ICD-10-CM | POA: Diagnosis not present

## 2019-06-28 MED ORDER — NITROGLYCERIN 0.4 MG SL SUBL
0.8000 mg | SUBLINGUAL_TABLET | Freq: Once | SUBLINGUAL | Status: AC
  Administered 2019-06-28: 16:00:00 0.8 mg via SUBLINGUAL

## 2019-06-28 MED ORDER — NITROGLYCERIN 0.4 MG SL SUBL
SUBLINGUAL_TABLET | SUBLINGUAL | Status: AC
  Filled 2019-06-28: qty 2

## 2019-06-28 MED ORDER — IOHEXOL 350 MG/ML SOLN
80.0000 mL | Freq: Once | INTRAVENOUS | Status: AC | PRN
  Administered 2019-06-28: 80 mL via INTRAVENOUS

## 2019-06-28 NOTE — Progress Notes (Signed)
Pt stated she took her premedications of benedryl and predinisone today, as prescribed by her physician, prior to CTA.

## 2019-06-28 NOTE — Progress Notes (Signed)
Pt tolerated procedure without incident.  Pt C/O slight headache, caffeinated beverage provided.  PIV removed, dressing applied.  Discharge instructions discussed with patient.  Pt discharged

## 2019-06-30 NOTE — Progress Notes (Signed)
Thank you. I am waiting on FFR, it was sent yesterday right?

## 2019-07-01 NOTE — Progress Notes (Signed)
I have called Mr. Mcglaun regarding her result. Moderate disease, need aggressive risk factor modification

## 2019-07-23 DIAGNOSIS — E663 Overweight: Secondary | ICD-10-CM | POA: Diagnosis not present

## 2019-07-23 DIAGNOSIS — I739 Peripheral vascular disease, unspecified: Secondary | ICD-10-CM | POA: Diagnosis not present

## 2019-07-23 DIAGNOSIS — E039 Hypothyroidism, unspecified: Secondary | ICD-10-CM | POA: Diagnosis not present

## 2019-07-23 DIAGNOSIS — I10 Essential (primary) hypertension: Secondary | ICD-10-CM | POA: Diagnosis not present

## 2019-07-23 DIAGNOSIS — I4891 Unspecified atrial fibrillation: Secondary | ICD-10-CM | POA: Diagnosis not present

## 2019-07-23 DIAGNOSIS — D692 Other nonthrombocytopenic purpura: Secondary | ICD-10-CM | POA: Diagnosis not present

## 2019-07-23 DIAGNOSIS — E7800 Pure hypercholesterolemia, unspecified: Secondary | ICD-10-CM | POA: Diagnosis not present

## 2019-07-23 DIAGNOSIS — R7302 Impaired glucose tolerance (oral): Secondary | ICD-10-CM | POA: Diagnosis not present

## 2019-07-23 DIAGNOSIS — I701 Atherosclerosis of renal artery: Secondary | ICD-10-CM | POA: Diagnosis not present

## 2019-07-23 DIAGNOSIS — G43909 Migraine, unspecified, not intractable, without status migrainosus: Secondary | ICD-10-CM | POA: Diagnosis not present

## 2019-07-29 DIAGNOSIS — E038 Other specified hypothyroidism: Secondary | ICD-10-CM | POA: Diagnosis not present

## 2019-07-29 DIAGNOSIS — I10 Essential (primary) hypertension: Secondary | ICD-10-CM | POA: Diagnosis not present

## 2019-07-29 DIAGNOSIS — E7800 Pure hypercholesterolemia, unspecified: Secondary | ICD-10-CM | POA: Diagnosis not present

## 2019-07-29 DIAGNOSIS — R7302 Impaired glucose tolerance (oral): Secondary | ICD-10-CM | POA: Diagnosis not present

## 2019-08-06 ENCOUNTER — Telehealth: Payer: Self-pay | Admitting: *Deleted

## 2019-08-06 IMAGING — CT CT HEAR MORPH WITH CTA COR WITH SCORE WITH CA WITH CONTRAST AND
1 of 8 series · 9 of 20 positions shown, 12 images · non-contrast
Comparison: None.
COMPARISON: None.

Addendum:
EXAM:
OVER-READ INTERPRETATION  CT CHEST

The following report is an over-read performed by radiologist Dr.
Nya Hall [REDACTED] on 06/28/2019. This
over-read does not include interpretation of cardiac or coronary
anatomy or pathology. The coronary calcium score/coronary CTA
interpretation by the cardiologist is attached.
CLINICAL DATA: 78-year-old male female with HTN, HLP, PAF, PAD and
chest pain.
Cardiac/Coronary  CT
TECHNIQUE: The patient was scanned on a [employer] Force scanner.

[Series 10: multiphase · axial · 0.39mm/px · z∈[+1094,+1197]mm · 9 of 1926 slices shown, 12 images]
[im 193/1926  vessel]
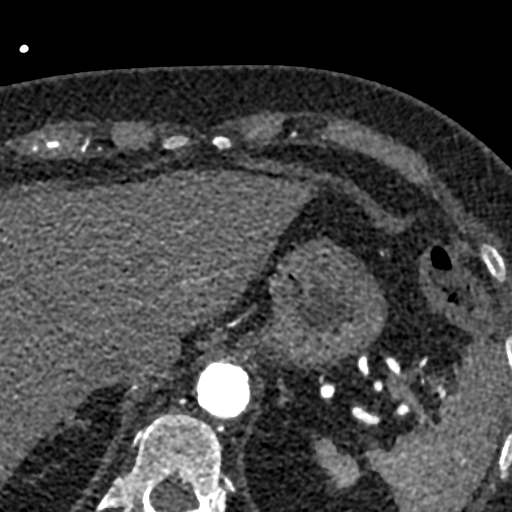
[im 193/1926  lung]
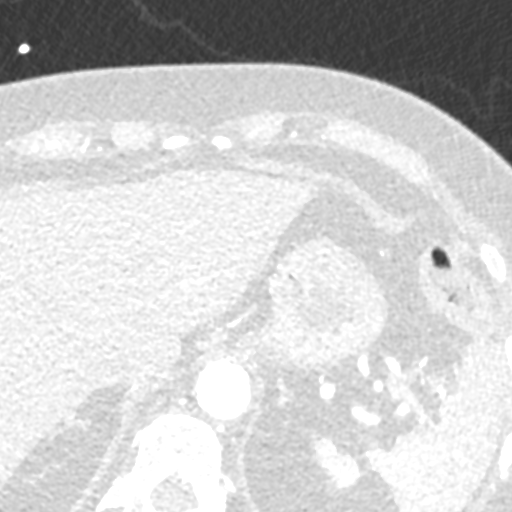
[im 386/1926  vessel]
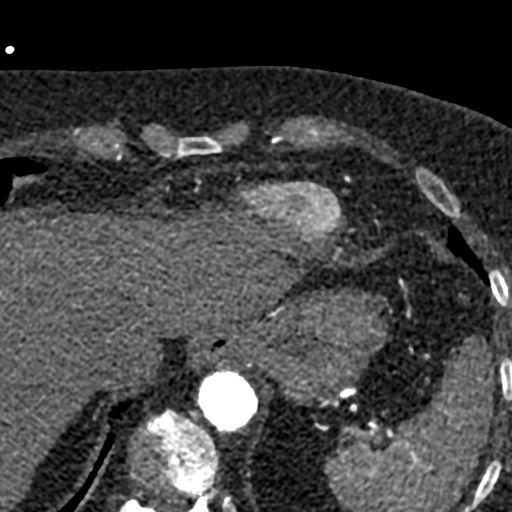
[im 578/1926  vessel]
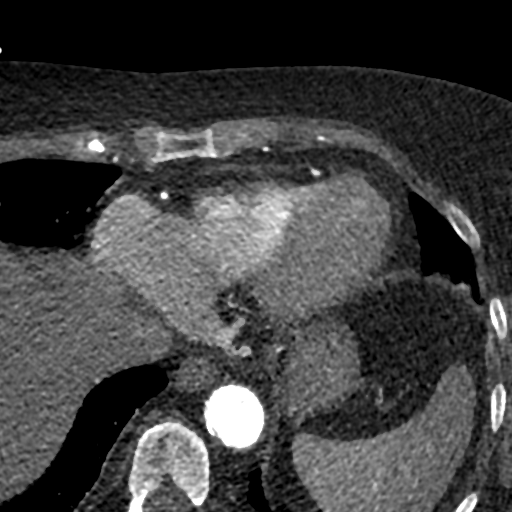
[im 771/1926  vessel]
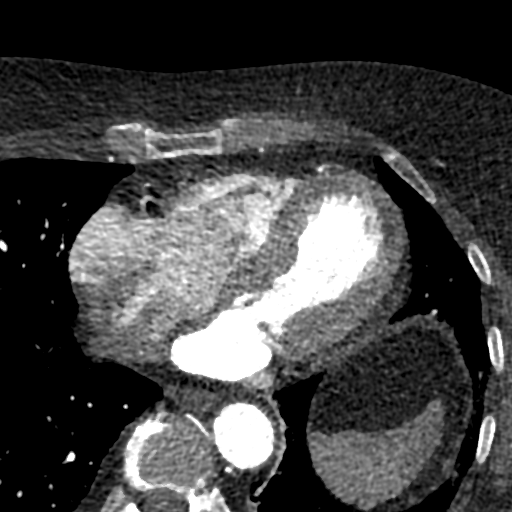
[im 963/1926  vessel]
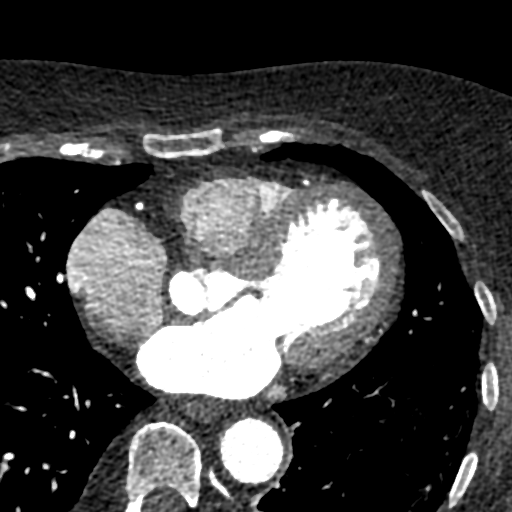
[im 963/1926  lung]
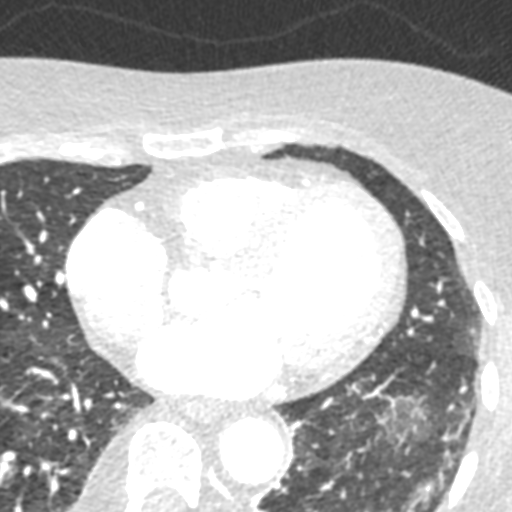
[im 1156/1926  vessel]
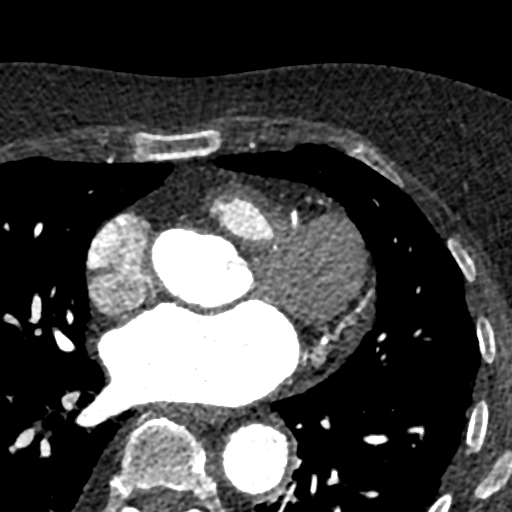
[im 1348/1926  vessel]
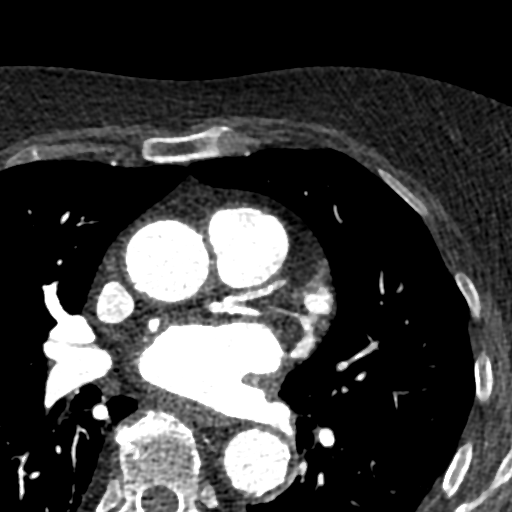
[im 1541/1926  vessel]
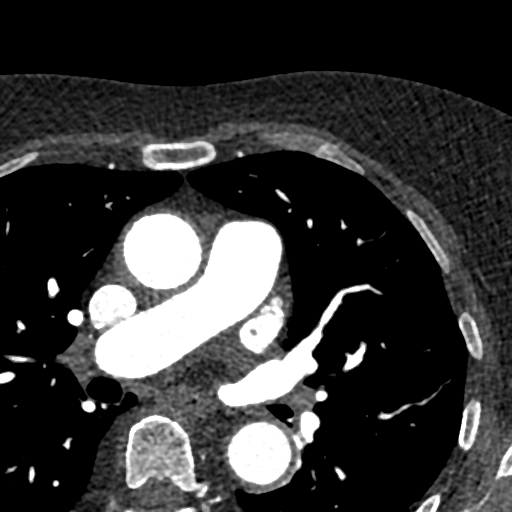
[im 1733/1926  vessel]
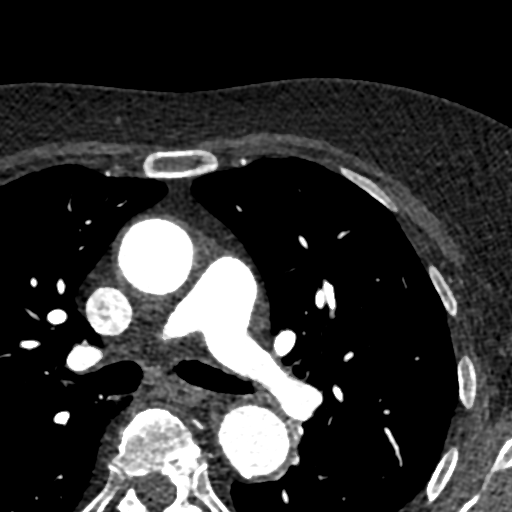
[im 1733/1926  lung]
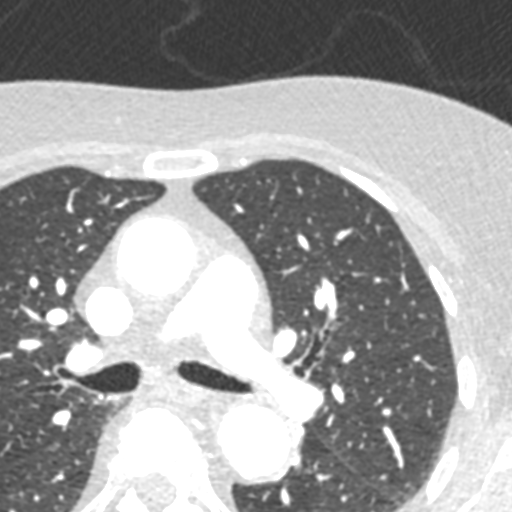

[9 of 20 positions shown; findings below may reference images not displayed]

FINDINGS: Limited view of the lung parenchyma demonstrates no suspicious
nodularity. Airways are normal.

Limited view of the mediastinum demonstrates no adenopathy.
Esophagus normal.

Limited view of the upper abdomen demonstrates a LEFT renal
calculus.

Limited view of the skeleton and chest wall is unremarkable.
IMPRESSION: No significant extracardiac findings.
FINDINGS: A 120 kV prospective scan was triggered in the descending thoracic
aorta at 111 HU's. Axial non-contrast 3 mm slices were carried out
through the heart. The data set was analyzed on a dedicated work
station and scored using the Agatson method. Gantry rotation speed
was 250 msecs and collimation was .6 mm. 50 mg of PO Metoprolol and
0.8 mg of sl NTG was given. The 3D data set was reconstructed in 5%
intervals of the 67-82 % of the R-R cycle. Diastolic phases were
analyzed on a dedicated work station using MPR, MIP and VRT modes.
The patient received 80 cc of contrast.

Aorta: Normal size. Mild to moderate diffuse, mild atherosclerotic
plaque. No dissection.

Aortic Valve:  Trileaflet.  No calcifications.

Coronary Arteries:  Normal coronary origin.  Right dominance.

RCA is a large dominant artery that gives rise to PDA and PLVB.
There is no significant plaque.

Left main is a large artery that gives rise to LAD and LCX arteries.
Distal left main has minimal calcified plaque with stenosis 0-25%.

LAD is a large vessel that gives rise to one diagonal artery and
wraps around the apex. Ostial/proximal LAD has a moderate mixed
plaque with stenosis 50-69%. Mid and distal LAD have only minimal
plaque.

D1 is a small branch.

LCX is a non-dominant artery that gives rise to one large OM1
branch. There is motion affecting LCX read but appears to have only
mild CAD.

Other findings:

Normal pulmonary vein drainage into the left atrium.

Normal let atrial appendage without a thrombus.

Normal size of the pulmonary artery.
IMPRESSION: 1. Coronary calcium score of 117. This was 54 percentile for age and
sex matched control.

2. Normal coronary origin with right dominance.

3. This study quality is affected by motion, however there is
moderate CAD in the proximal LAD. Additional analysis with CT FFR
will be submitted. Aggressive risk factor modification is
recommended.

*** End of Addendum ***
EXAM:
OVER-READ INTERPRETATION  CT CHEST

The following report is an over-read performed by radiologist Dr.
Nya Hall [REDACTED] on 06/28/2019. This
over-read does not include interpretation of cardiac or coronary
anatomy or pathology. The coronary calcium score/coronary CTA
interpretation by the cardiologist is attached.
FINDINGS: Limited view of the lung parenchyma demonstrates no suspicious
nodularity. Airways are normal.

Limited view of the mediastinum demonstrates no adenopathy.
Esophagus normal.

Limited view of the upper abdomen demonstrates a LEFT renal
calculus.

Limited view of the skeleton and chest wall is unremarkable.
IMPRESSION: No significant extracardiac findings.

## 2019-08-06 NOTE — Telephone Encounter (Signed)
Spoke with pt, she is not able to take crestor or lipitor because of side effects. In the past when the pravastatin was increased, her liver function elevated. She will talk to the pharmacist about other options if that is what dr berry would like. Will forward to dr berry to review and advise.

## 2019-08-06 NOTE — Telephone Encounter (Signed)
-----   Message from Lorretta Harp, MD sent at 08/05/2019  2:46 PM EDT ----- Lipid profile performed 07/29/2024 PCP revealed LDL of 97 on Zetia.  Not at goal for secondary prevention.  Call and inquire about diet.  May benefit from low-dose statin if tolerant to that.  (Crestor 5 mg 3 times a week)

## 2019-08-06 NOTE — Telephone Encounter (Signed)
Given her age and only mildly elevated LDL I think we can hold off of statin therapy at this time

## 2019-08-09 NOTE — Telephone Encounter (Signed)
Left message for patient of dr berry's recommendations. 

## 2019-09-19 ENCOUNTER — Emergency Department (HOSPITAL_BASED_OUTPATIENT_CLINIC_OR_DEPARTMENT_OTHER)
Admission: EM | Admit: 2019-09-19 | Discharge: 2019-09-19 | Disposition: A | Discharge status: Home / Self Care | Payer: PPO | Attending: Emergency Medicine | Admitting: Emergency Medicine

## 2019-09-19 ENCOUNTER — Other Ambulatory Visit: Payer: Self-pay

## 2019-09-19 ENCOUNTER — Emergency Department (HOSPITAL_BASED_OUTPATIENT_CLINIC_OR_DEPARTMENT_OTHER): Payer: PPO

## 2019-09-19 ENCOUNTER — Encounter (HOSPITAL_BASED_OUTPATIENT_CLINIC_OR_DEPARTMENT_OTHER): Payer: Self-pay | Admitting: *Deleted

## 2019-09-19 DIAGNOSIS — Y999 Unspecified external cause status: Secondary | ICD-10-CM | POA: Insufficient documentation

## 2019-09-19 DIAGNOSIS — Z7722 Contact with and (suspected) exposure to environmental tobacco smoke (acute) (chronic): Secondary | ICD-10-CM | POA: Diagnosis not present

## 2019-09-19 DIAGNOSIS — I10 Essential (primary) hypertension: Secondary | ICD-10-CM | POA: Diagnosis not present

## 2019-09-19 DIAGNOSIS — S80811A Abrasion, right lower leg, initial encounter: Secondary | ICD-10-CM | POA: Diagnosis not present

## 2019-09-19 DIAGNOSIS — S0101XA Laceration without foreign body of scalp, initial encounter: Secondary | ICD-10-CM | POA: Insufficient documentation

## 2019-09-19 DIAGNOSIS — S0003XA Contusion of scalp, initial encounter: Secondary | ICD-10-CM | POA: Diagnosis not present

## 2019-09-19 DIAGNOSIS — Z79899 Other long term (current) drug therapy: Secondary | ICD-10-CM | POA: Insufficient documentation

## 2019-09-19 DIAGNOSIS — Y92002 Bathroom of unspecified non-institutional (private) residence single-family (private) house as the place of occurrence of the external cause: Secondary | ICD-10-CM | POA: Insufficient documentation

## 2019-09-19 DIAGNOSIS — I2510 Atherosclerotic heart disease of native coronary artery without angina pectoris: Secondary | ICD-10-CM | POA: Insufficient documentation

## 2019-09-19 DIAGNOSIS — Z23 Encounter for immunization: Secondary | ICD-10-CM | POA: Diagnosis not present

## 2019-09-19 DIAGNOSIS — W01198A Fall on same level from slipping, tripping and stumbling with subsequent striking against other object, initial encounter: Secondary | ICD-10-CM | POA: Insufficient documentation

## 2019-09-19 DIAGNOSIS — R51 Headache: Secondary | ICD-10-CM | POA: Insufficient documentation

## 2019-09-19 DIAGNOSIS — T148XXA Other injury of unspecified body region, initial encounter: Secondary | ICD-10-CM

## 2019-09-19 DIAGNOSIS — Z7902 Long term (current) use of antithrombotics/antiplatelets: Secondary | ICD-10-CM | POA: Diagnosis not present

## 2019-09-19 DIAGNOSIS — E039 Hypothyroidism, unspecified: Secondary | ICD-10-CM | POA: Insufficient documentation

## 2019-09-19 DIAGNOSIS — Y93E1 Activity, personal bathing and showering: Secondary | ICD-10-CM | POA: Diagnosis not present

## 2019-09-19 DIAGNOSIS — W010XXA Fall on same level from slipping, tripping and stumbling without subsequent striking against object, initial encounter: Secondary | ICD-10-CM

## 2019-09-19 MED ORDER — ONDANSETRON 8 MG PO TBDP
8.0000 mg | ORAL_TABLET | Freq: Once | ORAL | Status: AC
  Administered 2019-09-19: 8 mg via ORAL
  Filled 2019-09-19: qty 1

## 2019-09-19 MED ORDER — ACETAMINOPHEN 500 MG PO TABS
1000.0000 mg | ORAL_TABLET | Freq: Once | ORAL | Status: AC
  Administered 2019-09-19: 1000 mg via ORAL
  Filled 2019-09-19: qty 2

## 2019-09-19 MED ORDER — DILTIAZEM HCL ER COATED BEADS 120 MG PO CP24
120.0000 mg | ORAL_CAPSULE | Freq: Once | ORAL | Status: AC
  Administered 2019-09-19: 120 mg via ORAL
  Filled 2019-09-19: qty 1

## 2019-09-19 MED ORDER — TETANUS-DIPHTH-ACELL PERTUSSIS 5-2.5-18.5 LF-MCG/0.5 IM SUSP
0.5000 mL | Freq: Once | INTRAMUSCULAR | Status: AC
  Administered 2019-09-19: 0.5 mL via INTRAMUSCULAR
  Filled 2019-09-19: qty 0.5

## 2019-09-19 MED ORDER — BACITRACIN ZINC 500 UNIT/GM EX OINT
TOPICAL_OINTMENT | Freq: Once | CUTANEOUS | Status: AC
  Administered 2019-09-19: 1 via TOPICAL
  Filled 2019-09-19: qty 28.35

## 2019-09-19 MED ORDER — LIDOCAINE-EPINEPHRINE (PF) 2 %-1:200000 IJ SOLN
INTRAMUSCULAR | Status: AC
  Filled 2019-09-19: qty 10

## 2019-09-19 MED ORDER — LIDOCAINE-EPINEPHRINE (PF) 2 %-1:200000 IJ SOLN
20.0000 mL | Freq: Once | INTRAMUSCULAR | Status: AC
  Administered 2019-09-19: 20 mL
  Filled 2019-09-19: qty 20

## 2019-09-19 NOTE — ED Provider Notes (Signed)
[facility] Provider Note   CSN: [id number] Arrival date & time: 09/19/19  2036     History   Chief Complaint Chief Complaint  Patient presents with   Fall    HPI Gabriela Moyer is a 78 y.o. female.     Patient c/o trip and fall at home today, getting in/out of garden tub. Hit back of head on door, and has abrasion to right lower leg. Does take plavix. No loc. Dull headache post hitting head. Laceration to scalp. Tetanus unknown. Symptoms acute onset, moderate, episodic. No faintness or dizziness prior to fall. Mild nausea. No vomiting. No neck or back pain. No numbness or weakness. States felt fine all day prior to trip and fall tonight.   The history is provided by the patient.  Fall Associated symptoms include headaches. Pertinent negatives include no chest pain, no abdominal pain and no shortness of breath.    Past Medical History:  Diagnosis Date   Carotid arterial disease ([place])    a. 08/2016 Carotid U/S: 1-39% bilat ICA stenosis, >50 R ECA stenosis.   Diastolic dysfunction    a. 11/2016 Echo: EF 65-70%, Gr1 DD.   History of stress test    a. 12/2016 Lexiscan MV: EF 73%, no ischemia/infarct.   Hyperlipidemia    Hypertension    PAF (paroxysmal atrial fibrillation) ([place])    a. 11/2016 AF RVR in ED-->converted on IV dilt-->CHA2DS2VASc = 4-->Xarelto.   Renal artery stenosis ([place])    a. 06/2009 s/p R Renal Artery PTA/stenting; b. 08/2016 Renal Duplex: nl LRA, >60% RRA stenosis- f/u 1 yr.   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 08/2014 Upper Ext Duplex: R distal [place] & Ax - 70-99% diam reduction, L distal [place] & Ax - 50-69%--stable.    Patient Active Problem List   Diagnosis Date Noted   Afib ([place]) 12/18/2016   Atrial fibrillation with RVR (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 Jackie Plum)   Carotid Doppler  12/2010   R & L ICA with small amount fibrous plaque; R distal subclav. 70-99% diameter reduction; L mid subclav 50-69% diameter reduction   Lower Extremity Arterial Doppler  03/2008   right ABI - mild arterial insuff at rest; R CIA with less than 50% diameter reduction, R & L SFA with less than 50% diameter reduction   NM MYOCAR PERF WALL MOTION  09/2009   bruce myoview - normal perfusion, EF 915, low risk scan   RENAL ANGIOPLASTY  11/07/2009   5x42mm Cordis Genesis Aviator stent to ostium of right renal artery (Dr. Adora Fridge)   Renal Artery Doppler  04/2012   celiac artery with >50% diameter reduction; R renal artery stent w/mildly elevated velocities (1-59% diameter reduction)   TRANSTHORACIC ECHOCARDIOGRAM  08/28/2009   EF=>55%; trace TR; mild AV regurg, mild pulm valve regurg   Upper Extremity Arterial Doppler  04/2012   right brachial pressure 34mmHg, left 131mmHg; R distal subclavian & axillary arteries 70-99% diameter reduction; L distal subclavian/axillary & brachial arteries (50-69% diameter reduction)     OB History   No obstetric history on file.  Home Medications    Prior to Admission medications   Medication Sig Start Date End Date Taking? Authorizing Provider  clopidogrel (PLAVIX) 75 MG tablet TAKE 1 TABLET BY MOUTH DAILY 05/07/19   Lennette BihariKelly, Thomas A, MD  diltiazem (CARDIZEM CD) 120 MG 24 hr capsule TAKE 1 CAPSULE(120 MG) BY MOUTH DAILY 06/28/19   Lennette BihariKelly, Thomas A, MD  ezetimibe (ZETIA) 10 MG tablet TAKE 1 TABLET(10 MG) BY MOUTH DAILY 06/28/19   Lennette BihariKelly, Thomas A, MD  flecainide (TAMBOCOR) 50 MG tablet Take 1 tablet (50 mg total) by mouth 2 (two) times daily as needed (AFIB AND INCREASED HR). 03/03/18    Jodelle GrossLawrence, Kathryn M, NP  hydrochlorothiazide (HYDRODIURIL) 25 MG tablet TAKE 1 TABLET(25 MG) BY MOUTH DAILY 06/28/19   Lennette BihariKelly, Thomas A, MD  levothyroxine (SYNTHROID, LEVOTHROID) 25 MCG tablet Take 1 tablet by mouth daily. 05/21/18   [provider]  metoprolol succinate (TOPROL-XL) 100 MG 24 hr tablet TAKE 1 TABLET BY MOUTH EVERY DAY WITH OR IMMEDIATELY AFTER A MEAL 12/01/18   Lennette BihariKelly, Thomas A, MD  metoprolol tartrate (LOPRESSOR) 50 MG tablet Take 2 hours prior to procedure 05/26/19   Azalee CourseMeng, Hao, PA  pravastatin (PRAVACHOL) 20 MG tablet Take 1 tablet (20 mg total) by mouth every evening. 03/25/19   Lennette BihariKelly, Thomas A, MD  predniSONE (DELTASONE) 50 MG tablet Take 1 tablet (50 mg total) by mouth every 6 (six) hours. Take 50mg  every 6 hours for 3 doses prior to CT scan 06/25/19   Azalee CourseMeng, Hao, PA    Family History Family History  Problem Relation Age of Onset   Heart disease Mother    Stroke Mother    Cancer Father    Heart Problems Maternal Grandmother    Heart Problems Maternal Grandfather    Cancer Brother     Social History Social History   Tobacco Use   Smoking status: Passive Smoke Exposure - Never Smoker   Smokeless tobacco: Never Used  Substance Use Topics   Alcohol use: No   Drug use: Never     Allergies   Codeine, Contrast media [iodinated diagnostic agents], and Morphine   Review of Systems Review of Systems  Constitutional: Negative for fever.  HENT: Negative for sore throat.   Eyes: Negative for pain and visual disturbance.  Respiratory: Negative for shortness of breath.   Cardiovascular: Negative for chest pain.  Gastrointestinal: Positive for nausea. Negative for abdominal pain and vomiting.  Genitourinary: Negative for flank pain.  Musculoskeletal: Negative for back pain and neck pain.  Skin: Positive for wound.  Neurological: Positive for headaches. Negative for speech difficulty, weakness and numbness.  Hematological: Does not bruise/bleed easily.   Psychiatric/Behavioral: Negative for confusion.     Physical Exam Updated Vital Signs BP (!) 160/62 (BP Location: Left Arm)    Pulse 72    Temp 98.2 F (36.8 C) (Oral)    Resp 18    Ht 1.575 m (5\' 2" )    Wt 72.6 kg    SpO2 97%    BMI 29.26 kg/m   Physical Exam Vitals signs and nursing note reviewed.  Constitutional:      Appearance: Normal appearance. She is well-developed.  HENT:     Head:     Comments: Contusion and laceration to posterior scalp.     Right Ear: Tympanic membrane normal.     Left Ear: Tympanic membrane normal.     Nose: Nose normal.     Mouth/Throat:     Mouth: Mucous membranes are  moist.  Eyes:     General: No scleral icterus.    Extraocular Movements: Extraocular movements intact.     Conjunctiva/sclera: Conjunctivae normal.     Pupils: Pupils are equal, round, and reactive to light.  Neck:     Musculoskeletal: Normal range of motion and neck supple. No neck rigidity or muscular tenderness.     Trachea: No tracheal deviation.  Cardiovascular:     Rate and Rhythm: Normal rate and regular rhythm.     Pulses: Normal pulses.     Heart sounds: Normal heart sounds. No murmur. No friction rub. No gallop.   Pulmonary:     Effort: Pulmonary effort is normal. No respiratory distress.     Breath sounds: Normal breath sounds.  Chest:     Chest wall: No tenderness.  Abdominal:     General: Bowel sounds are normal. There is no distension.     Palpations: Abdomen is soft.     Tenderness: There is no abdominal tenderness. There is no guarding.  Genitourinary:    Comments: No cva tenderness.  Musculoskeletal:        General: No swelling.     Comments: CTLS spine, non tender, aligned, no step off. Good rom bil ext without pain or focal bony tenderness.  Abrasion right anterior lower leg.  Skin:    General: Skin is warm and dry.     Findings: No rash.  Neurological:     Mental Status: She is alert.     Comments: Alert, speech normal. Motor/sens grossly intact  bil. Steady gait.   Psychiatric:        Mood and Affect: Mood normal.      ED Treatments / Results  Labs (all labs ordered are listed, but only abnormal results are displayed) Labs Reviewed - No data to display  EKG None  Radiology Ct Head Wo Contrast  Result Date: 09/19/2019 CLINICAL DATA:  Patient fell and hit head today. Patient is on Plavix. No reported loss of consciousness. EXAM: CT HEAD WITHOUT CONTRAST TECHNIQUE: Contiguous axial images were obtained from the base of the skull through the vertex without intravenous contrast. COMPARISON:  CT angio head 04/03/2006. FINDINGS: Brain: No evidence for acute infarction, hemorrhage, mass lesion, hydrocephalus, or extra-axial fluid. Mild cerebral and cerebellar atrophy, not unexpected for age. Hypoattenuation of white matter, consistent with chronic microvascular ischemic change. Vascular: No hyperdense vessel or unexpected calcification. Skull: Normal. Negative for fracture or focal lesion. Sinuses/Orbits: No acute finding. Other: Large LEFT posterior parietal scalp hematoma with small laceration. No foreign body. Compared with priors, from 2007, cerebral atrophy and white matter disease have progressed. IMPRESSION: 1. Atrophy and small vessel disease. No acute intracranial findings. 2. Large LEFT posterior parietal scalp hematoma. No skull fracture or intracranial hemorrhage. Electronically Signed   By: Elsie StainJohn T Curnes M.D.   On: 09/19/2019 22:16    Procedures .Marland Kitchen.Laceration Repair  Date/Time: 09/19/2019 10:47 PM Performed by: Cathren LaineSteinl, Taji Barretto, MD Authorized by: Cathren LaineSteinl, Shawnique Mariotti, MD   Consent:    Consent given by:  Patient Anesthesia (see MAR for exact dosages):    Anesthesia method:  Local infiltration   Local anesthetic:  Lidocaine 2% WITH epi Laceration details:    Location:  Scalp   Scalp location:  Occipital   Length (cm):  3 Repair type:    Repair type:  Simple Pre-procedure details:    Preparation:  Patient was prepped and draped  in usual sterile fashion Exploration:    Wound exploration: entire depth of wound  probed and visualized     Contaminated: no   Treatment:    Area cleansed with:  Betadine   Amount of cleaning:  Standard   Irrigation solution:  Sterile saline   Irrigation method:  Syringe   Visualized foreign bodies/material removed: no   Skin repair:    Repair method:  Sutures   Suture size:  4-0   Suture material:  Prolene   Suture technique:  Simple interrupted   Number of sutures:  3 Approximation:    Approximation:  Close Post-procedure details:    Dressing:  Antibiotic ointment   Patient tolerance of procedure:  Tolerated well, no immediate complications   (including critical care time)  Medications Ordered in ED Medications  lidocaine-EPINEPHrine (XYLOCAINE W/EPI) 2 %-1:200000 (PF) injection (has no administration in time range)  Tdap (BOOSTRIX) injection 0.5 mL (has no administration in time range)  bacitracin ointment (1 application Topical Given 09/19/19 2139)  lidocaine-EPINEPHrine (XYLOCAINE W/EPI) 2 %-1:200000 (PF) injection 20 mL (20 mLs Infiltration Given by Other 09/19/19 2140)     Initial Impression / Assessment and Plan / ED Course  I have reviewed the triage vital signs and the nursing notes.  Pertinent labs & imaging results that were available during my care of the patient were reviewed by me and considered in my medical decision making (see chart for details).  CT ordered.  Reviewed nursing notes and prior charts for additional history.   Tetanus im.  Acetaminophen po. zofran po.  Wound sutured.   CT reviewed by me - no acute hem.   Bacitracin and dressing to leg wound.     Final Clinical Impressions(s) / ED Diagnoses   Final diagnoses:  None    ED Discharge Orders    None       Cathren Laine, MD 09/19/19 2248

## 2019-09-19 NOTE — ED Triage Notes (Signed)
Pt reports she tripped while stepping into tub today and hit her head. Pt is on blood thinners. She has an abrasion to her right lower leg that is oozing blood. Denies LOC. Small lac/abrasion and large hematoma present to back of scalp

## 2019-09-19 NOTE — Discharge Instructions (Addendum)
It was our pleasure to provide your ER care today - we hope that you feel better.  Keep wound very clean. May shower, and pad area gently dry.  Have sutures removed, your doctor or urgent care, in 7-10 days.  Take acetaminophen as need.  Return to ER if worse, new symptoms, severe or worsening pain, severe headache, vomiting, infection of wound, or other concern.

## 2019-09-22 ENCOUNTER — Other Ambulatory Visit: Payer: Self-pay | Admitting: Cardiovascular Disease

## 2019-09-24 ENCOUNTER — Other Ambulatory Visit: Payer: Self-pay

## 2019-09-24 MED ORDER — HYDROCHLOROTHIAZIDE 25 MG PO TABS: ORAL_TABLET | ORAL | 0 refills | Status: DC

## 2019-09-24 MED ORDER — PRAVASTATIN SODIUM 20 MG PO TABS: 20.0000 mg | ORAL_TABLET | Freq: Every evening | ORAL | 1 refills | Status: DC

## 2019-09-24 MED ORDER — DILTIAZEM HCL ER COATED BEADS 120 MG PO CP24: ORAL_CAPSULE | ORAL | 0 refills | Status: DC

## 2019-09-29 ENCOUNTER — Emergency Department (HOSPITAL_BASED_OUTPATIENT_CLINIC_OR_DEPARTMENT_OTHER)
Admission: EM | Admit: 2019-09-29 | Discharge: 2019-09-29 | Disposition: A | Discharge status: Home / Self Care | Payer: PPO | Attending: Emergency Medicine | Admitting: Emergency Medicine

## 2019-09-29 ENCOUNTER — Encounter (HOSPITAL_BASED_OUTPATIENT_CLINIC_OR_DEPARTMENT_OTHER): Payer: Self-pay | Admitting: Emergency Medicine

## 2019-09-29 ENCOUNTER — Other Ambulatory Visit: Payer: Self-pay

## 2019-09-29 DIAGNOSIS — I2510 Atherosclerotic heart disease of native coronary artery without angina pectoris: Secondary | ICD-10-CM | POA: Diagnosis not present

## 2019-09-29 DIAGNOSIS — Z4802 Encounter for removal of sutures: Secondary | ICD-10-CM | POA: Diagnosis not present

## 2019-09-29 DIAGNOSIS — Z79899 Other long term (current) drug therapy: Secondary | ICD-10-CM | POA: Insufficient documentation

## 2019-09-29 DIAGNOSIS — I10 Essential (primary) hypertension: Secondary | ICD-10-CM | POA: Insufficient documentation

## 2019-09-29 DIAGNOSIS — S0101XD Laceration without foreign body of scalp, subsequent encounter: Secondary | ICD-10-CM | POA: Diagnosis not present

## 2019-09-29 DIAGNOSIS — E039 Hypothyroidism, unspecified: Secondary | ICD-10-CM | POA: Insufficient documentation

## 2019-09-29 DIAGNOSIS — Z7722 Contact with and (suspected) exposure to environmental tobacco smoke (acute) (chronic): Secondary | ICD-10-CM | POA: Insufficient documentation

## 2019-09-29 DIAGNOSIS — Z7902 Long term (current) use of antithrombotics/antiplatelets: Secondary | ICD-10-CM | POA: Diagnosis not present

## 2019-09-29 DIAGNOSIS — X58XXXD Exposure to other specified factors, subsequent encounter: Secondary | ICD-10-CM | POA: Diagnosis not present

## 2019-09-29 NOTE — ED Triage Notes (Signed)
Suture removal to back of head, no issues related since insertion.

## 2019-09-29 NOTE — ED Notes (Signed)
ED Provider at bedside. 

## 2019-09-29 NOTE — Discharge Instructions (Addendum)
You are seen in the ER for a suture removal.  3 stitches were removed.  Please keep the area covered when in the sunlight to avoid scarring.  Return to the ER for new or worsening symptoms including but not limited to increased pain, bleeding from the site, fever, chills, redness from the area, purulent drainage, or any other concerns.

## 2019-09-29 NOTE — ED Provider Notes (Signed)
[facility] EMERGENCY DEPARTMENT Provider Note   CSN: [id number] Arrival date & time: 09/29/19  1058     History   Chief Complaint Chief Complaint  Patient presents with  . Suture / Staple Removal    HPI Gabriela Moyer is a 78 y.o. female with PMHX as documented below who presents to the ED for suture removal today.  Patient seen in the emergency department 09/19/2019 after head injury with scalp laceration.  Tetanus was updated.  Laceration was closed with 3 sutures.  Patient states the wound has been doing well.  Denies fever, chills, drainage, or surrounding redness.  No alleviating or aggravating factors.       .     Past Medical History:  Diagnosis Date  . Carotid arterial disease ([facility])    a. 08/2016 Carotid U/S: 1-39% bilat ICA stenosis, >50 R ECA stenosis.  . Diastolic dysfunction    a. 11/2016 Echo: EF 65-70%, Gr1 DD.  Marland Kitchen. History of stress test    a. 12/2016 Lexiscan MV: EF 73%, no ischemia/infarct.  . Hyperlipidemia   . Hypertension   . PAF (paroxysmal atrial fibrillation) ([facility])    a. 11/2016 AF RVR in ED-->converted on IV dilt-->CHA2DS2VASc = 4-->Xarelto.  . Renal artery stenosis ([facility])    a. 06/2009 s/p R Renal Artery PTA/stenting; b. 08/2016 Renal Duplex: nl LRA, >60% RRA stenosis- f/u 1 yr.  . Subclavian arterial stenosis ([facility])    a. 08/2014 Upper Ext Duplex: R distal [place] & Ax - 70-99% diam reduction, L distal [place] & Ax - 50-69%--stable.    Patient Active Problem List   Diagnosis Date Noted  . Afib ([facility]) 12/18/2016  . Atrial fibrillation with RVR ([facility]) 12/18/2016  . Ankle swelling 06/10/2015  . Hyperlipidemia LDL goal < 70 04/28/2014  . 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 Hypothyroidism 10/31/2013  . Essential hypertension 07/26/2008  . RENAL ARTERY STENOSIS 07/26/2008  . 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 CARDIAC CATHETERIZATION  11/03/2007   atherosclerotic right renal artery (Dr. Evlyn CourierJ. Gangi)  . Carotid Doppler  12/2010   R & L ICA with small amount fibrous plaque; R distal subclav. 70-99% diameter reduction; L mid subclav 50-69% diameter reduction  . Lower Extremity Arterial Doppler  03/2008   right ABI - mild arterial insuff at rest; R CIA with less than 50% diameter reduction, R & L SFA with less than 50% diameter reduction  . NM MYOCAR PERF WALL MOTION  09/2009   bruce myoview - normal perfusion, EF 915, low risk scan  . RENAL ANGIOPLASTY  11/07/2009   [id number] Cordis Genesis Aviator stent to ostium of right renal artery (Dr. Erlene QuanJ. Berry)  . Renal Artery Doppler  04/2012   celiac artery with >50% diameter reduction; R renal artery stent w/mildly elevated velocities (1-59% diameter reduction)  . TRANSTHORACIC ECHOCARDIOGRAM  08/28/2009   EF=>55%; trace TR; mild AV regurg, mild pulm valve regurg  . Upper Extremity Arterial Doppler  04/2012   right brachial pressure 96mmHg, left 120mmHg; R distal subclavian & axillary arteries 70-99% diameter reduction; L distal subclavian/axillary & brachial arteries (50-69% diameter reduction)     OB History   No obstetric history on file.      Home Medications    Prior to Admission medications   Medication Sig Start Date End Date Taking? Authorizing Provider  clopidogrel (PLAVIX) 75 MG tablet TAKE 1 TABLET BY MOUTH DAILY 05/07/19  Troy Sine, MD  diltiazem (CARDIZEM CD) 120 MG 24 hr capsule TAKE 1 CAPSULE(120 MG) BY MOUTH DAILY 09/24/19   Troy Sine, MD  ezetimibe (ZETIA) 10 MG tablet TAKE 1 TABLET(10 MG) BY MOUTH DAILY 09/23/19   Troy Sine, MD  flecainide (TAMBOCOR) 50 MG tablet Take 1 tablet (50 mg total) by mouth 2 (two) times daily as needed (AFIB AND INCREASED HR). 03/03/18   Lendon Colonel, NP  hydrochlorothiazide (HYDRODIURIL) 25 MG tablet TAKE 1 TABLET(25 MG) BY MOUTH DAILY 09/24/19   Troy Sine, MD  levothyroxine (SYNTHROID, LEVOTHROID) 25 MCG tablet Take 1 tablet by  mouth daily. 05/21/18   [provider]  metoprolol succinate (TOPROL-XL) 100 MG 24 hr tablet TAKE 1 TABLET BY MOUTH EVERY DAY WITH OR IMMEDIATELY AFTER A MEAL 12/01/18   Troy Sine, MD  metoprolol tartrate (LOPRESSOR) 50 MG tablet Take 2 hours prior to procedure 05/26/19   Almyra Deforest, PA  pravastatin (PRAVACHOL) 20 MG tablet Take 1 tablet (20 mg total) by mouth every evening. 09/24/19   Troy Sine, MD  predniSONE (DELTASONE) 50 MG tablet Take 1 tablet (50 mg total) by mouth every 6 (six) hours. Take 50mg  every 6 hours for 3 doses prior to CT scan 06/25/19   Almyra Deforest, PA    Family History Family History  Problem Relation Age of Onset  . Heart disease Mother   . Stroke Mother   . Cancer Father   . Heart Problems Maternal Grandmother   . Heart Problems Maternal Grandfather   . Cancer Brother     Social History Social History   Tobacco Use  . Smoking status: Passive Smoke Exposure - Never Smoker  . Smokeless tobacco: Never Used  Substance Use Topics  . Alcohol use: No  . Drug use: Never     Allergies   Codeine, Contrast media [iodinated diagnostic agents], and Morphine   Review of Systems Review of Systems  Constitutional: Negative for chills and fever.  Eyes: Negative for visual disturbance.  Skin: Positive for wound. Negative for color change and rash.  Neurological: Negative for headaches.     Physical Exam Updated Vital Signs BP (!) 143/57   Pulse 74   Temp 98 F (36.7 C)   Resp 16   Ht 5\' 3"  (1.6 m)   Wt 72.6 kg   SpO2 98%   BMI 28.34 kg/m   Physical Exam Vitals signs and nursing note reviewed.  Constitutional:      General: She is not in acute distress.    Appearance: She is well-developed.  HENT:     Head: Normocephalic.   Eyes:     General:        Right eye: No discharge.        Left eye: No discharge.     Conjunctiva/sclera: Conjunctivae normal.  Neurological:     Mental Status: She is alert.     Comments: Clear speech.    Psychiatric:        Behavior: Behavior normal.        Thought Content: Thought content normal.       ED Treatments / Results  Labs (all labs ordered are listed, but only abnormal results are displayed) Labs Reviewed - No data to display  EKG None  Radiology No results found.  Procedures .Suture Removal  Date/Time: 09/29/2019 1:56 PM Performed by: Amaryllis Dyke, PA-C Authorized by: Amaryllis Dyke, PA-C   Consent:    Consent  obtained:  Verbal   Consent given by:  Patient   Risks discussed:  Bleeding, pain and wound separation   Alternatives discussed:  No treatment Location:    Location:  Head/neck   Head/neck location:  Scalp Procedure details:    Wound appearance:  No signs of infection   Number of sutures removed:  3 Post-procedure details:    Post-removal:  No dressing applied   Patient tolerance of procedure:  Tolerated well, no immediate complications   (including critical care time)  Medications Ordered in ED Medications - No data to display   Initial Impression / Assessment and Plan / ED Course  I have reviewed the triage vital signs and the nursing notes.  Pertinent labs & imaging results that were available during my care of the patient were reviewed by me and considered in my medical decision making (see chart for details).  Pt to ER for ssuture removal and wound check as above. Procedure tolerated well. No signs of infection. Scar minimization & return precautions given at dc.    Final Clinical Impressions(s) / ED Diagnoses   Final diagnoses:  Visit for suture removal    ED Discharge Orders    None       Cherly Anderson, PA-C 09/29/19 1358    Tegeler, Canary Brim, MD 09/29/19 [phone]

## 2019-10-13 ENCOUNTER — Encounter (HOSPITAL_COMMUNITY): Payer: PPO

## 2019-11-05 ENCOUNTER — Other Ambulatory Visit (HOSPITAL_COMMUNITY): Payer: Self-pay | Admitting: Cardiovascular Disease

## 2019-11-05 ENCOUNTER — Ambulatory Visit (HOSPITAL_COMMUNITY)
Admission: RE | Admit: 2019-11-05 | Discharge: 2019-11-05 | Disposition: A | Discharge status: Home / Self Care | Payer: PPO | Source: Ambulatory Visit | Attending: Internal Medicine | Admitting: Internal Medicine

## 2019-11-05 ENCOUNTER — Encounter: Payer: Self-pay | Admitting: *Deleted

## 2019-11-05 ENCOUNTER — Other Ambulatory Visit: Payer: Self-pay

## 2019-11-05 ENCOUNTER — Other Ambulatory Visit: Payer: Self-pay | Admitting: *Deleted

## 2019-11-05 DIAGNOSIS — Z9889 Other specified postprocedural states: Secondary | ICD-10-CM

## 2019-11-05 DIAGNOSIS — I701 Atherosclerosis of renal artery: Secondary | ICD-10-CM | POA: Insufficient documentation

## 2019-11-10 ENCOUNTER — Other Ambulatory Visit: Payer: Self-pay | Admitting: Cardiovascular Disease

## 2019-12-27 ENCOUNTER — Other Ambulatory Visit: Payer: Self-pay | Admitting: Cardiovascular Disease

## 2019-12-28 ENCOUNTER — Other Ambulatory Visit: Payer: Self-pay

## 2019-12-28 MED ORDER — DILTIAZEM HCL ER COATED BEADS 120 MG PO CP24: ORAL_CAPSULE | ORAL | 0 refills | Status: DC

## 2020-01-11 DIAGNOSIS — E038 Other specified hypothyroidism: Secondary | ICD-10-CM | POA: Diagnosis not present

## 2020-01-11 DIAGNOSIS — E7800 Pure hypercholesterolemia, unspecified: Secondary | ICD-10-CM | POA: Diagnosis not present

## 2020-01-11 DIAGNOSIS — R7302 Impaired glucose tolerance (oral): Secondary | ICD-10-CM | POA: Diagnosis not present

## 2020-01-11 DIAGNOSIS — R82998 Other abnormal findings in urine: Secondary | ICD-10-CM | POA: Diagnosis not present

## 2020-01-11 DIAGNOSIS — M859 Disorder of bone density and structure, unspecified: Secondary | ICD-10-CM | POA: Diagnosis not present

## 2020-01-18 DIAGNOSIS — Z1331 Encounter for screening for depression: Secondary | ICD-10-CM | POA: Diagnosis not present

## 2020-01-18 DIAGNOSIS — E039 Hypothyroidism, unspecified: Secondary | ICD-10-CM | POA: Diagnosis not present

## 2020-01-18 DIAGNOSIS — E663 Overweight: Secondary | ICD-10-CM | POA: Diagnosis not present

## 2020-01-18 DIAGNOSIS — I701 Atherosclerosis of renal artery: Secondary | ICD-10-CM | POA: Diagnosis not present

## 2020-01-18 DIAGNOSIS — R7302 Impaired glucose tolerance (oral): Secondary | ICD-10-CM | POA: Diagnosis not present

## 2020-01-18 DIAGNOSIS — E7800 Pure hypercholesterolemia, unspecified: Secondary | ICD-10-CM | POA: Diagnosis not present

## 2020-01-18 DIAGNOSIS — I739 Peripheral vascular disease, unspecified: Secondary | ICD-10-CM | POA: Diagnosis not present

## 2020-01-18 DIAGNOSIS — Z0000 Encounter for general adult medical examination without abnormal findings: Secondary | ICD-10-CM | POA: Diagnosis not present

## 2020-01-18 DIAGNOSIS — I150 Renovascular hypertension: Secondary | ICD-10-CM | POA: Diagnosis not present

## 2020-01-18 DIAGNOSIS — D692 Other nonthrombocytopenic purpura: Secondary | ICD-10-CM | POA: Diagnosis not present

## 2020-01-18 DIAGNOSIS — M8580 Other specified disorders of bone density and structure, unspecified site: Secondary | ICD-10-CM | POA: Diagnosis not present

## 2020-01-18 DIAGNOSIS — I4891 Unspecified atrial fibrillation: Secondary | ICD-10-CM | POA: Diagnosis not present

## 2020-01-18 DIAGNOSIS — Z1339 Encounter for screening examination for other mental health and behavioral disorders: Secondary | ICD-10-CM | POA: Diagnosis not present

## 2020-02-06 ENCOUNTER — Ambulatory Visit: Payer: PPO | Attending: Internal Medicine

## 2020-02-06 DIAGNOSIS — Z23 Encounter for immunization: Secondary | ICD-10-CM

## 2020-02-06 NOTE — Progress Notes (Signed)
   Covid-19 Vaccination Clinic  Name:  Gabriela Moyer    MRN: [id number] DOB: [birth year]  02/06/2020  [name] was observed post Covid-19 immunization for 15 minutes without incidence. She was provided with Vaccine Information Sheet and instruction to access the V-Safe system.   [name] was instructed to call 911 with any severe reactions post vaccine: Marland Kitchen Difficulty breathing  . Swelling of your face and throat  . A fast heartbeat  . A bad rash all over your body  . Dizziness and weakness    Immunizations Administered    Name Date Dose VIS Date Route   Pfizer COVID-19 Vaccine 02/06/2020  5:13 PM 0.3 mL 12/10/2019 Intramuscular   Manufacturer: [employer], [employer]   Lot: [id number]   NDC: [address]-4

## 2020-02-16 ENCOUNTER — Ambulatory Visit: Payer: PPO | Admitting: Cardiovascular Disease

## 2020-02-16 ENCOUNTER — Other Ambulatory Visit: Payer: Self-pay

## 2020-02-16 ENCOUNTER — Encounter: Payer: Self-pay | Admitting: Cardiovascular Disease

## 2020-02-16 ENCOUNTER — Other Ambulatory Visit: Payer: Self-pay | Admitting: Cardiovascular Disease

## 2020-02-16 VITALS — BP 165/86 | HR 70 | Ht 62.0 in | Wt 167.8 lb

## 2020-02-16 DIAGNOSIS — R079 Chest pain, unspecified: Secondary | ICD-10-CM

## 2020-02-16 DIAGNOSIS — I701 Atherosclerosis of renal artery: Secondary | ICD-10-CM

## 2020-02-16 NOTE — Progress Notes (Signed)
02/16/2020 Gabriela Moyer   1941/08/18  [id number]  Primary Physician [name], Fransico Him, MD Primary Cardiologist: Lorretta Harp MD Lupe Carney, [place]  HPI:  Gabriela Moyer is a 79 y.o.  appearing Caucasian female patient of Dr. Evette Georges who I last saw in the office 01/05/2019. She does have a history of hyperlipidemia and hypertension as well as PAD.  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  I performed right renal artery stenting 07/18/2009.  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  She is on 3 antihypertensive medications but her blood pressures at home are well controlled.  Renal function is normal.  She saw Dr. Fletcher Anon in the office recently who evaluated her and felt that she was not a candidate for re-intervention given her lack of indications.  Since I saw her a year ago she is remained stable.  She did have an episode of "chest fluttering" and vague chest pain.  She underwent coronary calcium score 06/28/2019 which was 117.  She is had no recurrent symptoms.  Recent renal Doppler studies performed 11/05/2019 showed a renal aortic ratio 3.6 with stable renal dimensions.  Her blood pressures been well controlled at home on 3 antihypertensive medications.   Current Meds  Medication Sig  . clopidogrel (PLAVIX) 75 MG tablet TAKE 1 TABLET BY MOUTH DAILY  . diltiazem (CARDIZEM CD) 120 MG 24 hr capsule TAKE 1 CAPSULE(120 MG) BY MOUTH DAILY  . ezetimibe (ZETIA) 10 MG tablet Take 1 tablet (10 mg total) by mouth daily.  . flecainide (TAMBOCOR) 50 MG tablet Take 1 tablet (50 mg total) by mouth 2 (two) times daily as needed (AFIB AND INCREASED HR).  . hydrochlorothiazide (HYDRODIURIL) 25 MG tablet TAKE 1 TABLET(25 MG) BY MOUTH DAILY  . levothyroxine (SYNTHROID, LEVOTHROID) 25 MCG tablet Take 1 tablet by mouth daily.  .  metoprolol succinate (TOPROL-XL) 100 MG 24 hr tablet TAKE 1 TABLET BY MOUTH EVERY DAY WITH OR IMMEDIATELY AFTER A MEAL  . metoprolol tartrate (LOPRESSOR) 50 MG tablet Take 2 hours prior to procedure  . pravastatin (PRAVACHOL) 20 MG tablet Take 1 tablet (20 mg total) by mouth every evening.  . predniSONE (DELTASONE) 50 MG tablet Take 1 tablet (50 mg total) by mouth every 6 (six) hours. Take 50mg  every 6 hours for 3 doses prior to CT scan     Allergies  Allergen Reactions  . Codeine Nausea And Vomiting    Abdominal pain  . Contrast Media [Iodinated Diagnostic Agents] Nausea And Vomiting    Abdominal pain Caused low blood pressure  . Morphine Nausea And Vomiting    Abdominal pain    Social History   Socioeconomic History  . Marital status: Divorced    Spouse name: Not on file  . Number of children: 2  . Years of education: 42  . Highest education level: Not on file  Occupational History  . Not on file  Tobacco Use  . Smoking status: Passive Smoke Exposure - Never Smoker  . Smokeless tobacco: Never Used  Substance and Sexual Activity  . Alcohol use: No  . Drug use: Never  . Sexual activity: Not on file  Other Topics Concern  . Not on file  Social History Narrative  . Not on file   Social Determinants of Health   Financial Resource Strain:   . Difficulty  of Paying Living Expenses: Not on file  Food Insecurity:   . Worried About Programme researcher, broadcasting/film/video in the Last Year: Not on file  . Ran Out of Food in the Last Year: Not on file  Transportation Needs:   . Lack of Transportation (Medical): Not on file  . Lack of Transportation (Non-Medical): Not on file  Physical Activity:   . Days of Exercise per Week: Not on file  . Minutes of Exercise per Session: Not on file  Stress:   . Feeling of Stress : Not on file  Social Connections:   . Frequency of Communication with Friends and Family: Not on file  . Frequency of Social Gatherings with Friends and Family: Not on file  .  Attends Religious Services: Not on file  . Active Member of Clubs or Organizations: Not on file  . Attends Banker Meetings: Not on file  . Marital Status: Not on file  Intimate Partner Violence:   . Fear of Current or Ex-Partner: Not on file  . Emotionally Abused: Not on file  . Physically Abused: Not on file  . Sexually Abused: Not on file     Review of Systems: General: negative for chills, fever, night sweats or weight changes.  Cardiovascular: negative for chest pain, dyspnea on exertion, edema, orthopnea, palpitations, paroxysmal nocturnal dyspnea or shortness of breath Dermatological: negative for rash Respiratory: negative for cough or wheezing Urologic: negative for hematuria Abdominal: negative for nausea, vomiting, diarrhea, bright red blood per rectum, melena, or hematemesis Neurologic: negative for visual changes, syncope, or dizziness All other systems reviewed and are otherwise negative except as noted above.    Blood pressure (!) 165/86, pulse 70, height 5\' 2"  (1.575 m), weight 167 lb 12.8 oz (76.1 kg), SpO2 97 %.  General appearance: alert and no distress Neck: no adenopathy, no carotid bruit, no JVD, supple, symmetrical, trachea midline and thyroid not enlarged, symmetric, no tenderness/mass/nodules Lungs: clear to auscultation bilaterally Heart: regular rate and rhythm, S1, S2 normal, no murmur, click, rub or gallop Extremities: extremities normal, atraumatic, no cyanosis or edema Pulses: 2+ and symmetric Skin: Skin color, texture, turgor normal. No rashes or lesions Neurologic: Alert and oriented X 3, normal strength and tone. Normal symmetric reflexes. Normal coordination and gait  EKG sinus rhythm at 70 with septal Q waves.  I personally reviewed this EKG.  ASSESSMENT AND PLAN:   RENAL ARTERY STENOSIS History of renal artery stenosis status post renal artery stenting by myself 07/18/2009.  Recent renal Doppler studies show a right renal artery  velocity of 444 but the renal aortic ratio was 3.6 and the renal dimension of 9.7.  This represents no change from the prior renal Doppler study performed a year ago.  Her blood pressure at home is 125/65.  She does have some whitecoat hypertension.  She is on 3 antihypertensive medications including hydrochlorothiazide, metoprolol and diltiazem.  We will continue to follow her Doppler studies on annual basis.  Currently, there is no indication for reintervention.      07/20/2009 MD FACP,FACC,FAHA, [facility] 02/16/2020 11:02 AM

## 2020-02-16 NOTE — Patient Instructions (Signed)
Medication Instructions:  Your physician recommends that you continue on your current medications as directed. Please refer to the Current Medication list given to you today.  If you need a refill on your cardiac medications before your next appointment, please call your pharmacy.   Lab work: NONE  Testing/Procedures: Your physician has requested that you have a renal artery duplex in November 2021. During this test, an ultrasound is used to evaluate blood flow to the kidneys. Allow one hour for this exam. Do not eat after midnight the day before and avoid carbonated beverages. Take your medications as you usually do.   Follow-Up: At [facility], you and your health needs are our priority.  As part of our continuing mission to provide you with exceptional heart care, we have created designated Provider Care Teams.  These Care Teams include your primary Cardiologist (physician) and Advanced Practice Providers (APPs -  Physician Assistants and Nurse Practitioners) who all work together to provide you with the care you need, when you need it. You may see Nicki Guadalajara, MD or one of the following Advanced Practice Providers on your designated Care Team:    Corine Shelter, PA-C  [place], [place]  Edd Fabian, [place]  Your physician wants you to follow-up in: 3 months with Dr. Tresa Endo Your physician wants you to follow-up in: 1 year with Dr. Allyson Sabal

## 2020-02-16 NOTE — Assessment & Plan Note (Signed)
History of renal artery stenosis status post renal artery stenting by myself 07/18/2009.  Recent renal Doppler studies show a right renal artery velocity of 444 but the renal aortic ratio was 3.6 and the renal dimension of 9.7.  This represents no change from the prior renal Doppler study performed a year ago.  Her blood pressure at home is 125/65.  She does have some whitecoat hypertension.  She is on 3 antihypertensive medications including hydrochlorothiazide, metoprolol and diltiazem.  We will continue to follow her Doppler studies on annual basis.  Currently, there is no indication for reintervention.

## 2020-03-02 ENCOUNTER — Ambulatory Visit: Payer: PPO | Attending: Internal Medicine

## 2020-03-02 DIAGNOSIS — Z23 Encounter for immunization: Secondary | ICD-10-CM | POA: Insufficient documentation

## 2020-03-02 NOTE — Progress Notes (Signed)
   Covid-19 Vaccination Clinic  Name:  Gabriela Moyer    MRN: [id number] DOB: 05-Jan-1941  03/02/2020  Gabriela Moyer was observed post Covid-19 immunization for 15 minutes without incident. She was provided with Vaccine Information Sheet and instruction to access the V-Safe system.   Gabriela Moyer was instructed to call 911 with any severe reactions post vaccine: Marland Kitchen Difficulty breathing  . Swelling of face and throat  . A fast heartbeat  . A bad rash all over body  . Dizziness and weakness   Immunizations Administered    Name Date Dose VIS Date Route   Pfizer COVID-19 Vaccine 03/02/2020  2:03 PM 0.3 mL 12/10/2019 Intramuscular   Manufacturer: [employer], [employer]   Lot: [id number]   NDC: [address]-0

## 2020-03-24 ENCOUNTER — Other Ambulatory Visit: Payer: Self-pay | Admitting: Cardiovascular Disease

## 2020-05-23 ENCOUNTER — Other Ambulatory Visit: Payer: Self-pay | Admitting: Cardiovascular Disease

## 2020-06-05 ENCOUNTER — Ambulatory Visit: Payer: PPO | Admitting: Cardiovascular Disease

## 2020-06-05 ENCOUNTER — Encounter: Payer: Self-pay | Admitting: Cardiovascular Disease

## 2020-06-05 ENCOUNTER — Other Ambulatory Visit: Payer: Self-pay

## 2020-06-05 DIAGNOSIS — I739 Peripheral vascular disease, unspecified: Secondary | ICD-10-CM | POA: Diagnosis not present

## 2020-06-05 DIAGNOSIS — I10 Essential (primary) hypertension: Secondary | ICD-10-CM | POA: Diagnosis not present

## 2020-06-05 DIAGNOSIS — E039 Hypothyroidism, unspecified: Secondary | ICD-10-CM | POA: Diagnosis not present

## 2020-06-05 DIAGNOSIS — I701 Atherosclerosis of renal artery: Secondary | ICD-10-CM

## 2020-06-05 DIAGNOSIS — I480 Paroxysmal atrial fibrillation: Secondary | ICD-10-CM

## 2020-06-05 DIAGNOSIS — E785 Hyperlipidemia, unspecified: Secondary | ICD-10-CM

## 2020-06-05 MED ORDER — AMLODIPINE BESYLATE 5 MG PO TABS: 5.0000 mg | ORAL_TABLET | Freq: Every day | ORAL | 3 refills | Status: DC
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
## 2020-06-05 NOTE — Patient Instructions (Signed)
Medication Instructions:  STOP TAKING YOUR DILTIAZEM  BEGIN TAKING AMLODIPINE 5MG  DAILY  *If you need a refill on your cardiac medications before your next appointment, please call your pharmacy*   Follow-Up: At [facility], you and your health needs are our priority.  As part of our continuing mission to provide you with exceptional heart care, we have created designated Provider Care Teams.  These Care Teams include your primary Cardiologist (physician) and Advanced Practice Providers (APPs -  Physician Assistants and Nurse Practitioners) who all work together to provide you with the care you need, when you need it.  We recommend signing up for the patient portal called "MyChart".  Sign up information is provided on this After Visit Summary.  MyChart is used to connect with patients for Virtual Visits (Telemedicine).  Patients are able to view lab/test results, encounter notes, upcoming appointments, etc.  Non-urgent messages can be sent to your provider as well.   To learn more about what you can do with MyChart, go to [facility].    Your next appointment:   3-4 month(s)  The format for your next appointment:   In Person  Provider:   [email or web address], MD

## 2020-06-11 ENCOUNTER — Encounter: Payer: Self-pay | Admitting: Cardiovascular Disease

## 2020-08-02 DIAGNOSIS — E669 Obesity, unspecified: Secondary | ICD-10-CM | POA: Diagnosis not present

## 2020-08-02 DIAGNOSIS — E11319 Type 2 diabetes mellitus with unspecified diabetic retinopathy without macular edema: Secondary | ICD-10-CM | POA: Diagnosis not present

## 2020-08-02 DIAGNOSIS — Z6830 Body mass index (BMI) 30.0-30.9, adult: Secondary | ICD-10-CM | POA: Diagnosis not present

## 2020-08-02 DIAGNOSIS — I4821 Permanent atrial fibrillation: Secondary | ICD-10-CM | POA: Diagnosis not present

## 2020-08-02 DIAGNOSIS — R945 Abnormal results of liver function studies: Secondary | ICD-10-CM | POA: Diagnosis not present

## 2020-08-02 DIAGNOSIS — E7800 Pure hypercholesterolemia, unspecified: Secondary | ICD-10-CM | POA: Diagnosis not present

## 2020-08-02 DIAGNOSIS — I701 Atherosclerosis of renal artery: Secondary | ICD-10-CM | POA: Diagnosis not present

## 2020-08-02 DIAGNOSIS — I739 Peripheral vascular disease, unspecified: Secondary | ICD-10-CM | POA: Diagnosis not present

## 2020-08-02 DIAGNOSIS — Z7901 Long term (current) use of anticoagulants: Secondary | ICD-10-CM | POA: Diagnosis not present

## 2020-08-02 DIAGNOSIS — E039 Hypothyroidism, unspecified: Secondary | ICD-10-CM | POA: Diagnosis not present

## 2020-08-02 DIAGNOSIS — I150 Renovascular hypertension: Secondary | ICD-10-CM | POA: Diagnosis not present

## 2020-08-20 ENCOUNTER — Other Ambulatory Visit: Payer: Self-pay | Admitting: Cardiovascular Disease

## 2020-10-10 ENCOUNTER — Ambulatory Visit: Payer: PPO | Admitting: Cardiovascular Disease

## 2020-11-17 ENCOUNTER — Ambulatory Visit (HOSPITAL_COMMUNITY)
Admission: RE | Admit: 2020-11-17 | Discharge: 2020-11-17 | Disposition: A | Discharge status: Home / Self Care | Payer: PPO | Source: Ambulatory Visit | Attending: Cardiology | Admitting: Cardiology

## 2020-11-17 DIAGNOSIS — R079 Chest pain, unspecified: Secondary | ICD-10-CM | POA: Diagnosis not present

## 2020-11-17 DIAGNOSIS — I701 Atherosclerosis of renal artery: Secondary | ICD-10-CM

## 2020-12-15 ENCOUNTER — Other Ambulatory Visit: Payer: Self-pay | Admitting: Cardiovascular Disease

## 2020-12-19 ENCOUNTER — Other Ambulatory Visit: Payer: Self-pay

## 2020-12-19 MED ORDER — EZETIMIBE 10 MG PO TABS: 10.0000 mg | ORAL_TABLET | Freq: Every day | ORAL | 3 refills | Status: DC

## 2021-01-12 DIAGNOSIS — E11319 Type 2 diabetes mellitus with unspecified diabetic retinopathy without macular edema: Secondary | ICD-10-CM | POA: Diagnosis not present

## 2021-01-12 DIAGNOSIS — E039 Hypothyroidism, unspecified: Secondary | ICD-10-CM | POA: Diagnosis not present

## 2021-01-12 DIAGNOSIS — E7800 Pure hypercholesterolemia, unspecified: Secondary | ICD-10-CM | POA: Diagnosis not present

## 2021-01-19 DIAGNOSIS — Z1212 Encounter for screening for malignant neoplasm of rectum: Secondary | ICD-10-CM | POA: Diagnosis not present

## 2021-01-19 DIAGNOSIS — E039 Hypothyroidism, unspecified: Secondary | ICD-10-CM | POA: Diagnosis not present

## 2021-01-19 DIAGNOSIS — I739 Peripheral vascular disease, unspecified: Secondary | ICD-10-CM | POA: Diagnosis not present

## 2021-01-19 DIAGNOSIS — Z7901 Long term (current) use of anticoagulants: Secondary | ICD-10-CM | POA: Diagnosis not present

## 2021-01-19 DIAGNOSIS — I701 Atherosclerosis of renal artery: Secondary | ICD-10-CM | POA: Diagnosis not present

## 2021-01-19 DIAGNOSIS — M8580 Other specified disorders of bone density and structure, unspecified site: Secondary | ICD-10-CM | POA: Diagnosis not present

## 2021-01-19 DIAGNOSIS — E7800 Pure hypercholesterolemia, unspecified: Secondary | ICD-10-CM | POA: Diagnosis not present

## 2021-01-19 DIAGNOSIS — Z6830 Body mass index (BMI) 30.0-30.9, adult: Secondary | ICD-10-CM | POA: Diagnosis not present

## 2021-01-19 DIAGNOSIS — I4821 Permanent atrial fibrillation: Secondary | ICD-10-CM | POA: Diagnosis not present

## 2021-01-19 DIAGNOSIS — E669 Obesity, unspecified: Secondary | ICD-10-CM | POA: Diagnosis not present

## 2021-01-19 DIAGNOSIS — E11319 Type 2 diabetes mellitus with unspecified diabetic retinopathy without macular edema: Secondary | ICD-10-CM | POA: Diagnosis not present

## 2021-01-19 DIAGNOSIS — Z0000 Encounter for general adult medical examination without abnormal findings: Secondary | ICD-10-CM | POA: Diagnosis not present

## 2021-01-19 DIAGNOSIS — I150 Renovascular hypertension: Secondary | ICD-10-CM | POA: Diagnosis not present

## 2021-01-19 DIAGNOSIS — R945 Abnormal results of liver function studies: Secondary | ICD-10-CM | POA: Diagnosis not present

## 2021-02-08 ENCOUNTER — Other Ambulatory Visit: Payer: Self-pay

## 2021-02-08 ENCOUNTER — Encounter: Payer: Self-pay | Admitting: Cardiovascular Disease

## 2021-02-08 ENCOUNTER — Ambulatory Visit: Payer: PPO | Admitting: Cardiovascular Disease

## 2021-02-08 VITALS — BP 140/82 | HR 72 | Ht 62.0 in | Wt 176.2 lb

## 2021-02-08 DIAGNOSIS — E039 Hypothyroidism, unspecified: Secondary | ICD-10-CM | POA: Diagnosis not present

## 2021-02-08 DIAGNOSIS — I10 Essential (primary) hypertension: Secondary | ICD-10-CM

## 2021-02-08 DIAGNOSIS — I480 Paroxysmal atrial fibrillation: Secondary | ICD-10-CM

## 2021-02-08 DIAGNOSIS — I701 Atherosclerosis of renal artery: Secondary | ICD-10-CM | POA: Diagnosis not present

## 2021-02-08 DIAGNOSIS — E785 Hyperlipidemia, unspecified: Secondary | ICD-10-CM | POA: Diagnosis not present

## 2021-02-08 DIAGNOSIS — I739 Peripheral vascular disease, unspecified: Secondary | ICD-10-CM | POA: Diagnosis not present

## 2021-02-08 MED ORDER — PRAVASTATIN SODIUM 40 MG PO TABS
40.0000 mg | ORAL_TABLET | Freq: Every evening | ORAL | 3 refills | Status: DC
Start: 2021-02-08 — End: 2024-06-27
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
## 2021-02-08 NOTE — Patient Instructions (Signed)
Medication Instructions:  INCREASE YOUR PRAVASTATIN TO 40 MG DAILY OK TO TAKE 2 OF YOUR 20 MG DAILY UNTIL YOU RUN OUT   *If you need a refill on your cardiac medications before your next appointment, please call your pharmacy*  Lab Work: FASTING LP/CMET IN 6 MONTHS   If you have labs (blood work) drawn today and your tests are completely normal, you will receive your results only by: Marland Kitchen MyChart Message (if you have MyChart) OR . A paper copy in the mail If you have any lab test that is abnormal or we need to change your treatment, we will call you to review the results.  Testing/Procedures: NONE  Follow-Up: At [facility], you and your health needs are our priority.  As part of our continuing mission to provide you with exceptional heart care, we have created designated Provider Care Teams.  These Care Teams include your primary Cardiologist (physician) and Advanced Practice Providers (APPs -  Physician Assistants and Nurse Practitioners) who all work together to provide you with the care you need, when you need it.  We recommend signing up for the patient portal called "MyChart".  Sign up information is provided on this After Visit Summary.  MyChart is used to connect with patients for Virtual Visits (Telemedicine).  Patients are able to view lab/test results, encounter notes, upcoming appointments, etc.  Non-urgent messages can be sent to your provider as well.   To learn more about what you can do with MyChart, go to [email or web address].    Your next appointment:   12 month(s)  You will receive a reminder letter in the mail two months in advance. If you don't receive a letter, please call our office to schedule the follow-up appointment.  The format for your next appointment:   In Person  Provider:   You may see Nicki Guadalajara, MD or one of the following Advanced Practice Providers on your designated Care Team:    Azalee Course, PA-C  Micah Flesher, PA-C or   Judy Pimple,  [place]

## 2021-04-04 ENCOUNTER — Ambulatory Visit: Payer: PPO | Admitting: Cardiovascular Disease

## 2021-05-04 ENCOUNTER — Other Ambulatory Visit: Payer: Self-pay | Admitting: Cardiovascular Disease

## 2021-05-07 ENCOUNTER — Other Ambulatory Visit: Payer: Self-pay | Admitting: Cardiovascular Disease

## 2021-05-16 ENCOUNTER — Encounter: Payer: Self-pay | Admitting: Cardiovascular Disease

## 2021-05-16 ENCOUNTER — Ambulatory Visit: Payer: PPO | Admitting: Cardiovascular Disease

## 2021-05-16 ENCOUNTER — Other Ambulatory Visit: Payer: Self-pay

## 2021-05-16 DIAGNOSIS — I701 Atherosclerosis of renal artery: Secondary | ICD-10-CM

## 2021-05-16 NOTE — Patient Instructions (Signed)
Medication Instructions:  Your physician recommends that you continue on your current medications as directed. Please refer to the Current Medication list given to you today.  *If you need a refill on your cardiac medications before your next appointment, please call your pharmacy*  Testing/Procedures: Your physician has requested that you have a renal artery duplex. During this test, an ultrasound is used to evaluate blood flow to the kidneys. Allow one hour for this exam. Do not eat after midnight the day before and avoid carbonated beverages. Take your medications as you usually do.  This procedure is done at [address] [facility] 2nd Floor. To be done in November 2022.   Follow-Up: At [facility], you and your health needs are our priority.  As part of our continuing mission to provide you with exceptional heart care, we have created designated Provider Care Teams.  These Care Teams include your primary Cardiologist (physician) and Advanced Practice Providers (APPs -  Physician Assistants and Nurse Practitioners) who all work together to provide you with the care you need, when you need it.  We recommend signing up for the patient portal called "MyChart".  Sign up information is provided on this After Visit Summary.  MyChart is used to connect with patients for Virtual Visits (Telemedicine).  Patients are able to view lab/test results, encounter notes, upcoming appointments, etc.  Non-urgent messages can be sent to your provider as well.   To learn more about what you can do with MyChart, go to [email or web address].    Your next appointment:   12 month(s)  The format for your next appointment:   In Person  Provider:   Nanetta Batty, MD

## 2021-05-16 NOTE — Progress Notes (Signed)
05/16/2021 Gabriela Moyer   December 15, 1941  [id number]  Primary Physician [name], Adelfa Koh, MD Primary Cardiologist: Runell Gess MD Nicholes Calamity, [place]  HPI:  Gabriela Moyer is a 80 y.o.  appearing Caucasian female patient of Dr. Landry Dyke who I last saw in the office 02/16/2020.She does have a history of hyperlipidemia and hypertension as well as PAD. 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 I performed right renal artery stenting 07/18/2009. 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 She is on 3 antihypertensive medications but her blood pressures at home are well controlled. Renal function is normal. She saw Dr. Kirke Corin in the office recently who evaluated her and felt that she was not a candidate for re-intervention given her lack of indications.  Since I saw her a year ago she is remained stable.  She denies chest pain, shortness of breath or claudication.  Her blood pressures have been under good control on her current medications.  Renal Doppler performed 11/17/2020 did show a right renal aortic ratio of 3.9 with a velocity at the origin of the right renal artery 382 cm/s suggesting moderate in-stent restenosis however since her blood pressures under good control I do not feel compelled to intervene at this time.   Current Meds  Medication Sig  . clopidogrel (PLAVIX) 75 MG tablet TAKE 1 TABLET BY MOUTH DAILY  . ezetimibe (ZETIA) 10 MG tablet Take 1 tablet (10 mg total) by mouth daily.  Marland Kitchen ezetimibe (ZETIA) 10 MG tablet Take 1 tablet by mouth daily.  . hydrochlorothiazide (HYDRODIURIL) 25 MG tablet TAKE 1 TABLET(25 MG) BY MOUTH DAILY  . levothyroxine (SYNTHROID, LEVOTHROID) 25 MCG tablet Take 1 tablet by mouth daily.  . metoprolol succinate (TOPROL-XL) 100 MG 24 hr tablet TAKE 1 TABLET BY MOUTH EVERY DAY WITH OR  IMMEDIATELY AFTER A MEAL  . metoprolol tartrate (LOPRESSOR) 50 MG tablet Take 2 hours prior to procedure  . pravastatin (PRAVACHOL) 40 MG tablet Take 1 tablet (40 mg total) by mouth every evening.  . pravastatin (PRAVACHOL) 40 MG tablet Take 1 tablet by mouth daily.     Allergies  Allergen Reactions  . Codeine Nausea And Vomiting    Abdominal pain  . Contrast Media [Iodinated Diagnostic Agents] Nausea And Vomiting    Abdominal pain Caused low blood pressure  . Morphine Nausea And Vomiting    Abdominal pain    Social History   Socioeconomic History  . Marital status: Divorced    Spouse name: Not on file  . Number of children: 2  . Years of education: 24  . Highest education level: Not on file  Occupational History  . Not on file  Tobacco Use  . Smoking status: Passive Smoke Exposure - Never Smoker  . Smokeless tobacco: Never Used  Vaping Use  . Vaping Use: Never used  Substance and Sexual Activity  . Alcohol use: No  . Drug use: Never  . Sexual activity: Not on file  Other Topics Concern  . Not on file  Social History Narrative  . Not on file   Social Determinants of Health   Financial Resource Strain: Not on file  Food Insecurity: Not on file  Transportation Needs: Not on file  Physical Activity: Not on file  Stress: Not on file  Social Connections: Not on file  Intimate Partner Violence: Not  on file     Review of Systems: General: negative for chills, fever, night sweats or weight changes.  Cardiovascular: negative for chest pain, dyspnea on exertion, edema, orthopnea, palpitations, paroxysmal nocturnal dyspnea or shortness of breath Dermatological: negative for rash Respiratory: negative for cough or wheezing Urologic: negative for hematuria Abdominal: negative for nausea, vomiting, diarrhea, bright red blood per rectum, melena, or hematemesis Neurologic: negative for visual changes, syncope, or dizziness All other systems reviewed and are otherwise  negative except as noted above.    Blood pressure (!) 144/81, pulse 68, height 5\' 2"  (1.575 m), weight 17 lb 6.4 oz (7.893 kg), SpO2 94 %.  General appearance: alert and no distress Neck: no adenopathy, no carotid bruit, no JVD, supple, symmetrical, trachea midline and thyroid not enlarged, symmetric, no tenderness/mass/nodules Lungs: clear to auscultation bilaterally Heart: regular rate and rhythm, S1, S2 normal, no murmur, click, rub or gallop Extremities: extremities normal, atraumatic, no cyanosis or edema Pulses: 2+ and symmetric Skin: Skin color, texture, turgor normal. No rashes or lesions Neurologic: Alert and oriented X 3, normal strength and tone. Normal symmetric reflexes. Normal coordination and gait  EKG not performed today  ASSESSMENT AND PLAN:   RENAL ARTERY STENOSIS History of renal artery stenosis status post right renal artery stenting by myself 07/18/2009.  She is on hydrochlorothiazide, metoprolol and amlodipine for hypertension with a blood pressure measured today at 144/81 although usually is lower than this.  Renal Doppler studies performed 11/17/2020 revealed a right renal aortic ratio of 3.9 and ostial velocity of 382 cm/s suggesting moderate in-stent restenosis although currently I do not feel compelled to intervene.  We will continue to follow her on an annual basis.      11/19/2020 MD FACP,FACC,FAHA, [facility] 05/16/2021 11:40 AM

## 2021-05-16 NOTE — Assessment & Plan Note (Signed)
History of renal artery stenosis status post right renal artery stenting by myself 07/18/2009.  She is on hydrochlorothiazide, metoprolol and amlodipine for hypertension with a blood pressure measured today at 144/81 although usually is lower than this.  Renal Doppler studies performed 11/17/2020 revealed a right renal aortic ratio of 3.9 and ostial velocity of 382 cm/s suggesting moderate in-stent restenosis although currently I do not feel compelled to intervene.  We will continue to follow her on an annual basis.

## 2021-07-23 DIAGNOSIS — Z7901 Long term (current) use of anticoagulants: Secondary | ICD-10-CM | POA: Diagnosis not present

## 2021-07-23 DIAGNOSIS — I4821 Permanent atrial fibrillation: Secondary | ICD-10-CM | POA: Diagnosis not present

## 2021-07-23 DIAGNOSIS — E039 Hypothyroidism, unspecified: Secondary | ICD-10-CM | POA: Diagnosis not present

## 2021-07-23 DIAGNOSIS — M8580 Other specified disorders of bone density and structure, unspecified site: Secondary | ICD-10-CM | POA: Diagnosis not present

## 2021-07-23 DIAGNOSIS — I701 Atherosclerosis of renal artery: Secondary | ICD-10-CM | POA: Diagnosis not present

## 2021-07-23 DIAGNOSIS — Z6830 Body mass index (BMI) 30.0-30.9, adult: Secondary | ICD-10-CM | POA: Diagnosis not present

## 2021-07-23 DIAGNOSIS — R945 Abnormal results of liver function studies: Secondary | ICD-10-CM | POA: Diagnosis not present

## 2021-07-23 DIAGNOSIS — D692 Other nonthrombocytopenic purpura: Secondary | ICD-10-CM | POA: Diagnosis not present

## 2021-07-23 DIAGNOSIS — E11319 Type 2 diabetes mellitus with unspecified diabetic retinopathy without macular edema: Secondary | ICD-10-CM | POA: Diagnosis not present

## 2021-07-23 DIAGNOSIS — I150 Renovascular hypertension: Secondary | ICD-10-CM | POA: Diagnosis not present

## 2021-07-23 DIAGNOSIS — I739 Peripheral vascular disease, unspecified: Secondary | ICD-10-CM | POA: Diagnosis not present

## 2021-07-23 DIAGNOSIS — E669 Obesity, unspecified: Secondary | ICD-10-CM | POA: Diagnosis not present

## 2021-08-23 ENCOUNTER — Other Ambulatory Visit: Payer: Self-pay | Admitting: Cardiovascular Disease

## 2021-11-23 DIAGNOSIS — I10 Essential (primary) hypertension: Secondary | ICD-10-CM | POA: Diagnosis not present

## 2021-11-23 DIAGNOSIS — N3000 Acute cystitis without hematuria: Secondary | ICD-10-CM | POA: Diagnosis not present

## 2021-12-12 ENCOUNTER — Other Ambulatory Visit (HOSPITAL_COMMUNITY): Payer: Self-pay | Admitting: Cardiovascular Disease

## 2021-12-12 ENCOUNTER — Other Ambulatory Visit: Payer: Self-pay

## 2021-12-12 ENCOUNTER — Ambulatory Visit (HOSPITAL_COMMUNITY)
Admission: RE | Admit: 2021-12-12 | Discharge: 2021-12-12 | Disposition: A | Discharge status: Home / Self Care | Payer: PPO | Source: Ambulatory Visit | Attending: Cardiology | Admitting: Cardiology

## 2021-12-12 DIAGNOSIS — I701 Atherosclerosis of renal artery: Secondary | ICD-10-CM | POA: Insufficient documentation

## 2021-12-12 DIAGNOSIS — Z9889 Other specified postprocedural states: Secondary | ICD-10-CM

## 2021-12-14 ENCOUNTER — Other Ambulatory Visit: Payer: Self-pay | Admitting: Cardiovascular Disease

## 2022-01-14 DIAGNOSIS — E11319 Type 2 diabetes mellitus with unspecified diabetic retinopathy without macular edema: Secondary | ICD-10-CM | POA: Diagnosis not present

## 2022-01-14 DIAGNOSIS — M859 Disorder of bone density and structure, unspecified: Secondary | ICD-10-CM | POA: Diagnosis not present

## 2022-01-14 DIAGNOSIS — M8589 Other specified disorders of bone density and structure, multiple sites: Secondary | ICD-10-CM | POA: Diagnosis not present

## 2022-01-14 DIAGNOSIS — E039 Hypothyroidism, unspecified: Secondary | ICD-10-CM | POA: Diagnosis not present

## 2022-01-21 DIAGNOSIS — Z7901 Long term (current) use of anticoagulants: Secondary | ICD-10-CM | POA: Diagnosis not present

## 2022-01-21 DIAGNOSIS — E11319 Type 2 diabetes mellitus with unspecified diabetic retinopathy without macular edema: Secondary | ICD-10-CM | POA: Diagnosis not present

## 2022-01-21 DIAGNOSIS — E7800 Pure hypercholesterolemia, unspecified: Secondary | ICD-10-CM | POA: Diagnosis not present

## 2022-01-21 DIAGNOSIS — R82998 Other abnormal findings in urine: Secondary | ICD-10-CM | POA: Diagnosis not present

## 2022-01-21 DIAGNOSIS — E669 Obesity, unspecified: Secondary | ICD-10-CM | POA: Diagnosis not present

## 2022-01-21 DIAGNOSIS — I701 Atherosclerosis of renal artery: Secondary | ICD-10-CM | POA: Diagnosis not present

## 2022-01-21 DIAGNOSIS — H259 Unspecified age-related cataract: Secondary | ICD-10-CM | POA: Diagnosis not present

## 2022-01-21 DIAGNOSIS — I150 Renovascular hypertension: Secondary | ICD-10-CM | POA: Diagnosis not present

## 2022-01-21 DIAGNOSIS — E039 Hypothyroidism, unspecified: Secondary | ICD-10-CM | POA: Diagnosis not present

## 2022-01-21 DIAGNOSIS — I4821 Permanent atrial fibrillation: Secondary | ICD-10-CM | POA: Diagnosis not present

## 2022-01-21 DIAGNOSIS — M8580 Other specified disorders of bone density and structure, unspecified site: Secondary | ICD-10-CM | POA: Diagnosis not present

## 2022-01-21 DIAGNOSIS — Z1331 Encounter for screening for depression: Secondary | ICD-10-CM | POA: Diagnosis not present

## 2022-01-21 DIAGNOSIS — Z0000 Encounter for general adult medical examination without abnormal findings: Secondary | ICD-10-CM | POA: Diagnosis not present

## 2022-01-21 DIAGNOSIS — I739 Peripheral vascular disease, unspecified: Secondary | ICD-10-CM | POA: Diagnosis not present

## 2022-01-21 DIAGNOSIS — Z1339 Encounter for screening examination for other mental health and behavioral disorders: Secondary | ICD-10-CM | POA: Diagnosis not present

## 2022-05-02 ENCOUNTER — Other Ambulatory Visit: Payer: Self-pay | Admitting: Cardiovascular Disease

## 2022-05-09 ENCOUNTER — Other Ambulatory Visit: Payer: Self-pay | Admitting: Cardiovascular Disease

## 2022-06-13 ENCOUNTER — Encounter: Payer: Self-pay | Admitting: Physician Assistant

## 2022-06-13 ENCOUNTER — Ambulatory Visit: Payer: PPO | Admitting: Physician Assistant

## 2022-06-13 VITALS — BP 136/72 | HR 74 | Ht 62.0 in

## 2022-06-13 DIAGNOSIS — I480 Paroxysmal atrial fibrillation: Secondary | ICD-10-CM

## 2022-06-13 DIAGNOSIS — E785 Hyperlipidemia, unspecified: Secondary | ICD-10-CM | POA: Diagnosis not present

## 2022-06-13 DIAGNOSIS — I701 Atherosclerosis of renal artery: Secondary | ICD-10-CM | POA: Diagnosis not present

## 2022-06-13 DIAGNOSIS — R0989 Other specified symptoms and signs involving the circulatory and respiratory systems: Secondary | ICD-10-CM

## 2022-06-13 DIAGNOSIS — I10 Essential (primary) hypertension: Secondary | ICD-10-CM | POA: Diagnosis not present

## 2022-06-13 NOTE — Patient Instructions (Signed)
Medication Instructions:  No Changes *If you need a refill on your cardiac medications before your next appointment, please call your pharmacy*   Lab Work: No Labs If you have labs (blood work) drawn today and your tests are completely normal, you will receive your results only by: MyChart Message (if you have MyChart) OR A paper copy in the mail If you have any lab test that is abnormal or we need to change your treatment, we will call you to review the results.   Testing/Procedures:[address], [address]. Your physician has requested that you have a carotid duplex. This test is an ultrasound of the carotid arteries in your neck. It looks at blood flow through these arteries that supply the brain with blood. Allow one hour for this exam. There are no restrictions or special instructions.    Follow-Up: At [facility], you and your health needs are our priority.  As part of our continuing mission to provide you with exceptional heart care, we have created designated Provider Care Teams.  These Care Teams include your primary Cardiologist (physician) and Advanced Practice Providers (APPs -  Physician Assistants and Nurse Practitioners) who all work together to provide you with the care you need, when you need it.  We recommend signing up for the patient portal called "MyChart".  Sign up information is provided on this After Visit Summary.  MyChart is used to connect with patients for Virtual Visits (Telemedicine).  Patients are able to view lab/test results, encounter notes, upcoming appointments, etc.  Non-urgent messages can be sent to your provider as well.   To learn more about what you can do with MyChart, go to [email or web address].    Your next appointment:   1 year(s)  The format for your next appointment:   In Person  Provider:   Nicki Guadalajara, MD     Other Instructions Bring Blood Pressure Log to follow up visit with Dr. Allyson Sabal.  Important Information About  Sugar

## 2022-06-13 NOTE — Progress Notes (Signed)
Cardiology Office Note:    Date:  06/13/2022   ID:  Gabriela Moyer, DOB 11-08-1941, MRN [id number]  PCP:  Gaspar Garbe, MD  [facility] Cardiologist: Nicki Guadalajara, MD   Reason for visit: 1 year follow-up  History of Present Illness:    Gabriela Moyer is a 81 y.o. female with a hx of HTN, hyperlipidemia, diffuse PAD diffuse peripheral vascular disease involving her subclavian, axillary, brachial vessels and renal arteries s/p right renal artery stenting 2010 with in-stent restenosis, hypothyroidism, paroxysmal atrial fibrillation.  With Xarelto and Plavix, she had bloody stools.  Xarelto discontinued.  Today, she states she feels well.  She lives alone and does all of her house chores by herself.  She has not had palpitations for at least 3 years.  She states every time she goes into A-fib she called 911.  She does complain about having COVID around Christmas time.  She did not need hospitalization.  She is still regaining her sense of taste.  No residual shortness of breath.  No chest pain.  Her blood pressure is slightly high today.  Normally at home it is 120s over 60s.  She does state her cholesterol in January was higher than usual which she blames on stress prior to this check/ "falling off the bandwagon."  She had a daughter who had quadruple bypass who is now doing well.  Now that her stress is down, she is back to a healthier lifestyle.  We discussed her elevated LDL, she states previously Crestor was $300 so she was put on pravastatin as a cheaper option.  She tolerates pravastatin and Zetia well.  She is not interested in changing medications at this time.  She continues on Plavix monotherapy with no bleeding.    Past Medical History:  Diagnosis Date   Carotid arterial disease (HCC)    a. 08/2016 Carotid U/S: 1-39% bilat ICA stenosis, >50 R ECA stenosis.   Diastolic dysfunction    a. 11/2016 Echo: EF 65-70%, Gr1 DD.   History of stress test    a. 12/2016 Lexiscan MV: EF  73%, no ischemia/infarct.   Hyperlipidemia    Hypertension    PAF (paroxysmal atrial fibrillation) (HCC)    a. 11/2016 AF RVR in ED-->converted on IV dilt-->CHA2DS2VASc = 4-->Xarelto.   Renal artery stenosis (HCC)    a. 06/2009 s/p R Renal Artery PTA/stenting; b. 08/2016 Renal Duplex: nl LRA, >60% RRA stenosis- f/u 1 yr.   Subclavian arterial stenosis (HCC)    a. 08/2014 Upper Ext Duplex: R distal [place] & Ax - 70-99% diam reduction, L distal [place] & Ax - 50-69%--stable.    Past Surgical History:  Procedure Laterality Date   CARDIAC CATHETERIZATION  11/03/2007   atherosclerotic right renal artery (Dr. Evlyn Courier)   Carotid Doppler  12/2010   R & L ICA with small amount fibrous plaque; R distal subclav. 70-99% diameter reduction; L mid subclav 50-69% diameter reduction   Lower Extremity Arterial Doppler  03/2008   right ABI - mild arterial insuff at rest; R CIA with less than 50% diameter reduction, R & L SFA with less than 50% diameter reduction   NM MYOCAR PERF WALL MOTION  09/2009   bruce myoview - normal perfusion, EF 915, low risk scan   RENAL ANGIOPLASTY  11/07/2009   5x46mm Cordis Genesis Aviator stent to ostium of right renal artery (Dr. Erlene Quan)   Renal Artery Doppler  04/2012   celiac artery with >50% diameter reduction; R renal  artery stent w/mildly elevated velocities (1-59% diameter reduction)   TRANSTHORACIC ECHOCARDIOGRAM  08/28/2009   EF=>55%; trace TR; mild AV regurg, mild pulm valve regurg   Upper Extremity Arterial Doppler  04/2012   right brachial pressure , left ; R distal subclavian & axillary arteries 70-99% diameter reduction; L distal subclavian/axillary & brachial arteries (50-69% diameter reduction)    Current Medications: Current Meds  Medication Sig   amLODipine (NORVASC) 5 MG tablet TAKE 1 TABLET BY MOUTH DAILY   clopidogrel (PLAVIX) 75 MG tablet TAKE 1 TABLET BY MOUTH DAILY   ezetimibe (ZETIA) 10 MG tablet Take 1 tablet by mouth daily.    hydrochlorothiazide (HYDRODIURIL) 25 MG tablet TAKE 1 TABLET(25 MG) BY MOUTH DAILY   levothyroxine (SYNTHROID, LEVOTHROID) 25 MCG tablet Take 1 tablet by mouth daily.   metoprolol succinate (TOPROL-XL) 100 MG 24 hr tablet TAKE 1 TABLET BY MOUTH EVERY DAY WITH OR IMMEDIATELY AFTER A MEAL   pravastatin (PRAVACHOL) 40 MG tablet Take 1 tablet (40 mg total) by mouth every evening.   pravastatin (PRAVACHOL) 40 MG tablet Take 1 tablet by mouth daily.     Allergies:   Codeine, Contrast media [iodinated contrast media], and Morphine   Social History   Socioeconomic History   Marital status: Divorced    Spouse name: Not on file   Number of children: 2   Years of education: 12   Highest education level: Not on file  Occupational History   Not on file  Tobacco Use   Smoking status: Never    Passive exposure: Yes   Smokeless tobacco: Never  Vaping Use   Vaping Use: Never used  Substance and Sexual Activity   Alcohol use: No   Drug use: Never   Sexual activity: Not on file  Other Topics Concern   Not on file  Social History Narrative   Not on file   Social Determinants of Health   Financial Resource Strain: Not on file  Food Insecurity: Not on file  Transportation Needs: Not on file  Physical Activity: Not on file  Stress: Not on file  Social Connections: Not on file     Family History: The patient's family history includes Cancer in her brother and father; Heart Problems in her maternal grandfather and maternal grandmother; Heart disease in her mother; Stroke in her mother.  ROS:   Please see the history of present illness.     EKGs/Labs/Other Studies Reviewed:    EKG:  The ekg ordered today demonstrates normal sinus rhythm, heart rate 74.  Recent Labs: No results found for requested labs within last 365 days.   Recent Lipid Panel Lab Results  Component Value Date/Time   CHOL 191 04/14/2018 09:03 AM   TRIG 98 04/14/2018 09:03 AM   HDL 68 04/14/2018 09:03 AM   LDLCALC  103 (H) 04/14/2018 09:03 AM    Physical Exam:    VS:  BP 136/72   Pulse 74   Ht 5\' 2"  (1.575 m)   SpO2 93%   BMI 3.18 kg/m    No data found.  Wt Readings from Last 3 Encounters:  05/16/21 17 lb 6.4 oz (7.893 kg)  02/08/21 176 lb 3.2 oz (79.9 kg)  06/05/20 168 lb (76.2 kg)     GEN:  Well nourished, well developed in no acute distress, looks younger than stated age. HEENT: Normal NECK: No JVD; right carotid bruit CARDIAC: RRR, no murmurs, rubs, gallops RESPIRATORY:  Clear to auscultation without rales, wheezing or rhonchi  ABDOMEN: Soft, non-tender, non-distended MUSCULOSKELETAL: No edema; No deformity  SKIN: Warm and dry NEUROLOGIC:  Alert and oriented PSYCHIATRIC:  Normal affect    ASSESSMENT AND PLAN   Hypertension, well controlled -Continue amlodipine and HCTZ.  Normal potassium January 2003. -Goal BP is <130/80.  Recommend DASH diet (high in vegetables, fruits, low-fat dairy products, whole grains, poultry, fish, and nuts and low in sweets, sugar-sweetened beverages, and red meats), salt restriction and increase physical activity.  Hyperlipidemia with goal LDL less than 70 -Lipids January 2023 with total cholesterol 204, HDL 53, LDL 126 and triglycerides 784.  Checked by her PCP. -States LDL higher secondary to stress.  She is back to healthier lifestyle and believes lipids have improved. -Discussed changing pravastatin to Crestor as cost is better now.  She states that her age, she will just continue her current therapy.    Paroxysmal atrial fibrillation, in NSR today -No palpitations in the past year. -Continue metoprolol for rate control.  Heart rate 74 today. -Off anticoagulation given history of bleeding.  Continues on Plavix for history of PAD.  Renal artery stenosis -Right artery stenting in 2010 with Dr. Allyson Sabal; renal Dopplers 2021 with moderate in-stent restenosis.   -With controlled blood pressure, this is clinically monitored.  Asked to bring blood  pressure log to follow-up with Dr. Allyson Sabal next month.  Disposition - Follow-up with Dr. Tresa Endo in 1 year.   Medication Adjustments/Labs and Tests Ordered: Current medicines are reviewed at length with the patient today.  Concerns regarding medicines are outlined above.  Orders Placed This Encounter  Procedures   EKG 12-Lead   VAS US CAROTID   No orders of the defined types were placed in this encounter.   Patient Instructions  Medication Instructions:  No Changes *If you need a refill on your cardiac medications before your next appointment, please call your pharmacy*   Lab Work: No Labs If you have labs (blood work) drawn today and your tests are completely normal, you will receive your results only by: MyChart Message (if you have MyChart) OR A paper copy in the mail If you have any lab test that is abnormal or we need to change your treatment, we will call you to review the results.   Testing/Procedures:[address], [address]. Your physician has requested that you have a carotid duplex. This test is an ultrasound of the carotid arteries in your neck. It looks at blood flow through these arteries that supply the brain with blood. Allow one hour for this exam. There are no restrictions or special instructions.    Follow-Up: At [facility], you and your health needs are our priority.  As part of our continuing mission to provide you with exceptional heart care, we have created designated Provider Care Teams.  These Care Teams include your primary Cardiologist (physician) and Advanced Practice Providers (APPs -  Physician Assistants and Nurse Practitioners) who all work together to provide you with the care you need, when you need it.  We recommend signing up for the patient portal called "MyChart".  Sign up information is provided on this After Visit Summary.  MyChart is used to connect with patients for Virtual Visits (Telemedicine).  Patients are able to view lab/test  results, encounter notes, upcoming appointments, etc.  Non-urgent messages can be sent to your provider as well.   To learn more about what you can do with MyChart, go to [email or web address].    Your next appointment:   1 year(s)  The format  for your next appointment:   In Person  Provider:   Nicki Guadalajara, MD     Other Instructions Bring Blood Pressure Log to follow up visit with Dr. Allyson Sabal.  Important Information About Sugar         Signed, Bernette Mayers  06/13/2022 9:39 AM    [facility]

## 2022-06-18 ENCOUNTER — Ambulatory Visit (HOSPITAL_COMMUNITY)
Admission: RE | Admit: 2022-06-18 | Discharge: 2022-06-18 | Disposition: A | Discharge status: Home / Self Care | Payer: PPO | Source: Ambulatory Visit | Attending: Cardiovascular Disease | Admitting: Cardiovascular Disease

## 2022-06-18 DIAGNOSIS — I701 Atherosclerosis of renal artery: Secondary | ICD-10-CM | POA: Diagnosis not present

## 2022-06-18 DIAGNOSIS — I480 Paroxysmal atrial fibrillation: Secondary | ICD-10-CM | POA: Diagnosis not present

## 2022-06-18 DIAGNOSIS — R0989 Other specified symptoms and signs involving the circulatory and respiratory systems: Secondary | ICD-10-CM | POA: Insufficient documentation

## 2022-06-18 DIAGNOSIS — I10 Essential (primary) hypertension: Secondary | ICD-10-CM | POA: Diagnosis not present

## 2022-06-18 DIAGNOSIS — E785 Hyperlipidemia, unspecified: Secondary | ICD-10-CM | POA: Diagnosis not present

## 2022-06-27 ENCOUNTER — Telehealth: Payer: Self-pay

## 2022-06-27 NOTE — Telephone Encounter (Addendum)
Called patient regarding results. Patient had understanding of results.----- Message from Cannon Kettle, PA-C sent at 06/26/2022  2:42 PM EDT ----- No carotid artery stenosis.  Known right distal subclavian/axillary stenosis. No further treatment needed.

## 2022-07-03 ENCOUNTER — Ambulatory Visit: Payer: PPO | Admitting: Cardiovascular Disease

## 2022-07-03 ENCOUNTER — Encounter: Payer: Self-pay | Admitting: Cardiovascular Disease

## 2022-07-03 VITALS — BP 124/81 | HR 73 | Ht 62.0 in | Wt 171.0 lb

## 2022-07-03 DIAGNOSIS — I701 Atherosclerosis of renal artery: Secondary | ICD-10-CM

## 2022-07-03 DIAGNOSIS — E785 Hyperlipidemia, unspecified: Secondary | ICD-10-CM

## 2022-07-03 DIAGNOSIS — I10 Essential (primary) hypertension: Secondary | ICD-10-CM | POA: Diagnosis not present

## 2022-07-03 NOTE — Assessment & Plan Note (Signed)
History of essential hypertension blood pressure measured today at 124/81 in the left arm.  She is on amlodipine, hydrochlorothiazide and metoprolol.

## 2022-07-03 NOTE — Assessment & Plan Note (Signed)
History of renal artery stenosis status post right renal artery stenting by myself 07/18/2009.  Her blood pressures have been under good control on 3 medications.  Her renal Dopplers have remained stable with velocities in the 400 range and her renal dimensions have remained stable as well.  We will continue to follow her noninvasively.  At this point, there is no indication for reintervention.

## 2022-07-03 NOTE — Patient Instructions (Signed)
Medication Instructions:  Your physician recommends that you continue on your current medications as directed. Please refer to the Current Medication list given to you today.  *If you need a refill on your cardiac medications before your next appointment, please call your pharmacy*   Lab Work: Your physician recommends that you have labs drawn today: lipid/liver panel  If you have labs (blood work) drawn today and your tests are completely normal, you will receive your results only by: MyChart Message (if you have MyChart) OR A paper copy in the mail If you have any lab test that is abnormal or we need to change your treatment, we will call you to review the results.   Testing/Procedures: Your physician has requested that you have a renal artery duplex. During this test, an ultrasound is used to evaluate blood flow to the kidneys. Allow one hour for this exam. Do not eat after midnight the day before and avoid carbonated beverages. Take your medications as you usually do. To be done in December. This procedure will be done at [address]    Follow-Up: At [facility], you and your health needs are our priority.  As part of our continuing mission to provide you with exceptional heart care, we have created designated Provider Care Teams.  These Care Teams include your primary Cardiologist (physician) and Advanced Practice Providers (APPs -  Physician Assistants and Nurse Practitioners) who all work together to provide you with the care you need, when you need it.  We recommend signing up for the patient portal called "MyChart".  Sign up information is provided on this After Visit Summary.  MyChart is used to connect with patients for Virtual Visits (Telemedicine).  Patients are able to view lab/test results, encounter notes, upcoming appointments, etc.  Non-urgent messages can be sent to your provider as well.   To learn more about what you can do with MyChart, go to  [email or web address].    Your next appointment:   We will see you on an as needed basis.  Provider:   Nanetta Batty, MD

## 2022-07-03 NOTE — Progress Notes (Signed)
07/03/2022 VOULA WALN   [birth year]  [id number]  Primary Physician [name], Adelfa Koh, MD Primary Cardiologist: Runell Gess MD Nicholes Calamity, [place]  HPI:  Gabriela Moyer is a 81 y.o.  appearing Caucasian female patient of Dr. Landry Dyke who I last saw in the office 05/16/2021. She does have a history of hyperlipidemia and hypertension as well as PAD.  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  I performed right renal artery stenting 07/18/2009.  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  She is on 3 antihypertensive medications but her blood pressures at home are well controlled.  Renal function is normal.  She saw Dr. Kirke Corin in the office recently who evaluated her and felt that she was not a candidate for re-intervention given her lack of indications.   Since I saw her a year ago she is remained stable.  She denies chest pain, shortness of breath or claudication.  Her blood pressures have been under good control on her current medications.  Renal Doppler performed 12/12/2021 revealed proximal right renal artery velocities in the 400 range.  Her renal dimensions have remained stable.  Given her good blood pressure control, there is currently no indication for reintervention on her right renal artery.   Current Meds  Medication Sig   amLODipine (NORVASC) 5 MG tablet TAKE 1 TABLET BY MOUTH DAILY   clopidogrel (PLAVIX) 75 MG tablet TAKE 1 TABLET BY MOUTH DAILY   ezetimibe (ZETIA) 10 MG tablet Take 1 tablet by mouth daily.   hydrochlorothiazide (HYDRODIURIL) 25 MG tablet TAKE 1 TABLET(25 MG) BY MOUTH DAILY   levothyroxine (SYNTHROID, LEVOTHROID) 25 MCG tablet Take 1 tablet by mouth daily.   metoprolol succinate (TOPROL-XL) 100 MG 24 hr tablet TAKE 1 TABLET BY MOUTH EVERY DAY WITH OR IMMEDIATELY AFTER A MEAL   metoprolol  tartrate (LOPRESSOR) 50 MG tablet Take 2 hours prior to procedure   pravastatin (PRAVACHOL) 40 MG tablet Take 1 tablet (40 mg total) by mouth every evening.   [DISCONTINUED] pravastatin (PRAVACHOL) 40 MG tablet Take 1 tablet by mouth daily.     Allergies  Allergen Reactions   Codeine Nausea And Vomiting    Abdominal pain   Contrast Media [Iodinated Contrast Media] Nausea And Vomiting    Abdominal pain Caused low blood pressure   Morphine Nausea And Vomiting    Abdominal pain    Social History   Socioeconomic History   Marital status: Divorced    Spouse name: Not on file   Number of children: 2   Years of education: 12   Highest education level: Not on file  Occupational History   Not on file  Tobacco Use   Smoking status: Never    Passive exposure: Yes   Smokeless tobacco: Never  Vaping Use   Vaping Use: Never used  Substance and Sexual Activity   Alcohol use: No   Drug use: Never   Sexual activity: Not on file  Other Topics Concern   Not on file  Social History Narrative   Not on file   Social Determinants of Health   Financial Resource Strain: Not on file  Food Insecurity: Not on file  Transportation Needs: Not on file  Physical Activity: Not on file  Stress: Not on file  Social Connections: Not on file  Intimate Partner Violence: Not on file  Review of Systems: General: negative for chills, fever, night sweats or weight changes.  Cardiovascular: negative for chest pain, dyspnea on exertion, edema, orthopnea, palpitations, paroxysmal nocturnal dyspnea or shortness of breath Dermatological: negative for rash Respiratory: negative for cough or wheezing Urologic: negative for hematuria Abdominal: negative for nausea, vomiting, diarrhea, bright red blood per rectum, melena, or hematemesis Neurologic: negative for visual changes, syncope, or dizziness All other systems reviewed and are otherwise negative except as noted above.    Blood pressure 124/81,  pulse 73, height 5\' 2"  (1.575 m), weight 171 lb (77.6 kg), SpO2 97 %.  General appearance: alert and no distress Neck: no adenopathy, no carotid bruit, no JVD, supple, symmetrical, trachea midline, and thyroid not enlarged, symmetric, no tenderness/mass/nodules Lungs: clear to auscultation bilaterally Heart: regular rate and rhythm, S1, S2 normal, no murmur, click, rub or gallop Extremities: extremities normal, atraumatic, no cyanosis or edema Pulses: 2+ and symmetric Skin: Skin color, texture, turgor normal. No rashes or lesions Neurologic: Grossly normal  EKG not performed today  ASSESSMENT AND PLAN:   Essential hypertension History of essential hypertension blood pressure measured today at 124/81 in the left arm.  She is on amlodipine, hydrochlorothiazide and metoprolol.  RENAL ARTERY STENOSIS History of renal artery stenosis status post right renal artery stenting by myself 07/18/2009.  Her blood pressures have been under good control on 3 medications.  Her renal Dopplers have remained stable with velocities in the 400 range and her renal dimensions have remained stable as well.  We will continue to follow her noninvasively.  At this point, there is no indication for reintervention.     07/20/2009 MD FACP,FACC,FAHA, [facility] 07/03/2022 9:42 AM

## 2022-07-04 LAB — HEPATIC FUNCTION PANEL
ALT: 20 IU/L (ref 0–32)
AST: 24 IU/L (ref 0–40)
Albumin: 4.6 g/dL (ref 3.6–4.6)
Alkaline Phosphatase: 84 IU/L (ref 44–121)
Bilirubin Total: 0.5 mg/dL (ref 0.0–1.2)
Bilirubin, Direct: 0.14 mg/dL (ref 0.00–0.40)
Total Protein: 6.8 g/dL (ref 6.0–8.5)

## 2022-07-04 LAB — LIPID PANEL
Chol/HDL Ratio: 2.8 ratio (ref 0.0–4.4)
Cholesterol, Total: 162 mg/dL (ref 100–199)
HDL: 57 mg/dL (ref >39)
LDL Chol Calc (NIH): 76 mg/dL (ref 0–99)
Triglycerides: 174 mg/dL — ABNORMAL HIGH (ref 0–149)
VLDL Cholesterol Cal: 29 mg/dL (ref 5–40)

## 2022-07-09 ENCOUNTER — Other Ambulatory Visit: Payer: Self-pay | Admitting: Cardiovascular Disease

## 2022-07-22 DIAGNOSIS — I739 Peripheral vascular disease, unspecified: Secondary | ICD-10-CM | POA: Diagnosis not present

## 2022-07-22 DIAGNOSIS — I4821 Permanent atrial fibrillation: Secondary | ICD-10-CM | POA: Diagnosis not present

## 2022-07-22 DIAGNOSIS — I701 Atherosclerosis of renal artery: Secondary | ICD-10-CM | POA: Diagnosis not present

## 2022-07-22 DIAGNOSIS — E039 Hypothyroidism, unspecified: Secondary | ICD-10-CM | POA: Diagnosis not present

## 2022-07-22 DIAGNOSIS — E11319 Type 2 diabetes mellitus with unspecified diabetic retinopathy without macular edema: Secondary | ICD-10-CM | POA: Diagnosis not present

## 2022-07-22 DIAGNOSIS — E7800 Pure hypercholesterolemia, unspecified: Secondary | ICD-10-CM | POA: Diagnosis not present

## 2022-07-22 DIAGNOSIS — E669 Obesity, unspecified: Secondary | ICD-10-CM | POA: Diagnosis not present

## 2022-07-22 DIAGNOSIS — I150 Renovascular hypertension: Secondary | ICD-10-CM | POA: Diagnosis not present

## 2022-07-22 DIAGNOSIS — M8580 Other specified disorders of bone density and structure, unspecified site: Secondary | ICD-10-CM | POA: Diagnosis not present

## 2022-07-22 DIAGNOSIS — D692 Other nonthrombocytopenic purpura: Secondary | ICD-10-CM | POA: Diagnosis not present

## 2022-07-22 DIAGNOSIS — Z7901 Long term (current) use of anticoagulants: Secondary | ICD-10-CM | POA: Diagnosis not present

## 2022-07-22 DIAGNOSIS — R945 Abnormal results of liver function studies: Secondary | ICD-10-CM | POA: Diagnosis not present

## 2022-11-11 ENCOUNTER — Other Ambulatory Visit: Payer: Self-pay | Admitting: Cardiovascular Disease

## 2022-12-10 ENCOUNTER — Other Ambulatory Visit (HOSPITAL_COMMUNITY): Payer: Self-pay | Admitting: Cardiovascular Disease

## 2022-12-10 DIAGNOSIS — I701 Atherosclerosis of renal artery: Secondary | ICD-10-CM

## 2022-12-13 ENCOUNTER — Inpatient Hospital Stay (HOSPITAL_COMMUNITY): Admission: RE | Admit: 2022-12-13 | Payer: PPO | Source: Ambulatory Visit

## 2022-12-14 ENCOUNTER — Other Ambulatory Visit: Payer: Self-pay | Admitting: Cardiovascular Disease

## 2022-12-15 ENCOUNTER — Other Ambulatory Visit: Payer: Self-pay | Admitting: Cardiovascular Disease

## 2023-01-10 ENCOUNTER — Encounter (HOSPITAL_COMMUNITY): Payer: PPO

## 2023-01-23 DIAGNOSIS — E7800 Pure hypercholesterolemia, unspecified: Secondary | ICD-10-CM | POA: Diagnosis not present

## 2023-01-23 DIAGNOSIS — E039 Hypothyroidism, unspecified: Secondary | ICD-10-CM | POA: Diagnosis not present

## 2023-01-23 DIAGNOSIS — R7989 Other specified abnormal findings of blood chemistry: Secondary | ICD-10-CM | POA: Diagnosis not present

## 2023-01-23 DIAGNOSIS — E11319 Type 2 diabetes mellitus with unspecified diabetic retinopathy without macular edema: Secondary | ICD-10-CM | POA: Diagnosis not present

## 2023-01-30 DIAGNOSIS — R945 Abnormal results of liver function studies: Secondary | ICD-10-CM | POA: Diagnosis not present

## 2023-01-30 DIAGNOSIS — E7800 Pure hypercholesterolemia, unspecified: Secondary | ICD-10-CM | POA: Diagnosis not present

## 2023-01-30 DIAGNOSIS — D6869 Other thrombophilia: Secondary | ICD-10-CM | POA: Diagnosis not present

## 2023-01-30 DIAGNOSIS — M8580 Other specified disorders of bone density and structure, unspecified site: Secondary | ICD-10-CM | POA: Diagnosis not present

## 2023-01-30 DIAGNOSIS — I739 Peripheral vascular disease, unspecified: Secondary | ICD-10-CM | POA: Diagnosis not present

## 2023-01-30 DIAGNOSIS — R82998 Other abnormal findings in urine: Secondary | ICD-10-CM | POA: Diagnosis not present

## 2023-01-30 DIAGNOSIS — Z0000 Encounter for general adult medical examination without abnormal findings: Secondary | ICD-10-CM | POA: Diagnosis not present

## 2023-01-30 DIAGNOSIS — I4821 Permanent atrial fibrillation: Secondary | ICD-10-CM | POA: Diagnosis not present

## 2023-01-30 DIAGNOSIS — D692 Other nonthrombocytopenic purpura: Secondary | ICD-10-CM | POA: Diagnosis not present

## 2023-01-30 DIAGNOSIS — E11319 Type 2 diabetes mellitus with unspecified diabetic retinopathy without macular edema: Secondary | ICD-10-CM | POA: Diagnosis not present

## 2023-01-30 DIAGNOSIS — I701 Atherosclerosis of renal artery: Secondary | ICD-10-CM | POA: Diagnosis not present

## 2023-01-30 DIAGNOSIS — Z7901 Long term (current) use of anticoagulants: Secondary | ICD-10-CM | POA: Diagnosis not present

## 2023-01-30 DIAGNOSIS — I150 Renovascular hypertension: Secondary | ICD-10-CM | POA: Diagnosis not present

## 2023-02-03 ENCOUNTER — Ambulatory Visit (HOSPITAL_COMMUNITY)
Admission: RE | Admit: 2023-02-03 | Discharge: 2023-02-03 | Disposition: A | Discharge status: Home / Self Care | Payer: PPO | Source: Ambulatory Visit | Attending: Cardiology | Admitting: Cardiology

## 2023-02-03 DIAGNOSIS — I701 Atherosclerosis of renal artery: Secondary | ICD-10-CM | POA: Diagnosis not present

## 2023-05-14 ENCOUNTER — Other Ambulatory Visit: Payer: Self-pay | Admitting: Cardiovascular Disease

## 2023-06-25 ENCOUNTER — Other Ambulatory Visit: Payer: Self-pay | Admitting: Cardiovascular Disease

## 2023-06-30 NOTE — Progress Notes (Unsigned)
  Cardiology Office Note   Date:  07/02/2023  ID:  GARIELLE BLAKE, DOB Nov 28, 1941, MRN [id number] PCP:  Gaspar Garbe, MD [facility] Cardiologist: Nicki Guadalajara, MD  Reason for visit: 1 year follow-up  History of Present Illness    Gabriela Moyer is a 82 y.o. female with a hx of HTN, hyperlipidemia, diffuse PAD diffuse peripheral vascular disease involving her subclavian, axillary, brachial vessels and renal arteries s/p right renal artery stenting 2010 with in-stent restenosis, hypothyroidism, paroxysmal atrial fibrillation.  With Xarelto and Plavix, she had bloody stools.  Xarelto discontinued.   I last saw her in June 2023.  She was doing well without palpitations.  Today, she states she is doing super well for her age.  She lives alone.  She states she has started getting some meals from a long life meal prep in [place].  She states with diet changes alone, her LDL has improved from 126 in January 2023 to 74 in January 2024.  She states since our last visit, she is only had 1 episode of A-fib with palpitations.  She states her sister and her husband were arguing which significantly stressed her.  Symptoms lasted less than 24 hours.  Otherwise, she denies chest pain, shortness of breath, PND, orthopnea, significant lower extremity edema and lightheadedness/syncope.  No bleeding issues on Plavix 75 mg daily.  Hypertension, well controlled -Continue amlodipine and HCTZ.   -Goal BP is <130/80.  Recommend DASH diet (high in vegetables, fruits, low-fat dairy products, whole grains, poultry, fish, and nuts and low in sweets, sugar-sweetened beverages, and red meats), salt restriction and increase physical activity.   Objective / Physical Exam   EKG today: Normal sinus rhythm, LVH, heart rate 84  Vital signs:  BP 112/68   Pulse 84   Ht 5' 2.5" (1.588 m)   Wt 163 lb 3.2 oz (74 kg)   SpO2 98%   BMI 29.37 kg/m     GEN: No acute distress NECK: No carotid bruits CARDIAC: RRR,  no murmurs RESPIRATORY:  Clear to auscultation without rales, wheezing or rhonchi  EXTREMITIES: No edema  Assessment and Plan   Hyperlipidemia with goal LDL less than 70 -LDL 74 in January 2024 (improved from 126 with better eating habits).  Continue pravastatin 40 mg daily and Zetia 10 mg daily.   Paroxysmal atrial fibrillation, in NSR today -Only 1 episode of palpitations in the past year lasting less than 24 hours. -Continue metoprolol for rate control.   -Off anticoagulation given history of bleeding.  Continues on Plavix for history of PAD.   Renal artery stenosis -Right artery stenting in 2010 with Dr. Allyson Sabal; renal Dopplers 01/2023 with moderate in-stent restenosis.  Dr. Allyson Sabal recommended repeat duplex in 1 year -With controlled blood pressure, this is clinically monitored.   -For her PAD, she is on long term Plavix monotherapy.   Disposition - Follow-up with 1 year with Dr. Tresa Endo or myself.   Signed, Cannon Kettle, PA-C  07/02/2023 [facility]

## 2023-07-02 ENCOUNTER — Encounter: Payer: Self-pay | Admitting: Physician Assistant

## 2023-07-02 ENCOUNTER — Ambulatory Visit: Payer: PPO | Attending: Physician Assistant | Admitting: Physician Assistant

## 2023-07-02 VITALS — BP 112/68 | HR 84 | Ht 62.5 in | Wt 163.2 lb

## 2023-07-02 DIAGNOSIS — I480 Paroxysmal atrial fibrillation: Secondary | ICD-10-CM | POA: Diagnosis not present

## 2023-07-02 DIAGNOSIS — E785 Hyperlipidemia, unspecified: Secondary | ICD-10-CM | POA: Diagnosis not present

## 2023-07-02 DIAGNOSIS — I10 Essential (primary) hypertension: Secondary | ICD-10-CM

## 2023-07-02 NOTE — Patient Instructions (Signed)
Medication Instructions:  Your physician recommends that you continue on your current medications as directed. Please refer to the Current Medication list given to you today.  *If you need a refill on your cardiac medications before your next appointment, please call your pharmacy*   Lab Work: None   Testing/Procedures: None   Follow-Up: At [facility], you and your health needs are our priority.  As part of our continuing mission to provide you with exceptional heart care, we have created designated Provider Care Teams.  These Care Teams include your primary Cardiologist (physician) and Advanced Practice Providers (APPs -  Physician Assistants and Nurse Practitioners) who all work together to provide you with the care you need, when you need it.   Your next appointment:   1 year(s)  Provider:   Juanda Crumble, PA-C    Then, Nicki Guadalajara, MD will plan to see you again in 1 year(s).

## 2023-07-28 DIAGNOSIS — E11319 Type 2 diabetes mellitus with unspecified diabetic retinopathy without macular edema: Secondary | ICD-10-CM | POA: Diagnosis not present

## 2023-07-28 DIAGNOSIS — I4821 Permanent atrial fibrillation: Secondary | ICD-10-CM | POA: Diagnosis not present

## 2023-07-28 DIAGNOSIS — Z7901 Long term (current) use of anticoagulants: Secondary | ICD-10-CM | POA: Diagnosis not present

## 2023-07-28 DIAGNOSIS — R945 Abnormal results of liver function studies: Secondary | ICD-10-CM | POA: Diagnosis not present

## 2023-07-28 DIAGNOSIS — E7800 Pure hypercholesterolemia, unspecified: Secondary | ICD-10-CM | POA: Diagnosis not present

## 2023-07-28 DIAGNOSIS — I701 Atherosclerosis of renal artery: Secondary | ICD-10-CM | POA: Diagnosis not present

## 2023-07-28 DIAGNOSIS — I739 Peripheral vascular disease, unspecified: Secondary | ICD-10-CM | POA: Diagnosis not present

## 2023-07-28 DIAGNOSIS — I150 Renovascular hypertension: Secondary | ICD-10-CM | POA: Diagnosis not present

## 2023-07-28 DIAGNOSIS — D692 Other nonthrombocytopenic purpura: Secondary | ICD-10-CM | POA: Diagnosis not present

## 2023-07-28 DIAGNOSIS — E039 Hypothyroidism, unspecified: Secondary | ICD-10-CM | POA: Diagnosis not present

## 2023-07-28 DIAGNOSIS — D6869 Other thrombophilia: Secondary | ICD-10-CM | POA: Diagnosis not present

## 2023-07-28 DIAGNOSIS — E663 Overweight: Secondary | ICD-10-CM | POA: Diagnosis not present

## 2023-12-30 ENCOUNTER — Other Ambulatory Visit: Payer: Self-pay | Admitting: Cardiovascular Disease

## 2024-01-28 DIAGNOSIS — I150 Renovascular hypertension: Secondary | ICD-10-CM | POA: Diagnosis not present

## 2024-01-28 DIAGNOSIS — M8580 Other specified disorders of bone density and structure, unspecified site: Secondary | ICD-10-CM | POA: Diagnosis not present

## 2024-01-28 DIAGNOSIS — E781 Pure hyperglyceridemia: Secondary | ICD-10-CM | POA: Diagnosis not present

## 2024-01-28 DIAGNOSIS — E11319 Type 2 diabetes mellitus with unspecified diabetic retinopathy without macular edema: Secondary | ICD-10-CM | POA: Diagnosis not present

## 2024-01-28 DIAGNOSIS — E039 Hypothyroidism, unspecified: Secondary | ICD-10-CM | POA: Diagnosis not present

## 2024-01-28 DIAGNOSIS — E7800 Pure hypercholesterolemia, unspecified: Secondary | ICD-10-CM | POA: Diagnosis not present

## 2024-02-04 DIAGNOSIS — I150 Renovascular hypertension: Secondary | ICD-10-CM | POA: Diagnosis not present

## 2024-02-04 DIAGNOSIS — I701 Atherosclerosis of renal artery: Secondary | ICD-10-CM | POA: Diagnosis not present

## 2024-02-04 DIAGNOSIS — D692 Other nonthrombocytopenic purpura: Secondary | ICD-10-CM | POA: Diagnosis not present

## 2024-02-04 DIAGNOSIS — Z1339 Encounter for screening examination for other mental health and behavioral disorders: Secondary | ICD-10-CM | POA: Diagnosis not present

## 2024-02-04 DIAGNOSIS — E11319 Type 2 diabetes mellitus with unspecified diabetic retinopathy without macular edema: Secondary | ICD-10-CM | POA: Diagnosis not present

## 2024-02-04 DIAGNOSIS — H259 Unspecified age-related cataract: Secondary | ICD-10-CM | POA: Diagnosis not present

## 2024-02-04 DIAGNOSIS — Z0000 Encounter for general adult medical examination without abnormal findings: Secondary | ICD-10-CM | POA: Diagnosis not present

## 2024-02-04 DIAGNOSIS — E7800 Pure hypercholesterolemia, unspecified: Secondary | ICD-10-CM | POA: Diagnosis not present

## 2024-02-04 DIAGNOSIS — E663 Overweight: Secondary | ICD-10-CM | POA: Diagnosis not present

## 2024-02-04 DIAGNOSIS — I4821 Permanent atrial fibrillation: Secondary | ICD-10-CM | POA: Diagnosis not present

## 2024-02-04 DIAGNOSIS — E039 Hypothyroidism, unspecified: Secondary | ICD-10-CM | POA: Diagnosis not present

## 2024-02-04 DIAGNOSIS — Z1331 Encounter for screening for depression: Secondary | ICD-10-CM | POA: Diagnosis not present

## 2024-02-04 DIAGNOSIS — I739 Peripheral vascular disease, unspecified: Secondary | ICD-10-CM | POA: Diagnosis not present

## 2024-02-04 DIAGNOSIS — R82998 Other abnormal findings in urine: Secondary | ICD-10-CM | POA: Diagnosis not present

## 2024-02-04 DIAGNOSIS — D6869 Other thrombophilia: Secondary | ICD-10-CM | POA: Diagnosis not present

## 2024-02-04 DIAGNOSIS — E1136 Type 2 diabetes mellitus with diabetic cataract: Secondary | ICD-10-CM | POA: Diagnosis not present

## 2024-02-12 ENCOUNTER — Other Ambulatory Visit: Payer: Self-pay | Admitting: Cardiovascular Disease

## 2024-02-18 ENCOUNTER — Other Ambulatory Visit: Payer: Self-pay | Admitting: Cardiovascular Disease

## 2024-06-06 ENCOUNTER — Emergency Department (HOSPITAL_COMMUNITY)

## 2024-06-06 ENCOUNTER — Encounter (HOSPITAL_COMMUNITY): Payer: Self-pay | Admitting: Internal Medicine

## 2024-06-06 ENCOUNTER — Inpatient Hospital Stay (HOSPITAL_COMMUNITY)
Admission: EM | Admit: 2024-06-06 | Discharge: 2024-06-27 | DRG: 023 | Disposition: A | Discharge status: Hospice | Attending: Internal Medicine | Admitting: Internal Medicine

## 2024-06-06 ENCOUNTER — Other Ambulatory Visit: Payer: Self-pay

## 2024-06-06 DIAGNOSIS — R110 Nausea: Secondary | ICD-10-CM | POA: Diagnosis present

## 2024-06-06 DIAGNOSIS — I701 Atherosclerosis of renal artery: Secondary | ICD-10-CM | POA: Diagnosis present

## 2024-06-06 DIAGNOSIS — E039 Hypothyroidism, unspecified: Secondary | ICD-10-CM | POA: Diagnosis present

## 2024-06-06 DIAGNOSIS — G40901 Epilepsy, unspecified, not intractable, with status epilepticus: Secondary | ICD-10-CM | POA: Diagnosis present

## 2024-06-06 DIAGNOSIS — I4891 Unspecified atrial fibrillation: Secondary | ICD-10-CM | POA: Diagnosis not present

## 2024-06-06 DIAGNOSIS — I4719 Other supraventricular tachycardia: Secondary | ICD-10-CM | POA: Diagnosis present

## 2024-06-06 DIAGNOSIS — B961 Klebsiella pneumoniae [K. pneumoniae] as the cause of diseases classified elsewhere: Secondary | ICD-10-CM | POA: Diagnosis not present

## 2024-06-06 DIAGNOSIS — N1831 Chronic kidney disease, stage 3a: Secondary | ICD-10-CM | POA: Diagnosis present

## 2024-06-06 DIAGNOSIS — Z09 Encounter for follow-up examination after completed treatment for conditions other than malignant neoplasm: Secondary | ICD-10-CM | POA: Diagnosis not present

## 2024-06-06 DIAGNOSIS — I2510 Atherosclerotic heart disease of native coronary artery without angina pectoris: Secondary | ICD-10-CM | POA: Diagnosis present

## 2024-06-06 DIAGNOSIS — I63531 Cerebral infarction due to unspecified occlusion or stenosis of right posterior cerebral artery: Principal | ICD-10-CM | POA: Diagnosis present

## 2024-06-06 DIAGNOSIS — I739 Peripheral vascular disease, unspecified: Secondary | ICD-10-CM | POA: Diagnosis not present

## 2024-06-06 DIAGNOSIS — Z7902 Long term (current) use of antithrombotics/antiplatelets: Secondary | ICD-10-CM

## 2024-06-06 DIAGNOSIS — I129 Hypertensive chronic kidney disease with stage 1 through stage 4 chronic kidney disease, or unspecified chronic kidney disease: Secondary | ICD-10-CM | POA: Diagnosis present

## 2024-06-06 DIAGNOSIS — E876 Hypokalemia: Secondary | ICD-10-CM | POA: Diagnosis present

## 2024-06-06 DIAGNOSIS — N200 Calculus of kidney: Secondary | ICD-10-CM | POA: Diagnosis not present

## 2024-06-06 DIAGNOSIS — Z515 Encounter for palliative care: Secondary | ICD-10-CM

## 2024-06-06 DIAGNOSIS — R519 Headache, unspecified: Secondary | ICD-10-CM

## 2024-06-06 DIAGNOSIS — G43909 Migraine, unspecified, not intractable, without status migrainosus: Secondary | ICD-10-CM | POA: Diagnosis not present

## 2024-06-06 DIAGNOSIS — R001 Bradycardia, unspecified: Secondary | ICD-10-CM | POA: Diagnosis present

## 2024-06-06 DIAGNOSIS — I6350 Cerebral infarction due to unspecified occlusion or stenosis of unspecified cerebral artery: Secondary | ICD-10-CM | POA: Diagnosis not present

## 2024-06-06 DIAGNOSIS — Z888 Allergy status to other drugs, medicaments and biological substances status: Secondary | ICD-10-CM

## 2024-06-06 DIAGNOSIS — Z9889 Other specified postprocedural states: Secondary | ICD-10-CM

## 2024-06-06 DIAGNOSIS — E1151 Type 2 diabetes mellitus with diabetic peripheral angiopathy without gangrene: Secondary | ICD-10-CM | POA: Diagnosis present

## 2024-06-06 DIAGNOSIS — N390 Urinary tract infection, site not specified: Secondary | ICD-10-CM | POA: Diagnosis not present

## 2024-06-06 DIAGNOSIS — G9349 Other encephalopathy: Secondary | ICD-10-CM | POA: Diagnosis not present

## 2024-06-06 DIAGNOSIS — Z781 Physical restraint status: Secondary | ICD-10-CM

## 2024-06-06 DIAGNOSIS — K5730 Diverticulosis of large intestine without perforation or abscess without bleeding: Secondary | ICD-10-CM | POA: Diagnosis not present

## 2024-06-06 DIAGNOSIS — R569 Unspecified convulsions: Secondary | ICD-10-CM | POA: Diagnosis not present

## 2024-06-06 DIAGNOSIS — E861 Hypovolemia: Secondary | ICD-10-CM

## 2024-06-06 DIAGNOSIS — G9341 Metabolic encephalopathy: Secondary | ICD-10-CM | POA: Diagnosis present

## 2024-06-06 DIAGNOSIS — K7689 Other specified diseases of liver: Secondary | ICD-10-CM | POA: Diagnosis not present

## 2024-06-06 DIAGNOSIS — R401 Stupor: Secondary | ICD-10-CM | POA: Diagnosis present

## 2024-06-06 DIAGNOSIS — R9402 Abnormal brain scan: Secondary | ICD-10-CM | POA: Diagnosis not present

## 2024-06-06 DIAGNOSIS — N179 Acute kidney failure, unspecified: Secondary | ICD-10-CM | POA: Diagnosis not present

## 2024-06-06 DIAGNOSIS — Z8679 Personal history of other diseases of the circulatory system: Secondary | ICD-10-CM | POA: Diagnosis not present

## 2024-06-06 DIAGNOSIS — Z8249 Family history of ischemic heart disease and other diseases of the circulatory system: Secondary | ICD-10-CM

## 2024-06-06 DIAGNOSIS — M5134 Other intervertebral disc degeneration, thoracic region: Secondary | ICD-10-CM | POA: Diagnosis not present

## 2024-06-06 DIAGNOSIS — G43009 Migraine without aura, not intractable, without status migrainosus: Secondary | ICD-10-CM | POA: Diagnosis not present

## 2024-06-06 DIAGNOSIS — Z604 Social exclusion and rejection: Secondary | ICD-10-CM | POA: Diagnosis present

## 2024-06-06 DIAGNOSIS — E785 Hyperlipidemia, unspecified: Secondary | ICD-10-CM | POA: Diagnosis present

## 2024-06-06 DIAGNOSIS — C8389 Other non-follicular lymphoma, extranodal and solid organ sites: Secondary | ICD-10-CM | POA: Diagnosis present

## 2024-06-06 DIAGNOSIS — Z8669 Personal history of other diseases of the nervous system and sense organs: Secondary | ICD-10-CM

## 2024-06-06 DIAGNOSIS — E1122 Type 2 diabetes mellitus with diabetic chronic kidney disease: Secondary | ICD-10-CM | POA: Diagnosis present

## 2024-06-06 DIAGNOSIS — I10 Essential (primary) hypertension: Secondary | ICD-10-CM | POA: Diagnosis not present

## 2024-06-06 DIAGNOSIS — H547 Unspecified visual loss: Secondary | ICD-10-CM | POA: Diagnosis present

## 2024-06-06 DIAGNOSIS — H538 Other visual disturbances: Secondary | ICD-10-CM | POA: Diagnosis not present

## 2024-06-06 DIAGNOSIS — C83398 Diffuse large b-cell lymphoma of other extranodal and solid organ sites: Secondary | ICD-10-CM | POA: Diagnosis not present

## 2024-06-06 DIAGNOSIS — G40909 Epilepsy, unspecified, not intractable, without status epilepticus: Secondary | ICD-10-CM | POA: Diagnosis not present

## 2024-06-06 DIAGNOSIS — Z885 Allergy status to narcotic agent status: Secondary | ICD-10-CM

## 2024-06-06 DIAGNOSIS — H53462 Homonymous bilateral field defects, left side: Secondary | ICD-10-CM | POA: Diagnosis present

## 2024-06-06 DIAGNOSIS — R29702 NIHSS score 2: Secondary | ICD-10-CM | POA: Diagnosis present

## 2024-06-06 DIAGNOSIS — Z79890 Hormone replacement therapy (postmenopausal): Secondary | ICD-10-CM

## 2024-06-06 DIAGNOSIS — I6523 Occlusion and stenosis of bilateral carotid arteries: Secondary | ICD-10-CM | POA: Diagnosis not present

## 2024-06-06 DIAGNOSIS — Z8719 Personal history of other diseases of the digestive system: Secondary | ICD-10-CM

## 2024-06-06 DIAGNOSIS — E7849 Other hyperlipidemia: Secondary | ICD-10-CM

## 2024-06-06 DIAGNOSIS — M4802 Spinal stenosis, cervical region: Secondary | ICD-10-CM | POA: Diagnosis not present

## 2024-06-06 DIAGNOSIS — M5126 Other intervertebral disc displacement, lumbar region: Secondary | ICD-10-CM | POA: Diagnosis not present

## 2024-06-06 DIAGNOSIS — H5340 Unspecified visual field defects: Secondary | ICD-10-CM | POA: Diagnosis present

## 2024-06-06 DIAGNOSIS — G9340 Encephalopathy, unspecified: Secondary | ICD-10-CM | POA: Diagnosis not present

## 2024-06-06 DIAGNOSIS — M5021 Other cervical disc displacement,  high cervical region: Secondary | ICD-10-CM | POA: Diagnosis not present

## 2024-06-06 DIAGNOSIS — M47814 Spondylosis without myelopathy or radiculopathy, thoracic region: Secondary | ICD-10-CM | POA: Diagnosis not present

## 2024-06-06 DIAGNOSIS — M47816 Spondylosis without myelopathy or radiculopathy, lumbar region: Secondary | ICD-10-CM | POA: Diagnosis not present

## 2024-06-06 DIAGNOSIS — M47812 Spondylosis without myelopathy or radiculopathy, cervical region: Secondary | ICD-10-CM | POA: Diagnosis not present

## 2024-06-06 DIAGNOSIS — G43109 Migraine with aura, not intractable, without status migrainosus: Secondary | ICD-10-CM | POA: Diagnosis not present

## 2024-06-06 DIAGNOSIS — Z79899 Other long term (current) drug therapy: Secondary | ICD-10-CM

## 2024-06-06 DIAGNOSIS — Z66 Do not resuscitate: Secondary | ICD-10-CM | POA: Diagnosis not present

## 2024-06-06 DIAGNOSIS — M48061 Spinal stenosis, lumbar region without neurogenic claudication: Secondary | ICD-10-CM | POA: Diagnosis not present

## 2024-06-06 DIAGNOSIS — G9389 Other specified disorders of brain: Secondary | ICD-10-CM | POA: Diagnosis not present

## 2024-06-06 DIAGNOSIS — I480 Paroxysmal atrial fibrillation: Secondary | ICD-10-CM | POA: Diagnosis not present

## 2024-06-06 DIAGNOSIS — I951 Orthostatic hypotension: Secondary | ICD-10-CM | POA: Diagnosis present

## 2024-06-06 DIAGNOSIS — R000 Tachycardia, unspecified: Secondary | ICD-10-CM | POA: Diagnosis present

## 2024-06-06 DIAGNOSIS — C7932 Secondary malignant neoplasm of cerebral meninges: Secondary | ICD-10-CM | POA: Diagnosis present

## 2024-06-06 DIAGNOSIS — I639 Cerebral infarction, unspecified: Principal | ICD-10-CM

## 2024-06-06 DIAGNOSIS — I6389 Other cerebral infarction: Secondary | ICD-10-CM | POA: Diagnosis not present

## 2024-06-06 DIAGNOSIS — R9089 Other abnormal findings on diagnostic imaging of central nervous system: Secondary | ICD-10-CM | POA: Diagnosis not present

## 2024-06-06 DIAGNOSIS — M5023 Other cervical disc displacement, cervicothoracic region: Secondary | ICD-10-CM | POA: Diagnosis not present

## 2024-06-06 DIAGNOSIS — C8309 Small cell B-cell lymphoma, extranodal and solid organ sites: Secondary | ICD-10-CM | POA: Diagnosis present

## 2024-06-06 DIAGNOSIS — D496 Neoplasm of unspecified behavior of brain: Secondary | ICD-10-CM | POA: Diagnosis not present

## 2024-06-06 DIAGNOSIS — Z823 Family history of stroke: Secondary | ICD-10-CM

## 2024-06-06 DIAGNOSIS — M419 Scoliosis, unspecified: Secondary | ICD-10-CM | POA: Diagnosis not present

## 2024-06-06 DIAGNOSIS — R414 Neurologic neglect syndrome: Secondary | ICD-10-CM | POA: Diagnosis not present

## 2024-06-06 DIAGNOSIS — G936 Cerebral edema: Secondary | ICD-10-CM | POA: Diagnosis not present

## 2024-06-06 DIAGNOSIS — Z91041 Radiographic dye allergy status: Secondary | ICD-10-CM

## 2024-06-06 DIAGNOSIS — F05 Delirium due to known physiological condition: Secondary | ICD-10-CM | POA: Diagnosis not present

## 2024-06-06 DIAGNOSIS — R4182 Altered mental status, unspecified: Secondary | ICD-10-CM | POA: Diagnosis not present

## 2024-06-06 DIAGNOSIS — G939 Disorder of brain, unspecified: Secondary | ICD-10-CM | POA: Diagnosis not present

## 2024-06-06 DIAGNOSIS — G4089 Other seizures: Secondary | ICD-10-CM | POA: Diagnosis not present

## 2024-06-06 DIAGNOSIS — I776 Arteritis, unspecified: Secondary | ICD-10-CM | POA: Diagnosis not present

## 2024-06-06 DIAGNOSIS — Z955 Presence of coronary angioplasty implant and graft: Secondary | ICD-10-CM

## 2024-06-06 DIAGNOSIS — Z7189 Other specified counseling: Secondary | ICD-10-CM | POA: Diagnosis not present

## 2024-06-06 DIAGNOSIS — G039 Meningitis, unspecified: Secondary | ICD-10-CM | POA: Diagnosis not present

## 2024-06-06 DIAGNOSIS — R42 Dizziness and giddiness: Secondary | ICD-10-CM | POA: Diagnosis not present

## 2024-06-06 DIAGNOSIS — R410 Disorientation, unspecified: Secondary | ICD-10-CM | POA: Diagnosis not present

## 2024-06-06 LAB — COMPREHENSIVE METABOLIC PANEL WITH GFR
ALT: 13 U/L (ref 0–44)
AST: 22 U/L (ref 15–41)
Albumin: 3.7 g/dL (ref 3.5–5.0)
Alkaline Phosphatase: 58 U/L (ref 38–126)
Anion gap: 13 (ref 5–15)
BUN: 22 mg/dL (ref 8–23)
CO2: 29 mmol/L (ref 22–32)
Calcium: 10.1 mg/dL (ref 8.9–10.3)
Chloride: 96 mmol/L — ABNORMAL LOW (ref 98–111)
Creatinine, Ser: 1.08 mg/dL — ABNORMAL HIGH (ref 0.44–1.00)
GFR, Estimated: 51 mL/min — ABNORMAL LOW (ref >60)
Glucose, Bld: 129 mg/dL — ABNORMAL HIGH (ref 70–99)
Potassium: 3.3 mmol/L — ABNORMAL LOW (ref 3.5–5.1)
Sodium: 138 mmol/L (ref 135–145)
Total Bilirubin: 1.1 mg/dL (ref 0.0–1.2)
Total Protein: 7.1 g/dL (ref 6.5–8.1)

## 2024-06-06 LAB — CBG MONITORING, ED: Glucose-Capillary: 122 mg/dL — ABNORMAL HIGH (ref 70–99)

## 2024-06-06 LAB — PROTIME-INR
INR: 1 (ref 0.8–1.2)
Prothrombin Time: 13.7 s (ref 11.4–15.2)

## 2024-06-06 LAB — APTT: aPTT: 27 s (ref 24–36)

## 2024-06-06 LAB — I-STAT CHEM 8, ED
BUN: 26 mg/dL — ABNORMAL HIGH (ref 8–23)
Calcium, Ion: 1.16 mmol/L (ref 1.15–1.40)
Chloride: 96 mmol/L — ABNORMAL LOW (ref 98–111)
Creatinine, Ser: 1.1 mg/dL — ABNORMAL HIGH (ref 0.44–1.00)
Glucose, Bld: 125 mg/dL — ABNORMAL HIGH (ref 70–99)
HCT: 47 % — ABNORMAL HIGH (ref 36.0–46.0)
Hemoglobin: 16 g/dL — ABNORMAL HIGH (ref 12.0–15.0)
Potassium: 3.3 mmol/L — ABNORMAL LOW (ref 3.5–5.1)
Sodium: 138 mmol/L (ref 135–145)
TCO2: 28 mmol/L (ref 22–32)

## 2024-06-06 LAB — DIFFERENTIAL
Abs Immature Granulocytes: 0.05 10*3/uL (ref 0.00–0.07)
Basophils Absolute: 0 10*3/uL (ref 0.0–0.1)
Basophils Relative: 0 %
Eosinophils Absolute: 0 10*3/uL (ref 0.0–0.5)
Eosinophils Relative: 0 %
Immature Granulocytes: 0 %
Lymphocytes Relative: 13 %
Lymphs Abs: 1.4 10*3/uL (ref 0.7–4.0)
Monocytes Absolute: 0.8 10*3/uL (ref 0.1–1.0)
Monocytes Relative: 7 %
Neutro Abs: 8.9 10*3/uL — ABNORMAL HIGH (ref 1.7–7.7)
Neutrophils Relative %: 80 %

## 2024-06-06 LAB — CBC
HCT: 46 % (ref 36.0–46.0)
Hemoglobin: 15.7 g/dL — ABNORMAL HIGH (ref 12.0–15.0)
MCH: 30.4 pg (ref 26.0–34.0)
MCHC: 34.1 g/dL (ref 30.0–36.0)
MCV: 89 fL (ref 80.0–100.0)
Platelets: 321 10*3/uL (ref 150–400)
RBC: 5.17 MIL/uL — ABNORMAL HIGH (ref 3.87–5.11)
RDW: 11.9 % (ref 11.5–15.5)
WBC: 11.2 10*3/uL — ABNORMAL HIGH (ref 4.0–10.5)
nRBC: 0 % (ref 0.0–0.2)

## 2024-06-06 LAB — ETHANOL: Alcohol, Ethyl (B): <15 mg/dL (ref <15)

## 2024-06-06 MED ORDER — ACETAMINOPHEN 650 MG RE SUPP
650.0000 mg | RECTAL | Status: DC | PRN
Start: 2024-06-06 — End: 2024-06-28

## 2024-06-06 MED ORDER — BUTALBITAL-APAP-CAFFEINE 50-325-40 MG PO TABS
1.0000 | ORAL_TABLET | Freq: Four times a day (QID) | ORAL | Status: DC | PRN
  Administered 2024-06-07 – 2024-06-13 (×5): 1 via ORAL
  Filled 2024-06-06 (×5): qty 1

## 2024-06-06 MED ORDER — SODIUM CHLORIDE 0.9% FLUSH
3.0000 mL | Freq: Two times a day (BID) | INTRAVENOUS | Status: DC
  Administered 2024-06-06: 3 mL via INTRAVENOUS
  Administered 2024-06-07: 10 mL via INTRAVENOUS
  Administered 2024-06-07: 3 mL via INTRAVENOUS
  Administered 2024-06-08 – 2024-06-10 (×5): 10 mL via INTRAVENOUS
  Administered 2024-06-11: 5 mL via INTRAVENOUS
  Administered 2024-06-11 – 2024-06-16 (×11): 10 mL via INTRAVENOUS

## 2024-06-06 MED ORDER — POTASSIUM CHLORIDE 20 MEQ PO PACK
20.0000 meq | PACK | ORAL | Status: AC
  Administered 2024-06-06: 20 meq via ORAL
  Filled 2024-06-06: qty 1

## 2024-06-06 MED ORDER — SODIUM CHLORIDE 0.9% FLUSH
3.0000 mL | Freq: Once | INTRAVENOUS | Status: AC
  Administered 2024-06-06: 3 mL via INTRAVENOUS

## 2024-06-06 MED ORDER — STROKE: EARLY STAGES OF RECOVERY BOOK
Freq: Once | Status: AC
  Filled 2024-06-06: qty 1

## 2024-06-06 MED ORDER — ACETAMINOPHEN 325 MG PO TABS
650.0000 mg | ORAL_TABLET | ORAL | Status: DC | PRN
  Administered 2024-06-09 – 2024-06-20 (×4): 650 mg via ORAL
  Filled 2024-06-06 (×6): qty 2

## 2024-06-06 MED ORDER — EZETIMIBE 10 MG PO TABS
10.0000 mg | ORAL_TABLET | Freq: Every day | ORAL | Status: DC
  Administered 2024-06-06: 10 mg via ORAL
  Filled 2024-06-06: qty 1

## 2024-06-06 MED ORDER — GADOBUTROL 1 MMOL/ML IV SOLN
7.0000 mL | Freq: Once | INTRAVENOUS | Status: AC | PRN
  Administered 2024-06-06: 7 mL via INTRAVENOUS

## 2024-06-06 MED ORDER — METOCLOPRAMIDE HCL 5 MG/ML IJ SOLN
10.0000 mg | Freq: Once | INTRAMUSCULAR | Status: AC
  Administered 2024-06-06: 10 mg via INTRAVENOUS
  Filled 2024-06-06: qty 2

## 2024-06-06 MED ORDER — LACTATED RINGERS IV BOLUS
1000.0000 mL | Freq: Once | INTRAVENOUS | Status: AC
  Administered 2024-06-06: 1000 mL via INTRAVENOUS

## 2024-06-06 MED ORDER — SENNOSIDES-DOCUSATE SODIUM 8.6-50 MG PO TABS
1.0000 | ORAL_TABLET | Freq: Every evening | ORAL | Status: DC | PRN
  Administered 2024-06-08 – 2024-06-22 (×2): 1 via ORAL
  Filled 2024-06-06 (×2): qty 1

## 2024-06-06 MED ORDER — TRIMETHOBENZAMIDE HCL 100 MG/ML IM SOLN
200.0000 mg | Freq: Four times a day (QID) | INTRAMUSCULAR | Status: DC | PRN
  Filled 2024-06-06: qty 2

## 2024-06-06 MED ORDER — SUMATRIPTAN SUCCINATE 6 MG/0.5ML ~~LOC~~ SOLN
6.0000 mg | Freq: Once | SUBCUTANEOUS | Status: AC
  Administered 2024-06-06: 6 mg via SUBCUTANEOUS
  Filled 2024-06-06: qty 0.5

## 2024-06-06 MED ORDER — SODIUM CHLORIDE 0.9% FLUSH: 3.0000 mL | INTRAVENOUS | Status: DC | PRN

## 2024-06-06 MED ORDER — SUMATRIPTAN SUCCINATE 6 MG/0.5ML ~~LOC~~ SOLN: 6.0000 mg | SUBCUTANEOUS | Status: DC | PRN

## 2024-06-06 MED ORDER — DIPHENHYDRAMINE HCL 50 MG/ML IJ SOLN: 50.0000 mg | Freq: Four times a day (QID) | INTRAMUSCULAR | Status: DC | PRN

## 2024-06-06 MED ORDER — ACETAMINOPHEN 160 MG/5ML PO SOLN: 650.0000 mg | ORAL | Status: DC | PRN

## 2024-06-06 MED ORDER — PRAVASTATIN SODIUM 40 MG PO TABS
40.0000 mg | ORAL_TABLET | Freq: Every evening | ORAL | Status: DC
  Administered 2024-06-06: 40 mg via ORAL
  Filled 2024-06-06: qty 1

## 2024-06-06 MED ORDER — CLOPIDOGREL BISULFATE 75 MG PO TABS: 75.0000 mg | ORAL_TABLET | Freq: Every day | ORAL | Status: DC

## 2024-06-06 MED ORDER — LACTATED RINGERS IV SOLN: INTRAVENOUS | Status: AC

## 2024-06-06 MED ORDER — SODIUM CHLORIDE 0.9 % IV BOLUS
1000.0000 mL | Freq: Once | INTRAVENOUS | Status: AC
  Administered 2024-06-06: 1000 mL via INTRAVENOUS

## 2024-06-06 NOTE — ED Provider Notes (Signed)
 [place] [facility] AT [facility] Provider Note   CSN: [id number] Arrival date & time: 06/06/24  1304     History  Chief Complaint  Patient presents with   Migraine    Gabriela Moyer is a 83 y.o. female here with headache for 4 days.  Right sided.  Vertigo as well, and some vision changes with the onset of her headache.  Hx of migraines but this is lasting longer than those.  She has a history of peripheral vascular disease and parox a fib for which she had been on Xarelto  and Plavix  for Xarelto  was considered due to GI bleeding, according to her cardiology evaluation for last year  HPI     Home Medications Prior to Admission medications   Medication Sig Start Date End Date Taking? Authorizing Provider  amLODipine  (NORVASC ) 5 MG tablet TAKE 1 TABLET BY MOUTH DAILY 02/12/24   Millicent Ally, MD  clopidogrel  (PLAVIX ) 75 MG tablet TAKE 1 TABLET BY MOUTH DAILY 02/18/24   Millicent Ally, MD  ezetimibe  (ZETIA ) 10 MG tablet TAKE 1 TABLET(10 MG) BY MOUTH DAILY 12/16/22   Millicent Ally, MD  hydrochlorothiazide  (HYDRODIURIL ) 25 MG tablet TAKE 1 TABLET(25 MG) BY MOUTH DAILY 12/30/23   Millicent Ally, MD  levothyroxine  (SYNTHROID , LEVOTHROID) 25 MCG tablet Take 1 tablet by mouth daily. 05/21/18   [provider]  metoprolol  succinate (TOPROL -XL) 100 MG 24 hr tablet TAKE 1 TABLET(100 MG) BY MOUTH DAILY 06/25/23   Avanell Leigh, MD  metoprolol  tartrate (LOPRESSOR ) 50 MG tablet Take 2 hours prior to procedure 05/26/19   Meng, Hao, PA  pravastatin  (PRAVACHOL ) 40 MG tablet Take 1 tablet (40 mg total) by mouth every evening. 02/08/21   Millicent Ally, MD      Allergies    Codeine, Contrast media [iodinated contrast media], and Morphine    Review of Systems   Review of Systems  Physical Exam Updated Vital Signs BP (!) 87/63   Pulse (!) 59   Temp 98.3 F (36.8 C)   Resp (!) 21   Ht 5\' 2"  (1.575 m)   Wt 74 kg   SpO2 100%   BMI 29.84 kg/m  Physical  Exam Constitutional:      General: She is not in acute distress. HENT:     Head: Normocephalic and atraumatic.  Eyes:     Conjunctiva/sclera: Conjunctivae normal.     Pupils: Pupils are equal, round, and reactive to light.  Cardiovascular:     Rate and Rhythm: Normal rate and regular rhythm.  Pulmonary:     Effort: Pulmonary effort is normal. No respiratory distress.  Abdominal:     General: There is no distension.     Tenderness: There is no abdominal tenderness.  Skin:    General: Skin is warm and dry.  Neurological:     Mental Status: She is alert.     Comments: Right visual field deficit peripheral fields, both eyes, full ocular range of motion, speech is clear, no facial droop, no abnormal pronator drift, patient is having balance difficulty and difficulty ambulating  Psychiatric:        Mood and Affect: Mood normal.        Behavior: Behavior normal.     ED Results / Procedures / Treatments   Labs (all labs ordered are listed, but only abnormal results are displayed) Labs Reviewed  CBC - Abnormal; Notable for the following components:      Result Value  WBC 11.2 (*)    RBC 5.17 (*)    Hemoglobin 15.7 (*)    All other components within normal limits  DIFFERENTIAL - Abnormal; Notable for the following components:   Neutro Abs 8.9 (*)    All other components within normal limits  COMPREHENSIVE METABOLIC PANEL WITH GFR - Abnormal; Notable for the following components:   Potassium 3.3 (*)    Chloride 96 (*)    Glucose, Bld 129 (*)    Creatinine, Ser 1.08 (*)    GFR, Estimated 51 (*)    All other components within normal limits  I-STAT CHEM 8, ED - Abnormal; Notable for the following components:   Potassium 3.3 (*)    Chloride 96 (*)    BUN 26 (*)    Creatinine, Ser 1.10 (*)    Glucose, Bld 125 (*)    Hemoglobin 16.0 (*)    HCT 47.0 (*)    All other components within normal limits  CBG MONITORING, ED - Abnormal; Notable for the following components:    Glucose-Capillary 122 (*)    All other components within normal limits  PROTIME-INR  APTT  ETHANOL  LIPID PANEL  HEMOGLOBIN A1C  CBC  COMPREHENSIVE METABOLIC PANEL WITH GFR  URINALYSIS, ROUTINE W REFLEX MICROSCOPIC  CREATININE, URINE, RANDOM  SODIUM, URINE, RANDOM  TSH  T4, FREE    EKG None  Radiology MR Brain W and Wo Contrast Result Date: 06/06/2024 CLINICAL DATA:  Headache, new onset. Concern for optic neuritis, suspected binocular vision loss. History of complex migraines. EXAM: MRI HEAD AND ORBITS WITHOUT AND WITH CONTRAST TECHNIQUE: Multiplanar, multiecho pulse sequences of the brain and surrounding structures were obtained without and with intravenous contrast. Multiplanar, multiecho pulse sequences of the orbits and surrounding structures were obtained including fat saturation techniques, before and after intravenous contrast administration. CONTRAST:  7mL GADAVIST GADOBUTROL 1 MMOL/ML IV SOLN COMPARISON:  Earlier same day CT head. FINDINGS: MRI HEAD FINDINGS Brain: There is restricted diffusion involving the cortex and subcortical white matter in the right occipital lobe with mild associated edema and sulcal effacement. There is leptomeningeal/pial-arachnoid enhancement within this region. Additional areas of enhancement along the sulci in the posterior left frontal lobe and high left parietal lobe corresponding to areas of abnormality on same day CT concerning for additional leptomeningeal/pial-arachnoid enhancement. There is abnormal sulcal FLAIR hyperintensity in this region without evidence of parenchymal edema or enhancement. Additional diffuse dural enhancement noted. T2/FLAIR hyperintensity in the periventricular and subcortical white matter. No evidence of intracranial hemorrhage. No midline shift. The basilar cisterns are patent. No extra-axial fluid collections. Ventricles: Normal size and configuration of the ventricles. Mildly T1 hypointense focus along the ependymal surface  of the right lateral ventricle with signal similar to cortical gray matter which may reflect focus of heterotopic gray matter. No associated enhancement. Vascular: Skull base flow voids are visualized. Skull and upper cervical spine: No focal abnormality. Other: Mastoid air cells are clear. MRI ORBITS FINDINGS Orbits: No traumatic or inflammatory finding. Globes, optic nerves, orbital fat, extraocular muscles, vascular structures, and lacrimal glands are normal. There is no edema or enlargement of the optic nerves or abnormal enhancement to suggest optic neuritis. Visualized sinuses: Clear. Soft tissues: Negative. IMPRESSION: Leptomeningeal/pial-arachnoid enhancement in the posterior left frontal lobe and high left parietal lobe corresponding to area of abnormality on CT. No definite underlying parenchymal enhancement or edema. 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 Findings most concerning for meningitis and possible focus  of encephalitis in the right occipital lobe. Recommend lumbar puncture for further evaluation. Differential could also include leptomeningeal carcinomatosis. Complex migraine and associated vasospasm could potentially lead to infarct although the degree of enhancement especially in the frontoparietal lobes is atypical. Diffuse dural enhancement noted. Nodule along the surface of the right lateral ventricle with signal intensity similar to cortical gray matter without associated enhancement, suggestive of gray matter heterotopia. Unremarkable MRI of the orbits.  No findings of optic neuritis. Electronically Signed   By: Denny Flack M.D.   On: 06/06/2024 17:44   MR ORBITS W WO CONTRAST Result Date: 06/06/2024 CLINICAL DATA:  Headache, new onset. Concern for optic neuritis, suspected binocular vision loss. History of complex migraines. EXAM: MRI HEAD AND ORBITS WITHOUT AND WITH CONTRAST TECHNIQUE: Multiplanar, multiecho  pulse sequences of the brain and surrounding structures were obtained without and with intravenous contrast. Multiplanar, multiecho pulse sequences of the orbits and surrounding structures were obtained including fat saturation techniques, before and after intravenous contrast administration. CONTRAST:  7mL GADAVIST GADOBUTROL 1 MMOL/ML IV SOLN COMPARISON:  Earlier same day CT head. FINDINGS: MRI HEAD FINDINGS Brain: There is restricted diffusion involving the cortex and subcortical white matter in the right occipital lobe with mild associated edema and sulcal effacement. There is leptomeningeal/pial-arachnoid enhancement within this region. Additional areas of enhancement along the sulci in the posterior left frontal lobe and high left parietal lobe corresponding to areas of abnormality on same day CT concerning for additional leptomeningeal/pial-arachnoid enhancement. There is abnormal sulcal FLAIR hyperintensity in this region without evidence of parenchymal edema or enhancement. Additional diffuse dural enhancement noted. T2/FLAIR hyperintensity in the periventricular and subcortical white matter. No evidence of intracranial hemorrhage. No midline shift. The basilar cisterns are patent. No extra-axial fluid collections. Ventricles: Normal size and configuration of the ventricles. Mildly T1 hypointense focus along the ependymal surface of the right lateral ventricle with signal similar to cortical gray matter which may reflect focus of heterotopic gray matter. No associated enhancement. Vascular: Skull base flow voids are visualized. Skull and upper cervical spine: No focal abnormality. Other: Mastoid air cells are clear. MRI ORBITS FINDINGS Orbits: No traumatic or inflammatory finding. Globes, optic nerves, orbital fat, extraocular muscles, vascular structures, and lacrimal glands are normal. There is no edema or enlargement of the optic nerves or abnormal enhancement to suggest optic neuritis. Visualized  sinuses: Clear. Soft tissues: Negative. IMPRESSION: Leptomeningeal/pial-arachnoid enhancement in the posterior left frontal lobe and high left parietal lobe corresponding to area of abnormality on CT. No definite underlying parenchymal enhancement or edema. 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 Findings most concerning for meningitis and possible focus of encephalitis in the right occipital lobe. Recommend lumbar puncture for further evaluation. Differential could also include leptomeningeal carcinomatosis. Complex migraine and associated vasospasm could potentially lead to infarct although the degree of enhancement especially in the frontoparietal lobes is atypical. Diffuse dural enhancement noted. Nodule along the surface of the right lateral ventricle with signal intensity similar to cortical gray matter without associated enhancement, suggestive of gray matter heterotopia. Unremarkable MRI of the orbits.  No findings of optic neuritis. Electronically Signed   By: Denny Flack M.D.   On: 06/06/2024 17:44   CT Head Wo Contrast Result Date: 06/06/2024 CLINICAL DATA:  Headache EXAM: CT HEAD WITHOUT CONTRAST TECHNIQUE: Contiguous axial images were obtained from the base of the skull through the vertex without intravenous contrast. RADIATION DOSE REDUCTION: This exam was performed according to the  departmental dose-optimization program which includes automated exposure control, adjustment of the mA and/or kV according to patient size and/or use of iterative reconstruction technique. COMPARISON:  06/19/2019 FINDINGS: Brain: Areas of high density in left frontal region. It is difficult to determine if this is subarachnoid hemorrhage or within the cortex. Low-density area in the right occipital lobe. This could reflect infarct, or edema related to an underlying lesion. Small high-density 6 mm area adjacent to the right lateral ventricle  concerning for brain lesion. No hydrocephalus. Vascular: No hyperdense vessel or unexpected calcification. Skull: No acute calvarial abnormality. Sinuses/Orbits: No acute findings Other: None IMPRESSION: Amorphous high-density areas noted in the left frontal region along the cortex and sulci. It is difficult to determine if this is subarachnoid hemorrhage or within the cortex. 6 mm small hyperdense area adjacent to the right lateral ventricle concerning for small brain lesion. Low-density area in the right occipital lobe could reflect infarct or edema related to underlying lesion. Recommend MRI without and with contrast for further evaluation. Electronically Signed   By: Janeece Mechanic M.D.   On: 06/06/2024 16:15    Procedures Procedures    Medications Ordered in ED Medications  sodium chloride  flush (NS) 0.9 % injection 3 mL (has no administration in time range)  ezetimibe  (ZETIA ) tablet 10 mg (has no administration in time range)  pravastatin  (PRAVACHOL ) tablet 40 mg (has no administration in time range)   stroke: early stages of recovery book (has no administration in time range)  acetaminophen  (TYLENOL ) tablet 650 mg (has no administration in time range)    Or  acetaminophen  (TYLENOL ) 160 MG/5ML solution 650 mg (has no administration in time range)    Or  acetaminophen  (TYLENOL ) suppository 650 mg (has no administration in time range)  senna-docusate (Senokot-S) tablet 1 tablet (has no administration in time range)  sodium chloride  flush (NS) 0.9 % injection 3-10 mL (has no administration in time range)  sodium chloride  flush (NS) 0.9 % injection 3-10 mL (has no administration in time range)  SUMAtriptan (IMITREX) injection 6 mg (has no administration in time range)  trimethobenzamide (TIGAN) injection 200 mg (has no administration in time range)  lactated ringers bolus 1,000 mL (has no administration in time range)  lactated ringers infusion (has no administration in time range)   diphenhydrAMINE (BENADRYL) injection 50 mg (has no administration in time range)  butalbital-acetaminophen -caffeine (FIORICET) 50-325-40 MG per tablet 1 tablet (has no administration in time range)  potassium chloride  (KLOR-CON ) packet 20 mEq (has no administration in time range)  gadobutrol (GADAVIST) 1 MMOL/ML injection 7 mL (7 mLs Intravenous Contrast Given 06/06/24 1652)  metoCLOPramide (REGLAN) injection 10 mg (10 mg Intravenous Given 06/06/24 2114)  sodium chloride  0.9 % bolus 1,000 mL (1,000 mLs Intravenous New Bag/Given 06/06/24 2114)    ED Course/ Medical Decision Making/ A&P Clinical Course as of 06/06/24 2145  Sun Jun 06, 2024  1826 Neuro paged [MT]  1915 Spoke to neurology who will evaluate patient - unclear from isolated MRI imaging that this is truly infectious etiology; noted also findings of possible occipital stroke and visual cuts but outside stroke intervention window [MT]  2056 Dr Murvin Arthurs consulted - will see patient- agrees with medical admission for subacute stroke, formal recommendations to follow.  At this time my clinical suspicion for bacterial meningitis is quite low, and I do not feel she needs an emergent LP.  Neuro consult pending [MT]    Clinical Course User Index [MT] Viha Kriegel, Janalyn Me, MD  Medical Decision Making Risk Prescription drug management. Decision regarding hospitalization.   This patient presents to the ED with concern for headache, blurred vision, difficulty with ambulation and vertigo. This involves an extensive number of treatment options, and is a complaint that carries with it a high risk of complications and morbidity.  The differential diagnosis includes CVA versus metabolic encephalopathy versus other  Co-morbidities that complicate the patient evaluation: Cardiovascular and stroke risk factors  Additional history obtained from family member at bedside  External records from outside source obtained and  reviewed including history of diffuse peripheral vascular disease, cardiac stenting, GI bleeding on Xarelto  which has been discontinued, continued on Plavix   I ordered and personally interpreted labs.  The pertinent results include: No emergent findings.  I ordered imaging studies including MRI of the brain, ct head, MR orbits I independently visualized and interpreted imaging which showed  concerning for leptomeningeal enhancement, likely acute occipital right-sided infarct I agree with the radiologist interpretation  The patient was maintained on a cardiac monitor.  I personally viewed and interpreted the cardiac monitored which showed an underlying rhythm of: NSR  I ordered medication including IV Reglan for headache, IV fluids for hydration  I have reviewed the patients home medicines and have made adjustments as needed  Test Considered: no clear evidence of meningitis at this time; no emergent indication for LP  I requested consultation with the neurology,  and discussed lab and imaging findings as well as pertinent plan - they recommend: formal consult pending  After the interventions noted above, I reevaluated the patient and found that they have: stayed the same  Disposition:  After consideration of the diagnostic results and the patient's response to treatment, I feel that the patent would benefit from medical admission.         Final Clinical Impression(s) / ED Diagnoses Final diagnoses:  Cerebrovascular accident (CVA), unspecified mechanism (HCC)  Nonintractable headache, unspecified chronicity pattern, unspecified headache type    Rx / DC Orders ED Discharge Orders     None         Arvilla Birmingham, MD 06/06/24 2145

## 2024-06-06 NOTE — ED Triage Notes (Signed)
 Pt has history of complex migraines and they usually subside. Pt has has vision changes to both eyes, nausea, headache since Thursday. Pt states she just feels sick. This is like her normal migraines but It has just lasted longer. That is the only difference in this migraine. All symptoms are baseline migraine for her.

## 2024-06-06 NOTE — ED Notes (Signed)
 The pt just returned from ct and mri

## 2024-06-06 NOTE — ED Notes (Signed)
 [facility] notified

## 2024-06-06 NOTE — ED Notes (Signed)
 To c-t  mri has called they will call for her [place]

## 2024-06-06 NOTE — H&P (Addendum)
 History and Physical    Gabriela Moyer:[id number] DOB: 09/03/1941 DOA: 06/06/2024  PCP: [name], [name], MD   Patient coming from: Home   Chief Complaint:  Chief Complaint  Patient presents with   Migraine   ED TRIAGE note:  Pt has history of complex migraines and they usually subside. Pt has has vision changes to both eyes, nausea, headache since Thursday. Pt states she just feels sick. This is like her normal migraines but It has just lasted longer. That is the only difference in this migraine. All symptoms are baseline migraine for her.       HPI:  Gabriela Moyer is a 83 y.o. female with medical history significant of chronic migraine, essential hypertension, hyperlipidemia, extensive  PAD of subclavian, axillary, brachial and renal artery status post right renal artery stent 2010, hypothyroidism and paroxysmal afibrillation not on Xarelto  due to history of GI bleed recent emergency department complaining of headache for 4 days which is right-sided with associated vertigo, some vision change with the onset of the headache.  Patient does report she has migraine attack like this however during this time migraine attack has been lasted longer.  Per ED physician Dr. Claudio Culver patient has right-sided visual cut of the peripheral vision, associated vertigo.  During my evaluation at the bedside patient right-sided visual field deficit with associated headache and feeling nauseated in the stomach.  Patient also reported vertigo at the same time.  All the symptom has been persistent for last 4 days.  Patient denies any focal, generalized weakness, speech difficulty, sensory difficulty, tremor, tingling, syncope, presyncope, loss of consciousness and seizure.   ED Course:  At presentation to ED patient found bradycardic heart upper 50s range and borderline hypotensive otherwise hemodynamically stable. CBC showing leukocytosis 11.2, elevated H&H 15.7 and normal platelet count 321. CMP  showing low potassium 3.3 elevated creatinine 1.08 and GFR 51. Blood alcohol  of less than 15. Normal pro time INR.  CT head: Amorphous high-density areas noted in the left frontal region along the cortex and sulci. It is difficult to determine if this is subarachnoid hemorrhage or within the cortex.   6 mm small hyperdense area adjacent to the right lateral ventricle concerning for small brain lesion.   Low-density area in the right occipital lobe could reflect infarct or edema related to underlying lesion.  MRI brain showed: Leptomeningeal/pial-arachnoid enhancement in the posterior left frontal lobe and high left parietal lobe corresponding to area of abnormality on CT. No definite underlying parenchymal enhancement or edema. 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 Findings most concerning for meningitis and possible focus of encephalitis in the right occipital lobe. Recommend lumbar puncture for further evaluation.   Differential could also include leptomeningeal carcinomatosis. Complex migraine and associated vasospasm could potentially lead to infarct although the degree of enhancement especially in the frontoparietal lobes is atypical.   Diffuse dural enhancement noted.   Nodule along the surface of the right lateral ventricle with signal intensity similar to cortical gray matter without associated enhancement, suggestive of gray matter heterotopia.   Unremarkable MRI of the orbits.  No findings of optic neuritis.   Per ED physician Dr. Claudio Culver patient has right-sided visual cut of the peripheral vision, associated vertigo.  ED physician discussed case with on-call neurology Dr.Khaliqdina with complex finding on the MRI of the brain.  Per neurology hold of any Plavix  and will see patient soon in case patient will need LP.  Neurology recommended  admit patient for stroke workup and further  recommendation is pending.  There is no concern for meningitis based on patient's symptoms and stable vitals.  While in the ED patient is still nauseated received Reglan.  Hospitalist has been consulted for management of headache, right-sided vision loss and dizziness in the setting of a stroke  versus leptomeningeal carcinomatosis versus complex migraine.  Have to wait for formal neurology recommendation.    Significant labs in the ED: Lab Orders         Protime-INR         APTT         CBC         Differential         Comprehensive metabolic panel         Ethanol         Lipid panel         Hemoglobin A1c         CBC         Comprehensive metabolic panel         Urinalysis, Routine w reflex microscopic -Urine, Clean Catch         Creatinine, urine, random         Sodium, urine, random         TSH         T4, free         I-stat chem 8, ED         CBG monitoring, ED       Review of Systems:  Review of Systems  Constitutional:  Negative for chills, diaphoresis, fever, malaise/fatigue and weight loss.  HENT:  Negative for hearing loss and tinnitus.        Right-sided headache  Eyes:  Positive for blurred vision. Negative for double vision and photophobia.  Respiratory:  Negative for cough, hemoptysis, sputum production, shortness of breath and wheezing.   Cardiovascular:  Negative for chest pain, palpitations and leg swelling.  Musculoskeletal:  Negative for back pain, joint pain, myalgias and neck pain.  Neurological:  Positive for dizziness and headaches. Negative for tingling, tremors, sensory change, speech change, focal weakness, seizures, loss of consciousness and weakness.  Psychiatric/Behavioral:  The patient is not nervous/anxious.     Past Medical History:  Diagnosis Date   Carotid arterial disease (HCC)    a. 08/2016 Carotid U/S: 1-39% bilat ICA stenosis, >50 R ECA stenosis.   Diastolic dysfunction    a. 11/2016 Echo: EF 65-70%, Gr1 DD.   History of stress  test    a. 12/2016 Lexiscan  MV: EF 73%, no ischemia/infarct.   Hyperlipidemia    Hypertension    PAF (paroxysmal atrial fibrillation) (HCC)    a. 11/2016 AF RVR in ED-->converted on IV dilt-->CHA2DS2VASc = 4-->Xarelto .   Renal artery stenosis (HCC)    a. 06/2009 s/p R Renal Artery PTA/stenting; b. 08/2016 Renal Duplex: nl LRA, >60% RRA stenosis- f/u 1 yr.   Subclavian arterial stenosis (HCC)    a. 08/2014 Upper Ext Duplex: R distal [place] & Ax - 70-99% diam reduction, L distal [place] & Ax - 50-69%--stable.    Past Surgical History:  Procedure Laterality Date   CARDIAC CATHETERIZATION  11/03/2007   atherosclerotic right renal artery (Dr. Jammie Mccune)   Carotid Doppler  12/2010   R & L ICA with small amount fibrous plaque; R distal subclav. 70-99% diameter reduction; L mid subclav 50-69% diameter reduction   Lower Extremity Arterial Doppler  03/2008   right ABI -  mild arterial insuff at rest; R CIA with less than 50% diameter reduction, R & L SFA with less than 50% diameter reduction   NM MYOCAR PERF WALL MOTION  09/2009   bruce myoview  - normal perfusion, EF 915, low risk scan   RENAL ANGIOPLASTY  11/07/2009   5x33mm Cordis Genesis Aviator stent to ostium of right renal artery (Dr. Aleda Ammon)   Renal Artery Doppler  04/2012   celiac artery with >50% diameter reduction; R renal artery stent w/mildly elevated velocities (1-59% diameter reduction)   TRANSTHORACIC ECHOCARDIOGRAM  08/28/2009   EF=>55%; trace TR; mild AV regurg, mild pulm valve regurg   Upper Extremity Arterial Doppler  04/2012   right brachial pressure , left ; R distal subclavian & axillary arteries 70-99% diameter reduction; L distal subclavian/axillary & brachial arteries (50-69% diameter reduction)     reports that she has never smoked. She has been exposed to tobacco smoke. She has never used smokeless tobacco. She reports that she does not drink alcohol  and does not use drugs.  Allergies  Allergen Reactions   Codeine Nausea  And Vomiting    Abdominal pain   Contrast Media [Iodinated Contrast Media] Nausea And Vomiting    Abdominal pain Caused low blood pressure   Morphine Nausea And Vomiting    Abdominal pain    Family History  Problem Relation Age of Onset   Heart disease Mother    Stroke Mother    Cancer Father    Heart Problems Maternal Grandmother    Heart Problems Maternal Grandfather    Cancer Brother     Prior to Admission medications   Medication Sig Start Date End Date Taking? Authorizing Provider  amLODipine  (NORVASC ) 5 MG tablet TAKE 1 TABLET BY MOUTH DAILY 02/12/24   Millicent Ally, MD  clopidogrel  (PLAVIX ) 75 MG tablet TAKE 1 TABLET BY MOUTH DAILY 02/18/24   Millicent Ally, MD  ezetimibe  (ZETIA ) 10 MG tablet TAKE 1 TABLET(10 MG) BY MOUTH DAILY 12/16/22   Millicent Ally, MD  hydrochlorothiazide  (HYDRODIURIL ) 25 MG tablet TAKE 1 TABLET(25 MG) BY MOUTH DAILY 12/30/23   Millicent Ally, MD  levothyroxine  (SYNTHROID , LEVOTHROID) 25 MCG tablet Take 1 tablet by mouth daily. 05/21/18   [provider]  metoprolol  succinate (TOPROL -XL) 100 MG 24 hr tablet TAKE 1 TABLET(100 MG) BY MOUTH DAILY 06/25/23   Avanell Leigh, MD  metoprolol  tartrate (LOPRESSOR ) 50 MG tablet Take 2 hours prior to procedure 05/26/19   Meng, Hao, PA  pravastatin  (PRAVACHOL ) 40 MG tablet Take 1 tablet (40 mg total) by mouth every evening. 02/08/21   Millicent Ally, MD     Physical Exam: Vitals:   06/06/24 1731 06/06/24 2000 06/06/24 2118 06/06/24 2120  BP: 105/65 (!) 113/54 (!) 87/63   Pulse: (!) 57 60 (!) 59   Resp: 17 12 (!) 21   Temp: 98 F (36.7 C)   98.3 F (36.8 C)  SpO2: 100% 100% 100%   Weight:      Height:        Physical Exam Vitals and nursing note reviewed.  Constitutional:      General: She is not in acute distress.    Appearance: She is not ill-appearing.  HENT:     Nose: Nose normal.     Mouth/Throat:     Mouth: Mucous membranes are moist.  Eyes:     Pupils: Pupils are equal,  round, and reactive to light.  Cardiovascular:     Rate  and Rhythm: Regular rhythm. Bradycardia present.     Pulses: Normal pulses.     Heart sounds: Murmur heard.  Pulmonary:     Effort: Pulmonary effort is normal.     Breath sounds: Normal breath sounds.  Abdominal:     Palpations: Abdomen is soft.  Musculoskeletal:     Cervical back: Normal range of motion and neck supple.  Skin:    Capillary Refill: Capillary refill takes less than 2 seconds.     Findings: No lesion or rash.  Neurological:     Mental Status: She is alert and oriented to person, place, and time.     Cranial Nerves: Cranial nerve deficit present.     Sensory: No sensory deficit.     Motor: No weakness.     Coordination: Coordination normal.  Psychiatric:        Mood and Affect: Mood normal.        Thought Content: Thought content normal.        Judgment: Judgment normal.      Labs on Admission: I have personally reviewed following labs and imaging studies  CBC: Recent Labs  Lab 06/06/24 1458 06/06/24 1503  WBC 11.2*  --   NEUTROABS 8.9*  --   HGB 15.7* 16.0*  HCT 46.0 47.0*  MCV 89.0  --   PLT 321  --    Basic Metabolic Panel: Recent Labs  Lab 06/06/24 1458 06/06/24 1503  NA 138 138  K 3.3* 3.3*  CL 96* 96*  CO2 29  --   GLUCOSE 129* 125*  BUN 22 26*  CREATININE 1.08* 1.10*  CALCIUM  10.1  --    GFR: Estimated Creatinine Clearance: 35.9 mL/min (A) (by C-G formula based on SCr of 1.1 mg/dL (H)). Liver Function Tests: Recent Labs  Lab 06/06/24 1458  AST 22  ALT 13  ALKPHOS 58  BILITOT 1.1  PROT 7.1  ALBUMIN 3.7   No results for input(s): "LIPASE", "AMYLASE" in the last 168 hours. No results for input(s): "AMMONIA" in the last 168 hours. Coagulation Profile: Recent Labs  Lab 06/06/24 1458  INR 1.0   Cardiac Enzymes: No results for input(s): "CKTOTAL", "CKMB", "CKMBINDEX", "TROPONINI", "TROPONINIHS" in the last 168 hours. BNP (last 3 results) No results for input(s): "BNP"  in the last 8760 hours. HbA1C: No results for input(s): "HGBA1C" in the last 72 hours. CBG: Recent Labs  Lab 06/06/24 1501  GLUCAP 122*   Lipid Profile: No results for input(s): "CHOL", "HDL", "LDLCALC", "TRIG", "CHOLHDL", "LDLDIRECT" in the last 72 hours. Thyroid  Function Tests: No results for input(s): "TSH", "T4TOTAL", "FREET4", "T3FREE", "THYROIDAB" in the last 72 hours. Anemia Panel: No results for input(s): "VITAMINB12", "FOLATE", "FERRITIN", "TIBC", "IRON", "RETICCTPCT" in the last 72 hours. Urine analysis:    Component Value Date/Time   COLORURINE YELLOW 11/03/2007 1100   APPEARANCEUR CLEAR 11/03/2007 1100   LABSPEC 1.014 11/03/2007 1100   PHURINE 6.0 11/03/2007 1100   GLUCOSEU NEGATIVE 11/03/2007 1100   HGBUR NEGATIVE 11/03/2007 1100   BILIRUBINUR NEGATIVE 11/03/2007 1100   KETONESUR 15 (A) 11/03/2007 1100   PROTEINUR NEGATIVE 11/03/2007 1100   UROBILINOGEN 0.2 11/03/2007 1100   NITRITE NEGATIVE 11/03/2007 1100   LEUKOCYTESUR TRACE (A) 11/03/2007 1100    Radiological Exams on Admission: I have personally reviewed images MR Brain W and Wo Contrast Result Date: 06/06/2024 CLINICAL DATA:  Headache, new onset. Concern for optic neuritis, suspected binocular vision loss. History of complex migraines. EXAM: MRI HEAD AND ORBITS WITHOUT AND WITH CONTRAST  TECHNIQUE: Multiplanar, multiecho pulse sequences of the brain and surrounding structures were obtained without and with intravenous contrast. Multiplanar, multiecho pulse sequences of the orbits and surrounding structures were obtained including fat saturation techniques, before and after intravenous contrast administration. CONTRAST:  7mL GADAVIST GADOBUTROL 1 MMOL/ML IV SOLN COMPARISON:  Earlier same day CT head. FINDINGS: MRI HEAD FINDINGS Brain: There is restricted diffusion involving the cortex and subcortical white matter in the right occipital lobe with mild associated edema and sulcal effacement. There is  leptomeningeal/pial-arachnoid enhancement within this region. Additional areas of enhancement along the sulci in the posterior left frontal lobe and high left parietal lobe corresponding to areas of abnormality on same day CT concerning for additional leptomeningeal/pial-arachnoid enhancement. There is abnormal sulcal FLAIR hyperintensity in this region without evidence of parenchymal edema or enhancement. Additional diffuse dural enhancement noted. T2/FLAIR hyperintensity in the periventricular and subcortical white matter. No evidence of intracranial hemorrhage. No midline shift. The basilar cisterns are patent. No extra-axial fluid collections. Ventricles: Normal size and configuration of the ventricles. Mildly T1 hypointense focus along the ependymal surface of the right lateral ventricle with signal similar to cortical gray matter which may reflect focus of heterotopic gray matter. No associated enhancement. Vascular: Skull base flow voids are visualized. Skull and upper cervical spine: No focal abnormality. Other: Mastoid air cells are clear. MRI ORBITS FINDINGS Orbits: No traumatic or inflammatory finding. Globes, optic nerves, orbital fat, extraocular muscles, vascular structures, and lacrimal glands are normal. There is no edema or enlargement of the optic nerves or abnormal enhancement to suggest optic neuritis. Visualized sinuses: Clear. Soft tissues: Negative. IMPRESSION: Leptomeningeal/pial-arachnoid enhancement in the posterior left frontal lobe and high left parietal lobe corresponding to area of abnormality on CT. No definite underlying parenchymal enhancement or edema. 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 Findings most concerning for meningitis and possible focus of encephalitis in the right occipital lobe. Recommend lumbar puncture for further evaluation. Differential could also include leptomeningeal carcinomatosis.  Complex migraine and associated vasospasm could potentially lead to infarct although the degree of enhancement especially in the frontoparietal lobes is atypical. Diffuse dural enhancement noted. Nodule along the surface of the right lateral ventricle with signal intensity similar to cortical gray matter without associated enhancement, suggestive of gray matter heterotopia. Unremarkable MRI of the orbits.  No findings of optic neuritis. Electronically Signed   By: Denny Flack M.D.   On: 06/06/2024 17:44   MR ORBITS W WO CONTRAST Result Date: 06/06/2024 CLINICAL DATA:  Headache, new onset. Concern for optic neuritis, suspected binocular vision loss. History of complex migraines. EXAM: MRI HEAD AND ORBITS WITHOUT AND WITH CONTRAST TECHNIQUE: Multiplanar, multiecho pulse sequences of the brain and surrounding structures were obtained without and with intravenous contrast. Multiplanar, multiecho pulse sequences of the orbits and surrounding structures were obtained including fat saturation techniques, before and after intravenous contrast administration. CONTRAST:  7mL GADAVIST GADOBUTROL 1 MMOL/ML IV SOLN COMPARISON:  Earlier same day CT head. FINDINGS: MRI HEAD FINDINGS Brain: There is restricted diffusion involving the cortex and subcortical white matter in the right occipital lobe with mild associated edema and sulcal effacement. There is leptomeningeal/pial-arachnoid enhancement within this region. Additional areas of enhancement along the sulci in the posterior left frontal lobe and high left parietal lobe corresponding to areas of abnormality on same day CT concerning for additional leptomeningeal/pial-arachnoid enhancement. There is abnormal sulcal FLAIR hyperintensity in this region without evidence of parenchymal edema or enhancement. Additional diffuse  dural enhancement noted. T2/FLAIR hyperintensity in the periventricular and subcortical white matter. No evidence of intracranial hemorrhage. No midline  shift. The basilar cisterns are patent. No extra-axial fluid collections. Ventricles: Normal size and configuration of the ventricles. Mildly T1 hypointense focus along the ependymal surface of the right lateral ventricle with signal similar to cortical gray matter which may reflect focus of heterotopic gray matter. No associated enhancement. Vascular: Skull base flow voids are visualized. Skull and upper cervical spine: No focal abnormality. Other: Mastoid air cells are clear. MRI ORBITS FINDINGS Orbits: No traumatic or inflammatory finding. Globes, optic nerves, orbital fat, extraocular muscles, vascular structures, and lacrimal glands are normal. There is no edema or enlargement of the optic nerves or abnormal enhancement to suggest optic neuritis. Visualized sinuses: Clear. Soft tissues: Negative. IMPRESSION: Leptomeningeal/pial-arachnoid enhancement in the posterior left frontal lobe and high left parietal lobe corresponding to area of abnormality on CT. No definite underlying parenchymal enhancement or edema. 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 Findings most concerning for meningitis and possible focus of encephalitis in the right occipital lobe. Recommend lumbar puncture for further evaluation. Differential could also include leptomeningeal carcinomatosis. Complex migraine and associated vasospasm could potentially lead to infarct although the degree of enhancement especially in the frontoparietal lobes is atypical. Diffuse dural enhancement noted. Nodule along the surface of the right lateral ventricle with signal intensity similar to cortical gray matter without associated enhancement, suggestive of gray matter heterotopia. Unremarkable MRI of the orbits.  No findings of optic neuritis. Electronically Signed   By: Denny Flack M.D.   On: 06/06/2024 17:44   CT Head Wo Contrast Result Date: 06/06/2024 CLINICAL DATA:  Headache  EXAM: CT HEAD WITHOUT CONTRAST TECHNIQUE: Contiguous axial images were obtained from the base of the skull through the vertex without intravenous contrast. RADIATION DOSE REDUCTION: This exam was performed according to the departmental dose-optimization program which includes automated exposure control, adjustment of the mA and/or kV according to patient size and/or use of iterative reconstruction technique. COMPARISON:  06/19/2019 FINDINGS: Brain: Areas of high density in left frontal region. It is difficult to determine if this is subarachnoid hemorrhage or within the cortex. Low-density area in the right occipital lobe. This could reflect infarct, or edema related to an underlying lesion. Small high-density 6 mm area adjacent to the right lateral ventricle concerning for brain lesion. No hydrocephalus. Vascular: No hyperdense vessel or unexpected calcification. Skull: No acute calvarial abnormality. Sinuses/Orbits: No acute findings Other: None IMPRESSION: Amorphous high-density areas noted in the left frontal region along the cortex and sulci. It is difficult to determine if this is subarachnoid hemorrhage or within the cortex. 6 mm small hyperdense area adjacent to the right lateral ventricle concerning for small brain lesion. Low-density area in the right occipital lobe could reflect infarct or edema related to underlying lesion. Recommend MRI without and with contrast for further evaluation. Electronically Signed   By: Janeece Mechanic M.D.   On: 06/06/2024 16:15     Pending EKG.  Assessment/Plan: Principal Problem:   Ischemic cerebrovascular accident (CVA) (HCC) Active Problems:   Headache, right-sided blurry vision and dizziness   Migraine headache   AKI (acute kidney injury) (HCC)   Essential hypertension   Hyperlipidemia   History of migraine   PVD (peripheral vascular disease) (HCC)   History of stent insertion of renal artery   History of paroxysmal atrial tachycardia   History of GI  bleed    Assessment and  Plan: Ischemic CVA Headache, right-sided blurry vision, dizziness and nausea -Present emergency department complaining of right-sided headache with right-sided vision cut/blurry vision, dizziness and nausea which has been ongoing for last 4 days.  Patient denies any generalized, focal weakness, sensory change, tingling numbness, dysarthria. -CT head showed extensive multiple findings including left frontal region high density concern for hemorrhage within the cortex, right lateral ventricle 6-minute upper brain lesion, right occipital lobe infarction or edema. - Following MRI brain has extensive differential finding please read for detailed information.  Per MRI differential could be leptomeningeal carcinomatosis versus complex migraine associated vasospasm versus gray matter heterotopia versus meningitis and possible focus of encephalitis in the right occipital lobe. -MRI orbit unremarkable and no finding of optic neuritis. -ED physician discussed with neurology regarding these multiple findings on the MRI.  At this point there is no suspicion for meningitis given patient is afebrile, no nuchal rigidity and there is no clinical symptoms for meningitis. -Concern for headache with right-sided blurry vision, dizziness and nausea in the setting of complex migraine headache versus leptomeningeal carcinomatosis versus acute ischemic stroke. -Physical exam revealed bilateral blurry vision rather than right-sided blurry vision.  Also right-sided postauricular and temporal headache with associated dizziness and nausea.  No sensory change, tremor, tingling, dysarthria, dysphonia, swallowing difficulty, focal/generalized weakness, and no double vision. - Admitting patient for stroke workup and waiting for neurology further recommendation for antiplatelet therapy and need for lumbar puncture or not. -Holding Plavix , aspirin and any pharmacological VTE prophylaxis in case patient will need  lumbar puncture eventually. -Neurology recommended obtain MRI of the brain and neck with and without contrast.  Patient has MRI of the brain at 4 PM and by radiology tech has to wait at least 10 to 12-hour before further contrast exposure.  So the MRI of the brain and neck be in next 10 to 12 hours. -Continue neurocheck every 4 hours. - Checking A1c and lipid panel. - Obtaining echocardiogram. - Pending PT, OT and swallow evaluation. Addendum Neurology recommendation following: ((-Will get stroke workup. -In addition, will get MRI of the C, T, L-spine with and without contrast to evaluate for any leptomeningeal enhancement/carcinomatosis. -Will check ESR, CRP, ANA with IFA, dsDNA, Lyme PCR, RPR, HIV, autoimmune encephalopathy panel, serum ACE, QuantiFERON gold plus.  - I think ultimately the noted leptomeningeal enhancement may need to be to be worked outpatient as it appears to be relatively asymptomatic. However, while we are waiting for the stroke workup to be completed, I think it would still be helpful to work this up. - I would hold off on antiplatelet agents for now given need for LP.)) -Last dose of Plavix  on 06/04/2024 in the night.  Need to hold Plavix  5 days before proceed with LP.  All other imaging of the MRIs of lumbar/thoracic cervical has been ordered by neurology.   Acute migraine headache with associated nausea -In the setting of acute migraine headache giving sumatriptan 6 mg to abort headache.  If patient continues to have persistent headache can give another dose of sumatriptan 6 mg total 12 mg in 24 hours. - Continue Fioricet as needed. -Pending EKG to rule out QT prolongation.  Continue Tigan as needed  Acute kidney injury -Elevated creatinine 1.08.  Prerenal acute kidney injury in the setting of hypotension.  Holding home blood pressure regimen. -Giving 1 L of LR bolus and continue maintenance fluid LR.  Hypotension Asymptomatic bradycardia History of essential  hypertension -Low blood pressure 87/63.  Initially patient blood pressure within  good range however started gradually progressively trending down.  Giving 1 L of LR bolus and starting maintenance fluid LR 50 cc/h. -Heart rate in between 58-60.  Obtaining EKG. -Holding Toprol -XL, hydrochlorothiazide  and amlodipine  in the setting of hypotension and bradycardia.   Hypokalemia -Replating with oral KCl  Hyperlipidemia - Continue continue ezetimibe   Diffuse peripheral vascular disease Renal artery stenosis status post stent -Plavix  on hold in case patient will need lumbar puncture.  History of GI bleed History of paroxysmal atrial fibrillation -Previously patient was on Eliquis however due to history of GI bleed anticoagulation has been on hold unclear timeline.  Holding Toprol -XL in the setting of bradycardia.  Obtaining EKG.  Hypothyroidism? -It is unclear if patient still takes levothyroxine .  Checking TSH and T4.   DVT prophylaxis:  SCDs, TED hose.  Deferring pharmacological DVT prophylaxis in the setting of history of GI bleed and with possible you will undergo lumbar puncture. Code Status:  Full Code Diet: Heart healthy diet Family Communication:   Family was present at bedside, at the time of interview. Opportunity was given to ask question and all questions were answered satisfactorily.  Disposition Plan: Pending neurology recommendation for further workup.  Waiting for echocardiogram, MRI of the neck and head. Consults: Neurology Admission status:   Inpatient, Telemetry bed  Severity of Illness: The appropriate patient status for this patient is INPATIENT. Inpatient status is judged to be reasonable and necessary in order to provide the required intensity of service to ensure the patient's safety. The patient's presenting symptoms, physical exam findings, and initial radiographic and laboratory data in the context of their chronic comorbidities is felt to place them at high risk  for further clinical deterioration. Furthermore, it is not anticipated that the patient will be medically stable for discharge from the hospital within 2 midnights of admission.   * I certify that at the point of admission it is my clinical judgment that the patient will require inpatient hospital care spanning beyond 2 midnights from the point of admission due to high intensity of service, high risk for further deterioration and high frequency of surveillance required.Aaron Aas    Marjo Grosvenor, MD [facility]  How to contact the [facility] Attending or Consulting provider 7A - 7P or covering provider during after hours 7P -7A, for this patient.  Check the care team in [facility] and look for a) attending/consulting [facility] provider listed and b) the [facility] team listed Log into [email or web address] and use [place]'s universal password to access. If you do not have the password, please contact the hospital operator. Locate the [facility] provider you are looking for under [facility] and page to a number that you can be directly reached. If you still have difficulty reaching the provider, please page the [facility] (Director on Call) for the Hospitalists listed on amion for assistance.  06/06/2024, 9:59 PM

## 2024-06-06 NOTE — ED Notes (Signed)
 Zetia  and pravastatin   not given after a discussion  with the pharmacist

## 2024-06-06 NOTE — Consult Note (Signed)
 NEUROLOGY CONSULT NOTE   Date of service: June 06, 2024 Patient Name: Gabriela Moyer MRN:  [id number] DOB:  [birth year] Chief Complaint: "vision abnormalities" Requesting Provider: Arvilla Birmingham, MD  History of Present Illness  Gabriela Moyer is a 83 y.o. female with hx of CAD, HTN, HLD, peripheral vascular disease, pafibb not on AC for atleas 5 years due to 2/2 GI bleed, renal artery stenosis, hx of migraines with visual aura who presents with vision changes, headache, nausea.  She reports that she had her typical migraine headache on Thursday.  On Friday, as her headache was letting go, she had vertigo, nausea.  She endorsed that this headache was different than her prior as she has never had the room spinning sensation that is this bad.  She thought that this was going to go away but since the symptoms were persistent, she came to the ED on her sister's insistence.  In the ED, she had workup with CT Head which demonstrated a high left parietal high density. MRI brain w + w /o C which demonstrated Leptomeningeal/pial-arachnoid enhancement in the posterior left frontal lobe and high left parietal lobe corresponding to area of abnormality on CT. 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  Neurology consulted for further evaluation and workup.  Patient denies any fever, no meningismus.  Does not endorse any symptoms of URI or UTI or gastroenteritis.  No recent travels.  No tick bites.  She denies any history of cancers.  She tells me that her cardiology team felt back in 2005, that she could have vasculitis but she is not sure.  She felt something was off with her vision but she she was unable to point to what it was.  LKW: thursday Modified rankin score: 0-Completely asymptomatic and back to baseline post- stroke IV Thrombolysis: Not offered, outside of window  EVT: Not offered, no LVO.    NIHSS components Score: Comment  1a Level of Conscious 0[x]  1[]  2[]  3[]       1b LOC Questions 0[x]  1[]  2[]       1c LOC Commands 0[x]  1[]  2[]       2 Best Gaze 0[x]  1[]  2[]       3 Visual 0[]  1[]  2[x]  3[]      4 Facial Palsy 0[x]  1[]  2[]  3[]      5a Motor Arm - left 0[x]  1[]  2[]  3[]  4[]  UN[]    5b Motor Arm - Right 0[x]  1[]  2[]  3[]  4[]  UN[]    6a Motor Leg - Left 0[x]  1[]  2[]  3[]  4[]  UN[]    6b Motor Leg - Right 0[x]  1[]  2[]  3[]  4[]  UN[]    7 Limb Ataxia 0[x]  1[]  2[]  UN[]      8 Sensory 0[x]  1[]  2[]  UN[]      9 Best Language 0[x]  1[]  2[]  3[]      10 Dysarthria 0[x]  1[]  2[]  UN[]      11 Extinct. and Inattention 0[x]  1[]  2[]       TOTAL: 2      ROS  Comprehensive ROS performed and pertinent positives documented in HPI   Past History   Past Medical History:  Diagnosis Date   Carotid arterial disease (HCC)    a. 08/2016 Carotid U/S: 1-39% bilat ICA stenosis, >50 R ECA stenosis.   Diastolic dysfunction    a. 11/2016 Echo: EF 65-70%, Gr1 DD.   History of stress test    a. 12/2016 Lexiscan  MV: EF 73%, no ischemia/infarct.   Hyperlipidemia    Hypertension    PAF (  paroxysmal atrial fibrillation) (HCC)    a. 11/2016 AF RVR in ED-->converted on IV dilt-->CHA2DS2VASc = 4-->Xarelto .   Renal artery stenosis (HCC)    a. 06/2009 s/p R Renal Artery PTA/stenting; b. 08/2016 Renal Duplex: nl LRA, >60% RRA stenosis- f/u 1 yr.   Subclavian arterial stenosis (HCC)    a. 08/2014 Upper Ext Duplex: R distal [place] & Ax - 70-99% diam reduction, L distal Waves & Ax - 50-69%--stable.    Past Surgical History:  Procedure Laterality Date   CARDIAC CATHETERIZATION  11/03/2007   atherosclerotic right renal artery (Dr. Jammie Mccune)   Carotid Doppler  12/2010   R & L ICA with small amount fibrous plaque; R distal subclav. 70-99% diameter reduction; L mid subclav 50-69% diameter reduction   Lower Extremity Arterial Doppler  03/2008   right ABI - mild arterial insuff at rest; R CIA with less than 50% diameter reduction, R & L SFA with less than 50% diameter reduction   NM MYOCAR PERF WALL MOTION  09/2009    bruce myoview  - normal perfusion, EF 915, low risk scan   RENAL ANGIOPLASTY  11/07/2009   5x26mm Cordis Genesis Aviator stent to ostium of right renal artery (Dr. Aleda Ammon)   Renal Artery Doppler  04/2012   celiac artery with >50% diameter reduction; R renal artery stent w/mildly elevated velocities (1-59% diameter reduction)   TRANSTHORACIC ECHOCARDIOGRAM  08/28/2009   EF=>55%; trace TR; mild AV regurg, mild pulm valve regurg   Upper Extremity Arterial Doppler  04/2012   right brachial pressure , left ; R distal subclavian & axillary arteries 70-99% diameter reduction; L distal subclavian/axillary & brachial arteries (50-69% diameter reduction)    Family History: Family History  Problem Relation Age of Onset   Heart disease Mother    Stroke Mother    Cancer Father    Heart Problems Maternal Grandmother    Heart Problems Maternal Grandfather    Cancer Brother     Social History  reports that she has never smoked. She has been exposed to tobacco smoke. She has never used smokeless tobacco. She reports that she does not drink alcohol  and does not use drugs.  Allergies  Allergen Reactions   Codeine Nausea And Vomiting    Abdominal pain   Contrast Media [Iodinated Contrast Media] Nausea And Vomiting    Abdominal pain Caused low blood pressure   Morphine Nausea And Vomiting    Abdominal pain    Medications   Current Facility-Administered Medications:    metoCLOPramide (REGLAN) injection 10 mg, 10 mg, Intravenous, Once, Trifan, Janalyn Me, MD   sodium chloride  flush (NS) 0.9 % injection 3 mL, 3 mL, Intravenous, Once, Spence Dux, PA-C  Current Outpatient Medications:    amLODipine  (NORVASC ) 5 MG tablet, TAKE 1 TABLET BY MOUTH DAILY, Disp: 90 tablet, Rfl: 1   clopidogrel  (PLAVIX ) 75 MG tablet, TAKE 1 TABLET BY MOUTH DAILY, Disp: 90 tablet, Rfl: 0   ezetimibe  (ZETIA ) 10 MG tablet, TAKE 1 TABLET(10 MG) BY MOUTH DAILY, Disp: 90 tablet, Rfl: 3   hydrochlorothiazide   (HYDRODIURIL ) 25 MG tablet, TAKE 1 TABLET(25 MG) BY MOUTH DAILY, Disp: 90 tablet, Rfl: 1   levothyroxine  (SYNTHROID , LEVOTHROID) 25 MCG tablet, Take 1 tablet by mouth daily., Disp: , Rfl: 0   metoprolol  succinate (TOPROL -XL) 100 MG 24 hr tablet, TAKE 1 TABLET(100 MG) BY MOUTH DAILY, Disp: 90 tablet, Rfl: 3   metoprolol  tartrate (LOPRESSOR ) 50 MG tablet, Take 2 hours prior to procedure, Disp: 1 tablet, Rfl:  0   pravastatin  (PRAVACHOL ) 40 MG tablet, Take 1 tablet (40 mg total) by mouth every evening., Disp: 90 tablet, Rfl: 3  Vitals   Vitals:   06-19-2024 1310 19-Jun-2024 1431 June 19, 2024 1731  BP: 129/67  105/65  Pulse: (!) 58  (!) 57  Resp: 16  17  Temp: 98.1 F (36.7 C)  98 F (36.7 C)  SpO2: 97%  100%  Weight:  74 kg   Height:  5\' 2"  (1.575 m)     Body mass index is 29.84 kg/m.   Physical Exam   General: Laying comfortably in bed; in no acute distress.  HENT: Normal oropharynx and mucosa. Normal external appearance of ears and nose.  Neck: Supple, no pain or tenderness  CV: No JVD. No peripheral edema.  Pulmonary: Symmetric Chest rise. Normal respiratory effort.  Abdomen: Soft to touch, non-tender.  Ext: No cyanosis, edema, or deformity  Skin: No rash. Normal palpation of skin.   Musculoskeletal: Normal digits and nails by inspection. No clubbing.   Neurologic Examination  Mental status/Cognition: Alert, oriented to self, place, month and year, good attention.  Speech/language: Fluent, comprehension intact, object naming intact, repetition intact.  Cranial nerves:   CN II Pupils equal and reactive to light, left hemianopsia.   CN III,IV,VI EOM intact, no gaze preference or deviation, no nystagmus    CN V normal sensation in V1, V2, and V3 segments bilaterally    CN VII no asymmetry, no nasolabial fold flattening    CN VIII normal hearing to speech    CN IX & X normal palatal elevation, no uvular deviation    CN XI 5/5 head turn and 5/5 shoulder shrug bilaterally    CN XII  midline tongue protrusion    Motor:  Muscle bulk: normal, tone normal, pronator drift none tremor none Mvmt Root Nerve  Muscle Right Left Comments  SA C5/6 Ax Deltoid 5 5   EF C5/6 Mc Biceps 5 5   EE C6/7/8 Rad Triceps 5 5   WF C6/7 Med FCR     WE C7/8 PIN ECU     F Ab C8/T1 U ADM/FDI 5 5   HF L1/2/3 Fem Illopsoas 5 5   KE L2/3/4 Fem Quad 5 5   DF L4/5 D Peron Tib Ant 5 5   PF S1/2 Tibial Grc/Sol 5 5    Sensation:  Light touch Intact throughout   Pin prick    Temperature    Vibration   Proprioception    Coordination/Complex Motor:  - Finger to Nose intact BL - Heel to shin intact BL - Rapid alternating movement are normal - Gait: deferred.  Labs/Imaging/Neurodiagnostic studies   CBC:  Recent Labs  Lab 2024-06-19 1458 19-Jun-2024 1503  WBC 11.2*  --   NEUTROABS 8.9*  --   HGB 15.7* 16.0*  HCT 46.0 47.0*  MCV 89.0  --   PLT 321  --    Basic Metabolic Panel:  Lab Results  Component Value Date   NA 138 19-Jun-2024   K 3.3 (L) 06-19-2024   CO2 29 06-19-2024   GLUCOSE 125 (H) 2024/06/19   BUN 26 (H) 06/19/2024   CREATININE 1.10 (H) 19-Jun-2024   CALCIUM  10.1 06/19/24   GFRNONAA 51 (L) 06-19-24   GFRAA 71 06/21/2019   Lipid Panel:  Lab Results  Component Value Date   LDLCALC 76 07/03/2022   HgbA1c:  Lab Results  Component Value Date   HGBA1C 5.3 12/19/2016   Urine Drug Screen: No  results found for: "LABOPIA", "COCAINSCRNUR", "LABBENZ", "AMPHETMU", "THCU", "LABBARB"  Alcohol  Level     Component Value Date/Time   ETH <15 06/06/2024 1458   INR  Lab Results  Component Value Date   INR 1.0 06/06/2024   APTT  Lab Results  Component Value Date   APTT 27 06/06/2024   AED levels: No results found for: "PHENYTOIN", "ZONISAMIDE", "LAMOTRIGINE", "LEVETIRACETA"  CT Head without contrast(Personally reviewed): Amorphous high-density areas noted in the left frontal region along the cortex and sulci. It is difficult to determine if this is subarachnoid  hemorrhage or within the cortex. 6 mm small hyperdense area adjacent to the right lateral ventricle concerning for small brain lesion. Low-density area in the right occipital lobe could reflect infarct or edema related to underlying lesion.  MRI Brain(Personally reviewed): Leptomeningeal/pial-arachnoid enhancement in the posterior left frontal lobe and high left parietal lobe corresponding to area of abnormality on CT. No definite underlying parenchymal enhancement or edema. 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 Findings most concerning for meningitis and possible focus of encephalitis in the right occipital lobe. Recommend lumbar puncture for further evaluation.  Differential could also include leptomeningeal carcinomatosis. Complex migraine and associated vasospasm could potentially lead to infarct although the degree of enhancement especially in the frontoparietal lobes is atypical.  ASSESSMENT   Gabriela Moyer is a 83 y.o. female with hx of CAD, HTN, HLD, peripheral vascular disease, pafibb not on AC for atleas 5 years due to 2/2 GI bleed, renal artery stenosis, hx of migraines with visual aura who presents with typical migraine headache along with nausea which is also typical of her migraine.  However, in addition, she reports having vertigo on Friday and vision changes that she was noticed to.  Her neuroexam is notable for left hemianopsia, no meningismus, intact cognition and executive function.  MRI of the brain demonstrates multiple abnormalities including a right PCA stroke along with leptomeningeal/pial-arachnoid enhancement in the posterior left frontal lobe and high left parietal lobe corresponding to area of abnormality on CT. In addition, demonstrated diffuse dural enhancement.  The obvious neck step in her workup is to consider lumbar puncture.  However, patient took Eliquis last night and has to have  a 5-day washout before she can get LP.  Differential is broad and includes CNS lymphoma, vasculitis, leptomeningeal carcinomatosis secondary to metastatic solid tumor, neurosarcoidosis, low suspicion for meningitis including bacterial, viral, tuberculous, fungal meningitis given absence of meningismus with intact mentation and cognition.  RECOMMENDATIONS  -Will get stroke workup. -In addition, will get MRI of the C, T, L-spine with and without contrast to evaluate for any leptomeningeal enhancement/carcinomatosis. -Will check ESR, CRP, ANA with IFA, dsDNA, Lyme PCR, RPR, HIV, autoimmune encephalopathy panel, serum ACE, QuantiFERON gold plus.  - I think ultimately the noted leptomeningeal enhancement may need to be to be worked outpatient as it appears to be relatively asymptomatic. However, while we are waiting for the stroke workup to be completed, I think it would still be helpful to work this up. - I would hold off on antiplatelet agents for now given need for LP. ______________________________________________________________________    Signed, Briget Shaheed, MD [facility]

## 2024-06-06 NOTE — ED Notes (Signed)
 C/o much nausea

## 2024-06-06 NOTE — ED Notes (Signed)
 The pts headache is gone sl nausea only now

## 2024-06-06 NOTE — ED Provider Triage Note (Signed)
 Emergency Medicine Provider Triage Evaluation Note  Gabriela Moyer , a 83 y.o. female  was evaluated in triage.  Pt complains of generalized headache for the past 4 days.  Has some nausea but no vomiting.  No chest pain or shortness of breath.  No recent cough or cold symptoms.  She reports that this feels the same strength and positioning as her migraines.  She reports that she is having binocular vision changes where she feels that she is looking out of straws.  This does all happen with her migraines as well.  She reports that she is never had a migraine last this long before.  Has not taking any medication because her visual changes stopped her from being able to look at her medication bottles.  Has not spoken to her neurologist.  Review of Systems  Positive:  Negative:   Physical Exam  BP 129/67 (BP Location: Right Arm)   Pulse (!) 58   Temp 98.1 F (36.7 C)   Resp 16   SpO2 97%  Gen:   Awake, no distress   Resp:  Normal effort  MSK:   Moves extremities without difficulty  Other:  Cranial nerves grossly intact.  Moving all extremities.  Does not appear in any acute distress.  Medical Decision Making  Medically screening exam initiated at 2:28 PM.  Appropriate orders placed.  Holly Lush was informed that the remainder of the evaluation will be completed by another provider, this initial triage assessment does not replace that evaluation, and the importance of remaining in the ED until their evaluation is complete.  Consulted neurology.  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  Recommending MRI of the brain with and without and MRI of the orbits with and without.   Spence Dux, [place] 06/06/24 1547

## 2024-06-06 NOTE — ED Notes (Signed)
 Admitting doctor and neuro is seeing at present

## 2024-06-06 NOTE — ED Notes (Signed)
 The  pt no longer has a visual cut

## 2024-06-07 ENCOUNTER — Inpatient Hospital Stay (HOSPITAL_COMMUNITY)

## 2024-06-07 ENCOUNTER — Other Ambulatory Visit: Payer: Self-pay

## 2024-06-07 DIAGNOSIS — G43909 Migraine, unspecified, not intractable, without status migrainosus: Secondary | ICD-10-CM

## 2024-06-07 DIAGNOSIS — R9089 Other abnormal findings on diagnostic imaging of central nervous system: Secondary | ICD-10-CM

## 2024-06-07 DIAGNOSIS — I776 Arteritis, unspecified: Secondary | ICD-10-CM

## 2024-06-07 DIAGNOSIS — G40901 Epilepsy, unspecified, not intractable, with status epilepticus: Secondary | ICD-10-CM

## 2024-06-07 DIAGNOSIS — R569 Unspecified convulsions: Secondary | ICD-10-CM

## 2024-06-07 DIAGNOSIS — Z8669 Personal history of other diseases of the nervous system and sense organs: Secondary | ICD-10-CM

## 2024-06-07 DIAGNOSIS — G039 Meningitis, unspecified: Secondary | ICD-10-CM

## 2024-06-07 DIAGNOSIS — I639 Cerebral infarction, unspecified: Secondary | ICD-10-CM | POA: Diagnosis not present

## 2024-06-07 DIAGNOSIS — Z8679 Personal history of other diseases of the circulatory system: Secondary | ICD-10-CM | POA: Diagnosis not present

## 2024-06-07 DIAGNOSIS — I10 Essential (primary) hypertension: Secondary | ICD-10-CM | POA: Diagnosis not present

## 2024-06-07 DIAGNOSIS — I63531 Cerebral infarction due to unspecified occlusion or stenosis of right posterior cerebral artery: Secondary | ICD-10-CM

## 2024-06-07 DIAGNOSIS — I6389 Other cerebral infarction: Secondary | ICD-10-CM

## 2024-06-07 DIAGNOSIS — I480 Paroxysmal atrial fibrillation: Secondary | ICD-10-CM

## 2024-06-07 DIAGNOSIS — N179 Acute kidney failure, unspecified: Secondary | ICD-10-CM | POA: Diagnosis not present

## 2024-06-07 LAB — URINALYSIS, ROUTINE W REFLEX MICROSCOPIC
Bilirubin Urine: NEGATIVE
Glucose, UA: NEGATIVE mg/dL
Hgb urine dipstick: NEGATIVE
Ketones, ur: 5 mg/dL — AB
Nitrite: NEGATIVE
Protein, ur: NEGATIVE mg/dL
Specific Gravity, Urine: 1.016 (ref 1.005–1.030)
WBC, UA: >50 WBC/hpf (ref 0–5)
pH: 6 (ref 5.0–8.0)

## 2024-06-07 LAB — C-REACTIVE PROTEIN: CRP: 0.7 mg/dL (ref <1.0)

## 2024-06-07 LAB — LIPID PANEL
Cholesterol: 199 mg/dL (ref 0–200)
HDL: 38 mg/dL — ABNORMAL LOW (ref >40)
LDL Cholesterol: 132 mg/dL — ABNORMAL HIGH (ref 0–99)
Total CHOL/HDL Ratio: 5.2 ratio
Triglycerides: 144 mg/dL (ref <150)
VLDL: 29 mg/dL (ref 0–40)

## 2024-06-07 LAB — COMPREHENSIVE METABOLIC PANEL WITH GFR
ALT: 10 U/L (ref 0–44)
AST: 21 U/L (ref 15–41)
Albumin: 3.1 g/dL — ABNORMAL LOW (ref 3.5–5.0)
Alkaline Phosphatase: 49 U/L (ref 38–126)
Anion gap: 13 (ref 5–15)
BUN: 19 mg/dL (ref 8–23)
CO2: 23 mmol/L (ref 22–32)
Calcium: 9.2 mg/dL (ref 8.9–10.3)
Chloride: 101 mmol/L (ref 98–111)
Creatinine, Ser: 0.9 mg/dL (ref 0.44–1.00)
GFR, Estimated: >60 mL/min (ref >60)
Glucose, Bld: 96 mg/dL (ref 70–99)
Potassium: 3.3 mmol/L — ABNORMAL LOW (ref 3.5–5.1)
Sodium: 137 mmol/L (ref 135–145)
Total Bilirubin: 1 mg/dL (ref 0.0–1.2)
Total Protein: 5.9 g/dL — ABNORMAL LOW (ref 6.5–8.1)

## 2024-06-07 LAB — CBC
HCT: 39.8 % (ref 36.0–46.0)
Hemoglobin: 13.6 g/dL (ref 12.0–15.0)
MCH: 30.8 pg (ref 26.0–34.0)
MCHC: 34.2 g/dL (ref 30.0–36.0)
MCV: 90.2 fL (ref 80.0–100.0)
Platelets: 268 10*3/uL (ref 150–400)
RBC: 4.41 MIL/uL (ref 3.87–5.11)
RDW: 11.9 % (ref 11.5–15.5)
WBC: 9.5 10*3/uL (ref 4.0–10.5)
nRBC: 0 % (ref 0.0–0.2)

## 2024-06-07 LAB — ECHOCARDIOGRAM COMPLETE
Area-P 1/2: 2.71 cm2
Height: 62 in
S' Lateral: 2.4 cm
Weight: 2610.25 [oz_av]

## 2024-06-07 LAB — CREATININE, URINE, RANDOM: Creatinine, Urine: 111 mg/dL

## 2024-06-07 LAB — HIV ANTIBODY (ROUTINE TESTING W REFLEX): HIV Screen 4th Generation wRfx: NONREACTIVE

## 2024-06-07 LAB — SEDIMENTATION RATE: Sed Rate: 9 mm/h (ref 0–22)

## 2024-06-07 LAB — FERRITIN: Ferritin: 58 ng/mL (ref 11–307)

## 2024-06-07 LAB — T4, FREE: Free T4: 1.54 ng/dL — ABNORMAL HIGH (ref 0.61–1.12)

## 2024-06-07 LAB — SODIUM, URINE, RANDOM: Sodium, Ur: 73 mmol/L

## 2024-06-07 LAB — RPR: RPR Ser Ql: NONREACTIVE

## 2024-06-07 LAB — TSH: TSH: 17.16 u[IU]/mL — ABNORMAL HIGH (ref 0.350–4.500)

## 2024-06-07 LAB — CK: Total CK: 52 U/L (ref 38–234)

## 2024-06-07 MED ORDER — LEVETIRACETAM (KEPPRA) 500 MG/5 ML ADULT IV PUSH
1500.0000 mg | Freq: Two times a day (BID) | INTRAVENOUS | Status: DC
  Administered 2024-06-07 – 2024-06-09 (×4): 1500 mg via INTRAVENOUS
  Filled 2024-06-07 (×4): qty 15

## 2024-06-07 MED ORDER — LEVETIRACETAM (KEPPRA) 500 MG/5 ML ADULT IV PUSH
2000.0000 mg | Freq: Once | INTRAVENOUS | Status: AC
  Administered 2024-06-07: 2000 mg via INTRAVENOUS

## 2024-06-07 MED ORDER — GADOBUTROL 1 MMOL/ML IV SOLN
7.0000 mL | Freq: Once | INTRAVENOUS | Status: AC | PRN
  Administered 2024-06-07: 7 mL via INTRAVENOUS

## 2024-06-07 MED ORDER — LEVOTHYROXINE SODIUM 25 MCG PO TABS
25.0000 ug | ORAL_TABLET | Freq: Every day | ORAL | Status: DC
  Administered 2024-06-07 – 2024-06-12 (×6): 25 ug via ORAL
  Filled 2024-06-07 (×6): qty 1

## 2024-06-07 MED ORDER — SODIUM CHLORIDE 0.9 % IV BOLUS: 500.0000 mL | Freq: Once | INTRAVENOUS | Status: DC

## 2024-06-07 MED ORDER — LEVETIRACETAM (KEPPRA) 500 MG/5 ML ADULT IV PUSH: 500.0000 mg | Freq: Two times a day (BID) | INTRAVENOUS | Status: DC

## 2024-06-07 MED ORDER — VALPROATE SODIUM 100 MG/ML IV SOLN
1500.0000 mg | Freq: Once | INTRAVENOUS | Status: AC
  Administered 2024-06-07: 1500 mg via INTRAVENOUS
  Filled 2024-06-07: qty 15

## 2024-06-07 MED ORDER — ASPIRIN 81 MG PO TBEC
81.0000 mg | DELAYED_RELEASE_TABLET | Freq: Every day | ORAL | Status: DC
  Administered 2024-06-07 – 2024-06-13 (×7): 81 mg via ORAL
  Filled 2024-06-07 (×7): qty 1

## 2024-06-07 MED ORDER — ATORVASTATIN CALCIUM 40 MG PO TABS
40.0000 mg | ORAL_TABLET | Freq: Every day | ORAL | Status: DC
  Administered 2024-06-07 – 2024-06-24 (×15): 40 mg via ORAL
  Filled 2024-06-07 (×15): qty 1

## 2024-06-07 NOTE — ED Notes (Signed)
 Went to MRI

## 2024-06-07 NOTE — Progress Notes (Signed)
 EEG equipment transferred to [phone] with patient. [facility] now monitoring.

## 2024-06-07 NOTE — Progress Notes (Signed)
LTM EEG hooked up and running - no initial skin breakdown - push button tested - No [facility]. Patient in the ED

## 2024-06-07 NOTE — ED Notes (Signed)
 Iv stopped momentarily t attempt to draw these 20 plus labs requested

## 2024-06-07 NOTE — Plan of Care (Signed)
 EEG with electrographic status epilepticus. Ordered VPA load. Check VPA level 1h after load. Will follow  -- Tona Francis, MD Neurologist [facility]

## 2024-06-07 NOTE — ED Notes (Addendum)
 Attending Sreeram MD notified of hypotension. Bolus ordered.

## 2024-06-07 NOTE — Procedures (Signed)
 Patient Name: Gabriela Moyer  MRN: [id number]  Epilepsy Attending: Arleene Lack  Referring Physician/Provider: Khaliqdina, Salman, MD  Date: 06/07/2024 Duration: 22.30 mins  Patient history: 83 y.o. female with visual disturbance. EEG to evaluate for seizure.   Level of alertness: Awake  AEDs during EEG study: None  Technical aspects: This EEG study was done with scalp electrodes positioned according to the 10-20 International system of electrode placement. Electrical activity was reviewed with band pass filter of 1-70Hz , sensitivity of 7 uV/mm, display speed of 45mm/sec with a 60Hz  notched filter applied as appropriate. EEG data were recorded continuously and digitally stored.  Video monitoring was available and reviewed as appropriate.  Description: The posterior dominant rhythm consists of 8-9 Hz activity of moderate voltage (25-35 uV) seen predominantly in posterior head regions, asymmetric ( right<left) and reactive to eye opening and eye closing. Hyperventilation and photic stimulation were not performed.     2 seizures were noted during the study at 0825 and 0833.  During the seizure, no clinical signs were noted.  EEG showed 12 to 13 Hz sharply contoured beta activity in right occipital region which then involve left occipital region and evolved into high amplitude sharply contoured 5 to 6 Hz theta slowing followed by 2 to 3 Hz delta slowing.  Duration of seizures was about 1 minute 10 seconds each  ABNORMALITY - Focal seizure, right occipital region - Background asymmetry, right<left  IMPRESSION: This study showed 2 focal seizures without clinical signs arising from right occipital region at 0825 and 0833 lasting for about 1 minute 10 seconds each. Additionally there was evidence of cortical dysfunction in right occipital region likely secondary to underlying structural abnormality.  Dr. Lindzen was notified.   Shantea Poulton O Tyrian Peart

## 2024-06-07 NOTE — TOC CAGE-AID Note (Signed)
 Transition of Care [facility]) - CAGE-AID Screening   Patient Details  Name: Gabriela Moyer MRN: [id number] Date of Birth: 03-01-1941  Transition of Care [facility] CM/SW Contact:    Bert Ptacek E Renisha Cockrum, LCSW Phone Number: 06/07/2024, 11:15 AM   Clinical Narrative:    CAGE-AID Screening:    Have You Ever Felt You Ought to Cut Down on Your Drinking or Drug Use?: No Have People Annoyed You By [employer] Your Drinking Or Drug Use?: No Have You Felt Bad Or Guilty About Your Drinking Or Drug Use?: No Have You Ever Had a Drink or Used Drugs First Thing In The Morning to Steady Your Nerves or to Get Rid of a Hangover?: No CAGE-AID Score: 0  Substance Abuse Education Offered: No

## 2024-06-07 NOTE — Progress Notes (Signed)
  Echocardiogram 2D Echocardiogram has been attempted, pt getting ECG.   Fain Home RDCS 06/07/2024, 8:22 AM

## 2024-06-07 NOTE — Evaluation (Signed)
 Physical Therapy Evaluation Patient Details Name: Gabriela Moyer MRN: [id number] DOB: 09-10-1941 Today's Date: 06/07/2024  History of Present Illness  Pt is an 83 y/o F admitted on 06/06/24 after presenting with c/o HA x 4 days, as well as vertigo & vision changes.  MRI brain w + w /o C which demonstrated leptomeningeal/pial-arachnoid enhancement in the posterior left frontal lobe and high left parietal lobe corresponding to area of abnormality on CT. 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 PMH: chronic migraine, HTN, HLD, extensive PAD of subclavian, axillary, brachial & renal artery s/p R renal artery stent 2010, hypothyroidism, paroxysmal a-fib  Clinical Impression  Pt seen for PT evaluation with pt agreeable to tx. Pt reports prior to admission she was independent without AD, driving, caring for her 92 y/o dog. On this date, pt endorses dizziness that slightly worsens with gait, as well as ongoing impaired vision. Pt is able to complete bed mobility with mod I, sit<>stand with CGA 2/2 posterior lean, & ambulate without AD with CGA 2/2 decreased balance. Pt would benefit from ongoing acute PT services to progress mobility (balance, strengthening, gait with LRAD) to reduce fall risk.  Pt noted to have drop in BP before & after gait, MD made aware.        If plan is discharge home, recommend the following: A little help with walking and/or transfers;A little help with bathing/dressing/bathroom;Assistance with cooking/housework;Assist for transportation;Help with stairs or ramp for entrance   Can travel by private vehicle        Equipment Recommendations Rolling walker (2 wheels);BSC/3in1  Recommendations for Other Services       Functional Status Assessment Patient has had a recent decline in their functional status and demonstrates the ability to make significant improvements in function in a reasonable and predictable amount of time.     Precautions /  Restrictions Precautions Precautions: Fall Restrictions Weight Bearing Restrictions Per Provider Order: No      Mobility  Bed Mobility Overal bed mobility: Needs Assistance Bed Mobility: Supine to Sit, Sit to Supine     Supine to sit: Modified independent (Device/Increase time), HOB elevated Sit to supine: Modified independent (Device/Increase time), HOB elevated        Transfers Overall transfer level: Needs assistance Equipment used: None Transfers: Sit to/from Stand Sit to Stand: Contact guard assist           General transfer comment: posterior lean during sit>stand from EOB    Ambulation/Gait Ambulation/Gait assistance: Contact guard assist Gait Distance (Feet): 100 Feet Assistive device: None Gait Pattern/deviations: Decreased step length - right, Festinating, Decreased stride length Gait velocity: decreased     General Gait Details: Pt with downward gaze, CGA 2/2 lateral sway L<>R, decreased balance.  Stairs            Wheelchair Mobility     Tilt Bed    Modified Rankin (Stroke Patients Only)       Balance Overall balance assessment: Needs assistance Sitting-balance support: Feet supported Sitting balance-Leahy Scale: Fair     Standing balance support: During functional activity, No upper extremity supported Standing balance-Leahy Scale: Fair                               Pertinent Vitals/Pain Pain Assessment Pain Assessment: No/denies pain    Home Living Family/patient expects to be discharged to:: Private residence Living Arrangements: Alone Available Help at Discharge:  (reports she  has family/friends that could provide as much as 24 hr assist if needed) Type of Home: House Home Access: Stairs to enter Entrance Stairs-Rails: None Entrance Stairs-Number of Steps: 1   Home Layout: One level Home Equipment: Tub bench      Prior Function Prior Level of Function : Driving             Mobility Comments:  Independent without AD, driving, denies falls in the past 6 months. ADLs Comments: Bathes & dresses herself, grocery shops. Has a 84 y/o Goldendoodle named "Georg[place] ".     Extremity/Trunk Assessment   Upper Extremity Assessment Upper Extremity Assessment: Overall WFL for tasks assessed;Right hand dominant    Lower Extremity Assessment Lower Extremity Assessment: Generalized weakness (BLE sensation (to light touch) & proprioception intact)       Communication   Communication Communication: No apparent difficulties    Cognition Arousal: Alert Behavior During Therapy: WFL for tasks assessed/performed                             Following commands: Intact       Cueing Cueing Techniques: Verbal cues     General Comments General comments (skin integrity, edema, etc.): BP sitting EOB at beginning of session: 127/74 (90), sitting EOB after gait 91/73 (78) & endorsed feeling a little more nauseous.    Exercises     Assessment/Plan    PT Assessment Patient needs continued PT services  PT Problem List Decreased strength;Decreased mobility;Decreased balance;Decreased activity tolerance;Decreased knowledge of use of DME       PT Treatment Interventions Modalities;Balance training;DME instruction;Gait training;Neuromuscular re-education;Stair training;Patient/family education;Functional mobility training;Therapeutic activities;Therapeutic exercise    PT Goals (Current goals can be found in the Care Plan section)  Acute Rehab PT Goals Patient Stated Goal: get better PT Goal Formulation: With patient Time For Goal Achievement: 06/21/24 Potential to Achieve Goals: Good    Frequency Min 3X/week     Co-evaluation               AM-PAC PT "6 Clicks" Mobility  Outcome Measure Help needed turning from your back to your side while in a flat bed without using bedrails?: None Help needed moving from lying on your back to sitting on the side of a flat bed without  using bedrails?: None Help needed moving to and from a bed to a chair (including a wheelchair)?: A Little Help needed standing up from a chair using your arms (e.g., wheelchair or bedside chair)?: A Little Help needed to walk in hospital room?: A Little Help needed climbing 3-5 steps with a railing? : A Little 6 Click Score: 20    End of Session   Activity Tolerance: Patient tolerated treatment well Patient left: in bed;with call bell/phone within reach Nurse Communication: Mobility status PT Visit Diagnosis: Unsteadiness on feet (R26.81);Difficulty in walking, not elsewhere classified (R26.2)    Time: 1610-9604 PT Time Calculation (min) (ACUTE ONLY): 11 min   Charges:   PT Evaluation $PT Eval Low Complexity: 1 Low   PT General Charges $$ ACUTE PT VISIT: 1 Visit         Emaline Handsome, PT, DPT 06/07/24, 10:53 AM   Venetta Gill 06/07/2024, 10:51 AM

## 2024-06-07 NOTE — Progress Notes (Signed)
 EEG complete - results pending

## 2024-06-07 NOTE — Progress Notes (Signed)
 Progress Note   Patient: Gabriela Moyer ZOX:[id number] DOB: [birth year] DOA: 06/06/2024     1 DOS: the patient was seen and examined on 06/07/2024   Brief hospital course: Gabriela Moyer is a 83 y.o. female with medical history significant of chronic migraine, essential hypertension, hyperlipidemia, extensive  PAD of subclavian, axillary, brachial and renal artery status post right renal artery stent 2010, hypothyroidism and paroxysmal afibrillation not on Xarelto  due to history of GI bleed recent emergency department complaining of headache for 4 days which is right-sided with associated vertigo, some vision change with the onset of the headache.   CT head: Amorphous high-density areas noted in the left frontal region along the cortex and sulci. It is difficult to determine if this is subarachnoid hemorrhage or within the cortex.   6 mm small hyperdense area adjacent to the right lateral ventricle concerning for small brain lesion.   Low-density area in the right occipital lobe could reflect infarct or edema related to underlying lesion.   MRI brain showed: Leptomeningeal/pial-arachnoid enhancement in the posterior left frontal lobe and high left parietal lobe corresponding to area of abnormality on CT. No definite underlying parenchymal enhancement or edema. 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 Findings most concerning for meningitis and possible focus of encephalitis in the right occipital lobe. Recommend lumbar puncture for further evaluation.   Differential could also include leptomeningeal carcinomatosis. Complex migraine and associated vasospasm could potentially lead to infarct although the degree of enhancement especially in the frontoparietal lobes is atypical.   Diffuse dural enhancement noted.   Nodule along the surface of the right lateral ventricle with signal intensity similar to cortical  gray matter without associated enhancement, suggestive of gray matter heterotopia.   Unremarkable MRI of the orbits.  No findings of optic neuritis.  Patient is admitted to [facility] service for neurology evaluation and further stroke work up.  Assessment and Plan: Right sided vision deficit. Nausea and dizziness Reviewed imaging studies.  Neurology recommended stroke work up. Continue neurochecks. A1c pending. Echo pending, LDL 132. EEG showed focal seizure right occipital region. Hold Plavix  as she will need LP for further evaluation of her MRI findings. Further autoimmune work up per neurology. PT/ OT evaluation.  Hypokalemia: Oral and IV potassium supplements ordered. Continue to monitor and replete as needed.  Hypothyroidism: Home dose synthroid  resumed.  Acute migraine headache with associated nausea Continue Fioricet as needed. QTc 487  Continue Tigan as needed.  Hypotension. Orthostatic vitals positive. Hold antihypertensives. She got IV fluid boluses, continue gentle IV fluids. Hold toprol  as she has bradycardia. Monitor her vitals closely.    Out of bed to chair. Incentive spirometry. Nursing supportive care. Fall, aspiration precautions. Diet:  Diet Orders (From admission, onward)     Start     Ordered   06/06/24 2138  Diet Heart Fluid consistency: Thin  Diet effective now       Question:  Fluid consistency:  Answer:  Thin   06/06/24 2137           DVT prophylaxis:   Level of care: Telemetry Medical   Code Status: Full Code  Subjective: Patient is seen and examined today morning. She feels on and off dizzy. No headache. Has right side vision abnormality. Sister at bedside.  Physical Exam: Vitals:   06/07/24 1030 06/07/24 1045 06/07/24 1215 06/07/24 1240  BP: (!) 130/101 100/66 97/81 97/81   Pulse: 65 62 (!) 58 69  Resp: 18 16  20 20  Temp:    98.2 F (36.8 C)  TempSrc:      SpO2: 99% 99% 99% 100%  Weight:      Height:        General -  Elderly ill Caucasian female, distress due to dizziness HEENT - PERRLA, EOMI, atraumatic head, non tender sinuses. Lung - Clear, basal rales, rhonchi, no wheezes. Heart - S1, S2 heard, no murmurs, rubs, trace pedal edema. Abdomen - Soft, non tender, bowel sounds good Neuro - Alert, awake and oriented, right visual field cut. Skin - Warm and dry.  Data Reviewed:      Latest Ref Rng & Units 06/07/2024    3:47 AM 06/06/2024    3:03 PM 06/06/2024    2:58 PM  CBC  WBC 4.0 - 10.5 K/uL 9.5   11.2   Hemoglobin 12.0 - 15.0 g/dL 04.5  40.9  81.1   Hematocrit 36.0 - 46.0 % 39.8  47.0  46.0   Platelets 150 - 400 K/uL 268   321       Latest Ref Rng & Units 06/07/2024    3:47 AM 06/06/2024    3:03 PM 06/06/2024    2:58 PM  BMP  Glucose 70 - 99 mg/dL 96  914  782   BUN 8 - 23 mg/dL 19  26  22    Creatinine 0.44 - 1.00 mg/dL 9.56  2.13  0.86   Sodium 135 - 145 mmol/L 137  138  138   Potassium 3.5 - 5.1 mmol/L 3.3  3.3  3.3   Chloride 98 - 111 mmol/L 101  96  96   CO2 22 - 32 mmol/L 23   29   Calcium  8.9 - 10.3 mg/dL 9.2   57.8    ECHOCARDIOGRAM COMPLETE Result Date: 06/07/2024    ECHOCARDIOGRAM REPORT   Patient Name:   Gabriela Moyer Date of Exam: 06/07/2024 Medical Rec #:  [id number]      Height:       62.0 in Accession #:    [id number]     Weight:       163.1 lb Date of Birth:  [birth year]      BSA:          1.753 m Patient Age:    83 years       BP:           104/68 mmHg Patient Gender: F              HR:           62 bpm. Exam Location:  Inpatient Procedure: 2D Echo, Cardiac Doppler, Color Doppler and Saline Contrast Bubble            Study (Both Spectral and Color Flow Doppler were utilized during            procedure). Indications:    Stroke I63.9  History:        Patient has prior history of Echocardiogram examinations, most                 recent 12/18/2016.  Sonographer:    Gabriela Moyer RDCS Referring Phys: [phone] Gabriela Moyer IMPRESSIONS  1. Left ventricular ejection fraction, by estimation, is 55  to 60%. The left ventricle has normal function. The left ventricle has no regional wall motion abnormalities. Left ventricular diastolic parameters are consistent with Grade II diastolic dysfunction (pseudonormalization).  2. Right ventricular systolic function is normal. The right ventricular size is normal.  Tricuspid regurgitation signal is inadequate for assessing PA pressure.  3. The mitral valve is normal in structure. No evidence of mitral valve regurgitation. No evidence of mitral stenosis.  4. The aortic valve is tricuspid. There is mild calcification of the aortic valve. Aortic valve regurgitation is mild. No aortic stenosis is present.  5. The inferior vena cava is normal in size with greater than 50% respiratory variability, suggesting right atrial pressure of 3 mmHg.  6. Agitated saline contrast bubble study was negative, with no evidence of any interatrial shunt. FINDINGS  Left Ventricle: Left ventricular ejection fraction, by estimation, is 55 to 60%. The left ventricle has normal function. The left ventricle has no regional wall motion abnormalities. The left ventricular internal cavity size was normal in size. There is  no left ventricular hypertrophy. Left ventricular diastolic parameters are consistent with Grade II diastolic dysfunction (pseudonormalization). Right Ventricle: The right ventricular size is normal. No increase in right ventricular wall thickness. Right ventricular systolic function is normal. Tricuspid regurgitation signal is inadequate for assessing PA pressure. Left Atrium: Left atrial size was normal in size. Right Atrium: Right atrial size was normal in size. Pericardium: There is no evidence of pericardial effusion. Mitral Valve: The mitral valve is normal in structure. Mild mitral annular calcification. No evidence of mitral valve regurgitation. No evidence of mitral valve stenosis. Tricuspid Valve: The tricuspid valve is normal in structure. Tricuspid valve regurgitation is  trivial. No evidence of tricuspid stenosis. Aortic Valve: The aortic valve is tricuspid. There is mild calcification of the aortic valve. Aortic valve regurgitation is mild. No aortic stenosis is present. Pulmonic Valve: The pulmonic valve was normal in structure. Pulmonic valve regurgitation is not visualized. No evidence of pulmonic stenosis. Aorta: The aortic root is normal in size and structure. Venous: The inferior vena cava is normal in size with greater than 50% respiratory variability, suggesting right atrial pressure of 3 mmHg. IAS/Shunts: No atrial level shunt detected by color flow Doppler. Agitated saline contrast was given intravenously to evaluate for intracardiac shunting. Agitated saline contrast bubble study was negative, with no evidence of any interatrial shunt.  LEFT VENTRICLE PLAX 2D LVIDd:         3.40 cm   Diastology LVIDs:         2.40 cm   LV e' medial:    3.70 cm/s LV PW:         1.20 cm   LV E/e' medial:  21.6 LV IVS:        1.20 cm   LV e' lateral:   4.46 cm/s LVOT diam:     1.70 cm   LV E/e' lateral: 17.9 LV SV:         75 LV SV Index:   43 LVOT Area:     2.27 cm  RIGHT VENTRICLE         IVC TAPSE (M-mode): 2.2 cm  IVC diam: 1.80 cm LEFT ATRIUM             Index        RIGHT ATRIUM          Index LA diam:        3.00 cm 1.71 cm/m   RA Area:     9.57 cm LA Vol (A2C):   40.4 ml 23.04 ml/m  RA Volume:   17.30 ml 9.87 ml/m LA Vol (A4C):   49.9 ml 28.46 ml/m LA Biplane Vol: 48.0 ml 27.38 ml/m  AORTIC VALVE LVOT Vmax:  124.00 cm/s LVOT Vmean:  89.900 cm/s LVOT VTI:    0.332 m  AORTA Ao Root diam: 2.40 cm Ao Asc diam:  3.20 cm MITRAL VALVE MV Area (PHT): 2.71 cm     SHUNTS MV Decel Time: 280 msec     Systemic VTI:  0.33 m MV E velocity: 79.90 cm/s   Systemic Diam: 1.70 cm MV A velocity: 134.00 cm/s MV E/A ratio:  0.60 Arta Lark Electronically signed by Arta Lark Signature Date/Time: 06/07/2024/2:00:24 PM    Final    EEG adult Result Date: 06/07/2024 Arleene Lack, MD      06/07/2024 10:17 AM Patient Name: FRIMET DURFEE MRN: [id number] Epilepsy Attending: Arleene Lack Referring Physician/Provider: Khaliqdina, Salman, MD Date: 06/07/2024 Duration: 22.30 mins Patient history: 83 y.o. female with visual disturbance. EEG to evaluate for seizure. Level of alertness: Awake AEDs during EEG study: None Technical aspects: This EEG study was done with scalp electrodes positioned according to the 10-20 International system of electrode placement. Electrical activity was reviewed with band pass filter of 1-70Hz , sensitivity of 7 uV/mm, display speed of 78mm/sec with a 60Hz  notched filter applied as appropriate. EEG data were recorded continuously and digitally stored.  Video monitoring was available and reviewed as appropriate. Description: The posterior dominant rhythm consists of 8-9 Hz activity of moderate voltage (25-35 uV) seen predominantly in posterior head regions, asymmetric ( right<left) and reactive to eye opening and eye closing. Hyperventilation and photic stimulation were not performed.   2 seizures were noted during the study at 0825 and 0833.  During the seizure, no clinical signs were noted.  EEG showed 12 to 13 Hz sharply contoured beta activity in right occipital region which then involve left occipital region and evolved into high amplitude sharply contoured 5 to 6 Hz theta slowing followed by 2 to 3 Hz delta slowing.  Duration of seizures was about 1 minute 10 seconds each ABNORMALITY - Focal seizure, right occipital region - Background asymmetry, right<left IMPRESSION: This study showed 2 focal seizures without clinical signs arising from right occipital region at 0825 and 0833 lasting for about 1 minute 10 seconds each. Additionally there was evidence of cortical dysfunction in right occipital region likely secondary to underlying structural abnormality. Dr. Lindzen was notified. Priyanka O Yadav   MR Brain W and Wo Contrast Result Date: 06/06/2024 CLINICAL DATA:   Headache, new onset. Concern for optic neuritis, suspected binocular vision loss. History of complex migraines. EXAM: MRI HEAD AND ORBITS WITHOUT AND WITH CONTRAST TECHNIQUE: Multiplanar, multiecho pulse sequences of the brain and surrounding structures were obtained without and with intravenous contrast. Multiplanar, multiecho pulse sequences of the orbits and surrounding structures were obtained including fat saturation techniques, before and after intravenous contrast administration. CONTRAST:  7mL GADAVIST GADOBUTROL 1 MMOL/ML IV SOLN COMPARISON:  Earlier same day CT head. FINDINGS: MRI HEAD FINDINGS Brain: There is restricted diffusion involving the cortex and subcortical white matter in the right occipital lobe with mild associated edema and sulcal effacement. There is leptomeningeal/pial-arachnoid enhancement within this region. Additional areas of enhancement along the sulci in the posterior left frontal lobe and high left parietal lobe corresponding to areas of abnormality on same day CT concerning for additional leptomeningeal/pial-arachnoid enhancement. There is abnormal sulcal FLAIR hyperintensity in this region without evidence of parenchymal edema or enhancement. Additional diffuse dural enhancement noted. T2/FLAIR hyperintensity in the periventricular and subcortical white matter. No evidence of intracranial hemorrhage. No midline shift. The basilar cisterns are patent. No extra-axial fluid  collections. Ventricles: Normal size and configuration of the ventricles. Mildly T1 hypointense focus along the ependymal surface of the right lateral ventricle with signal similar to cortical gray matter which may reflect focus of heterotopic gray matter. No associated enhancement. Vascular: Skull base flow voids are visualized. Skull and upper cervical spine: No focal abnormality. Other: Mastoid air cells are clear. MRI ORBITS FINDINGS Orbits: No traumatic or inflammatory finding. Globes, optic nerves, orbital  fat, extraocular muscles, vascular structures, and lacrimal glands are normal. There is no edema or enlargement of the optic nerves or abnormal enhancement to suggest optic neuritis. Visualized sinuses: Clear. Soft tissues: Negative. IMPRESSION: Leptomeningeal/pial-arachnoid enhancement in the posterior left frontal lobe and high left parietal lobe corresponding to area of abnormality on CT. No definite underlying parenchymal enhancement or edema. 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 Findings most concerning for meningitis and possible focus of encephalitis in the right occipital lobe. Recommend lumbar puncture for further evaluation. Differential could also include leptomeningeal carcinomatosis. Complex migraine and associated vasospasm could potentially lead to infarct although the degree of enhancement especially in the frontoparietal lobes is atypical. Diffuse dural enhancement noted. Nodule along the surface of the right lateral ventricle with signal intensity similar to cortical gray matter without associated enhancement, suggestive of gray matter heterotopia. Unremarkable MRI of the orbits.  No findings of optic neuritis. Electronically Signed   By: Denny Flack M.D.   On: 06/06/2024 17:44   MR ORBITS W WO CONTRAST Result Date: 06/06/2024 CLINICAL DATA:  Headache, new onset. Concern for optic neuritis, suspected binocular vision loss. History of complex migraines. EXAM: MRI HEAD AND ORBITS WITHOUT AND WITH CONTRAST TECHNIQUE: Multiplanar, multiecho pulse sequences of the brain and surrounding structures were obtained without and with intravenous contrast. Multiplanar, multiecho pulse sequences of the orbits and surrounding structures were obtained including fat saturation techniques, before and after intravenous contrast administration. CONTRAST:  7mL GADAVIST GADOBUTROL 1 MMOL/ML IV SOLN COMPARISON:  Earlier same day CT head.  FINDINGS: MRI HEAD FINDINGS Brain: There is restricted diffusion involving the cortex and subcortical white matter in the right occipital lobe with mild associated edema and sulcal effacement. There is leptomeningeal/pial-arachnoid enhancement within this region. Additional areas of enhancement along the sulci in the posterior left frontal lobe and high left parietal lobe corresponding to areas of abnormality on same day CT concerning for additional leptomeningeal/pial-arachnoid enhancement. There is abnormal sulcal FLAIR hyperintensity in this region without evidence of parenchymal edema or enhancement. Additional diffuse dural enhancement noted. T2/FLAIR hyperintensity in the periventricular and subcortical white matter. No evidence of intracranial hemorrhage. No midline shift. The basilar cisterns are patent. No extra-axial fluid collections. Ventricles: Normal size and configuration of the ventricles. Mildly T1 hypointense focus along the ependymal surface of the right lateral ventricle with signal similar to cortical gray matter which may reflect focus of heterotopic gray matter. No associated enhancement. Vascular: Skull base flow voids are visualized. Skull and upper cervical spine: No focal abnormality. Other: Mastoid air cells are clear. MRI ORBITS FINDINGS Orbits: No traumatic or inflammatory finding. Globes, optic nerves, orbital fat, extraocular muscles, vascular structures, and lacrimal glands are normal. There is no edema or enlargement of the optic nerves or abnormal enhancement to suggest optic neuritis. Visualized sinuses: Clear. Soft tissues: Negative. IMPRESSION: Leptomeningeal/pial-arachnoid enhancement in the posterior left frontal lobe and high left parietal lobe corresponding to area of abnormality on CT. No definite underlying parenchymal enhancement or edema. Additional region of infarct in the  right occipital lobe with associated leptomeningeal/pial-arachnoid enhancement. Findings most  concerning for meningitis and possible focus of encephalitis in the right occipital lobe. Recommend lumbar puncture for further evaluation. Differential could also include leptomeningeal carcinomatosis. Complex migraine and associated vasospasm could potentially lead to infarct although the degree of enhancement especially in the frontoparietal lobes is atypical. Diffuse dural enhancement noted. Nodule along the surface of the right lateral ventricle with signal intensity similar to cortical gray matter without associated enhancement, suggestive of gray matter heterotopia. Unremarkable MRI of the orbits.  No findings of optic neuritis. Electronically Signed   By: Denny Flack M.D.   On: 06/06/2024 17:44   CT Head Wo Contrast Result Date: 06/06/2024 CLINICAL DATA:  Headache EXAM: CT HEAD WITHOUT CONTRAST TECHNIQUE: Contiguous axial images were obtained from the base of the skull through the vertex without intravenous contrast. RADIATION DOSE REDUCTION: This exam was performed according to the departmental dose-optimization program which includes automated exposure control, adjustment of the mA and/or kV according to patient size and/or use of iterative reconstruction technique. COMPARISON:  06/19/2019 FINDINGS: Brain: Areas of high density in left frontal region. It is difficult to determine if this is subarachnoid hemorrhage or within the cortex. Low-density area in the right occipital lobe. This could reflect infarct, or edema related to an underlying lesion. Small high-density 6 mm area adjacent to the right lateral ventricle concerning for brain lesion. No hydrocephalus. Vascular: No hyperdense vessel or unexpected calcification. Skull: No acute calvarial abnormality. Sinuses/Orbits: No acute findings Other: None IMPRESSION: Amorphous high-density areas noted in the left frontal region along the cortex and sulci. It is difficult to determine if this is subarachnoid hemorrhage or within the cortex. 6 mm small  hyperdense area adjacent to the right lateral ventricle concerning for small brain lesion. Low-density area in the right occipital lobe could reflect infarct or edema related to underlying lesion. Recommend MRI without and with contrast for further evaluation. Electronically Signed   By: Janeece Mechanic M.D.   On: 06/06/2024 16:15    Family Communication: Discussed with patient, sister at bedside. They understand and agree. All questions answered.  Disposition: Status is: Inpatient Remains inpatient appropriate because: neurology work up, low BP, orthostatic  Planned Discharge Destination: Home with Home Health     Time spent: 42 minutes  Author: Aisha Hove, MD 06/07/2024 2:10 PM Secure chat 7am to 7pm For on call review [email or web address].

## 2024-06-07 NOTE — ED Notes (Signed)
 Urine sent

## 2024-06-07 NOTE — Progress Notes (Signed)
 NEUROLOGY CONSULT FOLLOW UP NOTE   Date of service: June 07, 2024 Patient Name: Gabriela Moyer MRN:  [id number] DOB:  06-Jul-1941  Interval Hx/subjective   Seen in ED with daughter in law at the bedside. She is still having some dizziness/vertigo sensation. Does not report in memory lapses. LTM is being connected. She states that she has not been on eliquis for many years. She does not take xarelto  either. She is currently on plavix  but has not taken any meds since Friday due to her dizziness/vision changes. She states that Saturday and Sunday she could not read the labels on her medication bottles so she did not take anything.   Vitals   Vitals:   06/07/24 0715 06/07/24 0800 06/07/24 0915 06/07/24 0945  BP: (!) 105/50 120/74  104/68  Pulse: (!) 57 61  67  Resp: 18 14  17   Temp:   98.5 F (36.9 C)   TempSrc:   Oral   SpO2: 97% 97%  98%  Weight:      Height:         Body mass index is 29.84 kg/m.  Physical Exam   Constitutional: Appears well-developed and well-nourished.  Psych: Affect appropriate to situation.  Eyes: No scleral injection.  HENT: No OP obstrucion.  Head: Normocephalic.  Cardiovascular: Normal rate and regular rhythm.  Respiratory: Effort normal, non-labored breathing.  GI: Soft.  No distension. There is no tenderness.  Skin: WDI.   Neurologic Examination   Mental Status: Patient is awake, alert, oriented to person, place, month, year, and situation. Patient is able to give a clear and coherent history. No signs of aphasia or neglect Cranial Nerves: II: Left hemianopsia. (Patient endorses that this fluctuating) Pupils are equal, round, and reactive to light.   III,IV, VI: Gaze is slightly disconjugate, nystagmus with left gaze V: Facial sensation is symmetric to temperature VII: Facial movement is symmetric resting and smiling VIII: Hearing is intact to voice X: Palate elevates symmetrically XI: Shoulder shrug is symmetric. XII: Tongue protrudes  midline without atrophy or fasciculations.  Motor: Tone is normal. Bulk is normal. 5/5 strength was present in all four extremities.  Sensory: Sensation is symmetric to light touch and temperature in the arms and legs.  Cerebellar: FNF and HKS are intact bilaterally   Medications  Current Facility-Administered Medications:    acetaminophen  (TYLENOL ) tablet 650 mg, 650 mg, Oral, Q4H PRN **OR** acetaminophen  (TYLENOL ) 160 MG/5ML solution 650 mg, 650 mg, Per Tube, Q4H PRN **OR** acetaminophen  (TYLENOL ) suppository 650 mg, 650 mg, Rectal, Q4H PRN, Gabriela Moyer, Subrina, MD   aspirin EC tablet 81 mg, 81 mg, Oral, Daily, Gabriela Denmark, MD, 81 mg at 06/07/24 1012   atorvastatin  (LIPITOR) tablet 40 mg, 40 mg, Oral, Daily, Gabriela Denmark, MD, 40 mg at 06/07/24 1012   butalbital-acetaminophen -caffeine (FIORICET) 50-325-40 MG per tablet 1 tablet, 1 tablet, Oral, Q6H PRN, Gabriela Moyer, Subrina, MD   diphenhydrAMINE (BENADRYL) injection 50 mg, 50 mg, Intravenous, Q6H PRN, Gabriela Moyer, Subrina, MD   lactated ringers infusion, , Intravenous, Continuous, Gabriela Moyer, Subrina, MD, Last Rate: 150 mL/hr at 06/07/24 0103, New Bag at 06/07/24 0103   levETIRAcetam (KEPPRA) undiluted injection 2,000 mg, 2,000 mg, Intravenous, Once, Gabriela Peatross, MD   levETIRAcetam (KEPPRA) undiluted injection 500 mg, 500 mg, Intravenous, Q12H, Gabriela Engelhard, MD   senna-docusate (Senokot-S) tablet 1 tablet, 1 tablet, Oral, QHS PRN, Gabriela Moyer, Subrina, MD   sodium chloride  flush (NS) 0.9 % injection 3-10 mL, 3-10 mL, Intravenous, Q12H, Gabriela Moyer, Subrina, MD, 3 mL at  06/07/24 1014   sodium chloride  flush (NS) 0.9 % injection 3-10 mL, 3-10 mL, Intravenous, PRN, Gabriela Moyer, Subrina, MD   trimethobenzamide Gabriela Moyer) injection 200 mg, 200 mg, Intramuscular, Q6H PRN, Gabriela Moyer, Subrina, MD  Current Outpatient Medications:    amLODipine  (NORVASC ) 5 MG tablet, TAKE 1 TABLET BY MOUTH DAILY, Disp: 90 tablet, Rfl: 1   clopidogrel  (PLAVIX ) 75 MG tablet, TAKE 1 TABLET BY MOUTH  DAILY, Disp: 90 tablet, Rfl: 0   hydrochlorothiazide  (HYDRODIURIL ) 25 MG tablet, TAKE 1 TABLET(25 MG) BY MOUTH DAILY, Disp: 90 tablet, Rfl: 1   levothyroxine  (SYNTHROID , LEVOTHROID) 25 MCG tablet, Take 1 tablet by mouth daily., Disp: , Rfl: 0   metoprolol  succinate (TOPROL -XL) 100 MG 24 hr tablet, TAKE 1 TABLET(100 MG) BY MOUTH DAILY (Patient taking differently: Take 50 mg by mouth in the morning and at bedtime.), Disp: 90 tablet, Rfl: 3   rosuvastatin (CRESTOR) 5 MG tablet, Take 5 mg by mouth daily as needed., Disp: , Rfl:    ezetimibe  (ZETIA ) 10 MG tablet, TAKE 1 TABLET(10 MG) BY MOUTH DAILY (Patient not taking: Reported on 06/06/2024), Disp: 90 tablet, Rfl: 3   metoprolol  tartrate (LOPRESSOR ) 50 MG tablet, Take 2 hours prior to procedure, Disp: 1 tablet, Rfl: 0   pravastatin  (PRAVACHOL ) 40 MG tablet, Take 1 tablet (40 mg total) by mouth every evening. (Patient not taking: Reported on 06/06/2024), Disp: 90 tablet, Rfl: 3  Labs and Diagnostic Imaging   CBC:  Recent Labs  Lab 06/06/24 1458 06/06/24 1503 06/07/24 0347  WBC 11.2*  --  9.5  NEUTROABS 8.9*  --   --   HGB 15.7* 16.0* 13.6  HCT 46.0 47.0* 39.8  MCV 89.0  --  90.2  PLT 321  --  268    Basic Metabolic Panel:  Lab Results  Component Value Date   NA 137 06/07/2024   K 3.3 (L) 06/07/2024   CO2 23 06/07/2024   GLUCOSE 96 06/07/2024   BUN 19 06/07/2024   CREATININE 0.90 06/07/2024   CALCIUM  9.2 06/07/2024   GFRNONAA >60 06/07/2024   GFRAA 71 06/21/2019   Lipid Panel:  Lab Results  Component Value Date   LDLCALC 132 (H) 06/07/2024   HgbA1c:  Lab Results  Component Value Date   HGBA1C 5.3 12/19/2016   Urine Drug Screen: No results found for: "LABOPIA", "COCAINSCRNUR", "LABBENZ", "AMPHETMU", "THCU", "LABBARB"  Alcohol  Level     Component Value Date/Time   ETH <15 06/06/2024 1458   INR  Lab Results  Component Value Date   INR 1.0 06/06/2024   APTT  Lab Results  Component Value Date   APTT 27 06/06/2024      Assessment  Gabriela Moyer is a 83 y.o. female with a PMHx of CAD, HTN, HLD, peripheral vascular disease, paroxysmal afib not on AC for at least 5 years due to 2/2 GI bleed, renal artery stenosis, hx of migraines with visual aura who presents with typical migraine headache along with nausea which is also typical of her migraine.  However, in addition, she reports having vertigo on Friday and vision changes.  Her neuroexam is notable for left hemianopsia, no meningismus, intact cognition and executive function.   - MRI of the brain demonstrates multiple abnormalities including a right PCA stroke along with leptomeningeal/pial-arachnoid enhancement in the posterior left frontal lobe and high left parietal lobe corresponding to area of abnormality on CT. In addition, demonstrated diffuse dural enhancement on the right and sulcal enhancement on the left.   -  The next step in her workup is to consider lumbar puncture.  However, patient took Eliquis last night and has to have a 5-day washout before she can get LP.  - Today, she states that she did not take her Eliquis, so LP may be able to be completed earlier, although given variations in the history given, will need to verify.   - Spot EEG: This study showed 2 focal seizures without clinical signs arising from right occipital region at 0825 and 0833 lasting for about 1 minute 10 seconds each. Additionally there was evidence of cortical dysfunction in right occipital region likely secondary to underlying structural abnormality. - Differential is broad and includes CNS lymphoma, vasculitis, leptomeningeal carcinomatosis secondary to metastatic solid tumor, CNS TB (unlikely but possible) and neurosarcoidosis. Low suspicion for bacterial meningitis (other than possible TB), given that she is not encephalopathic. A fungal meningitis is also possible but unlikely given the lack of meningismus with intact mentation.    Recommendations  - Stroke work up. - In  addition, will get MRI of the C, T, L-spine with and without contrast to evaluate for any leptomeningeal enhancement/carcinomatosis.  - ESR, CRP, ANA with IFA, dsDNA, Lyme PCR, RPR, HIV, autoimmune encephalopathy panel, serum ACE, QuantiFERON gold plus.   - LTM EEG has been ordered.  - Loading with Keppra 2000 mg IV x 1 now - Continue with scheduled Keppra 500 mg IV BID.  - Will need LP in 4 days (5 days after holding Eliquis) if she took a dose of Eliquis as documented in the chart. However, if her statement that she has not taken any doses of Eliquis can be verified, then can perform LP earlier. Can cover with IV heparin  until then for her atrial fibrillation.  - Would hold off on antiplatelet agents for now given need for LP.   Addendum:  LTM EEG reveals continued seizure activity. Keppra increased to 1500 mg IV BID, first dose now. ______________________________________________________________________   Hope Ly, Ritta Hammes, MD [facility]

## 2024-06-07 NOTE — Evaluation (Signed)
 Occupational Therapy Evaluation Patient Details Name: Gabriela Moyer MRN: [id number] DOB: [birth year] Today's Date: 06/07/2024   History of Present Illness   Pt is an 83 y/o F admitted on 06/06/24 after presenting with c/o HA x 4 days, as well as vertigo & vision changes.  MRI brain w + w /o C which demonstrated leptomeningeal/pial-arachnoid enhancement in the posterior left frontal lobe and high left parietal lobe corresponding to area of abnormality on CT. 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 PMH: chronic migraine, HTN, HLD, extensive PAD of subclavian, axillary, brachial & renal artery s/p R renal artery stent 2010, hypothyroidism, paroxysmal a-fib.     Clinical Impressions Pt currently with increased dizziness noted during positional changes and rated at a 5/10.  Also, with some questionable hypotension with BP initially in supine at 76/60 prior to session and then eventually up to 106/52 with HOB up approximately 30 degrees.  No symptoms reported and then in sitting she increased up to 143/72.  Significant left visual field cut noted from around midline to the left.  Coordination WFLs for BUE with gross testing with slight balance deficits noted with standing and small steps up and down the EOB.  Pt hooked up to video EEG so mobility limited during this OT session.  Prior to admission pt lived alone and was independent.  Feel she will benefit from acute care OT at this time in order to progress ADL independence and provide education on vision compensation and vestibular treatment techniques to help reduce dizziness.  Recommend HHOT for follow-up therapy at discharge.      If plan is discharge home, recommend the following:   A little help with walking and/or transfers;A little help with bathing/dressing/bathroom;Assist for transportation;Help with stairs or ramp for entrance     Functional Status Assessment   Patient has had a recent decline in their  functional status and demonstrates the ability to make significant improvements in function in a reasonable and predictable amount of time.     Equipment Recommendations   None recommended by OT      Precautions/Restrictions   Precautions Precautions: Fall Precaution/Restrictions Comments: left visual field cut Restrictions Weight Bearing Restrictions Per Provider Order: No     Mobility Bed Mobility Overal bed mobility: Modified Independent                  Transfers Overall transfer level: Needs assistance Equipment used: None Transfers: Sit to/from Stand, Bed to chair/wheelchair/BSC Sit to Stand: Min assist     Step pivot transfers: Min assist     General transfer comment: Initial posterior LOB in standing and with taking a few steps.  Limited mobility secondary to ongoing EEG.      Balance Overall balance assessment: Needs assistance Sitting-balance support: Feet supported Sitting balance-Leahy Scale: Fair     Standing balance support: During functional activity, No upper extremity supported Standing balance-Leahy Scale: Poor Standing balance comment: Posterior LOB noted when attempting to pull pants over hips without UE support for balance.                           ADL either performed or assessed with clinical judgement   ADL Overall ADL's : Needs assistance/impaired Eating/Feeding: Supervision/ safety;Sitting   Grooming: Wash/dry hands;Wash/dry face;Set up;Sitting   Upper Body Bathing: Set up;Sitting   Lower Body Bathing: Minimal assistance;Sit to/from stand   Upper Body Dressing : Supervision/safety;Sitting   Lower Body Dressing:  Minimal assistance;Sit to/from stand Lower Body Dressing Details (indicate cue type and reason): For donning her pants Toilet Transfer: Minimal assistance;Ambulation Toilet Transfer Details (indicate cue type and reason): short distance mobility a few steps around the side of the bed Toileting-  Clothing Manipulation and Hygiene: Minimal assistance;Sit to/from stand       Functional mobility during ADLs: Minimal assistance General ADL Comments: Pt limited with mobility secondary to ongoing video EEG.  Significant visual field deficit noted from around midline to the left side of visual space.  Pt with variable BP with noted 106/52 in supine with HOB elevated approximated 30 degrees and several BPs in the 70/60 range.  Once sitting BP elevated to 143/72.  Dizziness with movement rated around a 5/10 with transition to the EOB and then at a similar level with standing.  Discussed findings with pt's daughter-in-law who says they will work out initial 24 hour supervision post discharge.     Vision Baseline Vision/History: 1 Wears glasses Ability to See in Adequate Light: 0 Adequate Patient Visual Report: Blurring of vision;Eye fatigue/eye pain/headache Vision Assessment?: Yes Eye Alignment: Within Functional Limits Ocular Range of Motion: Within Functional Limits Alignment/Gaze Preference: Within Defined Limits Tracking/Visual Pursuits: Other (comment) (Loss of target when scanning left of midline) Saccades: Additional head turns occurred during testing Convergence: Within functional limits Visual Fields: Left visual field deficit     Perception Perception: Within Functional Limits       Praxis Praxis: WFL       Pertinent Vitals/Pain Pain Assessment Pain Assessment: Faces Faces Pain Scale: No hurt     Extremity/Trunk Assessment Upper Extremity Assessment Upper Extremity Assessment: Overall WFL for tasks assessed   Lower Extremity Assessment Lower Extremity Assessment: Defer to PT evaluation   Cervical / Trunk Assessment Cervical / Trunk Assessment: Normal   Communication Communication Communication: No apparent difficulties   Cognition Arousal: Alert Behavior During Therapy: WFL for tasks assessed/performed                                 Following  commands: Intact       Cueing  General Comments   Cueing Techniques: Verbal cues              Home Living Family/patient expects to be discharged to:: Private residence Living Arrangements: Alone Available Help at Discharge:  (reports she has family/friends that could provide as much as 24 hr assist if needed) Type of Home: House Home Access: Stairs to enter [employer] of Steps: 1 Entrance Stairs-Rails: None Home Layout: One level     Bathroom Shower/Tub: Chief Strategy Officer: Handicapped height     Home Equipment: Tub bench          Prior Functioning/Environment Prior Level of Function : Driving             Mobility Comments: Independent without AD, driving, denies falls in the past 6 months. ADLs Comments: Bathes & dresses herself, grocery shops. Has a 84 y/o Goldendoodle named "[place] ".    OT Problem List: Impaired balance (sitting and/or standing);Impaired vision/perception;Decreased knowledge of use of DME or AE   OT Treatment/Interventions: Self-care/ADL training;Balance training;Therapeutic activities;Neuromuscular education;Visual/perceptual remediation/compensation;Patient/family education      OT Goals(Current goals can be found in the care plan section)   Acute Rehab OT Goals Patient Stated Goal: Pt did not state but agreeable to participate in OT session. OT Goal Formulation: With patient  Time For Goal Achievement: 06/07/24 Potential to Achieve Goals: Good   OT Frequency:  Min 2X/week       AM-PAC OT "6 Clicks" Daily Activity     Outcome Measure Help from another person eating meals?: A Little Help from another person taking care of personal grooming?: A Little Help from another person toileting, which includes using toliet, bedpan, or urinal?: A Little Help from another person bathing (including washing, rinsing, drying)?: A Little Help from another person to put on and taking off regular upper body  clothing?: A Little Help from another person to put on and taking off regular lower body clothing?: A Little 6 Click Score: 18   End of Session Nurse Communication: Mobility status;Other (comment) (decreased BP)  Activity Tolerance: Patient tolerated treatment well Patient left: in bed;with call bell/phone within reach  OT Visit Diagnosis: Unsteadiness on feet (R26.81);Muscle weakness (generalized) (M62.81);Dizziness and giddiness (R42)                Time: 1350-1435 OT Time Calculation (min): 45 min Charges:  OT General Charges $OT Visit: 1 Visit OT Evaluation $OT Eval Moderate Complexity: 1 Mod OT Treatments $Self Care/Home Management : 23-37 mins  Ardena Becker, OTR/L Acute Rehabilitation Services  Office [phone] 06/07/2024

## 2024-06-07 NOTE — ED Notes (Signed)
EEG in room 

## 2024-06-07 NOTE — ED Notes (Signed)
 Patient is unable to be ambulated at this time due to overnight EEG

## 2024-06-07 NOTE — Progress Notes (Signed)
  Echocardiogram 2D Echocardiogram has been performed.  Fain Home RDCS 06/07/2024, 10:42 AM

## 2024-06-08 ENCOUNTER — Inpatient Hospital Stay (HOSPITAL_COMMUNITY)

## 2024-06-08 DIAGNOSIS — N179 Acute kidney failure, unspecified: Secondary | ICD-10-CM | POA: Diagnosis not present

## 2024-06-08 DIAGNOSIS — Z8679 Personal history of other diseases of the circulatory system: Secondary | ICD-10-CM | POA: Diagnosis not present

## 2024-06-08 DIAGNOSIS — G4089 Other seizures: Secondary | ICD-10-CM | POA: Diagnosis not present

## 2024-06-08 DIAGNOSIS — R9089 Other abnormal findings on diagnostic imaging of central nervous system: Secondary | ICD-10-CM | POA: Diagnosis not present

## 2024-06-08 DIAGNOSIS — I639 Cerebral infarction, unspecified: Secondary | ICD-10-CM | POA: Diagnosis not present

## 2024-06-08 DIAGNOSIS — R569 Unspecified convulsions: Secondary | ICD-10-CM | POA: Diagnosis not present

## 2024-06-08 DIAGNOSIS — I10 Essential (primary) hypertension: Secondary | ICD-10-CM | POA: Diagnosis not present

## 2024-06-08 DIAGNOSIS — G43909 Migraine, unspecified, not intractable, without status migrainosus: Secondary | ICD-10-CM | POA: Diagnosis not present

## 2024-06-08 DIAGNOSIS — I63531 Cerebral infarction due to unspecified occlusion or stenosis of right posterior cerebral artery: Secondary | ICD-10-CM | POA: Diagnosis not present

## 2024-06-08 LAB — ANCA PROFILE
Anti-MPO Antibodies: <0.2 U (ref 0.0–0.9)
Anti-PR3 Antibodies: <0.2 U (ref 0.0–0.9)
Atypical P-ANCA titer: <1:20 {titer}
C-ANCA: <1:20 {titer}
P-ANCA: <1:20 {titer}

## 2024-06-08 LAB — HEMOGLOBIN A1C
Hgb A1c MFr Bld: 5.5 % (ref 4.8–5.6)
Mean Plasma Glucose: 111 mg/dL

## 2024-06-08 LAB — ALDOLASE: Aldolase: 4.5 U/L (ref 3.3–10.3)

## 2024-06-08 LAB — C3 COMPLEMENT: C3 Complement: 164 mg/dL (ref 82–167)

## 2024-06-08 LAB — CYCLIC CITRUL PEPTIDE ANTIBODY, IGG/IGA: CCP Antibodies IgG/IgA: 7 U (ref 0–19)

## 2024-06-08 LAB — C4 COMPLEMENT: Complement C4, Body Fluid: 20 mg/dL (ref 12–38)

## 2024-06-08 LAB — RHEUMATOID FACTOR: Rheumatoid fact SerPl-aCnc: <10 [IU]/mL (ref <14.0)

## 2024-06-08 LAB — VALPROIC ACID LEVEL: Valproic Acid Lvl: 114 ug/mL — ABNORMAL HIGH (ref 50–100)

## 2024-06-08 LAB — ANTI-SCLERODERMA ANTIBODY: Scleroderma (Scl-70) (ENA) Antibody, IgG: <0.2 AI (ref 0.0–0.9)

## 2024-06-08 MED ORDER — DIVALPROEX SODIUM 250 MG PO DR TAB
500.0000 mg | DELAYED_RELEASE_TABLET | Freq: Two times a day (BID) | ORAL | Status: DC
  Administered 2024-06-08 – 2024-06-09 (×3): 500 mg via ORAL
  Filled 2024-06-08 (×3): qty 2

## 2024-06-08 MED ORDER — SODIUM CHLORIDE 0.9 % IV SOLN: INTRAVENOUS | Status: AC

## 2024-06-08 MED ORDER — LIDOCAINE HCL (PF) 1 % IJ SOLN
5.0000 mL | Freq: Once | INTRAMUSCULAR | Status: AC
  Administered 2024-06-08: 5 mL

## 2024-06-08 MED ORDER — LIDOCAINE 1 % OPTIME INJ - NO CHARGE
5.0000 mL | Freq: Once | INTRAMUSCULAR | Status: AC
  Administered 2024-06-08: 5 mL
  Filled 2024-06-08: qty 6

## 2024-06-08 NOTE — Progress Notes (Signed)
 Progress Note   Patient: Gabriela Moyer ZOX:[id number] DOB: 1941/01/17 DOA: 06/06/2024     2 DOS: the patient was seen and examined on 06/08/2024   Brief hospital course: MISHEL SANS is a 83 y.o. female with medical history significant of chronic migraine, essential hypertension, hyperlipidemia, extensive  PAD of subclavian, axillary, brachial and renal artery status post right renal artery stent 2010, hypothyroidism and paroxysmal afibrillation not on Xarelto  due to history of GI bleed recent emergency department complaining of headache for 4 days which is right-sided with associated vertigo, some vision change with the onset of the headache.   CT head: Amorphous high-density areas noted in the left frontal region along the cortex and sulci. It is difficult to determine if this is subarachnoid hemorrhage or within the cortex.   6 mm small hyperdense area adjacent to the right lateral ventricle concerning for small brain lesion.   Low-density area in the right occipital lobe could reflect infarct or edema related to underlying lesion.   MRI brain showed: Leptomeningeal/pial-arachnoid enhancement in the posterior left frontal lobe and high left parietal lobe corresponding to area of abnormality on CT. No definite underlying parenchymal enhancement or edema. 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 Findings most concerning for meningitis and possible focus of encephalitis in the right occipital lobe. Recommend lumbar puncture for further evaluation.   Differential could also include leptomeningeal carcinomatosis. Complex migraine and associated vasospasm could potentially lead to infarct although the degree of enhancement especially in the frontoparietal lobes is atypical.   Diffuse dural enhancement noted.   Nodule along the surface of the right lateral ventricle with signal intensity similar to cortical  gray matter without associated enhancement, suggestive of gray matter heterotopia.   Unremarkable MRI of the orbits.  No findings of optic neuritis. Admitted for further management and neurological evaluation.  During hospital stay, EEG showed status epileptius, started Keprra, depakote as per neurology recommendations. MRI spine - Suspected subtle leptomeningeal enhancement about the distal cord. Finding is nonspecific, and could be either infectious or inflammatory in nature. Possible neoplastic process with leptomeningeal carcinomatosis could also be considered. LP pending.  Assessment and Plan: Right sided vision deficit. Presented with migraine headache, nausea, vertigo, right visual field cut. Reviewed imaging studies concerning for leptomeningeal process rule out infectious etiology, ?malignancy. EEG last night showed multiple seizure lasting 1 min without clinical signs. Neurology recommended loading Keppra with 1500bid, Depakote 500 q12, LP for further evaluation. Orders placed. Continue neurochecks. A1c 5.5. LDL 132. Echo showed EF 65%, grade II diastolic dysfunction. Hold Plavix  as she will get LP for further evaluation of her MRI findings. Follow neurology recommendations. PT/ OT evaluation.  Hypokalemia: Oral potassium supplements. Continue to monitor and replete as needed.  Hypothyroidism: Home dose synthroid  resumed.  Acute migraine headache with associated nausea Continue Fioricet as needed.  QT prolongation: Avoid QT prolonging drugs. Continue Tigan as needed for nausea.  Hypotension. Orthostatic vitals positive. Hold antihypertensives. She got IV fluid boluses, continue gentle IV fluids. Hold toprol  as she has bradycardia. Monitor her vitals closely.    Out of bed to chair. Incentive spirometry. Nursing supportive care. Fall, aspiration precautions. Diet:  Diet Orders (From admission, onward)     Start     Ordered   06/06/24 2138  Diet Heart Fluid  consistency: Thin  Diet effective now       Question:  Fluid consistency:  Answer:  Thin   06/06/24 2137  DVT prophylaxis: Place and maintain sequential compression device Start: 06/07/24 1849 Place TED hose Start: 06/07/24 1849  Level of care: Telemetry Medical   Code Status: Full Code  Subjective: Patient is seen and examined today morning. She feels better today. States vision improved, eating fair. No nausea. Sister at bedside.  Physical Exam: Vitals:   06/07/24 1943 06/08/24 0007 06/08/24 0414 06/08/24 0819  BP: (!) 131/54 (!) 133/94 118/60 91/78  Pulse: 63 70 66 72  Resp: 17 17 17 20   Temp: (!) 97.5 F (36.4 C) 97.6 F (36.4 C) 98.6 F (37 C) 98 F (36.7 C)  TempSrc: Axillary Axillary Oral Oral  SpO2:  96%  97%  Weight:      Height:        General - Elderly ill Caucasian female, no acute distress. HEENT - PERRLA, EOMI, atraumatic head, non tender sinuses. Lung - Clear, basal rales, rhonchi, no wheezes. Heart - S1, S2 heard, no murmurs, rubs, trace pedal edema. Abdomen - Soft, non tender, bowel sounds good Neuro - Alert, awake and oriented, non focal exam. Skin - Warm and dry.  Data Reviewed:      Latest Ref Rng & Units 06/07/2024    3:47 AM 06/06/2024    3:03 PM 06/06/2024    2:58 PM  CBC  WBC 4.0 - 10.5 K/uL 9.5   11.2   Hemoglobin 12.0 - 15.0 g/dL 62.1  30.8  65.7   Hematocrit 36.0 - 46.0 % 39.8  47.0  46.0   Platelets 150 - 400 K/uL 268   321       Latest Ref Rng & Units 06/07/2024    3:47 AM 06/06/2024    3:03 PM 06/06/2024    2:58 PM  BMP  Glucose 70 - 99 mg/dL 96  846  962   BUN 8 - 23 mg/dL 19  26  22    Creatinine 0.44 - 1.00 mg/dL 9.52  8.41  3.24   Sodium 135 - 145 mmol/L 137  138  138   Potassium 3.5 - 5.1 mmol/L 3.3  3.3  3.3   Chloride 98 - 111 mmol/L 101  96  96   CO2 22 - 32 mmol/L 23   29   Calcium  8.9 - 10.3 mg/dL 9.2   40.1    Overnight EEG with video Result Date: 06/08/2024 Arleene Lack, MD     06/08/2024  9:01 AM  Patient Name: Gabriela Moyer MRN: [id number] Epilepsy Attending: Arleene Lack Referring Physician/Provider: Arleene Lack, MD Duration: 06/07/2024 1205 to 06/08/2024 0830 Patient history: 83 y.o. female with visual disturbance. EEG to evaluate for seizure.  Level of alertness: Awake, asleep  AEDs during EEG study: LEV, VPA  Technical aspects: This EEG study was done with scalp electrodes positioned according to the 10-20 International system of electrode placement. Electrical activity was reviewed with band pass filter of 1-70Hz , sensitivity of 7 uV/mm, display speed of 75mm/sec with a 60Hz  notched filter applied as appropriate. EEG data were recorded continuously and digitally stored.  Video monitoring was available and reviewed as appropriate.  Description: The posterior dominant rhythm consists of 8-9 Hz activity of moderate voltage (25-35 uV) seen predominantly in posterior head regions, asymmetric ( right<left) and reactive to eye opening and eye closing.  Sleep was characterized by vertex waves, sleep spindles (12-15Hz ), maximal frontocentral region.  EEG showed rhythmic sharply contoured 3 to 6 Hz theta-delta slowing in right>left occipital region   Hyperventilation and photic stimulation were not  performed.    Frequent seizures without clinical signs were noted during the study.  During the seizure EEG showed 12 to 13 Hz sharply contoured beta activity in right occipital region which then involve left occipital region and evolved into high amplitude sharply contoured 5 to 6 Hz theta slowing followed by 2 to 3 Hz delta slowing.  At the beginning of the study, average 7-10 seizures were noted per hour lasting duration of seizures was about 1 minute each.  Gradually as medications were adjusted, after around 2200, seizure frequency improved.  Subsequently average 1 seizure was noted per hour lasting about 1-2 minutes each. EEG was disconnected on 06/07/2024 between 1601 to 1911 for imaging.  ABNORMALITY -  Focal seizure, right occipital region - Background asymmetry, right<left -Intermittent slow, right>left occipital region  IMPRESSION: This study showed focal seizures without clinical signs arising from right occipital region. At the beginning of the study, average 7-10 seizures were noted per hour lasting duration of seizures was about 1 minute each.  Gradually his medications were adjusted, after around 2200, seizure frequency improved.  Subsequently average 1 seizure was noted per hour lasting about 1-2 minutes each.  Additionally there was evidence of cortical dysfunction in right > left occipital region likely secondary to underlying structural abnormality.  Dr. Lindzen And Dr Arora were intermittently notified.   Arleene Lack    MR Lumbar Spine W Wo Contrast Result Date: 06/08/2024 CLINICAL DATA:  Initial evaluation for leptomeningeal disease. EXAM: MRI LUMBAR SPINE WITHOUT AND WITH CONTRAST TECHNIQUE: Multiplanar and multiecho pulse sequences of the lumbar spine were obtained without and with intravenous contrast. CONTRAST:  7mL GADAVIST GADOBUTROL 1 MMOL/ML IV SOLN COMPARISON:  Brain MRI from 06/06/2024. FINDINGS: Segmentation: Examination somewhat degraded by motion artifact. Standard segmentation. Lowest well-formed disc space labeled the L5-S1 level. Alignment: Mild-to-moderate levoscoliosis. Trace degenerative retrolisthesis of L1 on L2. Vertebrae: Vertebral body height maintained without acute or chronic fracture. Bone marrow signal intensity mildly heterogeneous but overall within normal limits. No worrisome osseous lesions. Mild degenerative reactive endplate changes present about the L3-4 interspace. No evidence for osteomyelitis discitis or septic arthritis. Conus medullaris and cauda equina: Conus extends to the L1-2 level. Suspected subtle leptomeningeal enhancement about the distal cord (series 12, image 5 for example). Normal signal intensity within the distal cord and conus itself. No  convincing involvement of the nerve roots of the cauda equina is seen. Paraspinal and other soft tissues: Paraspinous soft tissues demonstrate no acute finding. Multiple scattered cystic lesions noted about the visualized kidneys, a few of which within the right kidney are mildly complex in appearance. Disc levels: L1-2: Mild diffuse disc bulge with reactive endplate spurring. Mild bilateral facet spurring. No spinal stenosis. Foramina remain patent. L2-3: Moderate degenerative intervertebral disc space narrowing with diffuse disc bulge. Reactive endplate change with marginal endplate osteophytic spurring. Mild bilateral facet hypertrophy. No significant spinal stenosis. Foramina remain adequately patent. L3-4: Degenerative intervertebral disc space narrowing with diffuse disc bulge in disc desiccation. Reactive endplate spurring. Moderate bilateral facet hypertrophy. No significant spinal stenosis. Mild bilateral L3 foraminal stenosis. L4-5: Degenerative intervertebral disc space narrowing with disc desiccation and mild diffuse disc bulge. Reactive endplate spurring. Mild to moderate bilateral facet hypertrophy. No significant spinal stenosis. Foramina remain patent. L5-S1: Negative interspace. Moderate bilateral facet arthrosis. No canal or foraminal stenosis. IMPRESSION: 1. Suspected subtle leptomeningeal enhancement about the distal cord. Finding is nonspecific, and could be either infectious or inflammatory in nature. Possible neoplastic process with leptomeningeal carcinomatosis could  also be considered. 2. Underlying multilevel degenerative spondylosis and facet arthrosis as above. No significant spinal stenosis. Mild bilateral L3 foraminal narrowing. 3. Multiple cystic lesions about the visualized right kidney, a few which are mildly complex in appearance and incompletely evaluated on this exam. Further evaluation with dedicated renal mass protocol CT and/or MRI suggested for further evaluation.  Electronically Signed   By: Virgia Griffins M.D.   On: 06/08/2024 03:41   MR THORACIC SPINE W WO CONTRAST Result Date: 06/08/2024 CLINICAL DATA:  Initial evaluation for leptomeningeal disease. EXAM: MRI THORACIC WITHOUT AND WITH CONTRAST TECHNIQUE: Multiplanar and multiecho pulse sequences of the thoracic spine were obtained without and with intravenous contrast. CONTRAST:  7mL GADAVIST GADOBUTROL 1 MMOL/ML IV SOLN COMPARISON:  Prior brain MRI from 06/06/2024. FINDINGS: Alignment: Mild dextroscoliosis. Alignment otherwise normal with preservation of the normal thoracic kyphosis. No significant listhesis. Vertebrae: Vertebral body height maintained without acute or chronic fracture. Bone marrow signal intensity diffusely heterogeneous but overall within normal limits. Few scattered benign hemangiomata noted. No worrisome osseous lesions. Degenerative reactive endplate change with marrow edema present about the T1-2 and T8-9 interspaces. No evidence for osteomyelitis discitis or septic arthritis. Cord: Suspected subtle intermittent left a meningeal enhancement seen about the thoracic cord. Changes are most pronounced about the right dorsal surface of the lower cord at the level of T11-12 (series 29, images 20, 21). Probable additional subtle patchy involvement elsewhere as well. Normal signal and morphology within the cord itself. Paraspinal and other soft tissues: Paraspinous soft tissues demonstrate no acute finding. Trace layering bilateral pleural effusions. Multiple scattered cystic lesions noted within the visualized kidneys, a few of which are somewhat complex in appearance with T2 hypointensity. These are incompletely assessed on this exam. Disc levels: Ordinary for age multilevel disc bulging with reactive endplate spurring noted within the thoracic spine. No significant focal disc herniation. Mild multilevel facet degeneration. No significant spinal stenosis. Foramina remain patent. IMPRESSION: 1.  Subtle intermittent leptomeningeal enhancement about the thoracic cord, most pronounced about the right dorsal surface of the lower cord at the level of T11-12. Findings are again nonspecific, and could be either infectious or inflammatory in nature. Possible neoplastic process not excluded. 2. Underlying ordinary for age thoracic spondylosis without significant spinal stenosis. 3. Multiple scattered cystic lesions within the visualized kidneys, a few of which are mildly complex in appearance. These are incompletely assessed on this exam. Further evaluation with dedicated renal mass protocol CT and/or MRI suggested for further evaluation. Electronically Signed   By: Virgia Griffins M.D.   On: 06/08/2024 03:30   MR CERVICAL SPINE W WO CONTRAST Result Date: 06/08/2024 CLINICAL DATA:  Follow-up examination for leptomeningeal disease. EXAM: MRI CERVICAL SPINE WITHOUT AND WITH CONTRAST TECHNIQUE: Multiplanar and multiecho pulse sequences of the cervical spine, to include the craniocervical junction and cervicothoracic junction, were obtained without and with intravenous contrast. CONTRAST:  7mL GADAVIST GADOBUTROL 1 MMOL/ML IV SOLN COMPARISON:  Comparison made with brain MRI from 06/06/2024. FINDINGS: Alignment: Straightening with mild reversal of the normal cervical lordosis. No significant listhesis. Vertebrae: Vertebral body height maintained without acute or chronic fracture. Bone marrow signal intensity diffusely heterogeneous but overall within normal limits. No worrisome osseous lesions. No evidence for osteomyelitis discitis or septic arthritis. Cord: Suspected intermittent subtle leptomeningeal enhancement about the cervical cord, most pronounced along the ventral surface of the upper cervical cord at C1 through C4 (series 7, image 8). Normal signal seen within the cord itself. No other abnormal enhancement. Posterior Fossa,  vertebral arteries, paraspinal tissues: Craniocervical junction within normal  limits. Paraspinous soft tissues within normal limits. Normal flow voids seen within the vertebral arteries bilaterally. Disc levels: C2-C3: Disc bulge with left-sided uncovertebral spurring. Left-sided facet hypertrophy. No spinal stenosis. Moderate left C3 foraminal narrowing. Right neural foramina remains patent. C3-C4: Disc bulge with bilateral uncovertebral spurring. Right worse than left facet hypertrophy with small joint effusion on the right. No significant spinal stenosis. Moderate to severe bilateral C4 foraminal narrowing. C4-C5: Degenerate intervertebral disc space narrowing. Central to right paracentral disc osteophyte complex indents the ventral thecal sac. Mild cord flattening without cord signal changes. Mild spinal stenosis. Uncovertebral spurring with resultant moderate right and mild left C5 foraminal stenosis. C5-C6: Ad C5 and C6 vertebral bodies are ankylosed, likely postoperative in nature. Endplate spurring indents the ventral thecal sac with residual mild spinal stenosis. Foramina appear patent. C6-C7: Degenerative intervertebral disc space narrowing with diffuse disc osteophyte complex. Flattening and partial effacement of the ventral thecal sac, worse on the right. Mild spinal stenosis. Foramina remain patent. C7-T1: Mild disc bulge with uncovertebral spurring. Moderate right facet hypertrophy. No spinal stenosis. Foramina remain patent. IMPRESSION: 1. Suspected subtle leptomeningeal enhancement about the cervical cord as above. Findings are nonspecific, and could be either infectious or inflammatory in nature. Possible metastatic disease could also be considered given the corresponding findings on prior brain MRI. 2. 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 3. Multifactorial degenerative changes with resultant multilevel foraminal narrowing as above. Notable findings include moderate left C3 foraminal stenosis, moderate to severe bilateral  C4 foraminal narrowing, with moderate right C5 foraminal stenosis. Electronically Signed   By: Virgia Griffins M.D.   On: 06/08/2024 03:18   MR ANGIO HEAD WO CONTRAST Result Date: 06/07/2024 CLINICAL DATA:  Initial evaluation for acute CVA. EXAM: MRA NECK WITHOUT AND WITH CONTRAST MRA HEAD WITHOUT AND WITH CONTRAST TECHNIQUE: Multiplanar and multiecho pulse sequences of the neck were obtained without and with intravenous contrast. Angiographic images of the neck were obtained using MRA technique without and with intravenous contast.; Angiographic images of the Circle of Willis were obtained using MRA technique without and with intravenous contrast. CONTRAST:  7mL GADAVIST GADOBUTROL 1 MMOL/ML IV SOLN COMPARISON:  None Available. FINDINGS: MRA NECK FINDINGS Aortic arch: Examination technically limited by timing of the contrast bolus and motion artifact. Visualized aortic arch within normal limits for caliber with standard branch pattern. No visible stenosis or other abnormality about the origin the great vessels. Right carotid system: Right common and internal carotid arteries are patent with antegrade flow. No evidence for dissection. Mild atheromatous irregularity about the right carotid bulb without hemodynamically significant greater than 50% stenosis. Left carotid system: Left common and internal carotid arteries are patent with antegrade flow. No evidence for dissection. Minor atheromatous irregularity about the left carotid bulb without hemodynamically significant greater than 50% stenosis. Vertebral arteries: Both vertebral arteries arise from subclavian arteries. Vertebral arteries are patent with antegrade flow. No evidence for dissection. No hemodynamically significant stenosis. Other: None. MRA HEAD FINDINGS Anterior circulation: Both internal carotid arteries are patent through the siphons without significant stenosis or other abnormality. A1 segments patent bilaterally. Normal anterior  communicating artery complex. Anterior cerebral arteries patent without significant stenosis. No M1 stenosis or occlusion. Distal MCA branches perfused and symmetric. Posterior circulation: Both V4 segments patent without significant stenosis. Both PICA patent at their origins. Basilar patent without significant stenosis. Superior cerebral arteries patent bilaterally. Both PCAs primarily supplied via the  basilar. PCAs are patent to their distal aspects without significant stenosis. Anatomic variants: None significant. Other: No intracranial aneurysm or other vascular malformation. IMPRESSION: MRA HEAD: Negative intracranial MRA for large vessel occlusion or other emergent finding. No hemodynamically significant or correctable stenosis. MRA NECK: 1. Negative MRA of the neck for large vessel occlusion or other emergent finding. 2. Mild for age atheromatous irregularity about the carotid bifurcations without hemodynamically significant stenosis. Electronically Signed   By: Virgia Griffins M.D.   On: 06/07/2024 20:49   MR ANGIO NECK W WO CONTRAST Result Date: 06/07/2024 CLINICAL DATA:  Initial evaluation for acute CVA. EXAM: MRA NECK WITHOUT AND WITH CONTRAST MRA HEAD WITHOUT AND WITH CONTRAST TECHNIQUE: Multiplanar and multiecho pulse sequences of the neck were obtained without and with intravenous contrast. Angiographic images of the neck were obtained using MRA technique without and with intravenous contast.; Angiographic images of the Circle of Willis were obtained using MRA technique without and with intravenous contrast. CONTRAST:  7mL GADAVIST GADOBUTROL 1 MMOL/ML IV SOLN COMPARISON:  None Available. FINDINGS: MRA NECK FINDINGS Aortic arch: Examination technically limited by timing of the contrast bolus and motion artifact. Visualized aortic arch within normal limits for caliber with standard branch pattern. No visible stenosis or other abnormality about the origin the great vessels. Right carotid system:  Right common and internal carotid arteries are patent with antegrade flow. No evidence for dissection. Mild atheromatous irregularity about the right carotid bulb without hemodynamically significant greater than 50% stenosis. Left carotid system: Left common and internal carotid arteries are patent with antegrade flow. No evidence for dissection. Minor atheromatous irregularity about the left carotid bulb without hemodynamically significant greater than 50% stenosis. Vertebral arteries: Both vertebral arteries arise from subclavian arteries. Vertebral arteries are patent with antegrade flow. No evidence for dissection. No hemodynamically significant stenosis. Other: None. MRA HEAD FINDINGS Anterior circulation: Both internal carotid arteries are patent through the siphons without significant stenosis or other abnormality. A1 segments patent bilaterally. Normal anterior communicating artery complex. Anterior cerebral arteries patent without significant stenosis. No M1 stenosis or occlusion. Distal MCA branches perfused and symmetric. Posterior circulation: Both V4 segments patent without significant stenosis. Both PICA patent at their origins. Basilar patent without significant stenosis. Superior cerebral arteries patent bilaterally. Both PCAs primarily supplied via the basilar. PCAs are patent to their distal aspects without significant stenosis. Anatomic variants: None significant. Other: No intracranial aneurysm or other vascular malformation. IMPRESSION: MRA HEAD: Negative intracranial MRA for large vessel occlusion or other emergent finding. No hemodynamically significant or correctable stenosis. MRA NECK: 1. Negative MRA of the neck for large vessel occlusion or other emergent finding. 2. Mild for age atheromatous irregularity about the carotid bifurcations without hemodynamically significant stenosis. Electronically Signed   By: Virgia Griffins M.D.   On: 06/07/2024 20:49   ECHOCARDIOGRAM  COMPLETE Result Date: 06/07/2024    ECHOCARDIOGRAM REPORT   Patient Name:   JADIE ALLINGTON Przybylski Date of Exam: 06/07/2024 Medical Rec #:  [id number]      Height:       62.0 in Accession #:    [id number]     Weight:       163.1 lb Date of Birth:  December 21, 1941      BSA:          1.753 m Patient Age:    83 years       BP:           104/68 mmHg Patient Gender: F  HR:           62 bpm. Exam Location:  Inpatient Procedure: 2D Echo, Cardiac Doppler, Color Doppler and Saline Contrast Bubble            Study (Both Spectral and Color Flow Doppler were utilized during            procedure). Indications:    Stroke I63.9  History:        Patient has prior history of Echocardiogram examinations, most                 recent 12/18/2016.  Sonographer:    Hersey Lorenzo RDCS Referring Phys: [phone] [name] IMPRESSIONS  1. Left ventricular ejection fraction, by estimation, is 55 to 60%. The left ventricle has normal function. The left ventricle has no regional wall motion abnormalities. Left ventricular diastolic parameters are consistent with Grade II diastolic dysfunction (pseudonormalization).  2. Right ventricular systolic function is normal. The right ventricular size is normal. Tricuspid regurgitation signal is inadequate for assessing PA pressure.  3. The mitral valve is normal in structure. No evidence of mitral valve regurgitation. No evidence of mitral stenosis.  4. The aortic valve is tricuspid. There is mild calcification of the aortic valve. Aortic valve regurgitation is mild. No aortic stenosis is present.  5. The inferior vena cava is normal in size with greater than 50% respiratory variability, suggesting right atrial pressure of 3 mmHg.  6. Agitated saline contrast bubble study was negative, with no evidence of any interatrial shunt. FINDINGS  Left Ventricle: Left ventricular ejection fraction, by estimation, is 55 to 60%. The left ventricle has normal function. The left ventricle has no regional wall motion  abnormalities. The left ventricular internal cavity size was normal in size. There is  no left ventricular hypertrophy. Left ventricular diastolic parameters are consistent with Grade II diastolic dysfunction (pseudonormalization). Right Ventricle: The right ventricular size is normal. No increase in right ventricular wall thickness. Right ventricular systolic function is normal. Tricuspid regurgitation signal is inadequate for assessing PA pressure. Left Atrium: Left atrial size was normal in size. Right Atrium: Right atrial size was normal in size. Pericardium: There is no evidence of pericardial effusion. Mitral Valve: The mitral valve is normal in structure. Mild mitral annular calcification. No evidence of mitral valve regurgitation. No evidence of mitral valve stenosis. Tricuspid Valve: The tricuspid valve is normal in structure. Tricuspid valve regurgitation is trivial. No evidence of tricuspid stenosis. Aortic Valve: The aortic valve is tricuspid. There is mild calcification of the aortic valve. Aortic valve regurgitation is mild. No aortic stenosis is present. Pulmonic Valve: The pulmonic valve was normal in structure. Pulmonic valve regurgitation is not visualized. No evidence of pulmonic stenosis. Aorta: The aortic root is normal in size and structure. Venous: The inferior vena cava is normal in size with greater than 50% respiratory variability, suggesting right atrial pressure of 3 mmHg. IAS/Shunts: No atrial level shunt detected by color flow Doppler. Agitated saline contrast was given intravenously to evaluate for intracardiac shunting. Agitated saline contrast bubble study was negative, with no evidence of any interatrial shunt.  LEFT VENTRICLE PLAX 2D LVIDd:         3.40 cm   Diastology LVIDs:         2.40 cm   LV e' medial:    3.70 cm/s LV PW:         1.20 cm   LV E/e' medial:  21.6 LV IVS:  1.20 cm   LV e' lateral:   4.46 cm/s LVOT diam:     1.70 cm   LV E/e' lateral: 17.9 LV SV:         75  LV SV Index:   43 LVOT Area:     2.27 cm  RIGHT VENTRICLE         IVC TAPSE (M-mode): 2.2 cm  IVC diam: 1.80 cm LEFT ATRIUM             Index        RIGHT ATRIUM          Index LA diam:        3.00 cm 1.71 cm/m   RA Area:     9.57 cm LA Vol (A2C):   40.4 ml 23.04 ml/m  RA Volume:   17.30 ml 9.87 ml/m LA Vol (A4C):   49.9 ml 28.46 ml/m LA Biplane Vol: 48.0 ml 27.38 ml/m  AORTIC VALVE LVOT Vmax:   124.00 cm/s LVOT Vmean:  89.900 cm/s LVOT VTI:    0.332 m  AORTA Ao Root diam: 2.40 cm Ao Asc diam:  3.20 cm MITRAL VALVE MV Area (PHT): 2.71 cm     SHUNTS MV Decel Time: 280 msec     Systemic VTI:  0.33 m MV E velocity: 79.90 cm/s   Systemic Diam: 1.70 cm MV A velocity: 134.00 cm/s MV E/A ratio:  0.60 Arta Lark Electronically signed by Arta Lark Signature Date/Time: 06/07/2024/2:00:24 PM    Final    EEG adult Result Date: 06/07/2024 Arleene Lack, MD     06/07/2024 10:17 AM Patient Name: Gabriela Moyer MRN: [id number] Epilepsy Attending: Arleene Lack Referring Physician/Provider: Roxan Copes, MD Date: 06/07/2024 Duration: 22.30 mins Patient history: 83 y.o. female with visual disturbance. EEG to evaluate for seizure. Level of alertness: Awake AEDs during EEG study: None Technical aspects: This EEG study was done with scalp electrodes positioned according to the 10-20 International system of electrode placement. Electrical activity was reviewed with band pass filter of 1-70Hz , sensitivity of 7 uV/mm, display speed of 25mm/sec with a 60Hz  notched filter applied as appropriate. EEG data were recorded continuously and digitally stored.  Video monitoring was available and reviewed as appropriate. Description: The posterior dominant rhythm consists of 8-9 Hz activity of moderate voltage (25-35 uV) seen predominantly in posterior head regions, asymmetric ( right<left) and reactive to eye opening and eye closing. Hyperventilation and photic stimulation were not performed.   2 seizures were noted during  the study at 0825 and 0833.  During the seizure, no clinical signs were noted.  EEG showed 12 to 13 Hz sharply contoured beta activity in right occipital region which then involve left occipital region and evolved into high amplitude sharply contoured 5 to 6 Hz theta slowing followed by 2 to 3 Hz delta slowing.  Duration of seizures was about 1 minute 10 seconds each ABNORMALITY - Focal seizure, right occipital region - Background asymmetry, right<left IMPRESSION: This study showed 2 focal seizures without clinical signs arising from right occipital region at 0825 and 0833 lasting for about 1 minute 10 seconds each. Additionally there was evidence of cortical dysfunction in right occipital region likely secondary to underlying structural abnormality. Dr. Lindzen was notified. Priyanka O Yadav   MR Brain W and Wo Contrast Result Date: 06/06/2024 CLINICAL DATA:  Headache, new onset. Concern for optic neuritis, suspected binocular vision loss. History of complex migraines. EXAM: MRI HEAD AND ORBITS WITHOUT AND WITH CONTRAST  TECHNIQUE: Multiplanar, multiecho pulse sequences of the brain and surrounding structures were obtained without and with intravenous contrast. Multiplanar, multiecho pulse sequences of the orbits and surrounding structures were obtained including fat saturation techniques, before and after intravenous contrast administration. CONTRAST:  7mL GADAVIST GADOBUTROL 1 MMOL/ML IV SOLN COMPARISON:  Earlier same day CT head. FINDINGS: MRI HEAD FINDINGS Brain: There is restricted diffusion involving the cortex and subcortical white matter in the right occipital lobe with mild associated edema and sulcal effacement. There is leptomeningeal/pial-arachnoid enhancement within this region. Additional areas of enhancement along the sulci in the posterior left frontal lobe and high left parietal lobe corresponding to areas of abnormality on same day CT concerning for additional leptomeningeal/pial-arachnoid  enhancement. There is abnormal sulcal FLAIR hyperintensity in this region without evidence of parenchymal edema or enhancement. Additional diffuse dural enhancement noted. T2/FLAIR hyperintensity in the periventricular and subcortical white matter. No evidence of intracranial hemorrhage. No midline shift. The basilar cisterns are patent. No extra-axial fluid collections. Ventricles: Normal size and configuration of the ventricles. Mildly T1 hypointense focus along the ependymal surface of the right lateral ventricle with signal similar to cortical gray matter which may reflect focus of heterotopic gray matter. No associated enhancement. Vascular: Skull base flow voids are visualized. Skull and upper cervical spine: No focal abnormality. Other: Mastoid air cells are clear. MRI ORBITS FINDINGS Orbits: No traumatic or inflammatory finding. Globes, optic nerves, orbital fat, extraocular muscles, vascular structures, and lacrimal glands are normal. There is no edema or enlargement of the optic nerves or abnormal enhancement to suggest optic neuritis. Visualized sinuses: Clear. Soft tissues: Negative. IMPRESSION: Leptomeningeal/pial-arachnoid enhancement in the posterior left frontal lobe and high left parietal lobe corresponding to area of abnormality on CT. No definite underlying parenchymal enhancement or edema. 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 Findings most concerning for meningitis and possible focus of encephalitis in the right occipital lobe. Recommend lumbar puncture for further evaluation. Differential could also include leptomeningeal carcinomatosis. Complex migraine and associated vasospasm could potentially lead to infarct although the degree of enhancement especially in the frontoparietal lobes is atypical. Diffuse dural enhancement noted. Nodule along the surface of the right lateral ventricle with signal intensity similar to  cortical gray matter without associated enhancement, suggestive of gray matter heterotopia. Unremarkable MRI of the orbits.  No findings of optic neuritis. Electronically Signed   By: Denny Flack M.D.   On: 06/06/2024 17:44   MR ORBITS W WO CONTRAST Result Date: 06/06/2024 CLINICAL DATA:  Headache, new onset. Concern for optic neuritis, suspected binocular vision loss. History of complex migraines. EXAM: MRI HEAD AND ORBITS WITHOUT AND WITH CONTRAST TECHNIQUE: Multiplanar, multiecho pulse sequences of the brain and surrounding structures were obtained without and with intravenous contrast. Multiplanar, multiecho pulse sequences of the orbits and surrounding structures were obtained including fat saturation techniques, before and after intravenous contrast administration. CONTRAST:  7mL GADAVIST GADOBUTROL 1 MMOL/ML IV SOLN COMPARISON:  Earlier same day CT head. FINDINGS: MRI HEAD FINDINGS Brain: There is restricted diffusion involving the cortex and subcortical white matter in the right occipital lobe with mild associated edema and sulcal effacement. There is leptomeningeal/pial-arachnoid enhancement within this region. Additional areas of enhancement along the sulci in the posterior left frontal lobe and high left parietal lobe corresponding to areas of abnormality on same day CT concerning for additional leptomeningeal/pial-arachnoid enhancement. There is abnormal sulcal FLAIR hyperintensity in this region without evidence of parenchymal edema or enhancement. Additional diffuse  dural enhancement noted. T2/FLAIR hyperintensity in the periventricular and subcortical white matter. No evidence of intracranial hemorrhage. No midline shift. The basilar cisterns are patent. No extra-axial fluid collections. Ventricles: Normal size and configuration of the ventricles. Mildly T1 hypointense focus along the ependymal surface of the right lateral ventricle with signal similar to cortical gray matter which may reflect  focus of heterotopic gray matter. No associated enhancement. Vascular: Skull base flow voids are visualized. Skull and upper cervical spine: No focal abnormality. Other: Mastoid air cells are clear. MRI ORBITS FINDINGS Orbits: No traumatic or inflammatory finding. Globes, optic nerves, orbital fat, extraocular muscles, vascular structures, and lacrimal glands are normal. There is no edema or enlargement of the optic nerves or abnormal enhancement to suggest optic neuritis. Visualized sinuses: Clear. Soft tissues: Negative. IMPRESSION: Leptomeningeal/pial-arachnoid enhancement in the posterior left frontal lobe and high left parietal lobe corresponding to area of abnormality on CT. No definite underlying parenchymal enhancement or edema. 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 Findings most concerning for meningitis and possible focus of encephalitis in the right occipital lobe. Recommend lumbar puncture for further evaluation. Differential could also include leptomeningeal carcinomatosis. Complex migraine and associated vasospasm could potentially lead to infarct although the degree of enhancement especially in the frontoparietal lobes is atypical. Diffuse dural enhancement noted. Nodule along the surface of the right lateral ventricle with signal intensity similar to cortical gray matter without associated enhancement, suggestive of gray matter heterotopia. Unremarkable MRI of the orbits.  No findings of optic neuritis. Electronically Signed   By: Denny Flack M.D.   On: 06/06/2024 17:44   CT Head Wo Contrast Result Date: 06/06/2024 CLINICAL DATA:  Headache EXAM: CT HEAD WITHOUT CONTRAST TECHNIQUE: Contiguous axial images were obtained from the base of the skull through the vertex without intravenous contrast. RADIATION DOSE REDUCTION: This exam was performed according to the departmental dose-optimization program which includes  automated exposure control, adjustment of the mA and/or kV according to patient size and/or use of iterative reconstruction technique. COMPARISON:  06/19/2019 FINDINGS: Brain: Areas of high density in left frontal region. It is difficult to determine if this is subarachnoid hemorrhage or within the cortex. Low-density area in the right occipital lobe. This could reflect infarct, or edema related to an underlying lesion. Small high-density 6 mm area adjacent to the right lateral ventricle concerning for brain lesion. No hydrocephalus. Vascular: No hyperdense vessel or unexpected calcification. Skull: No acute calvarial abnormality. Sinuses/Orbits: No acute findings Other: None IMPRESSION: Amorphous high-density areas noted in the left frontal region along the cortex and sulci. It is difficult to determine if this is subarachnoid hemorrhage or within the cortex. 6 mm small hyperdense area adjacent to the right lateral ventricle concerning for small brain lesion. Low-density area in the right occipital lobe could reflect infarct or edema related to underlying lesion. Recommend MRI without and with contrast for further evaluation. Electronically Signed   By: Janeece Mechanic M.D.   On: 06/06/2024 16:15    Family Communication: Discussed with patient, sister at bedside. They understand and agree. All questions answered.  Disposition: Status is: Inpatient Remains inpatient appropriate because: neurology work up, low BP, orthostatic. Pending LP  Planned Discharge Destination: Home with Home Health     Time spent: 41 minutes  Author: Aisha Hove, MD 06/08/2024 11:11 AM Secure chat 7am to 7pm For on call review [email or web address].

## 2024-06-08 NOTE — TOC CM/SW Note (Signed)
 Transition of Care [facility]) - Inpatient Brief Assessment   Patient Details  Name: Gabriela Moyer MRN: [id number] Date of Birth: 1941/08/13  [facility]) CM/SW Contact:    Jonathan Neighbor, RN Phone Number: 06/08/2024, 2:24 PM   Clinical Narrative:  Pt on LTEEG with +seizure activity.  LP unsuccessful.  TOC following for dc needs including HH services and DME.  Transition of Care Asessment: Insurance and Status: Insurance coverage has been reviewed Patient has primary care physician: Yes Home environment has been reviewed: home   Prior/Current Home Services: No current home services Social Drivers of Health Review: SDOH reviewed no interventions necessary Readmission risk has been reviewed: Yes Transition of care needs: transition of care needs identified, TOC will continue to follow

## 2024-06-08 NOTE — Evaluation (Signed)
 Speech Language Pathology Evaluation Patient Details Name: Gabriela Moyer MRN: [id number] DOB: 12/04/1941 Today's Date: 06/08/2024 Time: 4098-1191 SLP Time Calculation (min) (ACUTE ONLY): 15 min  Problem List:  Patient Active Problem List   Diagnosis Date Noted   Headache, right-sided blurry vision and dizziness 06/06/2024   History of migraine 06/06/2024   PVD (peripheral vascular disease) (HCC) 06/06/2024   History of stent insertion of renal artery 06/06/2024   History of paroxysmal atrial tachycardia 06/06/2024   History of GI bleed 06/06/2024   Ischemic cerebrovascular accident (CVA) (HCC) 06/06/2024   Migraine headache 06/06/2024   AKI (acute kidney injury) (HCC) 06/06/2024   Afib (HCC) 12/18/2016   Atrial fibrillation with RVR (HCC) 12/18/2016   Ankle swelling 06/10/2015   Hyperlipidemia 04/28/2014   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 08/2016 Carotid U/S: 1-39% bilat ICA stenosis, >50 R ECA stenosis.   Diastolic dysfunction    a. 11/2016 Echo: EF 65-70%, Gr1 DD.   History of stress test    a. 12/2016 Lexiscan  MV: EF 73%, no ischemia/infarct.   Hyperlipidemia    Hypertension    PAF (paroxysmal atrial fibrillation) (HCC)    a. 11/2016 AF RVR in ED-->converted on IV dilt-->CHA2DS2VASc = 4-->Xarelto .   Renal artery stenosis (HCC)    a. 06/2009 s/p R Renal Artery PTA/stenting; b. 08/2016 Renal Duplex: nl LRA, >60% RRA stenosis- f/u 1 yr.   Subclavian arterial stenosis (HCC)    a. 08/2014 Upper Ext Duplex: R distal [place] & Ax - 70-99% diam reduction, L distal [place] & Ax - 50-69%--stable.   Past Surgical History:  Past Surgical History:  Procedure Laterality Date   CARDIAC CATHETERIZATION  11/03/2007    atherosclerotic right renal artery (Dr. Jammie Mccune)   Carotid Doppler  12/2010   R & L ICA with small amount fibrous plaque; R distal subclav. 70-99% diameter reduction; L mid subclav 50-69% diameter reduction   Lower Extremity Arterial Doppler  03/2008   right ABI - mild arterial insuff at rest; R CIA with less than 50% diameter reduction, R & L SFA with less than 50% diameter reduction   NM MYOCAR PERF WALL MOTION  09/2009   bruce myoview  - normal perfusion, EF 915, low risk scan   RENAL ANGIOPLASTY  11/07/2009   5x20mm Cordis [employer] Aviator stent to ostium of right renal artery (Dr. Aleda Ammon)   Renal Artery Doppler  04/2012   celiac artery with >50% diameter reduction; R renal artery stent w/mildly elevated velocities (1-59% diameter reduction)   TRANSTHORACIC ECHOCARDIOGRAM  08/28/2009   EF=>55%; trace TR; mild AV regurg, mild pulm valve regurg   Upper Extremity Arterial Doppler  04/2012   right brachial pressure , left ; R distal subclavian & axillary arteries 70-99% diameter reduction; L distal subclavian/axillary & brachial arteries (50-69% diameter reduction)   HPI:  Patient is an 83 y.o. female with PMH:  chronic migraine, essential HTN, HLD, hypothyroidism, paroxysmal afibrillation, extensive PAD of subclavian, axillary, brachial & renal artery s/p R renal artery stent 2010. She presented to the hospital on 06/06/24 after four days of severe headache (has migraine headaches at baseline) as well as vertigo and vision changes. MRI brain was positive for Leptomeningeal/pial-arachnoid enhancement in the posterior left  frontal lobe and high left parietal lobe with findings most concerning for meningitis and possible focus of encephalitis in right occipital lobe. Lumbar puncture attempted at bedside and under fluoroscopy but no return of CSF despite multiple attempts.   Assessment / Plan / Recommendation Clinical Impression  Patient is not presenting with deficits in areas of cognition,  language or speech. Her only complaint is impairment of vision and associated nausea from vertigo. She is fully oriented to time, place and situation and demonstrates good awareness, memory and reasoning skills. Speech is 100% intelligible with no dysarthria observed. Oral motor musculature appears WNL. Patient's daughter reported that at baseline, patient has a couple migraines a month but when she has them they are severe and so initially, she and patient thought that she was just having another migraine episode. Patient told SLP that she wants the LP because she wants some answers as to what is going on. SLP is not recommending further skilled intervention as patient appears WNL for cognition, language and speech function.    SLP Assessment  SLP Recommendation/Assessment: Patient does not need any further Speech Language Pathology Services     Assistance Recommended at Discharge     Functional Status Assessment Patient has not had a recent decline in their functional status  Frequency and Duration           SLP Evaluation Cognition  Overall Cognitive Status: Within Functional Limits for tasks assessed Arousal/Alertness: Awake/alert Orientation Level: Oriented X4       Comprehension  Auditory Comprehension Overall Auditory Comprehension: Appears within functional limits for tasks assessed    Expression Expression Primary Mode of Expression: Verbal Verbal Expression Overall Verbal Expression: Appears within functional limits for tasks assessed   Oral / Motor  Oral Motor/Sensory Function Overall Oral Motor/Sensory Function: Within functional limits Motor Speech Overall Motor Speech: Appears within functional limits for tasks assessed Respiration: Within functional limits Resonance: Within functional limits Articulation: Within functional limitis Intelligibility: Intelligible Motor Planning: Within functional limits           Jacqualine Mater, MA, CCC-SLP Speech Therapy

## 2024-06-08 NOTE — Plan of Care (Signed)
   Problem: Coping: Goal: Will verbalize positive feelings about self Outcome: Progressing

## 2024-06-08 NOTE — Progress Notes (Signed)
 PT Cancellation Note  Patient Details Name: Gabriela Moyer MRN: [id number] DOB: [birth year]   Cancelled Treatment:    Reason Eval/Treat Not Completed: Patient at procedure or test/unavailable. Pt undergoing LP attempt. Acute PT to return as able to progress mobility.  Elveta Rape, PT, DPT Acute Rehabilitation Services Secure chat preferred Office #: [phone]    Jenna Moan 06/08/2024, 11:02 AM

## 2024-06-08 NOTE — Progress Notes (Signed)
 OT Cancellation Note  Patient Details Name: Gabriela Moyer MRN: [id number] DOB: [birth year]   Cancelled Treatment:    Reason Eval/Treat Not Completed: Patient at procedure or test/ unavailable (LP - OT to follow up as able.)  Starla Easterly 06/08/2024, 11:10 AM

## 2024-06-08 NOTE — Progress Notes (Signed)
 LTM maint complete - no skin breakdown under: F7, P8, A1, A2

## 2024-06-08 NOTE — Procedures (Addendum)
 Patient Name: Gabriela Moyer  MRN: [id number]  Epilepsy Attending: Arleene Lack  Referring Physician/Provider: Arleene Lack, MD  Duration: 06/07/2024 1205 to 06/08/2024 1515  Patient history: 83 y.o. female with visual disturbance. EEG to evaluate for seizure.    Level of alertness: Awake, asleep   AEDs during EEG study: LEV, VPA   Technical aspects: This EEG study was done with scalp electrodes positioned according to the 10-20 International system of electrode placement. Electrical activity was reviewed with band pass filter of 1-70Hz , sensitivity of 7 uV/mm, display speed of 32mm/sec with a 60Hz  notched filter applied as appropriate. EEG data were recorded continuously and digitally stored.  Video monitoring was available and reviewed as appropriate.   Description: The posterior dominant rhythm consists of 8-9 Hz activity of moderate voltage (25-35 uV) seen predominantly in posterior head regions, asymmetric ( right<left) and reactive to eye opening and eye closing.  Sleep was characterized by vertex waves, sleep spindles (12-15Hz ), maximal frontocentral region.  EEG showed rhythmic sharply contoured 3 to 6 Hz theta-delta slowing in right>left occipital region   Hyperventilation and photic stimulation were not performed.      Frequent seizures without clinical signs were noted during the study.  During the seizure EEG showed 12 to 13 Hz sharply contoured beta activity in right occipital region which then involve left occipital region and evolved into high amplitude sharply contoured 5 to 6 Hz theta slowing followed by 2 to 3 Hz delta slowing.  At the beginning of the study, average 7-10 seizures were noted per hour lasting duration of seizures was about 1 minute each.  Gradually as medications were adjusted, after around 2200, seizure frequency improved.  Subsequently average 1 seizure was noted per hour lasting about 1-2 minutes each. After around 0900, seizure frequency worsened and eeg  showed 3-4 seizures/hour lasting about 1 minute each.  EEG was disconnected on 06/07/2024 between 1601 to 1911 for imaging.   ABNORMALITY - Focal seizure, right occipital region - Background asymmetry, right<left - Intermittent slow, right>left occipital region   IMPRESSION: This study showed focal seizures without clinical signs arising from right occipital region. At the beginning of the study, average 7-10 seizures were noted per hour lasting duration of seizures was about 1 minute each.  Gradually his medications were adjusted, after around 2200, seizure frequency improved.  Subsequently average 1 seizure was noted per hour lasting about 1-2 minutes each.  After around 0900, seizure frequency worsened and eeg showed 3-4 seizures/hour lasting about 1 minute each.   Additionally there was evidence of cortical dysfunction in right > left occipital region likely secondary to underlying structural abnormality.   Dr. Lindzen And Dr Arora were intermittently notified.     Gabriela Moyer

## 2024-06-08 NOTE — Plan of Care (Signed)
   Problem: Education: Goal: Knowledge of disease or condition will improve Outcome: Progressing   Problem: Coping: Goal: Will verbalize positive feelings about self Outcome: Progressing

## 2024-06-08 NOTE — Progress Notes (Signed)
 NEUROLOGY CONSULT FOLLOW UP NOTE   Date of service: June 08, 2024 Patient Name: Gabriela Moyer MRN:  [id number] DOB:  [birth year]  Interval Hx/subjective   Patient seen in her room with her sister at the bedside.  She reports that she has not had any more migraines and no episodes concerning for seizure activity.  She would like to have a migraine preventative in the future, instructed patient to discuss this with outpatient neurology.  Patient states that she has not taken any Eliquis but that she took her Plavix  on Friday.  She was unable to read the label on her bottle but stated she was able to identify her Plavix  by feeling the shape of the pill.  Vitals   Vitals:   06/07/24 1859 06/07/24 1943 06/08/24 0007 06/08/24 0414  BP: (!) 144/42 (!) 131/54 (!) 133/94 118/60  Pulse: 62 63 70 66  Resp: 16 17 17 17   Temp:  (!) 97.5 F (36.4 C) 97.6 F (36.4 C) 98.6 F (37 C)  TempSrc:  Axillary Axillary Oral  SpO2: 97%  96%   Weight:      Height:         Body mass index is 29.84 kg/m.  Physical Exam   Constitutional: Appears well-developed and well-nourished.  Psych: Affect appropriate to situation.  Eyes: No scleral injection.  HENT: No OP obstrucion.  Head: Normocephalic.  Cardiovascular: Normal rate and regular rhythm.  Respiratory: Effort normal, non-labored breathing.  Skin: WDI.   Neurologic Examination   Mental Status: Patient is awake, alert, oriented to person, place, month, year, and situation.  3 out of 3 registration, 1 out of 3 recall, 2 out of 3 with hints, able to name 12 animals with 4 feet when asked Patient is able to give a clear and coherent history. No signs of aphasia or neglect Cranial Nerves: II: Left hemianopsia. (Patient endorses that this fluctuating) with some impaired vision to the far right side of the peripheral vision as well pupils are equal, round, and reactive to light.   III,IV, VI: Extraocular movements intact but some difficulty moving  eyes to the left, required additional prompting for this V: Facial sensation is symmetric to light touch VII: Facial movement is symmetric resting and smiling VIII: Hearing is intact to voice X: Palate elevates symmetrically XI: Shoulder shrug is symmetric. XII: Tongue protrudes midline without atrophy or fasciculations.  Motor: Tone is normal. Bulk is normal. 5/5 strength was present in all four extremities.  Sensory: Sensation is symmetric to light touch in the arms and legs.  Cerebellar: FNF with slight ataxia on the left, patient states that this is due to her vision and not coordination   Medications  Current Facility-Administered Medications:    acetaminophen  (TYLENOL ) tablet 650 mg, 650 mg, Oral, Q4H PRN **OR** acetaminophen  (TYLENOL ) 160 MG/5ML solution 650 mg, 650 mg, Per Tube, Q4H PRN **OR** acetaminophen  (TYLENOL ) suppository 650 mg, 650 mg, Rectal, Q4H PRN, Sundil, Subrina, MD   aspirin EC tablet 81 mg, 81 mg, Oral, Daily, Consuelo Denmark, MD, 81 mg at 06/07/24 1012   atorvastatin  (LIPITOR) tablet 40 mg, 40 mg, Oral, Daily, Consuelo Denmark, MD, 40 mg at 06/07/24 1012   butalbital-acetaminophen -caffeine (FIORICET) 50-325-40 MG per tablet 1 tablet, 1 tablet, Oral, Q6H PRN, Sundil, Subrina, MD, 1 tablet at 06/07/24 1940   diphenhydrAMINE (BENADRYL) injection 50 mg, 50 mg, Intravenous, Q6H PRN, Sundil, Subrina, MD   levETIRAcetam (KEPPRA) undiluted injection 1,500 mg, 1,500 mg, Intravenous, Q12H, Kayana Thoen, MD, 1,500  mg at 06/07/24 1934   levothyroxine  (SYNTHROID ) tablet 25 mcg, 25 mcg, Oral, Daily, Aisha Hove, MD, 25 mcg at 06/07/24 1927   senna-docusate (Senokot-S) tablet 1 tablet, 1 tablet, Oral, QHS PRN, Sundil, Subrina, MD   sodium chloride  0.9 % bolus 500 mL, 500 mL, Intravenous, Once, Aisha Hove, MD, Held at 06/07/24 1418   sodium chloride  flush (NS) 0.9 % injection 3-10 mL, 3-10 mL, Intravenous, Q12H, Sundil, Subrina, MD, 10 mL at 06/07/24 2150   sodium  chloride flush (NS) 0.9 % injection 3-10 mL, 3-10 mL, Intravenous, PRN, Sundil, Subrina, MD   trimethobenzamide Ellouise Haagensen) injection 200 mg, 200 mg, Intramuscular, Q6H PRN, Sundil, Subrina, MD  Labs and Diagnostic Imaging   CBC:  Recent Labs  Lab 06/06/24 1458 06/06/24 1503 06/07/24 0347  WBC 11.2*  --  9.5  NEUTROABS 8.9*  --   --   HGB 15.7* 16.0* 13.6  HCT 46.0 47.0* 39.8  MCV 89.0  --  90.2  PLT 321  --  268    Basic Metabolic Panel:  Lab Results  Component Value Date   NA 137 06/07/2024   K 3.3 (L) 06/07/2024   CO2 23 06/07/2024   GLUCOSE 96 06/07/2024   BUN 19 06/07/2024   CREATININE 0.90 06/07/2024   CALCIUM  9.2 06/07/2024   GFRNONAA >60 06/07/2024   GFRAA 71 06/21/2019   Lipid Panel:  Lab Results  Component Value Date   LDLCALC 132 (H) 06/07/2024   HgbA1c:  Lab Results  Component Value Date   HGBA1C 5.5 06/07/2024   Urine Drug Screen: No results found for: "LABOPIA", "COCAINSCRNUR", "LABBENZ", "AMPHETMU", "THCU", "LABBARB"  Alcohol  Level     Component Value Date/Time   ETH <15 06/06/2024 1458   INR  Lab Results  Component Value Date   INR 1.0 06/06/2024   APTT  Lab Results  Component Value Date   APTT 27 06/06/2024   ESR 9 Cyclic central peptide antibody pending C4 complement pending C3 complement pending Antiscleroderma antibody less than 0.2 CRP 0.7 ANCA profile pending ANA, IFA pending Aldolase pending Rheumatoid factor pending Paraneoplastic antibody pending QuantiFERON-TB pending CK 52 RPR nonreactive TSH 17.160 Free T4 1.54 Lyme disease DNA pending Valproic acid level 114  LTM EEG 6/10:  - Focal seizure, right occipital region - Background asymmetry, right<left -Intermittent slow, right>left occipital region  This study showed focal seizures without clinical signs arising from right occipital region. At the beginning of the study, average 7-10 seizures were noted per hour lasting duration of seizures was about 1 minute each.   Gradually his medications were adjusted, after around 2200, seizure frequency improved.  Subsequently average 1 seizure was noted per hour lasting about 1-2 minutes each.  Additionally there was evidence of cortical dysfunction in right > left occipital region likely secondary to underlying structural abnormality.  Assessment  SHELLEY COCKE is a 83 y.o. female with a PMHx of CAD, HTN, HLD, peripheral vascular disease, paroxysmal afib not on AC for at least 5 years due to 2/2 GI bleed, renal artery stenosis, hx of migraines with visual aura who presents with typical migraine headache along with nausea which is also typical of her migraine.  However, in addition, she reports having vertigo on Friday and vision changes.  Her neuroexam is notable for left hemianopsia, no meningismus, intact cognition and executive function. 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  - MRI of the brain demonstrates multiple abnormalities including  a right PCA stroke along with leptomeningeal/pial-arachnoid enhancement in the posterior left frontal lobe and high left parietal lobe corresponding to area of abnormality on CT. In addition, demonstrated diffuse dural enhancement on the right and sulcal enhancement on the left.   - MRI of the cervical, thoracic and lumbar spine demonstrates subtle leptomeningeal enhancement about the cervical cord and thoracic spinal cord most prominently at T11-T12 with leptomeningeal enhancement of the distal cord as well - The next step in her workup is to obtain a lumbar puncture.   - Today, she states that she did not take her Eliquis, so LP can be completed earlier, although given variations in the history given, will need to verify.  Patient took Plavix  on Friday, but given that it has been several days since last administration of this medication, it will be safe to perform LP now. - Spot EEG: This study showed 2 focal seizures  without clinical signs arising from right occipital region at 0825 and 0833 lasting for about 1 minute 10 seconds each. Additionally there was evidence of cortical dysfunction in right occipital region likely secondary to underlying structural abnormality. - LTM read 6/10 demonstrated focal seizures in right oiccipital region.  Will start Depakote 500 mg BID (was loaded last night but not started on scheduled VPA at that time.  - Differential is broad and includes CNS lymphoma, vasculitis, leptomeningeal carcinomatosis secondary to metastatic solid tumor, CNS TB (unlikely but possible) and neurosarcoidosis. Low suspicion for bacterial meningitis (other than possible TB), given that she is not encephalopathic. A fungal meningitis is also possible but unlikely given the lack of meningismus with intact mentation.    Recommendations  - Stroke work up. - ESR, CRP, ANA with IFA, dsDNA, Lyme PCR, RPR, HIV, autoimmune encephalopathy panel, serum ACE, QuantiFERON gold plus.   - Start Depakote 500 mg BID, check valproic acid level and ammonia Friday morning - Continue with scheduled Keppra 500 mg IV BID.  - Will need LP today. Can cover with IV heparin  afterwards - Would hold off on antiplatelet agents for now given need for LP.    Cortney E Bucky Cardinal , MSN, AGACNP-BC [facility] See Amion for schedule and pager information 06/08/2024 8:09 AM   Electronically signed: Dr. Cleland Simkins

## 2024-06-08 NOTE — Procedures (Signed)
 Procedure Note: Lumbar puncture  Indications: obtain CSF for diagnostic testing  Operator: Sadira Standard de la Carolyne Citizen, NP  Others present:   Indications, risks, and benefits explained to patient / surrogate decision maker and informed consent obtained. Time-out was performed, with all individuals present agreeing on the procedure to be performed, the site of procedure, and the patient identity.  Patient positioned, prepped and draped in usual sterile fashion. L3-4 space located using bilateral iliac crests as landmarks. 1% Lidocaine  without epinephrine  was used to anesthetize the area. Attempted to introduce a 20G spinal needle into the subarachnoid space.  Unable to access subarachnoid space and procedure was stopped due to patient discomfort. Will re-attempt procedure under fluoroscopy.  No immediate post-procedure complications noted.  Gabriela Moyer , MSN, AGACNP-BC [facility] See Amion for schedule and pager information 06/08/2024 10:44 AM

## 2024-06-08 NOTE — Plan of Care (Signed)
  Problem: Education: Goal: Knowledge of disease or condition will improve 06/08/2024 2352 by Jake Mayers, RN Outcome: Progressing 06/08/2024 2352 by Jake Mayers, RN Outcome: Progressing   Problem: Education: Goal: Knowledge of patient specific risk factors will improve (DELETE if not current risk factor) Outcome: Progressing   Problem: Ischemic Stroke/TIA Tissue Perfusion: Goal: Complications of ischemic stroke/TIA will be minimized Outcome: Progressing   Problem: Coping: Goal: Will identify appropriate support needs Outcome: Progressing   Problem: Health Behavior/Discharge Planning: Goal: Ability to manage health-related needs will improve Outcome: Progressing   Problem: Self-Care: Goal: Ability to participate in self-care as condition permits will improve Outcome: Progressing   Problem: Education: Goal: Knowledge of General Education information will improve Description: Including pain rating scale, medication(s)/side effects and non-pharmacologic comfort measures Outcome: Progressing   Problem: Nutrition: Goal: Adequate nutrition will be maintained Outcome: Progressing   Problem: Coping: Goal: Level of anxiety will decrease Outcome: Progressing   Problem: Elimination: Goal: Will not experience complications related to bowel motility Outcome: Progressing Goal: Will not experience complications related to urinary retention Outcome: Progressing   Problem: Pain Managment: Goal: General experience of comfort will improve and/or be controlled Outcome: Progressing   Problem: Safety: Goal: Ability to remain free from injury will improve Outcome: Progressing

## 2024-06-08 NOTE — Procedures (Addendum)
 PROCEDURE SUMMARY:  Unsuccessful fluoroscopic guided lumbar puncture. Access was attempted at L2-L3, L3-L4, and L5-S1, and the spinal needle was adequately placed into the thecal sac at all 3 levels. No fluid returned despite best attempts.   No immediate complications.  Pt tolerated with minor discomfort.   EBL = none  Please see full dictation in imaging section of Epic for procedure details.

## 2024-06-09 ENCOUNTER — Inpatient Hospital Stay (HOSPITAL_COMMUNITY)

## 2024-06-09 ENCOUNTER — Other Ambulatory Visit: Payer: Self-pay

## 2024-06-09 DIAGNOSIS — R569 Unspecified convulsions: Secondary | ICD-10-CM | POA: Diagnosis not present

## 2024-06-09 DIAGNOSIS — R9089 Other abnormal findings on diagnostic imaging of central nervous system: Secondary | ICD-10-CM | POA: Diagnosis not present

## 2024-06-09 DIAGNOSIS — I10 Essential (primary) hypertension: Secondary | ICD-10-CM | POA: Diagnosis not present

## 2024-06-09 DIAGNOSIS — E785 Hyperlipidemia, unspecified: Secondary | ICD-10-CM

## 2024-06-09 DIAGNOSIS — I639 Cerebral infarction, unspecified: Secondary | ICD-10-CM | POA: Diagnosis not present

## 2024-06-09 DIAGNOSIS — I2510 Atherosclerotic heart disease of native coronary artery without angina pectoris: Secondary | ICD-10-CM

## 2024-06-09 DIAGNOSIS — G40909 Epilepsy, unspecified, not intractable, without status epilepticus: Secondary | ICD-10-CM | POA: Diagnosis not present

## 2024-06-09 DIAGNOSIS — Z8679 Personal history of other diseases of the circulatory system: Secondary | ICD-10-CM | POA: Diagnosis not present

## 2024-06-09 DIAGNOSIS — N179 Acute kidney failure, unspecified: Secondary | ICD-10-CM | POA: Diagnosis not present

## 2024-06-09 DIAGNOSIS — I480 Paroxysmal atrial fibrillation: Secondary | ICD-10-CM | POA: Diagnosis not present

## 2024-06-09 LAB — BASIC METABOLIC PANEL WITH GFR
Anion gap: 9 (ref 5–15)
BUN: 17 mg/dL (ref 8–23)
CO2: 25 mmol/L (ref 22–32)
Calcium: 8.3 mg/dL — ABNORMAL LOW (ref 8.9–10.3)
Chloride: 106 mmol/L (ref 98–111)
Creatinine, Ser: 0.97 mg/dL (ref 0.44–1.00)
GFR, Estimated: 58 mL/min — ABNORMAL LOW (ref >60)
Glucose, Bld: 103 mg/dL — ABNORMAL HIGH (ref 70–99)
Potassium: 3 mmol/L — ABNORMAL LOW (ref 3.5–5.1)
Sodium: 140 mmol/L (ref 135–145)

## 2024-06-09 MED ORDER — PREDNISONE 5 MG PO TABS
50.0000 mg | ORAL_TABLET | Freq: Four times a day (QID) | ORAL | Status: AC
  Administered 2024-06-09 (×3): 50 mg via ORAL
  Filled 2024-06-09 (×3): qty 2

## 2024-06-09 MED ORDER — DIPHENHYDRAMINE HCL 50 MG/ML IJ SOLN: 50.0000 mg | Freq: Once | INTRAMUSCULAR | Status: AC

## 2024-06-09 MED ORDER — METOPROLOL TARTRATE 5 MG/5ML IV SOLN
5.0000 mg | Freq: Once | INTRAVENOUS | Status: AC
  Administered 2024-06-09: 5 mg via INTRAVENOUS
  Filled 2024-06-09: qty 5

## 2024-06-09 MED ORDER — IOHEXOL 350 MG/ML SOLN
75.0000 mL | Freq: Once | INTRAVENOUS | Status: AC | PRN
  Administered 2024-06-09: 75 mL via INTRAVENOUS

## 2024-06-09 MED ORDER — METOPROLOL SUCCINATE ER 25 MG PO TB24
25.0000 mg | ORAL_TABLET | Freq: Every day | ORAL | Status: DC
  Administered 2024-06-09 – 2024-06-25 (×13): 25 mg via ORAL
  Filled 2024-06-09 (×14): qty 1

## 2024-06-09 MED ORDER — DIVALPROEX SODIUM 500 MG PO DR TAB
750.0000 mg | DELAYED_RELEASE_TABLET | Freq: Two times a day (BID) | ORAL | Status: DC
  Administered 2024-06-09 – 2024-06-16 (×15): 750 mg via ORAL
  Filled 2024-06-09 (×2): qty 3
  Filled 2024-06-09: qty 1
  Filled 2024-06-09 (×14): qty 3

## 2024-06-09 MED ORDER — METOPROLOL TARTRATE 25 MG PO TABS
25.0000 mg | ORAL_TABLET | Freq: Once | ORAL | Status: AC
  Administered 2024-06-09: 25 mg via ORAL
  Filled 2024-06-09: qty 1

## 2024-06-09 MED ORDER — DIVALPROEX SODIUM 250 MG PO DR TAB
500.0000 mg | DELAYED_RELEASE_TABLET | Freq: Once | ORAL | Status: AC
  Administered 2024-06-09: 500 mg via ORAL
  Filled 2024-06-09: qty 2

## 2024-06-09 MED ORDER — LEVETIRACETAM (KEPPRA) 500 MG/5 ML ADULT IV PUSH
1500.0000 mg | Freq: Two times a day (BID) | INTRAVENOUS | Status: DC
  Administered 2024-06-10 – 2024-06-27 (×10): 1500 mg via INTRAVENOUS
  Filled 2024-06-09 (×16): qty 15

## 2024-06-09 MED ORDER — POTASSIUM CHLORIDE CRYS ER 20 MEQ PO TBCR
40.0000 meq | EXTENDED_RELEASE_TABLET | Freq: Two times a day (BID) | ORAL | Status: DC
  Administered 2024-06-09 – 2024-06-11 (×4): 40 meq via ORAL
  Filled 2024-06-09 (×4): qty 2

## 2024-06-09 MED ORDER — LEVETIRACETAM 750 MG PO TABS
1500.0000 mg | ORAL_TABLET | Freq: Two times a day (BID) | ORAL | Status: DC
  Administered 2024-06-09 – 2024-06-25 (×25): 1500 mg via ORAL
  Filled 2024-06-09 (×27): qty 2

## 2024-06-09 MED ORDER — DIPHENHYDRAMINE HCL 25 MG PO CAPS
50.0000 mg | ORAL_CAPSULE | Freq: Once | ORAL | Status: AC
  Administered 2024-06-09: 50 mg via ORAL
  Filled 2024-06-09: qty 2

## 2024-06-09 NOTE — Progress Notes (Signed)
 Physical Therapy Treatment Patient Details Name: Gabriela Moyer MRN: [id number] DOB: 1941/03/09 Today's Date: 06/09/2024   History of Present Illness Pt is an 83 y/o F admitted on 06/06/24 after presenting with c/o HA x 4 days, as well as vertigo & vision changes.  MRI brain w + w /o C which demonstrated leptomeningeal/pial-arachnoid enhancement in the posterior left frontal lobe and high left parietal lobe corresponding to area of abnormality on CT. 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 PMH: chronic migraine, HTN, HLD, extensive PAD of subclavian, axillary, brachial & renal artery s/p R renal artery stent 2010, hypothyroidism, paroxysmal a-fib.    PT Comments  Pt is progressing towards goals. Currently pt is Mod I for bed mobility, CGA to Min A for sit to stand and transfers and CGA to Min A for gait with RW. Previously pt was ind with all activities. Pt is demonstrating significant L neglect that is impairing functional mobility. Pt is motivated to progress and return to PLOF. Due to pt current functional status, home set up and available assistance at home recommending skilled physical therapy services > 3 hours/day in order to address strength, balance and functional mobility to decrease risk for falls, injury, immobility, skin break down and re-hospitalization.       If plan is discharge home, recommend the following: A little help with walking and/or transfers;A little help with bathing/dressing/bathroom;Assistance with cooking/housework;Assist for transportation;Help with stairs or ramp for entrance     Equipment Recommendations  Rolling walker (2 wheels);BSC/3in1    Recommendations for Other Services Rehab consult     Precautions / Restrictions Precautions Precautions: Fall Precaution/Restrictions Comments: left visual field cut, orthostatic Restrictions Weight Bearing Restrictions Per Provider Order: No     Mobility  Bed Mobility Overal bed  mobility: Modified Independent Bed Mobility: Supine to Sit     Supine to sit: Modified independent (Device/Increase time), HOB elevated          Transfers Overall transfer level: Needs assistance Equipment used: Rolling walker (2 wheels) Transfers: Sit to/from Stand, Bed to chair/wheelchair/BSC Sit to Stand: Contact guard assist   Step pivot transfers: Min assist       General transfer comment: CGA, requires assist during ambulation/transfers to avoid obstacles in L environment    Ambulation/Gait Ambulation/Gait assistance: Min assist, Contact guard assist Gait Distance (Feet): 150 Feet Assistive device: Rolling walker (2 wheels) Gait Pattern/deviations: Decreased step length - right, Fest[place], Decreased stride length, Drifts right/left Gait velocity: decreased Gait velocity interpretation: <1.8 ft/sec, indicate of risk for recurrent falls   General Gait Details: Pt with downward gaze and drifting L with heavy cues to avoid obstacles on the L and navigate AD away from obstacles to decrease risk for falls. Pt is demonstrating significant L neglect.    Modified Rankin (Stroke Patients Only) Modified Rankin (Stroke Patients Only) Pre-Morbid Rankin Score: No symptoms Modified Rankin: Moderately severe disability     Balance Overall balance assessment: Needs assistance Sitting-balance support: Feet supported Sitting balance-Leahy Scale: Fair     Standing balance support: Single extremity supported, During functional activity, Bilateral upper extremity supported Standing balance-Leahy Scale: Fair Standing balance comment: Posterior LOB noted intermittently when running into objects on the L        Communication Communication Communication: No apparent difficulties  Cognition Arousal: Alert Behavior During Therapy: WFL for tasks assessed/performed   PT - Cognitive impairments: No apparent impairments       Following commands: Intact  Cueing Cueing  Techniques: Verbal cues     General Comments General comments (skin integrity, edema, etc.): BP supine 132/54, sitting: 121/62, standing 109/65, after 3 min 93/52      Pertinent Vitals/Pain Pain Assessment Pain Assessment: No/denies pain     PT Goals (current goals can now be found in the care plan section) Acute Rehab PT Goals Patient Stated Goal: get better PT Goal Formulation: With patient Time For Goal Achievement: 06/21/24 Potential to Achieve Goals: Good Progress towards PT goals: Progressing toward goals    Frequency    Min 3X/week      PT Plan  Continue with current POC        AM-PAC PT 6 Clicks Mobility   Outcome Measure  Help needed turning from your back to your side while in a flat bed without using bedrails?: None Help needed moving from lying on your back to sitting on the side of a flat bed without using bedrails?: None Help needed moving to and from a bed to a chair (including a wheelchair)?: A Little Help needed standing up from a chair using your arms (e.g., wheelchair or bedside chair)?: A Little Help needed to walk in hospital room?: A Little Help needed climbing 3-5 steps with a railing? : A Lot 6 Click Score: 19    End of Session Equipment Utilized During Treatment: Gait belt Activity Tolerance: Patient tolerated treatment well Patient left: in chair;with call bell/phone within reach;with chair alarm set;Other (comment);with family/visitor present (OT in room) Nurse Communication: Mobility status PT Visit Diagnosis: Unsteadiness on feet (R26.81);Difficulty in walking, not elsewhere classified (R26.2)     Time: 1610-9604 PT Time Calculation (min) (ACUTE ONLY): 23 min  Charges:    $Therapeutic Activity: 23-37 mins PT General Charges $$ ACUTE PT VISIT: 1 Visit                    Sloan Duncans, DPT, CLT  Acute Rehabilitation Services Office: [phone] (Secure chat preferred)    Jenice Mitts 06/09/2024, 2:30 PM

## 2024-06-09 NOTE — Progress Notes (Signed)
 EEG maint complete. No SBD fp1 fp1 f7

## 2024-06-09 NOTE — Progress Notes (Signed)
 NEUROLOGY CONSULT FOLLOW UP NOTE   Date of service: June 09, 2024 Patient Name: Gabriela Moyer MRN:  [id number] DOB:  [birth year]  Interval Hx/subjective   Unable to obtain LP over the last couple of days, with an unsuccessful bedside attempt followed by an unsuccessful attempt under fluoro at 3 separate levels. [name] states that she is feeling stronger today. She did not have any dizziness or vertigo overnight but she did notice her heart racing. She did require PRN metoprolol . Additionally, she states that she has always had issues with her thyroid . She is compliant with her medication and does not take her synthroid  with food. EEG shows more seizures, will likely increase Depakote today.   Vitals   Vitals:   06/08/24 2330 06/09/24 0228 06/09/24 0417 06/09/24 0739  BP: (!) 137/49 115/89 (!) 99/57 110/60  Pulse: 77 61 74 62  Resp: 18  18   Temp: 98.6 F (37 C)  98.3 F (36.8 C) 98.2 F (36.8 C)  TempSrc: Oral  Oral   SpO2: 94% 95% 90% 98%  Weight:      Height:         Body mass index is 29.84 kg/m.  Physical Exam   Constitutional: Appears well-developed and well-nourished.  Psych: Affect appropriate to situation.  Eyes: No scleral injection.  HENT: No OP obstrucion.  Head: Normocephalic.  Cardiovascular: Normal rate and regular rhythm.  Respiratory: Effort normal, non-labored breathing.  Skin: WDI.   Neurologic Examination   Mental Status: Patient is awake, alert, oriented to person, place, month, year, and situation.  (6/10 cognitive assessment: 3 out of 3 registration, 1 out of 3 recall, 2 out of 3 with hints, able to name 12 animals with 4 feet when asked) Patient is able to give a clear and coherent history. No signs of aphasia or neglect Cranial Nerves: II: Left hemianopsia (patient endorses that this is fluctuating), with some impaired vision to the far right side of her peripheral vision as well. Pupils are equal, round, and reactive to light.   III,IV,  VI: Extraocular movements full, but with some difficulty moving eyes to the left, requiring additional prompting for this V: Facial sensation is symmetric to light touch VII: Facial movement is symmetric resting and smiling VIII: Hearing is intact to voice X: Palate elevates symmetrically XI: Shoulder shrug is symmetric. XII: Tongue protrudes midline without atrophy or fasciculations.  Motor: Tone is normal. Bulk is normal.  5/5 strength was present in all four extremities.  Sensory: Sensation is symmetric to light touch in the arms and legs.  Cerebellar: FNF with slight ataxia on the left, patient states that this is due to her vision and not coordination   Medications  Current Facility-Administered Medications:    0.9 %  sodium chloride  infusion, , Intravenous, Continuous, Sreeram, Narendranath, MD, Last Rate: 75 mL/hr at 06/09/24 0732, New Bag at 06/09/24 0732   acetaminophen  (TYLENOL ) tablet 650 mg, 650 mg, Oral, Q4H PRN, 650 mg at 06/09/24 0236 **OR** acetaminophen  (TYLENOL ) 160 MG/5ML solution 650 mg, 650 mg, Per Tube, Q4H PRN **OR** acetaminophen  (TYLENOL ) suppository 650 mg, 650 mg, Rectal, Q4H PRN, Sundil, Subrina, MD   aspirin EC tablet 81 mg, 81 mg, Oral, Daily, Consuelo Denmark, MD, 81 mg at 06/08/24 4098   atorvastatin  (LIPITOR) tablet 40 mg, 40 mg, Oral, Daily, Consuelo Denmark, MD, 40 mg at 06/08/24 1191   butalbital-acetaminophen -caffeine (FIORICET) 50-325-40 MG per tablet 1 tablet, 1 tablet, Oral, Q6H PRN, Sundil, Subrina, MD, 1 tablet at  06/09/24 0202   diphenhydrAMINE (BENADRYL) capsule 50 mg, 50 mg, Oral, Once **OR** diphenhydrAMINE (BENADRYL) injection 50 mg, 50 mg, Intravenous, Once, Sreeram, Narendranath, MD   diphenhydrAMINE (BENADRYL) injection 50 mg, 50 mg, Intravenous, Q6H PRN, Sundil, Subrina, MD   divalproex (DEPAKOTE) DR tablet 500 mg, 500 mg, Oral, Q12H, de Thayne Fine, Cortney E, NP, 500 mg at 06/08/24 2135   levETIRAcetam (KEPPRA) undiluted injection 1,500 mg, 1,500 mg,  Intravenous, Q12H, Deloria Brassfield, MD, 1,500 mg at 06/09/24 0835   levothyroxine  (SYNTHROID ) tablet 25 mcg, 25 mcg, Oral, Daily, Aisha Hove, MD, 25 mcg at 06/08/24 0834   predniSONE  (DELTASONE ) tablet 50 mg, 50 mg, Oral, Q6H, Sreeram, Narendranath, MD, 50 mg at 06/09/24 0960   senna-docusate (Senokot-S) tablet 1 tablet, 1 tablet, Oral, QHS PRN, Sundil, Subrina, MD, 1 tablet at 06/08/24 2202   sodium chloride  0.9 % bolus 500 mL, 500 mL, Intravenous, Once, Aisha Hove, MD, Held at 06/07/24 1418   sodium chloride  flush (NS) 0.9 % injection 3-10 mL, 3-10 mL, Intravenous, Q12H, Sundil, Subrina, MD, 10 mL at 06/08/24 0836   sodium chloride  flush (NS) 0.9 % injection 3-10 mL, 3-10 mL, Intravenous, PRN, Sundil, Subrina, MD   trimethobenzamide Ellouise Haagensen) injection 200 mg, 200 mg, Intramuscular, Q6H PRN, Sundil, Subrina, MD  Labs and Diagnostic Imaging   CBC:  Recent Labs  Lab 06/06/24 1458 06/06/24 1503 06/07/24 0347  WBC 11.2*  --  9.5  NEUTROABS 8.9*  --   --   HGB 15.7* 16.0* 13.6  HCT 46.0 47.0* 39.8  MCV 89.0  --  90.2  PLT 321  --  268    Basic Metabolic Panel:  Lab Results  Component Value Date   NA 140 06/09/2024   K 3.0 (L) 06/09/2024   CO2 25 06/09/2024   GLUCOSE 103 (H) 06/09/2024   BUN 17 06/09/2024   CREATININE 0.97 06/09/2024   CALCIUM  8.3 (L) 06/09/2024   GFRNONAA 58 (L) 06/09/2024   GFRAA 71 06/21/2019   Lipid Panel:  Lab Results  Component Value Date   LDLCALC 132 (H) 06/07/2024   Lab Results  Component Value Date   TSH 17.160 (H) 06/07/2024   FREET4 1.54 (H) 06/07/2024    ESR 9 Cyclic central peptide antibody 7 (negative) C4 complement 20 C3 complement 164 Antiscleroderma antibody less than 0.2 CRP 0.7 ANCA profile -negative Aldolase 4.5 CK 52 RPR non reactive Valproic acid level 114  ANA, IFA pending Lyme disease DNA pending Rheumatoid factor pending Paraneoplastic antibody pending QuantiFERON-TB pending  LTM EEG 6/10:  06/07/2024 1205 to 06/08/2024 1515  - Focal seizure, right occipital region - Background asymmetry, right<left - Intermittent slow, right>left occipital region  This study showed focal seizures without clinical signs arising from right occipital region. At the beginning of the study, average 7-10 seizures were noted per hour lasting duration of seizures was about 1 minute each.  Gradually her medications were adjusted, after around 2200, seizure frequency improved.  Subsequently average 1 seizure was noted per hour lasting about 1-2 minutes each.  Additionally there was evidence of cortical dysfunction in right > left occipital region likely secondary to underlying structural abnormality.  cEEG: 06/08/2024 1515 to 06/09/2024 0915  This study showed focal seizures without clinical signs arising from right occipital region. At the beginning of the study, average 3-4 seizures were noted per hour lasting about 1 minute each.  Gradually seizure frequency worsened to 5-7 seizures/hour. Subsequently after around 0300 on 06/10/2023, seizure frequency improve to 1-3 seizure per  hour lasting about 1 minute each. Additionally there was evidence of epileptogenicty arising from right occipital region  Lastly there was cortical dysfunction in right > left occipital region likely secondary to underlying structural abnormality.  Stroke work up:  TTE: 1. Left ventricular ejection fraction, by estimation, is 55 to 60%. The  left ventricle has normal function. The left ventricle has no regional  wall motion abnormalities. Left ventricular diastolic parameters are  consistent with Grade II diastolic dysfunction (pseudonormalization).   2. Right ventricular systolic function is normal. The right ventricular  size is normal. Tricuspid regurgitation signal is inadequate for assessing  PA pressure.   3. The mitral valve is normal in structure. No evidence of mitral valve  regurgitation. No evidence of mitral stenosis.   4.  The aortic valve is tricuspid. There is mild calcification of the  aortic valve. Aortic valve regurgitation is mild. No aortic stenosis is  present.   5. The inferior vena cava is normal in size with greater than 50%  respiratory variability, suggesting right atrial pressure of 3 mmHg.   6. Agitated saline contrast bubble study was negative, with no evidence  of any interatrial shunt.   EKG:  Atrial fibrillation with rapid ventricular response Minimal voltage criteria for LVH, may be normal variant ( Cornell product ) Inferior infarct , age undetermined ST & T wave abnormality, consider lateral ischemia Abnormal ECG  MRA of head: Negative intracranial MRA for large vessel occlusion or other emergent finding. No hemodynamically significant or correctable stenosis.  MRA of neck: 1. Negative MRA of the neck for large vessel occlusion or other emergent finding. 2. Mild for age atheromatous irregularity about the carotid bifurcations without hemodynamically significant stenosis.  Assessment  MAKYNZEE TIGGES is an 83 y.o. female with a PMHx of CAD, HTN, HLD, peripheral vascular disease, paroxysmal afib not on AC for at least 5 years due to 2/2 GI bleed, renal artery stenosis, and migraines with visual aura who presented with typical migraine headache symptoms along with nausea which was also typical of her migraine.  However, in addition, she reported having vertigo on Friday and vision changes.  Her neuroexam is notable for left hemianopsia, no meningismus, intact cognition and executive function. 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 Keppra was later added and Depakote will need to be uptitrated due to continued intermittent seizures on LTM EEG.  - MRI of the brain demonstrates multiple abnormalities including a right PCA stroke along with leptomeningeal/pial-arachnoid enhancement in the posterior left frontal lobe and high  left parietal lobe corresponding to area of abnormality on CT. In addition, there is diffuse dural enhancement on the right.  The right occipital lobe infarction may be due to an infiltrative process as there is subtle sulcal enhancement at this location as well.  - MRI of the cervical, thoracic and lumbar spine demonstrates subtle leptomeningeal enhancement about the cervical cord and thoracic spinal cord most prominently at T11-T12 with leptomeningeal enhancement of the distal cord as well - Ideally CSF studies are needed, will need CT Chest Abdomen and Pelvis to rule out primary malignancy. If this is negative we may need to engage neurosurgery for a biopsy  - EEGs: - Spot EEG: This study showed 2 focal seizures without clinical signs arising from right occipital region at 0825 and 0833 lasting for about 1 minute 10 seconds each. Additionally there was evidence of cortical dysfunction in right occipital region likely secondary  to underlying structural abnormality. - LTM read 6/10 demonstrated focal seizures in right oiccipital region. Depakote 500 mg BID was started at that time.  - LTM read 6/11 shows additional 3-4 seizures per hour. Increasing Depakote tonight to 750 mg BID and giving additional 500 mg dose now - Impression: - Differential for her lesions on MRI, with associated new onset seizures, is broad and includes CNS lymphoma, vasculitis, leptomeningeal carcinomatosis secondary to metastatic solid tumor, CNS TB (unlikely but possible) and neurosarcoidosis. Low suspicion for bacterial meningitis (other than possible TB), given that she is not encephalopathic. A fungal meningitis is also possible but unlikely given the lack of meningismus with intact mentation.  - DDx for her right occipital lobe stroke includes atrial fibrillation (see EKG result above) versus an infiltrative process resulting in a vasculopathy at this location (see MRI discussion above).    Recommendations  - ANA with IFA,  dsDNA, Lyme PCR, autoimmune encephalopathy panel, serum ACE, QuantiFERON gold plus.   - Increase Depakote to 750mg  BID tonight and one time dose of 500mg , check valproic acid level and ammonia Friday morning - Continue with scheduled Keppra 500 mg IV BID.  - CT Chest Abdomen Pelvis w contrast  Patient seen and examined by NP/APP.   Imogene Mana, DNP, FNP-BC [facility] Pager: [phone]  Electronically signed: Dr. Daniesha Driver

## 2024-06-09 NOTE — Care Management Important Message (Signed)
 Important Message  Patient Details  Name: JOHANNE MCGLADE MRN: [id number] Date of Birth: 1941/06/01   Important Message Given:  Yes - Medicare IM     Wynonia Hedges 06/09/2024, 11:36 AM

## 2024-06-09 NOTE — Progress Notes (Signed)
 Pt states she can feel her heart racing after using BSC. Pt HR on cardiac monitor 142/AFIB. MD contacted, EKG ordered and performed: Abnormal w/Afib HR 137. MD ordered 5mg  IV metoprolol  and then 25mg  PO metoprolol . Pt asking if she had received her normal 50mg  BID metoprolol . Informed patient that due to hypotension and positive orthostatics per MD note, her metoprolol  had been on hold. Pt asking if she will be restarted on home medication. Pt states she had been home for 4 days dehydrated due to minimal PO intake due to N/V from migraine. Pt encouraged to increase PO in addition to IVF.       06/09/24 0220 06/09/24 0221 06/09/24 0222  Assess: MEWS Score  BP  --   --   --   MAP (mmHg)  --   --   --   Pulse Rate  --   --   --   ECG Heart Rate (!) 142 (!) 119 (!) 126  SpO2  --   --   --   O2 Device  --   --   --   Assess: MEWS Score  MEWS Temp 0 0 0  MEWS Systolic 0 0 0  MEWS Pulse 3 2 2   MEWS RR 0 0 0  MEWS LOC 0 0 0  MEWS Score 3 2 2   MEWS Score Color Yellow Yellow Yellow  Assess: if the MEWS score is Yellow or Red  Were vital signs accurate and taken at a resting state? Yes  --   --   Does the patient meet 2 or more of the SIRS criteria? No  --   --   Provider Notification  Provider Name/Title Dr. Zack Hero  --   --   Date Provider Notified 06/09/24  --   --   Time Provider Notified 0221  --   --   Method of Notification Page  --   --   Notification Reason Change in status;New onset of dysrhythmia  --   --   Provider response See new orders (EKG)  --   --   Date of Provider Response 06/09/24  --   --   Time of Provider Response 0222  --   --   Assess: SIRS CRITERIA  SIRS Temperature  0 0 0  SIRS Respirations  0 0 0  SIRS Pulse 1 1 1   SIRS WBC 0 0 0  SIRS Score Sum  1 1 1     06/09/24 0228  Assess: MEWS Score  BP 115/89  MAP (mmHg) 98  Pulse Rate 61  ECG Heart Rate  --   SpO2 95 %  O2 Device Room Air  Assess: MEWS Score  MEWS Temp 0  MEWS Systolic 0  MEWS Pulse 0   MEWS RR 0  MEWS LOC 0  MEWS Score 0  MEWS Score Color Green  Assess: if the MEWS score is Yellow or Red  Were vital signs accurate and taken at a resting state?  --   Does the patient meet 2 or more of the SIRS criteria?  --   Provider Notification  Provider Name/Title  --   Date Provider Notified  --   Time Provider Notified  --   Method of Notification  --   Notification Reason  --   Provider response  --   Date of Provider Response  --   Time of Provider Response  --   Assess: SIRS CRITERIA  SIRS Temperature  0  SIRS Respirations  0  SIRS Pulse 0  SIRS WBC 0  SIRS Score Sum  0

## 2024-06-09 NOTE — Procedures (Addendum)
 Patient Name: Gabriela Moyer  MRN: [id number]  Epilepsy Attending: Arleene Lack  Referring Physician/Provider: Arleene Lack, MD  Duration: 06/08/2024 1515 to 06/09/2024 1515   Patient history: 83 y.o. female with visual disturbance. EEG to evaluate for seizure.    Level of alertness: Awake, asleep   AEDs during EEG study: LEV, VPA   Technical aspects: This EEG study was done with scalp electrodes positioned according to the 10-20 International system of electrode placement. Electrical activity was reviewed with band pass filter of 1-70Hz , sensitivity of 7 uV/mm, display speed of 32mm/sec with a 60Hz  notched filter applied as appropriate. EEG data were recorded continuously and digitally stored.  Video monitoring was available and reviewed as appropriate.   Description: The posterior dominant rhythm consists of 8-9 Hz activity of moderate voltage (25-35 uV) seen predominantly in posterior head regions, asymmetric ( right<left) and reactive to eye opening and eye closing.  Sleep was characterized by vertex waves, sleep spindles (12-15Hz ), maximal frontocentral region.  EEG showed rhythmic sharply contoured 3 to 6 Hz theta-delta slowing in right>left occipital region. Lateralized periodic discharges with fluctuating frequency of 0.25-1hz  were also noted in right occipital region. Hyperventilation and photic stimulation were not performed.      Frequent seizures without clinical signs were noted during the study.  During the seizure EEG showed 12 to 13 Hz sharply contoured beta activity in right occipital region which then involve left occipital region and evolved into high amplitude sharply contoured 5 to 6 Hz theta slowing followed by 2 to 3 Hz delta slowing.  At the beginning of the study, average 3-4 seizures were noted per hour lasting about 1 minute each.  Gradually seizure frequency worsened to 5-7 seizures/hour. Subsequently after around 0300 on 06/10/2023, seizure frequency improve to 1-3  seizure per hour lasting about 1 minute each.    ABNORMALITY - Focal seizure, right occipital region -Lateralized periodic discharges, right occipital region.  - Background asymmetry, right<left - Intermittent slow, right>left occipital region   IMPRESSION: This study showed focal seizures without clinical signs arising from right occipital region. At the beginning of the study, average 3-4 seizures were noted per hour lasting about 1 minute each.  Gradually seizure frequency worsened to 5-7 seizures/hour. Subsequently after around 0300 on 06/10/2023, seizure frequency improve to 1-3 seizure per hour lasting about 1 minute each.    Additionally there was evidence of epileptogenicty arising from right occipita region  Lastly there was cortical dysfunction in right > left occipital region likely secondary to underlying structural abnormality.   Cortnee Steinmiller O Smitty Ackerley

## 2024-06-09 NOTE — Plan of Care (Signed)

## 2024-06-09 NOTE — Progress Notes (Signed)
 Inpatient Rehab Admissions Coordinator Note:   Per updated therapy recommendations patient was screened for CIR candidacy by Mickey Alar, PT. At this time, pt appears to be a potential candidate for CIR. I will place an order for rehab consult for full assessment, per our protocol.  Please contact me any with questions.Loye Rumble, PT, DPT [phone] 06/09/24 4:07 PM

## 2024-06-09 NOTE — Progress Notes (Signed)
 Occupational Therapy Treatment Patient Details Name: Gabriela Moyer MRN: [id number] DOB: 10-Feb-1941 Today's Date: 06/09/2024   History of present illness Pt is an 83 y/o F admitted on 06/06/24 after presenting with c/o HA x 4 days, as well as vertigo & vision changes.  MRI brain w + w /o C which demonstrated leptomeningeal/pial-arachnoid enhancement in the posterior left frontal lobe and high left parietal lobe corresponding to area of abnormality on CT. 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 PMH: chronic migraine, HTN, HLD, extensive PAD of subclavian, axillary, brachial & renal artery s/p R renal artery stent 2010, hypothyroidism, paroxysmal a-fib.   OT comments  Pt is making steady progress towards their acute OT goals. She continues to have significant L visual field deficits that increase her fall risk. Pt unable to verbalize awareness of L visual field deficit prior to education. During functional mobility with RW, pt had L lateral drift and ran into objects on the L side without significant carryover of scanning strategy or safety awareness despite cues. While sitting, pt also required maximal cues to carry over scanning strategies to locate items on the L side of the tray. OT to continue to follow acutely to facilitate progress towards established goals. Pt will benefit from intensive inpatient follow up therapy, >3 hours/day after discharge.        If plan is discharge home, recommend the following:  A little help with walking and/or transfers;A little help with bathing/dressing/bathroom;Assist for transportation;Help with stairs or ramp for entrance   Equipment Recommendations  None recommended by OT       Precautions / Restrictions Precautions Precautions: Fall Precaution/Restrictions Comments: left visual field cut, orthostatic Restrictions Weight Bearing Restrictions Per Provider Order: No       Mobility Bed Mobility       General bed  mobility comments: OOB on arrival, returned to bed    Transfers Overall transfer level: Needs assistance Equipment used: Rolling walker (2 wheels) Transfers: Sit to/from Stand Sit to Stand: Contact guard assist           General transfer comment: CGA, requires assist during ambulation to avoid obstacles in L environment     Balance Overall balance assessment: Needs assistance Sitting-balance support: Feet supported Sitting balance-Leahy Scale: Fair     Standing balance support: Single extremity supported, During functional activity Standing balance-Leahy Scale: Fair           ADL either performed or assessed with clinical judgement   ADL Overall ADL's : Needs assistance/impaired Eating/Feeding: Minimal assistance Eating/Feeding Details (indicate cue type and reason): minA to locate items on the L side of tray table           Toilet Transfer: Minimal assistance Toilet Transfer Details (indicate cue type and reason): min A to correct bumping into things on her L side         Functional mobility during ADLs: Minimal assistance;Rolling walker (2 wheels) General ADL Comments: pt requires assist to manage RW to avoid bumping into things in the L environment    Extremity/Trunk Assessment Upper Extremity Assessment Upper Extremity Assessment: Overall WFL for tasks assessed   Lower Extremity Assessment Lower Extremity Assessment: Defer to PT evaluation        Vision   Vision Assessment?: Yes Eye Alignment: Within Functional Limits Ocular Range of Motion: Within Functional Limits Alignment/Gaze Preference: Within Defined Limits Tracking/Visual Pursuits: Other (comment) Saccades: Additional head turns occurred during testing Convergence: Within functional limits Visual Fields: Left visual  field deficit Additional Comments: continues to demonstrate L visual field cut; utilized scanning during functional tasks   Perception Perception Perception:  Impaired Preception Impairment Details: Inattention/Neglect   Praxis Praxis Praxis: WFL   Communication Communication Communication: No apparent difficulties   Cognition Arousal: Alert Behavior During Therapy: WFL for tasks assessed/performed Cognition: Cognition impaired     Awareness: Online awareness impaired     Executive functioning impairment (select all impairments): Problem solving OT - Cognition Comments: difficulty with carryover of visual scanning strategies                 Following commands: Intact        Cueing   Cueing Techniques: Verbal cues        General Comments BP supine 132/54, sitting: 121/62, standing 109/65, after 3 min 93/52    Pertinent Vitals/ Pain       Pain Assessment Pain Assessment: No/denies pain   Frequency  Min 2X/week        Progress Toward Goals  OT Goals(current goals can now be found in the care plan section)  Progress towards OT goals: Progressing toward goals  Acute Rehab OT Goals Patient Stated Goal: ot go home OT Goal Formulation: With patient Time For Goal Achievement: 06/07/24 Potential to Achieve Goals: Good ADL Goals Pt Will Perform Grooming: with supervision;standing Pt Will Perform Lower Body Dressing: with supervision;sit to/from stand Pt Will Transfer to Toilet: with supervision;ambulating;regular height toilet Pt Will Perform Toileting - Clothing Manipulation and hygiene: with supervision;sit to/from stand Pt Will Perform Tub/Shower Transfer: with supervision;Tub transfer;shower seat;ambulating;rolling walker Additional ADL Goal #1: Pt will complete ADLs with dizziness reported at less than or equal to 3/10. Additional ADL Goal #2: Pt will locate items left of midline in the hallway or at the sink with 75% accurracy and no more than min instructional cueing for eye and head turns.  Plan         AM-PAC OT 6 Clicks Daily Activity     Outcome Measure   Help from another person eating meals?: A  Little Help from another person taking care of personal grooming?: A Little Help from another person toileting, which includes using toliet, bedpan, or urinal?: A Little Help from another person bathing (including washing, rinsing, drying)?: A Little Help from another person to put on and taking off regular upper body clothing?: A Little Help from another person to put on and taking off regular lower body clothing?: A Little 6 Click Score: 18    End of Session Equipment Utilized During Treatment: Gait belt;Rolling walker (2 wheels)  OT Visit Diagnosis: Unsteadiness on feet (R26.81);Muscle weakness (generalized) (M62.81);Dizziness and giddiness (R42)   Activity Tolerance Patient tolerated treatment well   Patient Left in chair;with call bell/phone within reach   Nurse Communication Mobility status        Time: 8119-1478 OT Time Calculation (min): 16 min  Charges: OT General Charges $OT Visit: 1 Visit OT Treatments $Therapeutic Activity: 8-22 mins  Veryl Gottron, OTR/L Acute Rehabilitation Services Office [phone] Secure Chat Communication Preferred   Starla Easterly 06/09/2024, 2:49 PM

## 2024-06-09 NOTE — Progress Notes (Signed)
 Progress Note   Patient: Gabriela Moyer DOB: [birth year] DOA: 06/06/2024     3 DOS: the patient was seen and examined on 06/09/2024   Brief hospital course: Gabriela Moyer is a 83 y.o. female with medical history significant of chronic migraine, essential hypertension, hyperlipidemia, extensive  PAD of subclavian, axillary, brachial and renal artery status post right renal artery stent 2010, hypothyroidism and paroxysmal afibrillation not on Xarelto  due to history of GI bleed recent emergency department complaining of headache for 4 days which is right-sided with associated vertigo, some vision change with the onset of the headache.   CT head: Amorphous high-density areas noted in the left frontal region along the cortex and sulci. It is difficult to determine if this is subarachnoid hemorrhage or within the cortex.   6 mm small hyperdense area adjacent to the right lateral ventricle concerning for small brain lesion.   Low-density area in the right occipital lobe could reflect infarct or edema related to underlying lesion.   MRI brain showed: Leptomeningeal/pial-arachnoid enhancement in the posterior left frontal lobe and high left parietal lobe corresponding to area of abnormality on CT. No definite underlying parenchymal enhancement or edema. 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 Findings most concerning for meningitis and possible focus of encephalitis in the right occipital lobe. Recommend lumbar puncture for further evaluation.   Differential could also include leptomeningeal carcinomatosis. Complex migraine and associated vasospasm could potentially lead to infarct although the degree of enhancement especially in the frontoparietal lobes is atypical.   Diffuse dural enhancement noted.   Nodule along the surface of the right lateral ventricle with signal intensity similar to cortical  gray matter without associated enhancement, suggestive of gray matter heterotopia.   Unremarkable MRI of the orbits.  No findings of optic neuritis. Admitted for further management and neurological evaluation.  During hospital stay, EEG showed status epileptius, started Keprra, depakote as per neurology recommendations. MRI spine - Suspected subtle leptomeningeal enhancement about the distal cord. Finding is nonspecific, and could be either infectious or inflammatory in nature. Possible neoplastic process with leptomeningeal carcinomatosis could also be considered. 5 attempts of LP failed.   Assessment and Plan: Right sided vision deficit. Presented with migraine headache, nausea, vertigo, right visual field cut. Reviewed imaging studies concerning for leptomeningeal process rule out infectious etiology, ?malignancy. EEG last night showed multiple seizure lasting 1 min without clinical signs. Neurology recommended loading Keppra with 1500bid, Depakote 500 q12.  5 attempts to obtain LP unsuccessful. CT chest/ abdomen/ pelvis rule out malignancy ordered. She has contrast allergy, premeds in. If malignancy ruled out neurology advised neurosurgery to get tissue for path. Hold Plavix  as she will get LP for further evaluation of her MRI findings. Follow neurology recommendations. PT/ OT evaluation.  Hypokalemia: Oral potassium supplements bid ordered. Continue to monitor and replete as needed.  Hypothyroidism: TSH high, t3 pending. T4 1.54 Home dose synthroid  resumed.  Acute migraine headache with associated nausea Continue Fioricet as needed.  Paroxysmal Afib Had RVR last night. Resumed Toprol  xl 25 daily. Not on anticoagulation due to gi bleed. Continue telemetry monitoring.  QT prolongation: Avoid QT prolonging drugs. Continue Tigan as needed for nausea.  Hypotension. Orthostatic vitals positive. Resumed Toprol  xl 25 daily as she has Afib last night. Hold toprol  as she has  bradycardia. Monitor her vitals closely.    Out of bed to chair. Incentive spirometry. Nursing supportive care. Fall, aspiration precautions. Diet:  Diet Orders (From  admission, onward)     Start     Ordered   06/06/24 2138  Diet Heart Fluid consistency: Thin  Diet effective now       Question:  Fluid consistency:  Answer:  Thin   06/06/24 2137           DVT prophylaxis: Place and maintain sequential compression device Start: 06/07/24 1849 Place TED hose Start: 06/07/24 1849  Level of care: Telemetry Medical   Code Status: Full Code  Subjective: Patient is seen and examined today morning. She denies any complaints. Last night HR high, asks for beta blocker. Sister at bedside.  Physical Exam: Vitals:   06/09/24 0417 06/09/24 0739 06/09/24 1227 06/09/24 1642  BP: (!) 99/57 110/60 121/76 (!) 117/56  Pulse: 74 62 78 (!) 58  Resp: 18     Temp: 98.3 F (36.8 C) 98.2 F (36.8 C) 98 F (36.7 C) 97.6 F (36.4 C)  TempSrc: Oral  Oral Oral  SpO2: 90% 98% 96% 93%  Weight:      Height:        General - Elderly ill Caucasian female, no acute distress. HEENT - PERRLA, EOMI, atraumatic head, non tender sinuses. Lung - Clear, basal rales, rhonchi, no wheezes. Heart - S1, S2 heard, no murmurs, rubs, trace pedal edema. Abdomen - Soft, non tender, bowel sounds good Neuro - Alert, awake and oriented, non focal exam. Skin - Warm and dry.  Data Reviewed:      Latest Ref Rng & Units 06/07/2024    3:47 AM 06/06/2024    3:03 PM 06/06/2024    2:58 PM  CBC  WBC 4.0 - 10.5 K/uL 9.5   11.2   Hemoglobin 12.0 - 15.0 g/dL 13.0  86.5  78.4   Hematocrit 36.0 - 46.0 % 39.8  47.0  46.0   Platelets 150 - 400 K/uL 268   321       Latest Ref Rng & Units 06/09/2024    4:43 AM 06/07/2024    3:47 AM 06/06/2024    3:03 PM  BMP  Glucose 70 - 99 mg/dL 696  96  295   BUN 8 - 23 mg/dL 17  19  26    Creatinine 0.44 - 1.00 mg/dL 2.84  1.32  4.40   Sodium 135 - 145 mmol/L 140  137  138   Potassium 3.5  - 5.1 mmol/L 3.0  3.3  3.3   Chloride 98 - 111 mmol/L 106  101  96   CO2 22 - 32 mmol/L 25  23    Calcium  8.9 - 10.3 mg/dL 8.3  9.2     DG FL GUIDED LUMBAR PUNCTURE Result Date: 06/08/2024 CLINICAL DATA:  Provided history: Seizures. Request received for fluoroscopically-guided lumbar puncture. EXAM: LUMBAR PUNCTURE UNDER FLUOROSCOPY PROCEDURE: The patient was positioned prone on the fluoroscopy table. An appropriate skin entry site was determined under fluoroscopy and marked. The operator donned sterile gloves and a mask. The lower back was prepped and draped in the usual sterile fashion. Local anesthesia was provided with 1% lidocaine . Under intermittent fluoroscopy, lumbar puncture was performed at the L2-L3, L3-L4 and L5-S1 levels using a 20 gauge spinal needle. However, there was no CSF return at these levels. An additional lumbar puncture attempt was offered to the patient, but the patient elected to terminate the procedure. The inner stylet was replaced within the needle and the needle was removed in its entirety. Dressings were applied to the skin entry sites. No immediate post-procedure complication  was apparent. The procedure was performed by Lambert Pillion, PA-C, supervised by Dr. Bascom Lily. FLUOROSCOPY: Radiation Exposure Index (as provided by the fluoroscopic device): 28.8 mGy Kerma IMPRESSION: 1. Fluoroscopically-guided lumbar punctures performed at the L2-L3, L3-L4 and L5-S1 levels without CSF return. 2. No immediate post-procedure complication. Electronically Signed   By: Bascom Lily D.O.   On: 06/08/2024 14:28   Overnight EEG with video Result Date: 06/08/2024 Arleene Lack, MD     06/09/2024  9:34 AM Patient Name: ELYSE PREVO MRN: [id number] Epilepsy Attending: Arleene Lack Referring Physician/Provider: Arleene Lack, MD Duration: 06/07/2024 1205 to 06/08/2024 1515 Patient history: 83 y.o. female with visual disturbance. EEG to evaluate for seizure.  Level of alertness: Awake,  asleep  AEDs during EEG study: LEV, VPA  Technical aspects: This EEG study was done with scalp electrodes positioned according to the 10-20 International system of electrode placement. Electrical activity was reviewed with band pass filter of 1-70Hz , sensitivity of 7 uV/mm, display speed of 87mm/sec with a 60Hz  notched filter applied as appropriate. EEG data were recorded continuously and digitally stored.  Video monitoring was available and reviewed as appropriate.  Description: The posterior dominant rhythm consists of 8-9 Hz activity of moderate voltage (25-35 uV) seen predominantly in posterior head regions, asymmetric ( right<left) and reactive to eye opening and eye closing.  Sleep was characterized by vertex waves, sleep spindles (12-15Hz ), maximal frontocentral region.  EEG showed rhythmic sharply contoured 3 to 6 Hz theta-delta slowing in right>left occipital region   Hyperventilation and photic stimulation were not performed.    Frequent seizures without clinical signs were noted during the study.  During the seizure EEG showed 12 to 13 Hz sharply contoured beta activity in right occipital region which then involve left occipital region and evolved into high amplitude sharply contoured 5 to 6 Hz theta slowing followed by 2 to 3 Hz delta slowing.  At the beginning of the study, average 7-10 seizures were noted per hour lasting duration of seizures was about 1 minute each.  Gradually as medications were adjusted, after around 2200, seizure frequency improved.  Subsequently average 1 seizure was noted per hour lasting about 1-2 minutes each. After around 0900, seizure frequency worsened and eeg showed 3-4 seizures/hour lasting about 1 minute each. EEG was disconnected on 06/07/2024 between 1601 to 1911 for imaging.  ABNORMALITY - Focal seizure, right occipital region - Background asymmetry, right<left - Intermittent slow, right>left occipital region  IMPRESSION: This study showed focal seizures without clinical  signs arising from right occipital region. At the beginning of the study, average 7-10 seizures were noted per hour lasting duration of seizures was about 1 minute each.  Gradually his medications were adjusted, after around 2200, seizure frequency improved.  Subsequently average 1 seizure was noted per hour lasting about 1-2 minutes each.  After around 0900, seizure frequency worsened and eeg showed 3-4 seizures/hour lasting about 1 minute each.  Additionally there was evidence of cortical dysfunction in right > left occipital region likely secondary to underlying structural abnormality.  Dr. Lindzen And Dr Arora were intermittently notified.   Priyanka Suzanne Erps     Family Communication: Discussed with patient, sister at bedside. They understand and agree. All questions answered.  Disposition: Status is: Inpatient Remains inpatient appropriate because: neurology work up, low BP, high HR, orthostatic. Pending LP  Planned Discharge Destination: Home with Home Health     Time spent: 40 minutes  Author: Aisha Hove, MD 06/09/2024 6:41 PM Secure  chat 7am to 7pm For on call review [email or web address].

## 2024-06-10 ENCOUNTER — Inpatient Hospital Stay (HOSPITAL_COMMUNITY)

## 2024-06-10 DIAGNOSIS — I639 Cerebral infarction, unspecified: Secondary | ICD-10-CM | POA: Diagnosis not present

## 2024-06-10 DIAGNOSIS — G40909 Epilepsy, unspecified, not intractable, without status epilepticus: Secondary | ICD-10-CM | POA: Diagnosis not present

## 2024-06-10 DIAGNOSIS — N179 Acute kidney failure, unspecified: Secondary | ICD-10-CM | POA: Diagnosis not present

## 2024-06-10 DIAGNOSIS — I480 Paroxysmal atrial fibrillation: Secondary | ICD-10-CM | POA: Diagnosis not present

## 2024-06-10 DIAGNOSIS — I10 Essential (primary) hypertension: Secondary | ICD-10-CM | POA: Diagnosis not present

## 2024-06-10 DIAGNOSIS — Z8679 Personal history of other diseases of the circulatory system: Secondary | ICD-10-CM | POA: Diagnosis not present

## 2024-06-10 DIAGNOSIS — R9089 Other abnormal findings on diagnostic imaging of central nervous system: Secondary | ICD-10-CM | POA: Diagnosis not present

## 2024-06-10 DIAGNOSIS — R569 Unspecified convulsions: Secondary | ICD-10-CM | POA: Diagnosis not present

## 2024-06-10 LAB — QUANTIFERON-TB GOLD PLUS (RQFGPL)
QuantiFERON Mitogen Value: >10 [IU]/mL
QuantiFERON Nil Value: 0.29 [IU]/mL
QuantiFERON TB1 Ag Value: 0.24 [IU]/mL
QuantiFERON TB2 Ag Value: 0.36 [IU]/mL

## 2024-06-10 LAB — QUANTIFERON-TB GOLD PLUS: QuantiFERON-TB Gold Plus: NEGATIVE

## 2024-06-10 LAB — T3, FREE: T3, Free: 2.1 pg/mL (ref 2.0–4.4)

## 2024-06-10 LAB — POTASSIUM: Potassium: 3.9 mmol/L (ref 3.5–5.1)

## 2024-06-10 MED ORDER — DIPHENHYDRAMINE HCL 25 MG PO CAPS: 50.0000 mg | ORAL_CAPSULE | Freq: Four times a day (QID) | ORAL | Status: DC | PRN

## 2024-06-10 NOTE — TOC Progression Note (Signed)
 Transition of Care [facility]) - Progression Note    Patient Details  Name: Gabriela Moyer MRN: [id number] Date of Birth: 1941/09/08  [facility] CM/SW Contact  Jonathan Neighbor, RN Phone Number: 06/10/2024, 1:28 PM  Clinical Narrative:     Recommendations have changed to CIR. CIR is starting insurance for admission. TOC following.  Expected Discharge Plan: IP Rehab Facility    Expected Discharge Plan and Services                                               Social Determinants of Health (SDOH) Interventions SDOH Screenings   Food Insecurity: No Food Insecurity (06/08/2024)  Housing: Low Risk  (06/08/2024)  Transportation Needs: No Transportation Needs (06/08/2024)  Utilities: Not At Risk (06/08/2024)  Social Connections: Socially Isolated (06/08/2024)  Tobacco Use: Medium Risk (06/06/2024)    Readmission Risk Interventions     No data to display

## 2024-06-10 NOTE — Progress Notes (Signed)
 NEUROLOGY CONSULT FOLLOW UP NOTE   Date of service: June 10, 2024 Patient Name: Gabriela Moyer MRN:  [id number] DOB:  01-18-1941  Interval Hx/subjective  No complaints  Vitals   Vitals:   06/09/24 1923 06/09/24 2331 06/10/24 0358 06/10/24 0828  BP: 125/79 103/79 115/69 (!) 145/78  Pulse: 66 65 66 72  Resp: 18 18 18 18   Temp: 97.9 F (36.6 C) 97.6 F (36.4 C) 97.8 F (36.6 C) 97.6 F (36.4 C)  TempSrc: Oral   Oral  SpO2: 95% 100% 97% 98%  Weight:      Height:         Body mass index is 29.84 kg/m.  Physical Exam   Constitutional: Appears well-developed and well-nourished.  Psych: Affect appropriate to situation.  Eyes: No scleral injection.  HENT: No OP obstrucion.  Head: Normocephalic. EEG leads in place.  Respiratory: Effort normal, non-labored breathing.    Neurologic Examination   Mental Status: Awake and alert. Oriented to the month, day of week, year, city, state and circumstance. Becomes confused when asked to recall dates associated with prior events, stating 1966 for a prior cardiac catheterization, which is an unlikely date, then attempting to correct herself by stating 1906 for the year of the procedure. Speech is fluent with intact comprehension for general questions and all motor commands.  Cranial Nerves: II: Left homonymous hemianopsia.   III,IV, VI: No ptosis. EOMI. No nystagmus.  VII: Smile symmetric VIII: Hearing intact to conversation IX,X: No hoarseness or hypophonia XI: Symmetric XII: No lingual dysarthria Motor: Right : Upper extremity   5/5    Left:     Upper extremity   5/5  Lower extremity   5/5     Lower extremity   5/5 No pronator drift Sensory: Light touch intact throughout, bilaterally. No extinction to DSS.  Cerebellar: No ataxia with FNF bilaterally Gait: Deferred  Medications  Current Facility-Administered Medications:    acetaminophen  (TYLENOL ) tablet 650 mg, 650 mg, Oral, Q4H PRN, 650 mg at 06/09/24 0236 **OR**  acetaminophen  (TYLENOL ) 160 MG/5ML solution 650 mg, 650 mg, Per Tube, Q4H PRN **OR** acetaminophen  (TYLENOL ) suppository 650 mg, 650 mg, Rectal, Q4H PRN, Sundil, Subrina, MD   aspirin EC tablet 81 mg, 81 mg, Oral, Daily, Consuelo Denmark, MD, 81 mg at 06/09/24 1011   atorvastatin  (LIPITOR) tablet 40 mg, 40 mg, Oral, Daily, Consuelo Denmark, MD, 40 mg at 06/09/24 1011   butalbital-acetaminophen -caffeine (FIORICET) 50-325-40 MG per tablet 1 tablet, 1 tablet, Oral, Q6H PRN, Sundil, Subrina, MD, 1 tablet at 06/09/24 0202   diphenhydrAMINE (BENADRYL) injection 50 mg, 50 mg, Intravenous, Q6H PRN, Sundil, Subrina, MD   divalproex (DEPAKOTE) DR tablet 750 mg, 750 mg, Oral, Q12H, Shafer, Devon, NP, 750 mg at 06/09/24 2142   levETIRAcetam (KEPPRA) undiluted injection 1,500 mg, 1,500 mg, Intravenous, Q12H **OR** levETIRAcetam (KEPPRA) tablet 1,500 mg, 1,500 mg, Oral, Q12H, Shafer, Devon, NP, 1,500 mg at 06/09/24 2037   levothyroxine  (SYNTHROID ) tablet 25 mcg, 25 mcg, Oral, Daily, Sreeram, Narendranath, MD, 25 mcg at 06/09/24 1011   metoprolol  succinate (TOPROL -XL) 24 hr tablet 25 mg, 25 mg, Oral, Daily, Sreeram, Narendranath, MD, 25 mg at 06/09/24 1240   potassium chloride  SA (KLOR-CON  M) CR tablet 40 mEq, 40 mEq, Oral, BID, Sreeram, Narendranath, MD, 40 mEq at 06/09/24 2142   senna-docusate (Senokot-S) tablet 1 tablet, 1 tablet, Oral, QHS PRN, Sundil, Subrina, MD, 1 tablet at 06/08/24 2202   sodium chloride  0.9 % bolus 500 mL, 500 mL, Intravenous, Once, Sreeram,  Sundra Engel, MD, Held at 06/07/24 1418   sodium chloride  flush (NS) 0.9 % injection 3-10 mL, 3-10 mL, Intravenous, Q12H, Sundil, Subrina, MD, 10 mL at 06/09/24 2144   sodium chloride  flush (NS) 0.9 % injection 3-10 mL, 3-10 mL, Intravenous, PRN, Sundil, Subrina, MD   trimethobenzamide Ellouise Haagensen) injection 200 mg, 200 mg, Intramuscular, Q6H PRN, Sundil, Subrina, MD  Labs and Diagnostic Imaging   CBC:  Recent Labs  Lab 06/06/24 1458 06/06/24 1503  06/07/24 0347  WBC 11.2*  --  9.5  NEUTROABS 8.9*  --   --   HGB 15.7* 16.0* 13.6  HCT 46.0 47.0* 39.8  MCV 89.0  --  90.2  PLT 321  --  268    Basic Metabolic Panel:  Lab Results  Component Value Date   NA 140 06/09/2024   K 3.0 (L) 06/09/2024   CO2 25 06/09/2024   GLUCOSE 103 (H) 06/09/2024   BUN 17 06/09/2024   CREATININE 0.97 06/09/2024   CALCIUM  8.3 (L) 06/09/2024   GFRNONAA 58 (L) 06/09/2024   GFRAA 71 06/21/2019   Lipid Panel:  Lab Results  Component Value Date   LDLCALC 132 (H) 06/07/2024   HgbA1c:  Lab Results  Component Value Date   HGBA1C 5.5 06/07/2024   Urine Drug Screen: No results found for: LABOPIA, COCAINSCRNUR, LABBENZ, AMPHETMU, THCU, LABBARB  Alcohol  Level     Component Value Date/Time   [facility] <15 06/06/2024 1458   INR  Lab Results  Component Value Date   INR 1.0 06/06/2024   APTT  Lab Results  Component Value Date   APTT 27 06/06/2024   CT of chest/abdomen/pelvis with contrast: - CT of the chest: Mild bibasilar atelectasis. - Less than 6 mm right solid pulmonary nodule within the upper lobe. - CT of the abdomen and pelvis: Diverticulosis without diverticulitis. - Left renal stone in the upper pole measuring 10 mm with associated scarring. - No other focal abnormality is noted.  Labs:  ESR 9 Cyclic central peptide antibody 7 (negative) C4 complement 20 C3 complement 164 Antiscleroderma antibody less than 0.2 CRP 0.7 ANCA profile -negative Aldolase 4.5 CK 52 RPR non reactive Valproic acid level 114 ANA, IFA pending Lyme disease DNA pending Rheumatoid factor pending Paraneoplastic antibody pending QuantiFERON-TB pending   LTM EEG 6/10: 06/07/2024 1205 to 06/08/2024 1515  - Focal seizure, right occipital region - Background asymmetry, right<left - Intermittent slow, right>left occipital region  This study showed focal seizures without clinical signs arising from right occipital region. At the beginning of the  study, average 7-10 seizures were noted per hour lasting duration of seizures was about 1 minute each.  Gradually her medications were adjusted, after around 2200, seizure frequency improved.  Subsequently average 1 seizure was noted per hour lasting about 1-2 minutes each.  Additionally there was evidence of cortical dysfunction in right > left occipital region likely secondary to underlying structural abnormality.   cEEG: 06/08/2024 1515 to 06/09/2024 0915  This study showed focal seizures without clinical signs arising from right occipital region. At the beginning of the study, average 3-4 seizures were noted per hour lasting about 1 minute each.  Gradually seizure frequency worsened to 5-7 seizures/hour. Subsequently after around 0300 on 06/10/2023, seizure frequency improve to 1-3 seizure per hour lasting about 1 minute each. Additionally there was evidence of epileptogenicty arising from right occipital region  Lastly there was cortical dysfunction in right > left occipital region likely secondary to underlying structural abnormality.   Stroke work up:  TTE: 1. Left ventricular ejection fraction, by estimation, is 55 to 60%. The  left ventricle has normal function. The left ventricle has no regional  wall motion abnormalities. Left ventricular diastolic parameters are  consistent with Grade II diastolic dysfunction (pseudonormalization).   2. Right ventricular systolic function is normal. The right ventricular  size is normal. Tricuspid regurgitation signal is inadequate for assessing  PA pressure.   3. The mitral valve is normal in structure. No evidence of mitral valve  regurgitation. No evidence of mitral stenosis.   4. The aortic valve is tricuspid. There is mild calcification of the  aortic valve. Aortic valve regurgitation is mild. No aortic stenosis is  present.   5. The inferior vena cava is normal in size with greater than 50%  respiratory variability, suggesting right atrial  pressure of 3 mmHg.   6. Agitated saline contrast bubble study was negative, with no evidence  of any interatrial shunt.    EKG:  Atrial fibrillation with rapid ventricular response Minimal voltage criteria for LVH, may be normal variant ( Cornell product ) Inferior infarct , age undetermined ST & T wave abnormality, consider lateral ischemia Abnormal ECG   MRA of head: Negative intracranial MRA for large vessel occlusion or other emergent finding. No hemodynamically significant or correctable stenosis.   MRA of neck: 1. Negative MRA of the neck for large vessel occlusion or other emergent finding. 2. Mild for age atheromatous irregularity about the carotid bifurcations without hemodynamically significant stenosis.    Assessment  JUSTISS GERBINO is an 83 y.o. female with a PMHx of CAD, HTN, HLD, peripheral vascular disease, paroxysmal afib not on AC for at least 5 years due to 2/2 GI bleed, renal artery stenosis, and migraines with visual aura who presented with typical migraine headache symptoms along with nausea which was also typical of her migraine.  However, in addition, she reported having vertigo on Friday and vision changes.  Her neuroexam is notable for left hemianopsia, no meningismus, intact cognition and executive function. 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 Keppra was later added and Depakote will need to be uptitrated due to continued intermittent seizures on LTM EEG.  - MRI of the brain demonstrates multiple abnormalities including a right PCA stroke along with leptomeningeal/pial-arachnoid enhancement in the posterior left frontal lobe and high left parietal lobe corresponding to area of abnormality on CT. In addition, there is diffuse dural enhancement on the right.  The right occipital lobe infarction may be due to an infiltrative process as there is subtle sulcal enhancement at this location as well.  -  MRI of the cervical, thoracic and lumbar spine demonstrates subtle leptomeningeal enhancement about the cervical cord and thoracic spinal cord most prominently at T11-T12 with leptomeningeal enhancement of the distal cord as well - Ideally CSF studies are needed, but LPs x 2 have not resulted in CSF to analyze. Unable to access thecal sac on 1st attempt. On second attempt, the radiologist got access to the thecal sac 3 times at 3 separate levels and no CSF came out. This likely means that the CSF pressure is too low or the CSF itself is too viscous to drain. Another LP attempt would be futile.  - CT Chest Abdomen and Pelvis shows no evidence for primary malignancy. - May need to engage neurosurgery for a biopsy. Optimal site for biopsy would be the left frontal-parietal subarachnoid enhancing abnormality.  - Labs: - Quantiferon-TB  gold Plus is negative - RPR negative - EKG: Atrial fibrillation with rapid ventricular response; Minimal voltage criteria for LVH, may be normal variant ( Cornell product ) Inferior infarct , age undetermined; ST & T wave abnormality, consider lateral ischemia - EEGs: - Spot EEG: This study showed 2 focal seizures without clinical signs arising from right occipital region at 0825 and 0833 lasting for about 1 minute 10 seconds each. Additionally there was evidence of cortical dysfunction in right occipital region likely secondary to underlying structural abnormality. - LTM read 6/10 demonstrated focal seizures in right oiccipital region. Depakote 500 mg BID was started at that time.  - LTM read 6/11 shows additional 3-4 seizures per hour. Increased Depakote on 6/11 to 750 mg BID - LTM EEG report for today: Focal seizure, right occipital region; background asymmetry, right<left; intermittent slow, right>left occipital region. This study showed focal seizures without clinical signs arising from the right occipital region. Seizures were noted on 06/10/2024 at 0127 and 0553, lasting  for about 1 and half minute each. Additionally there was cortical dysfunction in right > left occipital region likely secondary to underlying structural abnormality. - Impression: - Differential for her lesions on MRI, with associated new onset seizures, is broad and includes CNS lymphoma, vasculitis, leptomeningeal carcinomatosis secondary to metastatic solid tumor, CNS TB (unlikely but possible) and neurosarcoidosis. Low suspicion for bacterial meningitis (other than possible TB), given that she is not encephalopathic. A fungal meningitis is also possible but unlikely given the lack of meningismus with intact mentation.  - DDx for her right occipital lobe stroke includes atrial fibrillation (see EKG result above) versus an infiltrative process resulting in a vasculopathy at this location (see MRI discussion above).    Recommendations  - ANA with IFA, dsDNA, Lyme PCR, autoimmune encephalopathy panel, serum ACE   - Continue Depakote at 750mg  BID. Checking valproic acid level and ammonia levels Friday morning - Scheduled Keppra was increased to 1500 mg BID yesterday given recurrent seizures on LTM EEG - Will monitor for improvement on LTM EEG and hold off on additional AEDs for now. If LTM tomorrow shows continued seizures, may start clonazepam or Onfi.  - Continue LTM EEG   ______________________________________________________________________   Hope Ly, Massiah Minjares, MD [facility]

## 2024-06-10 NOTE — Consult Note (Signed)
 Physical Medicine and Rehabilitation Consult Reason for Consult: Evaluate appropriateness for Inpatient Rehab Referring Physician: Dr. Butch Cashing    HPI: Gabriela Moyer is a 83 y.o. female with PMHx of  has a past medical history of Carotid arterial disease (HCC), Diastolic dysfunction, History of stress test, Hyperlipidemia, Hypertension, PAF (paroxysmal atrial fibrillation) (HCC), Renal artery stenosis (HCC), and Subclavian arterial stenosis (HCC). . They were admitted to [facility] on 06/06/2024 for headache intractable for 4 days with vertigo and a right visual field cut.  On ED evaluation, CT head showed amorphous high density areas in the left frontal cortex and sulci, a 6 mm hyperdense area in the right lateral ventricle, along with a low-density area in the right occipital lobe.  Follow-up MRI showed leptomeningeal enhancement in the right occipital lobe concerning for meningitis/encephalitis, however neurology was not concerned given patient's clinical symptoms. 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  EEG showed status epilepticus, the patient was started on Keppra and Depakote.  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  CT chest, abdomen, pelvis ordered to evaluate for malignancy, resulted 6/11 with 6 mm right upper lobe pulmonary nodule, along with diverticulosis and a 10 mm left renal stone..  This a.m., had repeat EEG showing right occipital focal seizures ongoing.  Hospitalization has been complicated by hypokalemia, hypothyroidism, ongoing headaches with nausea, paroxysmal A-fib, QT prolongation, and hypertension.  PM&R was consulted to evaluate appropriateness for IPR admission.    Per chart review, patient  lives in a 1 level home with one-step to enter, alone.  She reports friends and family who can provide 24/7 assistance if needed.  At baseline, she is independent for mobility without an assistive device, and independent of ADLs.  She is a 62-year-old goldendoodle that she takes care for independently.  She has no history of falls in the last 6 months.  Currently, patient is min assist for transfers and contact-guard to min assist with ambulation with a rolling walker due to significant left lean and visual field cut/hemineglect reducing safety awareness.  She also requires max cues for scanning strategies for items on the left during ADL trials.  She is min assist for feeding, toilet transfers.  She was evaluated by speech for cognitive deficits, was determined to be within normal limits for cognition, speech, and language.  Review of Systems  Constitutional:  Negative for chills and fever.       Positive for purposeful weight loss of 15 pounds over the last few months  HENT:  Negative for hearing loss.   Eyes:        L vision loss  Respiratory:  Negative for cough and shortness of breath.   Cardiovascular:  Negative for chest pain and palpitations.  Gastrointestinal:  Negative for constipation, diarrhea, nausea and vomiting.  Genitourinary:  Negative for dysuria.  Musculoskeletal:  Negative for back pain, joint pain and neck pain.  Neurological:  Positive for dizziness, seizures and headaches. Negative for tingling, sensory change, speech change and focal weakness.  Psychiatric/Behavioral:  Negative for memory loss. The patient does not have insomnia.    Past Medical History:  Diagnosis Date   Carotid arterial disease (HCC)    a. 08/2016 Carotid U/S: 1-39% bilat ICA stenosis, >50 R ECA stenosis.   Diastolic  dysfunction    a. 11/2016 Echo: EF 65-70%, Gr1 DD.   History of stress test    a. 12/2016 Lexiscan  MV: EF 73%, no ischemia/infarct.   Hyperlipidemia    Hypertension    PAF (paroxysmal  atrial fibrillation) (HCC)    a. 11/2016 AF RVR in ED-->converted on IV dilt-->CHA2DS2VASc = 4-->Xarelto .   Renal artery stenosis (HCC)    a. 06/2009 s/p R Renal Artery PTA/stenting; b. 08/2016 Renal Duplex: nl LRA, >60% RRA stenosis- f/u 1 yr.   Subclavian arterial stenosis (HCC)    a. 08/2014 Upper Ext Duplex: R distal Yantis & Ax - 70-99% diam reduction, L distal Tony & Ax - 50-69%--stable.   Past Surgical History:  Procedure Laterality Date   CARDIAC CATHETERIZATION  11/03/2007   atherosclerotic right renal artery (Dr. Jammie Mccune)   Carotid Doppler  12/2010   R & L ICA with small amount fibrous plaque; R distal subclav. 70-99% diameter reduction; L mid subclav 50-69% diameter reduction   Lower Extremity Arterial Doppler  03/2008   right ABI - mild arterial insuff at rest; R CIA with less than 50% diameter reduction, R & L SFA with less than 50% diameter reduction   NM MYOCAR PERF WALL MOTION  09/2009   bruce myoview  - normal perfusion, EF 915, low risk scan   RENAL ANGIOPLASTY  11/07/2009   5x96mm Cordis Genesis Aviator stent to ostium of right renal artery (Dr. Aleda Ammon)   Renal Artery Doppler  04/2012   celiac artery with >50% diameter reduction; R renal artery stent w/mildly elevated velocities (1-59% diameter reduction)   TRANSTHORACIC ECHOCARDIOGRAM  08/28/2009   EF=>55%; trace TR; mild AV regurg, mild pulm valve regurg   Upper Extremity Arterial Doppler  04/2012   right brachial pressure , left ; R distal subclavian & axillary arteries 70-99% diameter reduction; L distal subclavian/axillary & brachial arteries (50-69% diameter reduction)   Family History  Problem Relation Age of Onset   Heart disease Mother    Stroke Mother    Cancer Father    Heart Problems Maternal Grandmother    Heart Problems Maternal Grandfather    Cancer Brother    Social History:  reports that she has never smoked. She has been exposed to tobacco smoke. She has never used smokeless tobacco. She reports  that she does not drink alcohol  and does not use drugs. Allergies:  Allergies  Allergen Reactions   Ezetimibe      Other Reaction(s): muscle aches, arm cramps   Codeine Nausea And Vomiting    Abdominal pain   Contrast Media [Iodinated Contrast Media] Nausea And Vomiting    Abdominal pain Caused low blood pressure   Morphine Nausea And Vomiting    Abdominal pain   Medications Prior to Admission  Medication Sig Dispense Refill   amLODipine  (NORVASC ) 5 MG tablet TAKE 1 TABLET BY MOUTH DAILY 90 tablet 1   clopidogrel  (PLAVIX ) 75 MG tablet TAKE 1 TABLET BY MOUTH DAILY 90 tablet 0   hydrochlorothiazide  (HYDRODIURIL ) 25 MG tablet TAKE 1 TABLET(25 MG) BY MOUTH DAILY 90 tablet 1   levothyroxine  (SYNTHROID , LEVOTHROID) 25 MCG tablet Take 1 tablet by mouth daily.  0   metoprolol  succinate (TOPROL -XL) 100 MG 24 hr tablet TAKE 1 TABLET(100 MG) BY MOUTH DAILY (Patient taking differently: Take 50 mg by mouth in the morning and at bedtime.) 90 tablet 3   rosuvastatin (CRESTOR) 5 MG tablet Take 5 mg by mouth daily as needed.     ezetimibe  (ZETIA ) 10  MG tablet TAKE 1 TABLET(10 MG) BY MOUTH DAILY (Patient not taking: Reported on 06/06/2024) 90 tablet 3   metoprolol  tartrate (LOPRESSOR ) 50 MG tablet Take 2 hours prior to procedure 1 tablet 0   pravastatin  (PRAVACHOL ) 40 MG tablet Take 1 tablet (40 mg total) by mouth every evening. (Patient not taking: Reported on 06/06/2024) 90 tablet 3    Home: Home Living Family/patient expects to be discharged to:: Private residence Living Arrangements: Alone Available Help at Discharge:  (reports she has family/friends that could provide as much as 24 hr assist if needed) Type of Home: House Home Access: Stairs to enter [employer] of Steps: 1 Entrance Stairs-Rails: None Home Layout: One level Bathroom Shower/Tub: Engineer, manufacturing systems: Handicapped height Home Equipment: Tub bench  Lives With: Alone  Functional History: Prior Function Prior  Level of Function : Driving Mobility Comments: Independent without AD, driving, denies falls in the past 6 months. ADLs Comments: Bathes & dresses herself, grocery shops. Has a 84 y/o Goldendoodle named Georgia . Functional Status:  Mobility: Bed Mobility Overal bed mobility: Modified Independent Bed Mobility: Supine to Sit Supine to sit: Modified independent (Device/Increase time), HOB elevated Sit to supine: Modified independent (Device/Increase time), HOB elevated General bed mobility comments: OOB on arrival, returned to bed Transfers Overall transfer level: Needs assistance Equipment used: Rolling walker (2 wheels) Transfers: Sit to/from Stand, Bed to chair/wheelchair/BSC Sit to Stand: Contact guard assist Bed to/from chair/wheelchair/BSC transfer type:: Step pivot Step pivot transfers: Min assist General transfer comment: CGA, requires assist during ambulation/transfers to avoid obstacles in L environment Ambulation/Gait Ambulation/Gait assistance: Min assist, Contact guard assist Gait Distance (Feet): 150 Feet Assistive device: Rolling walker (2 wheels) Gait Pattern/deviations: Decreased step length - right, Festinating, Decreased stride length, Drifts right/left General Gait Details: Pt with downward gaze and drifting L with heavy cues to avoid obstacles on the L and navigate AD away from obstacles to decrease risk for falls. Pt is demonstrating significant L neglect. Gait velocity: decreased Gait velocity interpretation: <1.8 ft/sec, indicate of risk for recurrent falls    ADL: ADL Overall ADL's : Needs assistance/impaired Eating/Feeding: Minimal assistance Eating/Feeding Details (indicate cue type and reason): minA to locate items on the L side of tray table Grooming: Wash/dry hands, Wash/dry face, Set up, Sitting Upper Body Bathing: Set up, Sitting Lower Body Bathing: Minimal assistance, Sit to/from stand Upper Body Dressing : Supervision/safety, Sitting Lower Body  Dressing: Minimal assistance, Sit to/from stand Lower Body Dressing Details (indicate cue type and reason): For donning her pants Toilet Transfer: Minimal assistance Toilet Transfer Details (indicate cue type and reason): min A to correct bumping into things on her L side Toileting- Clothing Manipulation and Hygiene: Minimal assistance, Sit to/from stand Functional mobility during ADLs: Minimal assistance, Rolling walker (2 wheels) General ADL Comments: pt requires assist to manage RW to avoid bumping into things in the L environment  Cognition: Cognition Overall Cognitive Status: Within Functional Limits for tasks assessed Arousal/Alertness: Awake/alert Orientation Level: Oriented X4 Cognition Arousal: Alert Behavior During Therapy: WFL for tasks assessed/performed Overall Cognitive Status: Within Functional Limits for tasks assessed  Blood pressure (!) 145/78, pulse 72, temperature 97.6 F (36.4 C), temperature source Oral, resp. rate 18, height 5' 2 (1.575 m), weight 74 kg, SpO2 98%.  Physical Exam Constitutional: No apparent distress. Appropriate appearance for age.  Sitting upright in bedside chair, under EEG monitoring HENT: No JVD. Neck Supple. Trachea midline. Atraumatic, normocephalic. Eyes: PERRLA. EOMI. Total left visual field In both binocular and monocular  vision b/l. Cardiovascular: RRR, no murmurs/rub/gallops. No Edema. Peripheral pulses 2+  Respiratory: CTAB. No rales, rhonchi, or wheezing. On RA.  Abdomen: + bowel sounds, normoactive. No distention or tenderness.  Skin: No apparent lesions.  Peripheral IV C/D/I MSK:      No apparent deformity.  Moving all 4 limbs full active range of motion.       Neurologic exam:  Cognition: AAO to person, place, time and event.  + ?Hemineglect - mild, confuses L/R and with no awareness of vision deficit, does use LUE and LLE with minimal stimulation  Language: Fluent, No substitutions or neoglisms. No dysarthria. Names 3/3  objects correctly.  Memory: Recalls 3/3 objects at 5 minutes. No apparent deficits  Insight: Good insight into current condition.  Mood: Pleasant affect, appropriate mood.  Sensation: To light touch intact in BL UEs and LEs  Reflexes: 2+ in BL UE and LEs. Negative Hoffman's and babinski signs bilaterally.  CN: 2-12 grossly intact.  Coordination: Mild LUE ataxia, c/b visual deficit.  Spasticity: MAS 0 in all extremities.  Strength: 5/5 in all 4 extremities    Results for orders placed or performed during the hospital encounter of 06/06/24 (from the past 24 hours)  T3, free     Status: None   Collection Time: 06/09/24 12:30 PM  Result Value Ref Range   T3, Free 2.1 2.0 - 4.4 pg/mL   CT CHEST ABDOMEN PELVIS W CONTRAST Result Date: 06/09/2024 CLINICAL DATA:  Known leptomeningeal disease, evaluate for metastatic disease EXAM: CT CHEST, ABDOMEN, AND PELVIS WITH CONTRAST TECHNIQUE: Multidetector CT imaging of the chest, abdomen and pelvis was performed following the standard protocol during bolus administration of intravenous contrast. RADIATION DOSE REDUCTION: This exam was performed according to the departmental dose-optimization program which includes automated exposure control, adjustment of the mA and/or kV according to patient size and/or use of iterative reconstruction technique. CONTRAST:  75mL OMNIPAQUE  IOHEXOL  350 MG/ML SOLN COMPARISON:  None Available. FINDINGS: CT CHEST FINDINGS Cardiovascular: Atherosclerotic calcifications of the thoracic aorta are noted. No aneurysmal dilatation or dissection is seen. No cardiac enlargement is noted. Mild coronary calcifications are seen. The pulmonary artery as visualized shows no filling defect to suggest pulmonary embolism. Timing was not performed for embolus evaluation however. Mediastinum/Nodes: Thoracic inlet is within normal limits. No hilar or mediastinal adenopathy is noted. The esophagus as visualized is within normal limits. Lungs/Pleura: Lungs  are well aerated bilaterally. A 1-2 mm right upper lobe nodule is noted laterally on image number 76 of series 6. No follow-up is recommended. No other nodules are seen. Minimal basilar atelectasis is seen. Musculoskeletal: No acute rib abnormality is noted. Degenerative changes of the thoracic spine are seen. CT ABDOMEN PELVIS FINDINGS Hepatobiliary: Gallbladder has been surgically removed. Mild biliary ductal dilatation is noted due to the post cholecystectomy state. Small cyst is noted within the left lobe of liver. Pancreas: Unremarkable. No pancreatic ductal dilatation or surrounding inflammatory changes. Spleen: Normal in size without focal abnormality. Adrenals/Urinary Tract: Adrenal glands are within normal limits. Kidneys demonstrate a normal enhancement pattern bilaterally. Multiple small simple appearing cysts are noted. No follow-up is recommended. A stone is noted in the upper pole of the left kidney measuring up to 10 mm with associated scarring identified. No ureteral stones are seen. The bladder is within normal limits. Stomach/Bowel: Scattered diverticular change of the colon is noted without evidence of diverticulitis. No obstructive or inflammatory changes of the colon are seen. The appendix is not well visualized. No inflammatory changes  to suggest appendicitis are noted. Small bowel and stomach are unremarkable. Vascular/Lymphatic: Aortic atherosclerosis. No enlarged abdominal or pelvic lymph nodes. Reproductive: Status post hysterectomy. No adnexal masses. Other: Minimal free fluid is noted within the pelvis of uncertain significance. Musculoskeletal: No acute or significant osseous findings. IMPRESSION: CT of the chest: Mild bibasilar atelectasis. Less than 6 mm right solid pulmonary nodule within the upper lobe. Per Fleischner Society Guidelines, if patient is low risk for malignancy, no routine follow-up imaging is recommended. If patient is high risk for malignancy, a non-contrast Chest CT  at 12 months is optional. If performed and the nodule is stable at 12 months, no further follow-up is recommended. These guidelines do not apply to immunocompromised patients and patients with cancer. Follow up in patients with significant comorbidities as clinically warranted. For lung cancer screening, adhere to Lung-RADS guidelines. Reference: Radiology. 2017; 284(1):228-43. CT of the abdomen and pelvis: Diverticulosis without diverticulitis. Left renal stone in the upper pole measuring 10 mm with associated scarring. No other focal abnormality is noted. Electronically Signed   By: Violeta Grey M.D.   On: 06/09/2024 23:16   DG FL GUIDED LUMBAR PUNCTURE Result Date: 06/08/2024 CLINICAL DATA:  Provided history: Seizures. Request received for fluoroscopically-guided lumbar puncture. EXAM: LUMBAR PUNCTURE UNDER FLUOROSCOPY PROCEDURE: The patient was positioned prone on the fluoroscopy table. An appropriate skin entry site was determined under fluoroscopy and marked. The operator donned sterile gloves and a mask. The lower back was prepped and draped in the usual sterile fashion. Local anesthesia was provided with 1% lidocaine . Under intermittent fluoroscopy, lumbar puncture was performed at the L2-L3, L3-L4 and L5-S1 levels using a 20 gauge spinal needle. However, there was no CSF return at these levels. An additional lumbar puncture attempt was offered to the patient, but the patient elected to terminate the procedure. The inner stylet was replaced within the needle and the needle was removed in its entirety. Dressings were applied to the skin entry sites. No immediate post-procedure complication was apparent. The procedure was performed by Lambert Pillion, PA-C, supervised by Dr. Bascom Lily. FLUOROSCOPY: Radiation Exposure Index (as provided by the fluoroscopic device): 28.8 mGy Kerma IMPRESSION: 1. Fluoroscopically-guided lumbar punctures performed at the L2-L3, L3-L4 and L5-S1 levels without CSF return. 2. No  immediate post-procedure complication. Electronically Signed   By: Bascom Lily D.O.   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 Potentially Co-Morbidities requiring supervision/potential complications: Ongoing seizures on EEG, cancer workup for leptomeningeal enhancement of unknown origin, hypokalemia, hypothyroidism, ongoing headaches with nausea, paroxysmal A-fib, QT prolongation, and hypertension Due to safety, skin/wound care, disease management, medication administration, and patient education, does the patient require 24 hr/day rehab nursing? Yes Does the patient require coordinated care of a physician, rehab nurse, therapy disciplines of PT, OT to address physical and functional deficits in the context of the above medical diagnosis(es)? Yes Addressing deficits in the following areas: balance, locomotion, transferring, bathing, dressing, feeding, grooming, and toileting Can the patient actively participate in an intensive therapy program of at least 3 hrs of therapy per day at least 5 days per week? Yes The potential for patient to make measurable gains while on inpatient rehab is excellent Anticipated functional outcomes upon discharge from inpatient rehab are supervision  with PT, supervision  with OT. Estimated rehab length of stay to reach the above functional goals is: 5-7 days Anticipated discharge destination: Home Overall Rehab/Functional Prognosis: excellent  POST ACUTE RECOMMENDATIONS: This patient's condition is appropriate for continued rehabilitative care in the following setting: CIR Patient has agreed to participate in recommended program. Yes Note that insurance prior authorization may be required for reimbursement for recommended  care.  Comment: Gabriela Moyer is an exceptionally healthy 83 year old female who presents with left visual field cut and vertigo from right PCA stroke, along with leptomeningeal disease of unknown origin, undergoing workup at this time.  She is independent at baseline and lives alone, although she has family members who live within a mile of her and stay with her for weeks at a time, who can provide 24/7 supervision and assistance at discharge.  She remains limited by ongoing seizures and poor safety awareness due to left visual deficits, and could benefit from skilled PT/OT therapies in an inpatient environment.   I have personally performed a face to face diagnostic evaluation of this patient. Additionally, I have examined the patient's medical record including any pertinent labs and radiographic images. If the physician assistant has documented in this note, I have reviewed and edited or otherwise concur with the physician assistant's documentation.  Thanks,  Bea Lime, DO 06/10/2024

## 2024-06-10 NOTE — Progress Notes (Signed)
 Physical Therapy Treatment Patient Details Name: Gabriela Moyer MRN: [id number] DOB: 1941-03-31 Today's Date: 06/10/2024   History of Present Illness Pt is an 83 y/o F admitted on 06/06/24 after presenting with c/o HA x 4 days, as well as vertigo & vision changes.  MRI brain w + w /o C which demonstrated leptomeningeal/pial-arachnoid enhancement in the posterior left frontal lobe and high left parietal lobe corresponding to area of abnormality on CT. 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 PMH: chronic migraine, HTN, HLD, extensive PAD of subclavian, axillary, brachial & renal artery s/p R renal artery stent 2010, hypothyroidism, paroxysmal a-fib.    PT Comments  Pt is progressing towards goals. Currently pt is supervision for sit to stand and transfers. Mod I for bed mobility today. Pt did not progress gait today due to continuous video monitored EEG\. Sister was present and supportive. Due to pt current functional status, home set up and available assistance at home recommending skilled physical therapy services > 3 hours/day in order to address strength, balance and functional mobility to decrease risk for falls, injury, immobility, skin break down and re-hospitalization.      If plan is discharge home, recommend the following: A little help with walking and/or transfers;Assistance with cooking/housework;Assist for transportation;Help with stairs or ramp for entrance     Equipment Recommendations  Rolling walker (2 wheels);BSC/3in1       Precautions / Restrictions Precautions Precautions: Fall Precaution/Restrictions Comments: left visual field cut, orthostatic Restrictions Weight Bearing Restrictions Per Provider Order: No     Mobility  Bed Mobility Overal bed mobility: Modified Independent Bed Mobility: Supine to Sit     Supine to sit: Modified independent (Device/Increase time), HOB elevated     General bed mobility comments: Pt left in recliner at  end of session    Transfers Overall transfer level: Needs assistance Equipment used: None Transfers: Sit to/from Stand, Bed to chair/wheelchair/BSC Sit to Stand: Supervision   Step pivot transfers: Supervision       General transfer comment: supervision for sit to stand and step pivot transfers this session with assist with lines. No LOB    Ambulation/Gait   Pre-gait activities: pt took steady steps from EOB to recliner without UE support General Gait Details: deferred this date due to video monitored EEG and pt wanted to eat lunch    Modified Rankin (Stroke Patients Only) Modified Rankin (Stroke Patients Only) Pre-Morbid Rankin Score: No symptoms Modified Rankin: Moderate disability     Balance Overall balance assessment: Needs assistance Sitting-balance support: Feet supported Sitting balance-Leahy Scale: Good     Standing balance support: Single extremity supported, During functional activity, Bilateral upper extremity supported Standing balance-Leahy Scale: Fair      Hotel manager: No apparent difficulties  Cognition Arousal: Alert Behavior During Therapy: WFL for tasks assessed/performed   PT - Cognitive impairments: No apparent impairments       Following commands: Intact      Cueing Cueing Techniques: Verbal cues     General Comments General comments (skin integrity, edema, etc.): Sister present during session.      Pertinent Vitals/Pain Pain Assessment Pain Assessment: No/denies pain     PT Goals (current goals can now be found in the care plan section) Acute Rehab PT Goals Patient Stated Goal: get better PT Goal Formulation: With patient Time For Goal Achievement: 06/21/24 Potential to Achieve Goals: Good Progress towards PT goals: Progressing toward goals    Frequency    Min 3X/week  PT Plan  Continue with current POC        AM-PAC PT 6 Clicks Mobility   Outcome Measure  Help needed  turning from your back to your side while in a flat bed without using bedrails?: None Help needed moving from lying on your back to sitting on the side of a flat bed without using bedrails?: None Help needed moving to and from a bed to a chair (including a wheelchair)?: A Little Help needed standing up from a chair using your arms (e.g., wheelchair or bedside chair)?: A Little Help needed to walk in hospital room?: A Little Help needed climbing 3-5 steps with a railing? : A Little 6 Click Score: 20    End of Session Equipment Utilized During Treatment: Gait belt Activity Tolerance: Patient tolerated treatment well Patient left: in chair;with call bell/phone within reach;with chair alarm set;with family/visitor present Nurse Communication: Mobility status PT Visit Diagnosis: Unsteadiness on feet (R26.81);Difficulty in walking, not elsewhere classified (R26.2)     Time: 0981-1914 PT Time Calculation (min) (ACUTE ONLY): 10 min  Charges:    $Therapeutic Activity: 8-22 mins PT General Charges $$ ACUTE PT VISIT: 1 Visit                    Sloan Duncans, DPT, CLT  Acute Rehabilitation Services Office: [phone] (Secure chat preferred)    Jenice Mitts 06/10/2024, 12:25 PM

## 2024-06-10 NOTE — Progress Notes (Signed)
 Progress Note   Patient: Gabriela Moyer ZOX:[id number] DOB: [birth year] DOA: 06/06/2024     4 DOS: the patient was seen and examined on 06/10/2024   Brief hospital course: Gabriela Moyer is a 83 y.o. female with medical history significant of chronic migraine, essential hypertension, hyperlipidemia, extensive  PAD of subclavian, axillary, brachial and renal artery status post right renal artery stent 2010, hypothyroidism and paroxysmal afibrillation not on Xarelto  due to history of GI bleed recent emergency department complaining of headache for 4 days which is right-sided with associated vertigo, some vision change with the onset of the headache.   CT head: Amorphous high-density areas noted in the left frontal region along the cortex and sulci. It is difficult to determine if this is subarachnoid hemorrhage or within the cortex.   6 mm small hyperdense area adjacent to the right lateral ventricle concerning for small brain lesion.   Low-density area in the right occipital lobe could reflect infarct or edema related to underlying lesion.   MRI brain showed: Leptomeningeal/pial-arachnoid enhancement in the posterior left frontal lobe and high left parietal lobe corresponding to area of abnormality on CT. No definite underlying parenchymal enhancement or edema. 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 Findings most concerning for meningitis and possible focus of encephalitis in the right occipital lobe. Recommend lumbar puncture for further evaluation.   Differential could also include leptomeningeal carcinomatosis. Complex migraine and associated vasospasm could potentially lead to infarct although the degree of enhancement especially in the frontoparietal lobes is atypical.   Diffuse dural enhancement noted.   Nodule along the surface of the right lateral ventricle with signal intensity similar to cortical  gray matter without associated enhancement, suggestive of gray matter heterotopia.   Unremarkable MRI of the orbits.  No findings of optic neuritis. Admitted for further management and neurological evaluation.  During hospital stay, EEG showed status epileptius, started Keprra, depakote as per neurology recommendations. MRI spine - Suspected subtle leptomeningeal enhancement about the distal cord. Finding is nonspecific, and could be either infectious or inflammatory in nature. Possible neoplastic process with leptomeningeal carcinomatosis could also be considered. 5 attempts of LP failed.   Assessment and Plan: Acute CVA Abnormal MRI Brain Focal seizures  Presented with migraine headache, nausea, vertigo, right visual field cut. Reviewed imaging studies concerning for leptomeningeal process rule out infectious etiology, ?malignancy. EEG showed focal seizures arising from right occipital lobe. Neurology recommended Keppra with 1500bid, Depakote 750 q12.  5 attempts to obtain LP unsuccessful. CT chest/ abdomen/ pelvis showed 6mm  Neurosurgery Dr.Jenkins advised to repeat attempt of spinal tap, as brain biopsy not appropriate in this case. Hold Plavix  as she will get LP for further evaluation of her MRI findings. Ddx possible CNS lymphoma, vasculitis, leptomeningeal carcinomatosis secondary to metastatic solid tumor, CNS TB. Follow neurology recommendations. PT/ OT advised CIR, awaiting insurance auth.  Hypokalemia: K improved. Oral potassium supplements bid ordered. Continue to monitor and replete as needed.  Hypothyroidism: TSH high, t3 pending. T4 1.54 Home dose synthroid  resumed.  Acute migraine headache with associated nausea Continue Fioricet as needed.  Paroxysmal Afib Had RVR last night. Resumed Toprol  xl 25 daily. Not on anticoagulation due to gi bleed. Continue telemetry monitoring.  QT prolongation: Avoid QT prolonging drugs. Continue Tigan as needed for  nausea.  Hypotension. Orthostatic vitals positive. Resumed Toprol  xl 25 daily as she has Afib last night. Hold toprol  as she has bradycardia. Monitor her vitals closely.  Out of bed to chair. Incentive spirometry. Nursing supportive care. Fall, aspiration precautions. Diet:  Diet Orders (From admission, onward)     Start     Ordered   06/06/24 2138  Diet Heart Fluid consistency: Thin  Diet effective now       Question:  Fluid consistency:  Answer:  Thin   06/06/24 2137           DVT prophylaxis: Place and maintain sequential compression device Start: 06/07/24 1849 Place TED hose Start: 06/07/24 1849  Level of care: Telemetry Medical   Code Status: Full Code  Subjective: Patient is seen and examined today morning. She feels dizziness improved. Eating fair. Worked with PT. Sister at bedside.  Physical Exam: Vitals:   06/09/24 1923 06/09/24 2331 06/10/24 0358 06/10/24 0828  BP: 125/79 103/79 115/69 (!) 145/78  Pulse: 66 65 66 72  Resp: 18 18 18 18   Temp: 97.9 F (36.6 C) 97.6 F (36.4 C) 97.8 F (36.6 C) 97.6 F (36.4 C)  TempSrc: Oral   Oral  SpO2: 95% 100% 97% 98%  Weight:      Height:        General - Elderly ill Caucasian female, no acute distress. HEENT - PERRLA, EOMI, atraumatic head, non tender sinuses. Lung - Clear, basal rales, rhonchi, no wheezes. Heart - S1, S2 heard, no murmurs, rubs, trace pedal edema. Abdomen - Soft, non tender, bowel sounds good Neuro - Alert, awake and oriented, non focal exam. Skin - Warm and dry.  Data Reviewed:      Latest Ref Rng & Units 06/07/2024    3:47 AM 06/06/2024    3:03 PM 06/06/2024    2:58 PM  CBC  WBC 4.0 - 10.5 K/uL 9.5   11.2   Hemoglobin 12.0 - 15.0 g/dL 16.1  09.6  04.5   Hematocrit 36.0 - 46.0 % 39.8  47.0  46.0   Platelets 150 - 400 K/uL 268   321       Latest Ref Rng & Units 06/10/2024   11:05 AM 06/09/2024    4:43 AM 06/07/2024    3:47 AM  BMP  Glucose 70 - 99 mg/dL  409  96   BUN 8 - 23 mg/dL   17  19   Creatinine 8.11 - 1.00 mg/dL  9.14  7.82   Sodium 956 - 145 mmol/L  140  137   Potassium 3.5 - 5.1 mmol/L 3.9  3.0  3.3   Chloride 98 - 111 mmol/L  106  101   CO2 22 - 32 mmol/L  25  23   Calcium  8.9 - 10.3 mg/dL  8.3  9.2    CT CHEST ABDOMEN PELVIS W CONTRAST Result Date: 06/09/2024 CLINICAL DATA:  Known leptomeningeal disease, evaluate for metastatic disease EXAM: CT CHEST, ABDOMEN, AND PELVIS WITH CONTRAST TECHNIQUE: Multidetector CT imaging of the chest, abdomen and pelvis was performed following the standard protocol during bolus administration of intravenous contrast. RADIATION DOSE REDUCTION: This exam was performed according to the departmental dose-optimization program which includes automated exposure control, adjustment of the mA and/or kV according to patient size and/or use of iterative reconstruction technique. CONTRAST:  75mL OMNIPAQUE  IOHEXOL  350 MG/ML SOLN COMPARISON:  None Available. FINDINGS: CT CHEST FINDINGS Cardiovascular: Atherosclerotic calcifications of the thoracic aorta are noted. No aneurysmal dilatation or dissection is seen. No cardiac enlargement is noted. Mild coronary calcifications are seen. The pulmonary artery as visualized shows no filling defect to suggest pulmonary embolism. Timing was not  performed for embolus evaluation however. Mediastinum/Nodes: Thoracic inlet is within normal limits. No hilar or mediastinal adenopathy is noted. The esophagus as visualized is within normal limits. Lungs/Pleura: Lungs are well aerated bilaterally. A 1-2 mm right upper lobe nodule is noted laterally on image number 76 of series 6. No follow-up is recommended. No other nodules are seen. Minimal basilar atelectasis is seen. Musculoskeletal: No acute rib abnormality is noted. Degenerative changes of the thoracic spine are seen. CT ABDOMEN PELVIS FINDINGS Hepatobiliary: Gallbladder has been surgically removed. Mild biliary ductal dilatation is noted due to the post  cholecystectomy state. Small cyst is noted within the left lobe of liver. Pancreas: Unremarkable. No pancreatic ductal dilatation or surrounding inflammatory changes. Spleen: Normal in size without focal abnormality. Adrenals/Urinary Tract: Adrenal glands are within normal limits. Kidneys demonstrate a normal enhancement pattern bilaterally. Multiple small simple appearing cysts are noted. No follow-up is recommended. A stone is noted in the upper pole of the left kidney measuring up to 10 mm with associated scarring identified. No ureteral stones are seen. The bladder is within normal limits. Stomach/Bowel: Scattered diverticular change of the colon is noted without evidence of diverticulitis. No obstructive or inflammatory changes of the colon are seen. The appendix is not well visualized. No inflammatory changes to suggest appendicitis are noted. Small bowel and stomach are unremarkable. Vascular/Lymphatic: Aortic atherosclerosis. No enlarged abdominal or pelvic lymph nodes. Reproductive: Status post hysterectomy. No adnexal masses. Other: Minimal free fluid is noted within the pelvis of uncertain significance. Musculoskeletal: No acute or significant osseous findings. IMPRESSION: CT of the chest: Mild bibasilar atelectasis. Less than 6 mm right solid pulmonary nodule within the upper lobe. Per Fleischner Society Guidelines, if patient is low risk for malignancy, no routine follow-up imaging is recommended. If patient is high risk for malignancy, a non-contrast Chest CT at 12 months is optional. If performed and the nodule is stable at 12 months, no further follow-up is recommended. These guidelines do not apply to immunocompromised patients and patients with cancer. Follow up in patients with significant comorbidities as clinically warranted. For lung cancer screening, adhere to Lung-RADS guidelines. Reference: Radiology. 2017; 284(1):228-43. CT of the abdomen and pelvis: Diverticulosis without diverticulitis.  Left renal stone in the upper pole measuring 10 mm with associated scarring. No other focal abnormality is noted. Electronically Signed   By: Violeta Grey M.D.   On: 06/09/2024 23:16    Family Communication: Discussed with patient, sister at bedside. They understand and agree. All questions answered.  Disposition: Status is: Inpatient Remains inpatient appropriate because: neurology work up, orthostatic. Pending LP  Planned Discharge Destination: Rehab     Time spent: 38 minutes  Author: Aisha Hove, MD 06/10/2024 4:10 PM Secure chat 7am to 7pm For on call review [email or web address].

## 2024-06-10 NOTE — Procedures (Addendum)
 Patient Name: Gabriela Moyer  MRN: [id number]  Epilepsy Attending: Arleene Lack  Referring Physician/Provider: Arleene Lack, MD  Duration: 06/09/2024 1515 to 06/10/2024 1515   Patient history: 83 y.o. female with visual disturbance. EEG to evaluate for seizure.    Level of alertness: Awake, asleep   AEDs during EEG study: LEV, VPA   Technical aspects: This EEG study was done with scalp electrodes positioned according to the 10-20 International system of electrode placement. Electrical activity was reviewed with band pass filter of 1-70Hz , sensitivity of 7 uV/mm, display speed of 2mm/sec with a 60Hz  notched filter applied as appropriate. EEG data were recorded continuously and digitally stored.  Video monitoring was available and reviewed as appropriate.   Description: The posterior dominant rhythm consists of 8-9 Hz activity of moderate voltage (25-35 uV) seen predominantly in posterior head regions, asymmetric ( right<left) and reactive to eye opening and eye closing.  Sleep was characterized by vertex waves, sleep spindles (12-15Hz ), maximal frontocentral region.  EEG showed rhythmic sharply contoured 3 to 6 Hz theta-delta slowing in right>left occipital region. Lateralized periodic discharges with fluctuating frequency of 0.25-1hz  were also noted in right occipital region. Hyperventilation and photic stimulation were not performed.      Seizures without clinical signs were noted during the study. During the seizure EEG showed 12 to 13 Hz sharply contoured beta activity in right occipital region which then involve left occipital region and evolved into high amplitude sharply contoured 5 to 6 Hz theta slowing followed by 2 to 3 Hz delta slowing. Seizures were noted on 06/10/2024 at 0127 and 0553, lasting for about 1 and half minute each.  ABNORMALITY - Focal seizure, right occipital region - Background asymmetry, right<left - Intermittent slow, right>left occipital region    IMPRESSION: This study showed focal seizures without clinical signs arising from right occipital region. Seizures were noted on 06/10/2024 at 0127 and 0553, lasting for about 1 and half minute each.  Additionally there was cortical dysfunction in right > left occipital region likely secondary to underlying structural abnormality.   Caitriona Sundquist O Argelia Formisano

## 2024-06-10 NOTE — Progress Notes (Signed)
 Inpatient Rehab Admissions Coordinator:     I met with pt. To discuss potential CIR admit. She is interested. States her daughter and DIL can be with her at d/c. I will send case to insurance and pursue for admit.   Wandalee Gust, MS, CCC-SLP Rehab Admissions Coordinator  [phone] (celll) [phone] (office)

## 2024-06-10 NOTE — Progress Notes (Signed)
 LTM maint complete - no skin breakdown under: Fp2 A1  Serviced F[facility] monitored, Event button test confirmed by [facility].

## 2024-06-10 NOTE — PMR Pre-admission (Shared)
 PMR Admission Coordinator Pre-Admission Assessment  Patient: Gabriela Moyer is an 83 y.o., female MRN: [id number] DOB: 08-14-1941 Height: 5' 2 (157.5 cm) Weight: 74 kg              Insurance Information HMO:     PPO: yes     PCP:      IPA:      80/20:      OTHER:  PRIMARY: [employer] P[employer]      Policy#: [id number], Medicare: [id number]      Subscriber: pt CM Name: ***      Phone#: ***     Fax#: [phone] Pre-Cert#: [id number] approval from [place] for 7 days (need to admit with 5 days or auth will need update)      Employer: *** Benefits:  Phone #: ***     Name: Venson Ginger Date: 12/31/2023 - 12/29/2024 Deductible: no deductible ($0) OOP Max: $3,400 ($17.26 met) CIR: $325/day co-pay for days 1-6, $0/day days 7-90 SNF:  $0/day co-pay for days 1-20, $214/day co-pay for days 21-100; limited to 100 days/benefit period Outpatient: $15/visit co-pay; limited by medical necessity Home Health:  100% coverage DME: 75% coverage; 25% co-insurance Providers: in network  SECONDARY:       Policy#:       Phone#:   Artist:       Phone#:   The Data processing manager" for patients in Inpatient Rehabilitation Facilities with attached "Privacy Act Statement-Health Care Records" was provided and verbally reviewed with: Patient  Emergency Contact Information Contact Information     Name Relation Home Work Mobile   Reichelt,bobby Son   [phone]   Deane Ewing Daughter [phone]  [phone]   Arden Kotyk Sister   [phone]      Other Contacts   None on File    Current Medical History  Patient Admitting Diagnosis: CVA History of Present Illness: Gabriela Moyer is a 83 y.o. female with PMHx of  has a past medical history of Carotid arterial disease (HCC), Diastolic dysfunction, History of stress test, Hyperlipidemia, Hypertension, PAF (paroxysmal atrial fibrillation) (HCC), Renal artery stenosis (HCC), and Subclavian arterial stenosis (HCC). . They were  admitted to [facility] on 06/06/2024 for headache intractable for 4 days with vertigo and a right visual field cut.  On ED evaluation, CT head showed amorphous high density areas in the left frontal cortex and sulci, a 6 mm hyperdense area in the right lateral ventricle, along with a low-density area in the right occipital lobe.  Follow-up MRI showed leptomeningeal enhancement in the right occipital lobe concerning for meningitis/encephalitis, however neurology was not concerned given patient's clinical symptoms. 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  EEG showed status epilepticus, the patient was started on Keppra  and Depakote .  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  CT chest, abdomen, pelvis ordered to evaluate for malignancy, resulted 6/11 with 6 mm right upper lobe pulmonary nodule, along with diverticulosis and a 10 mm left renal stone..  This a.m., had repeat EEG showing right occipital focal seizures ongoing.  Hospitalization has been complicated by hypokalemia, hypothyroidism, ongoing headaches with nausea, paroxysmal A-fib, QT prolongation, and hypertension.  PM&R was consulted to evaluate appropriateness for IPR admission.  Complete NIHSS TOTAL: 0 Glasgow Coma Scale Score: 15  Patient's medical record from  [facility] has been reviewed by the rehabilitation admission coordinator and physician.  Past Medical History  Past Medical History:  Diagnosis Date   Carotid arterial disease (HCC)    a. 08/2016 Carotid U/S: 1-39% bilat ICA stenosis, >50 R ECA stenosis.   Diastolic dysfunction    a. 11/2016 Echo: EF 65-70%, Gr1 DD.   History of stress test    a. 12/2016 Lexiscan  MV: EF 73%, no ischemia/infarct.    Hyperlipidemia    Hypertension    PAF (paroxysmal atrial fibrillation) (HCC)    a. 11/2016 AF RVR in ED-->converted on IV dilt-->CHA2DS2VASc = 4-->Xarelto .   Renal artery stenosis (HCC)    a. 06/2009 s/p R Renal Artery PTA/stenting; b. 08/2016 Renal Duplex: nl LRA, >60% RRA stenosis- f/u 1 yr.   Subclavian arterial stenosis (HCC)    a. 08/2014 Upper Ext Duplex: R distal Chauncey & Ax - 70-99% diam reduction, L distal Jud & Ax - 50-69%--stable.    Has the patient had major surgery during 100 days prior to admission? No  Family History  family history includes Cancer in her brother and father; Heart Problems in her maternal grandfather and maternal grandmother; Heart disease in her mother; Stroke in her mother.   Current Medications   Current Facility-Administered Medications:    acetaminophen  (TYLENOL ) tablet 650 mg, 650 mg, Oral, Q4H PRN, 650 mg at 06/09/24 0236 **OR** acetaminophen  (TYLENOL ) 160 MG/5ML solution 650 mg, 650 mg, Per Tube, Q4H PRN **OR** acetaminophen  (TYLENOL ) suppository 650 mg, 650 mg, Rectal, Q4H PRN, Sundil, Subrina, MD   aspirin  EC tablet 81 mg, 81 mg, Oral, Daily, Consuelo Denmark, MD, 81 mg at 06/10/24 1002   atorvastatin  (LIPITOR) tablet 40 mg, 40 mg, Oral, Daily, Consuelo Denmark, MD, 40 mg at 06/10/24 1002   butalbital -acetaminophen -caffeine  (FIORICET ) 50-325-40 MG per tablet 1 tablet, 1 tablet, Oral, Q6H PRN, Sundil, Subrina, MD, 1 tablet at 06/09/24 0202   diphenhydrAMINE  (BENADRYL ) capsule 50 mg, 50 mg, Oral, Q6H PRN, Pham, Minh Q, RPH-CPP   divalproex  (DEPAKOTE ) DR tablet 750 mg, 750 mg, Oral, Q12H, Shafer, Devon, NP, 750 mg at 06/10/24 1003   levETIRAcetam  (KEPPRA ) undiluted injection 1,500 mg, 1,500 mg, Intravenous, Q12H **OR** levETIRAcetam  (KEPPRA ) tablet 1,500 mg, 1,500 mg, Oral, Q12H, Shafer, Devon, NP, 1,500 mg at 06/10/24 1003   levothyroxine  (SYNTHROID ) tablet 25 mcg, 25 mcg, Oral, Daily, Sreeram, Narendranath, MD, 25 mcg at 06/10/24 1003   metoprolol  succinate  (TOPROL -XL) 24 hr tablet 25 mg, 25 mg, Oral, Daily, Sreeram, Narendranath, MD, 25 mg at 06/10/24 1003   potassium chloride  SA (KLOR-CON  M) CR tablet 40 mEq, 40 mEq, Oral, BID, Sreeram, Narendranath, MD, 40 mEq at 06/10/24 1002   senna-docusate (Senokot-S) tablet 1 tablet, 1 tablet, Oral, QHS PRN, Sundil, Subrina, MD, 1 tablet at 06/08/24 2202   sodium chloride  0.9 % bolus 500 mL, 500 mL, Intravenous, Once, Aisha Hove, MD, Held at 06/07/24 1418   sodium chloride  flush (NS) 0.9 % injection 3-10 mL, 3-10 mL, Intravenous, Q12H, Sundil, Subrina, MD, 10 mL at 06/10/24 1003   sodium chloride  flush (NS) 0.9 % injection 3-10 mL, 3-10 mL, Intravenous, PRN, Sundil, Subrina, MD   trimethobenzamide  (TIGAN ) injection 200 mg, 200 mg, Intramuscular, Q6H PRN, Sundil, Subrina, MD  Patients Current Diet:  Diet Order             Diet Heart Fluid consistency: Thin  Diet effective now  Precautions / Restrictions Precautions Precautions: Fall Precaution/Restrictions Comments: left visual field cut, orthostatic Restrictions Weight Bearing Restrictions Per Provider Order: No   Has the patient had 2 or more falls or a fall with injury in the past year?No  Prior Activity Level Community (5-7x/wk): Pt. active  in the community PTA  Prior Functional Level Prior Function Prior Level of Function : Driving Mobility Comments: Independent without AD, driving, denies falls in the past 6 months. ADLs Comments: Bathes & dresses herself, grocery shops. Has a 84 y/o Goldendoodle named Georgia .  Self Care: Did the patient need help bathing, dressing, using the toilet or eating?  Independent  Indoor Mobility: Did the patient need assistance with walking from room to room (with or without device)? Independent  Stairs: Did the patient need assistance with internal or external stairs (with or without device)? Independent  Functional Cognition: Did the patient need help planning regular  tasks such as shopping or remembering to take medications? Independent  Patient Information Are you of Hispanic, Latino/a,or Spanish origin?: A. No, not of Hispanic, Latino/a, or Spanish origin What is your race?: A. White Do you need or want an interpreter to communicate with a doctor or health care staff?: 0. No  Patient's Response To:  Health Literacy and Transportation Is the patient able to respond to health literacy and transportation needs?: Yes Health Literacy - How often do you need to have someone help you when you read instructions, pamphlets, or other written material from your doctor or pharmacy?: Never In the past 12 months, has lack of transportation kept you from medical appointments or from getting medications?: No In the past 12 months, has lack of transportation kept you from meetings, work, or from getting things needed for daily living?: No  Home Assistive Devices / Equipment Home Equipment: Tub bench  Prior Device Use: Indicate devices/aids used by the patient prior to current illness, exacerbation or injury? None of the above  Current Functional Level Cognition  Arousal/Alertness: Awake/alert Overall Cognitive Status: Within Functional Limits for tasks assessed Orientation Level: Oriented X4    Extremity Assessment (includes Sensation/Coordination)  Upper Extremity Assessment: Overall WFL for tasks assessed  Lower Extremity Assessment: Defer to PT evaluation    ADLs  Overall ADL's : Needs assistance/impaired Eating/Feeding: Minimal assistance Eating/Feeding Details (indicate cue type and reason): minA to locate items on the L side of tray table Grooming: Wash/dry hands, Wash/dry face, Set up, Sitting Upper Body Bathing: Set up, Sitting Lower Body Bathing: Minimal assistance, Sit to/from stand Upper Body Dressing : Supervision/safety, Sitting Lower Body Dressing: Minimal assistance, Sit to/from stand Lower Body Dressing Details (indicate cue type and  reason): For donning her pants Toilet Transfer: Minimal assistance Toilet Transfer Details (indicate cue type and reason): min A to correct bumping into things on her L side Toileting- Clothing Manipulation and Hygiene: Minimal assistance, Sit to/from stand Functional mobility during ADLs: Minimal assistance, Rolling walker (2 wheels) General ADL Comments: pt requires assist to manage RW to avoid bumping into things in the L environment    Mobility  Overal bed mobility: Modified Independent Bed Mobility: Supine to Sit Supine to sit: Modified independent (Device/Increase time), HOB elevated Sit to supine: Modified independent (Device/Increase time), HOB elevated General bed mobility comments: Pt left in recliner at end of session    Transfers  Overall transfer level: Needs assistance Equipment used: None Transfers: Sit to/from Stand, Bed to chair/wheelchair/BSC Sit to Stand: Supervision Bed to/from chair/wheelchair/BSC transfer type:: Step pivot Step pivot transfers: Supervision  General transfer comment: supervision for sit to stand and step pivot transfers this session with assist with lines. No LOB    Ambulation / Gait / Stairs / Psychologist, prison and probation services  Ambulation/Gait Ambulation/Gait assistance: Min assist, Contact guard assist Gait Distance (Feet): 150 Feet Assistive device: Rolling walker (2 wheels) Gait Pattern/deviations: Decreased step length - right, Festinating, Decreased stride length, Drifts right/left General Gait Details: deferred this date due to video monitored EEG and pt wanted to eat lunch Gait velocity: decreased Gait velocity interpretation: <1.8 ft/sec, indicate of risk for recurrent falls Pre-gait activities: pt took steady steps from EOB to recliner without UE support    Posture / Balance Balance Overall balance assessment: Needs assistance Sitting-balance support: Feet supported Sitting balance-Leahy Scale: Good Standing balance support: Single extremity  supported, During functional activity, Bilateral upper extremity supported Standing balance-Leahy Scale: Fair Standing balance comment: Posterior LOB noted intermittently when running into objects on the L    Special needs/care consideration Special service needs ***     Previous Home Environment (from acute therapy documentation) Living Arrangements: Alone  Lives With: Alone Available Help at Discharge: Family Type of Home: House Home Layout: One level Home Access: Stairs to enter Entrance Stairs-Rails: None Entrance Stairs-Number of Steps: 1 Bathroom Shower/Tub: Engineer, manufacturing systems: Handicapped height Bathroom Accessibility: Yes How Accessible: Accessible via wheelchair, Accessible via walker Home Care Services: No  Discharge Living Setting Plans for Discharge Living Setting: Patient's home Type of Home at Discharge: House Discharge Home Layout: One level, Other (Comment) Discharge Home Access: Stairs to enter Entrance Stairs-Rails: None Entrance Stairs-Number of Steps: 1 Discharge Bathroom Shower/Tub: Tub/shower unit Discharge Bathroom Toilet: Handicapped height Discharge Bathroom Accessibility: No Does the patient have any problems obtaining your medications?: Yes (Describe)  Social/Family/Support Systems Patient Roles: Other (Comment) Contact Information: [phone] Anticipated Caregiver: Deane Ewing Caregiver Availability: 24/7 Discharge Plan Discussed with Primary Caregiver: Yes Is Caregiver In Agreement with Plan?: Yes Does Caregiver/Family have Issues with Lodging/Transportation while Pt is in Rehab?: Yes   Goals Patient/Family Goal for Rehab: PT/OT Mod I Expected length of stay: 5-7 days Pt/Family Agrees to Admission and willing to participate: Yes Program Orientation Provided & Reviewed with Pt/Caregiver Including Roles  & Responsibilities: Yes   Decrease burden of Care through IP rehab admission: Not anticipated   Possible need for SNF  placement upon discharge:Not anticipated    Patient Condition: {PATIENT'S CONDITION:22832}  Preadmission Screen Completed By:  Dorena Gander, CCC-SLP, 06/10/2024 12:54 PM ______________________________________________________________________   Discussed status with Dr. Aaron Aason***at *** and received approval for admission today.  Admission Coordinator:  Dorena Gander, time***/Date***

## 2024-06-10 NOTE — Progress Notes (Signed)
 LTM maint complete - no skin breakdown under:  A1, P8, F7

## 2024-06-10 NOTE — Plan of Care (Signed)

## 2024-06-11 ENCOUNTER — Inpatient Hospital Stay (HOSPITAL_COMMUNITY)

## 2024-06-11 ENCOUNTER — Encounter (HOSPITAL_COMMUNITY)

## 2024-06-11 DIAGNOSIS — G939 Disorder of brain, unspecified: Secondary | ICD-10-CM

## 2024-06-11 DIAGNOSIS — I4891 Unspecified atrial fibrillation: Secondary | ICD-10-CM | POA: Diagnosis not present

## 2024-06-11 DIAGNOSIS — I639 Cerebral infarction, unspecified: Secondary | ICD-10-CM | POA: Diagnosis not present

## 2024-06-11 DIAGNOSIS — I6350 Cerebral infarction due to unspecified occlusion or stenosis of unspecified cerebral artery: Secondary | ICD-10-CM

## 2024-06-11 DIAGNOSIS — I10 Essential (primary) hypertension: Secondary | ICD-10-CM | POA: Diagnosis not present

## 2024-06-11 DIAGNOSIS — R569 Unspecified convulsions: Secondary | ICD-10-CM | POA: Diagnosis not present

## 2024-06-11 DIAGNOSIS — N179 Acute kidney failure, unspecified: Secondary | ICD-10-CM | POA: Diagnosis not present

## 2024-06-11 DIAGNOSIS — Z8679 Personal history of other diseases of the circulatory system: Secondary | ICD-10-CM | POA: Diagnosis not present

## 2024-06-11 LAB — CBC
HCT: 41.3 % (ref 36.0–46.0)
Hemoglobin: 13.6 g/dL (ref 12.0–15.0)
MCH: 30.2 pg (ref 26.0–34.0)
MCHC: 32.9 g/dL (ref 30.0–36.0)
MCV: 91.8 fL (ref 80.0–100.0)
Platelets: 232 10*3/uL (ref 150–400)
RBC: 4.5 MIL/uL (ref 3.87–5.11)
RDW: 12.6 % (ref 11.5–15.5)
WBC: 12.4 10*3/uL — ABNORMAL HIGH (ref 4.0–10.5)
nRBC: 0 % (ref 0.0–0.2)

## 2024-06-11 LAB — VALPROIC ACID LEVEL: Valproic Acid Lvl: 70 ug/mL (ref 50–100)

## 2024-06-11 LAB — BASIC METABOLIC PANEL WITH GFR
Anion gap: 11 (ref 5–15)
BUN: 23 mg/dL (ref 8–23)
CO2: 22 mmol/L (ref 22–32)
Calcium: 9.5 mg/dL (ref 8.9–10.3)
Chloride: 111 mmol/L (ref 98–111)
Creatinine, Ser: 1.16 mg/dL — ABNORMAL HIGH (ref 0.44–1.00)
GFR, Estimated: 46 mL/min — ABNORMAL LOW (ref >60)
Glucose, Bld: 91 mg/dL (ref 70–99)
Potassium: 4.5 mmol/L (ref 3.5–5.1)
Sodium: 144 mmol/L (ref 135–145)

## 2024-06-11 LAB — AMMONIA: Ammonia: 71 umol/L — ABNORMAL HIGH (ref 9–35)

## 2024-06-11 MED ORDER — ONDANSETRON HCL 4 MG/2ML IJ SOLN
4.0000 mg | Freq: Once | INTRAMUSCULAR | Status: AC
  Administered 2024-06-11: 4 mg via INTRAVENOUS
  Filled 2024-06-11: qty 2

## 2024-06-11 MED ORDER — FAMOTIDINE 20 MG PO TABS
40.0000 mg | ORAL_TABLET | Freq: Every day | ORAL | Status: DC
  Administered 2024-06-11 – 2024-06-25 (×13): 40 mg via ORAL
  Filled 2024-06-11 (×13): qty 2

## 2024-06-11 MED ORDER — POTASSIUM CHLORIDE CRYS ER 20 MEQ PO TBCR
20.0000 meq | EXTENDED_RELEASE_TABLET | Freq: Every day | ORAL | Status: DC
  Administered 2024-06-12 – 2024-06-25 (×12): 20 meq via ORAL
  Filled 2024-06-11 (×12): qty 1

## 2024-06-11 NOTE — Plan of Care (Signed)
  Problem: Education: Goal: Knowledge of secondary prevention will improve (MUST DOCUMENT ALL) Outcome: Progressing Goal: Knowledge of patient specific risk factors will improve (DELETE if not current risk factor) Outcome: Progressing   Problem: Ischemic Stroke/TIA Tissue Perfusion: Goal: Complications of ischemic stroke/TIA will be minimized Outcome: Progressing   Problem: Coping: Goal: Will verbalize positive feelings about self Outcome: Progressing Goal: Will identify appropriate support needs Outcome: Progressing   Problem: Health Behavior/Discharge Planning: Goal: Ability to manage health-related needs will improve Outcome: Progressing Goal: Goals will be collaboratively established with patient/family Outcome: Progressing   Problem: Self-Care: Goal: Ability to participate in self-care as condition permits will improve Outcome: Progressing

## 2024-06-11 NOTE — Progress Notes (Signed)
 NEUROLOGY CONSULT FOLLOW UP NOTE   Date of service: June 11, 2024 Patient Name: Gabriela Moyer MRN:  [id number] DOB:  [birth year]  Interval Hx/subjective  The vertigo she had been experiencing earlier is now almost gone.   Vitals   Vitals:   06/10/24 2335 06/11/24 0447 06/11/24 0748 06/11/24 1157  BP: 137/67 (!) 147/64 (!) 151/52 (!) 139/54  Pulse: 63 63 (!) 59 65  Resp: 18 18  19   Temp: 97.6 F (36.4 C) 98 F (36.7 C) 97.6 F (36.4 C) 98 F (36.7 C)  TempSrc: Oral Oral Oral Oral  SpO2: 97% 97% 97% 98%  Weight:      Height:         Body mass index is 29.84 kg/m.  Physical Exam   Constitutional: Appears well-developed and well-nourished.  Psych: Affect appropriate to situation.  Eyes: No scleral injection.  HENT: No OP obstrucion.  Head: Normocephalic. EEG leads in place.  Respiratory: Effort normal, non-labored breathing.  Neurologic Examination   Mental Status: Awake and alert. Oriented to the month, day of week, year, city, state and circumstance. Speech is fluent with intact comprehension for general questions and all motor commands. Bright affect.  Cranial Nerves: II: Left homonymous hemianopsia.   III,IV, VI: No ptosis. EOMI. No nystagmus.  VII: Smile symmetric VIII: Hearing intact to conversation IX,X: No hoarseness or hypophonia XI: Symmetric XII: No lingual dysarthria Motor: Right :  Upper extremity   5/5                                      Left:     Upper extremity   5/5             Lower extremity   5/5                                                  Lower extremity   5/5 No pronator drift Sensory: Light touch intact throughout, bilaterally. No extinction to DSS with fine touch stimuli to both forearms.   Cerebellar: No ataxia with FNF bilaterally Gait: Deferred  Medications  Current Facility-Administered Medications:    acetaminophen  (TYLENOL ) tablet 650 mg, 650 mg, Oral, Q4H PRN, 650 mg at 06/09/24 0236 **OR** acetaminophen  (TYLENOL ) 160 MG/5ML  solution 650 mg, 650 mg, Per Tube, Q4H PRN **OR** acetaminophen  (TYLENOL ) suppository 650 mg, 650 mg, Rectal, Q4H PRN, Sundil, Subrina, MD   aspirin  EC tablet 81 mg, 81 mg, Oral, Daily, Consuelo Denmark, MD, 81 mg at 06/11/24 0940   atorvastatin  (LIPITOR) tablet 40 mg, 40 mg, Oral, Daily, Consuelo Denmark, MD, 40 mg at 06/11/24 8469   butalbital -acetaminophen -caffeine  (FIORICET ) 50-325-40 MG per tablet 1 tablet, 1 tablet, Oral, Q6H PRN, Sundil, Subrina, MD, 1 tablet at 06/11/24 1033   diphenhydrAMINE  (BENADRYL ) capsule 50 mg, 50 mg, Oral, Q6H PRN, Pham, Minh Q, RPH-CPP   divalproex  (DEPAKOTE ) DR tablet 750 mg, 750 mg, Oral, Q12H, Shafer, Devon, NP, 750 mg at 06/11/24 0939   famotidine (PEPCID) tablet 40 mg, 40 mg, Oral, Daily, Sreeram, Narendranath, MD, 40 mg at 06/11/24 1020   levETIRAcetam  (KEPPRA ) undiluted injection 1,500 mg, 1,500 mg, Intravenous, Q12H, 1,500 mg at 06/10/24 2311 **OR** levETIRAcetam  (KEPPRA ) tablet 1,500 mg, 1,500 mg, Oral, Q12H, Shafer, Devon, NP, 1,500 mg at  06/11/24 0940   levothyroxine  (SYNTHROID ) tablet 25 mcg, 25 mcg, Oral, Daily, Sreeram, Narendranath, MD, 25 mcg at 06/11/24 1610   metoprolol  succinate (TOPROL -XL) 24 hr tablet 25 mg, 25 mg, Oral, Daily, Sreeram, Narendranath, MD, 25 mg at 06/11/24 9604   potassium chloride  SA (KLOR-CON  M) CR tablet 40 mEq, 40 mEq, Oral, BID, Aisha Hove, MD, 40 mEq at 06/11/24 0939   senna-docusate (Senokot-S) tablet 1 tablet, 1 tablet, Oral, QHS PRN, Sundil, Subrina, MD, 1 tablet at 06/08/24 2202   sodium chloride  0.9 % bolus 500 mL, 500 mL, Intravenous, Once, Aisha Hove, MD, Held at 06/07/24 1418   sodium chloride  flush (NS) 0.9 % injection 3-10 mL, 3-10 mL, Intravenous, Q12H, Sundil, Subrina, MD, 5 mL at 06/11/24 0940   sodium chloride  flush (NS) 0.9 % injection 3-10 mL, 3-10 mL, Intravenous, PRN, Sundil, Subrina, MD   trimethobenzamide  (TIGAN ) injection 200 mg, 200 mg, Intramuscular, Q6H PRN, Sundil, Subrina, MD  Labs and  Diagnostic Imaging   CBC:  Recent Labs  Lab 06/06/24 1458 06/06/24 1503 06/07/24 0347 06/11/24 0709  WBC 11.2*  --  9.5 12.4*  NEUTROABS 8.9*  --   --   --   HGB 15.7*   < > 13.6 13.6  HCT 46.0   < > 39.8 41.3  MCV 89.0  --  90.2 91.8  PLT 321  --  268 232   < > = values in this interval not displayed.    Basic Metabolic Panel:  Lab Results  Component Value Date   NA 144 06/11/2024   K 4.5 06/11/2024   CO2 22 06/11/2024   GLUCOSE 91 06/11/2024   BUN 23 06/11/2024   CREATININE 1.16 (H) 06/11/2024   CALCIUM  9.5 06/11/2024   GFRNONAA 46 (L) 06/11/2024   GFRAA 71 06/21/2019   Lipid Panel:  Lab Results  Component Value Date   LDLCALC 132 (H) 06/07/2024   HgbA1c:  Lab Results  Component Value Date   HGBA1C 5.5 06/07/2024   Urine Drug Screen: No results found for: LABOPIA, COCAINSCRNUR, LABBENZ, AMPHETMU, THCU, LABBARB  Alcohol  Level     Component Value Date/Time   [facility] <15 06/06/2024 1458   INR  Lab Results  Component Value Date   INR 1.0 06/06/2024   APTT  Lab Results  Component Value Date   APTT 27 06/06/2024   Summary of imaging studies:    - MRI of the brain demonstrates multiple abnormalities including a right PCA stroke along with leptomeningeal/pial-arachnoid enhancement in the posterior left frontal lobe and high left parietal lobe corresponding to area of abnormality on CT. In addition, there is diffuse dural enhancement on the right.  The right occipital lobe infarction may be due to an infiltrative process as there is subtle sulcal enhancement at this location as well.  - MRI of the cervical, thoracic and lumbar spine demonstrates subtle leptomeningeal enhancement about the cervical cord and thoracic spinal cord most prominently at T11-T12 with leptomeningeal enhancement of the distal cord as well - CT Chest Abdomen and Pelvis shows no evidence for primary malignancy.  Summary of pertinent labs: - Quantiferon-TB gold Plus is negative -  RPR negative  Autoimmune encephalopathy panel is pan-negative:   EKG: Atrial fibrillation with rapid ventricular response;  EEGs: - Spot EEG: This study showed 2 focal seizures without clinical signs arising from right occipital region at 0825 and 0833 lasting for about 1 minute 10 seconds each. Additionally there was evidence of cortical dysfunction in right occipital region likely  secondary to underlying structural abnormality. - LTM read 6/10 demonstrated focal seizures in right oiccipital region. Depakote  500 mg BID was started at that time.  - LTM read 6/11 shows additional 3-4 seizures per hour. Increased Depakote  on 6/11 to 750 mg BID - LTM EEG report for 6/12: Focal seizure, right occipital region; background asymmetry, right<left; intermittent slow, right>left occipital region. This study showed focal seizures without clinical signs arising from the right occipital region. Seizures were noted on 06/10/2024 at 0127 and 0553, lasting for about 1 and half minute each. Additionally there was cortical dysfunction in right > left occipital region likely secondary to underlying structural abnormality.   Assessment  NIKAYA NASBY is an 83 y.o. female with who presented with her typical migraine headache symptoms along with nausea, vertigo and vision changes. 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 She was loaded with Depakote  and Keppra  was later added. Titration of her AEDs resulted in lowering of the frequency of the electrographic seizures and then resolution.    - Exam today is unchanged since yesterday.  - LTM EEG report for this morning demonstrates improvement: The study showed cortical dysfunction in right > left occipital region, likely secondary to underlying structural abnormality, but with no definite seizures seen. Recent Labs:  - VPA level this morning is therapeutic at 70 -  Ammonia level this morning (Friday) is elevated at 71. Will repeat level on Saturday AM.  - ANA Ab, IFA: negative  - - Ideally CSF studies are needed, but LPs x 2 have not resulted in CSF to analyze. Unable to access thecal sac on 1st attempt. On second attempt, the radiologist got access to the thecal sac 3 times at 3 separate levels and no CSF came out. This likely means that the CSF pressure is too low or the CSF itself is too viscous to drain. Another LP attempt would be likely to be futile. A cervical tap could be attempted, but this can only be performed by Neurosurgery.  - Impression: - Differential for her lesions on MRI, with associated new onset seizures, is broad and includes CNS lymphoma, vasculitis, leptomeningeal carcinomatosis secondary to metastatic solid tumor and neurosarcoidosis.  - Bacterial meningitis is essentially off the DDx given that she is not encephalopathic, has no fever has only a mildly elevated white count and has no neck stiffness A fungal meningitis is also possible but unlikely given the lack of meningismus with intact mentation.  - DDx for her right occipital lobe stroke includes atrial fibrillation (see EKG result above) versus an infiltrative process resulting in a vasculopathy at this location (see MRI discussion above).  - We feel that she may benefit from a biopsy of her left parietofrontal enhancing lesion as seen on MRI, which almost certainly the cause for her seizures.     Recommendations  - Hospitalist has discussed options with Neurosurgery, who are more in favor of another attempt at an LP Monday, which Neurosurgery feels has a relatively high likelihood of yielding CSF in their hands. Per Hospitalist, Dr. Larrie Po feels that brain biopsy would be less beneficial as an option than repeat attempt at LP.   - If 3rd attempt at obtaining CSF is not successful, then recommend discussing again with Neurosurgery the possibility of a biopsy. Optimal site for biopsy  would most likely be the left frontal-parietal subarachnoid enhancing abnormality seen on MRI.  - Repeat ammonia level ordered for  Saturday morning.  - Lyme PCR and serum ACE are pending - Continue Depakote  at 750mg  BID.  - Continue scheduled Keppra  at 1500 mg BID  - Continue LTM EEG. If no further seizures as of Saturday morning, will discontinue. If LTM tomorrow shows recurrence of seizures, may start clonazepam or Onfi.  ______________________________________________________________________   Hope Ly, Adaora Mchaney, MD [facility]

## 2024-06-11 NOTE — Progress Notes (Signed)
 LTM maint complete - no skin breakdown seen Repaired O2 [facility] monitored, Event button test confirmed by [facility].

## 2024-06-11 NOTE — Procedures (Signed)
 Patient Name: Gabriela Moyer  MRN: [id number]  Epilepsy Attending: Arleene Lack  Referring Physician/Provider: Arleene Lack, MD  Duration: 06/10/2024 1515 to 06/11/2024 1515   Patient history: 83 y.o. female with visual disturbance. EEG to evaluate for seizure.    Level of alertness: Awake, asleep   AEDs during EEG study: LEV, VPA   Technical aspects: This EEG study was done with scalp electrodes positioned according to the 10-20 International system of electrode placement. Electrical activity was reviewed with band pass filter of 1-70Hz , sensitivity of 7 uV/mm, display speed of 31mm/sec with a 60Hz  notched filter applied as appropriate. EEG data were recorded continuously and digitally stored.  Video monitoring was available and reviewed as appropriate.   Description: The posterior dominant rhythm consists of 8-9 Hz activity of moderate voltage (25-35 uV) seen predominantly in posterior head regions, asymmetric ( right<left) and reactive to eye opening and eye closing.  Sleep was characterized by vertex waves, sleep spindles (12-15Hz ), maximal frontocentral region.  EEG showed rhythmic sharply contoured 3 to 6 Hz theta-delta slowing in right>left occipital region. Hyperventilation and photic stimulation were not performed.      ABNORMALITY - Background asymmetry, right<left - Intermittent slow, right>left occipital region   IMPRESSION: This study showed cortical dysfunction in right > left occipital region likely secondary to underlying structural abnormality. No definite seizures were noted.   Addilyne Backs O Maritsa Hunsucker

## 2024-06-11 NOTE — Consult Note (Signed)
 Providing Compassionate, Quality Care - Together   Reason for Consult: Brain lesion Referring Physician: Dr. [name] is an 83 y.o. female.  HPI: Gabriela Moyer is an 83 year old woman who presented to the emergency department on 06/03/2024 with complaint of severe persistent right-sided headache. She was also experiencing vertigo and changes to her vision. EEG showed status epilepticus and patient was started on Keppra  and depakote  per Neurology's recommendations. Both Neurology and Interventional Radiology have attempted lumbar punctures, but were unable to obtain a sample. Neurosurgery was consulted for further evaluation and recommendations.  Patient presently denies headache or nausea. She's aware her vision is not at baseline. She does not feel one side is weaker than the other. She denies any changes to her speech.  Past Medical History:  Diagnosis Date   Carotid arterial disease (HCC)    a. 08/2016 Carotid U/S: 1-39% bilat ICA stenosis, >50 R ECA stenosis.   Diastolic dysfunction    a. 11/2016 Echo: EF 65-70%, Gr1 DD.   History of stress test    a. 12/2016 Lexiscan  MV: EF 73%, no ischemia/infarct.   Hyperlipidemia    Hypertension    PAF (paroxysmal atrial fibrillation) (HCC)    a. 11/2016 AF RVR in ED-->converted on IV dilt-->CHA2DS2VASc = 4-->Xarelto .   Renal artery stenosis (HCC)    a. 06/2009 s/p R Renal Artery PTA/stenting; b. 08/2016 Renal Duplex: nl LRA, >60% RRA stenosis- f/u 1 yr.   Subclavian arterial stenosis (HCC)    a. 08/2014 Upper Ext Duplex: R distal Bohem[place] & Ax - 70-99% diam reduction, L distal [place] & Ax - 50-69%--stable.    Past Surgical History:  Procedure Laterality Date   CARDIAC CATHETERIZATION  11/03/2007   atherosclerotic right renal artery (Dr. Jammie Mccune)   Carotid Doppler  12/2010   R & L ICA with small amount fibrous plaque; R distal subclav. 70-99% diameter reduction; L mid subclav 50-69% diameter reduction   Lower Extremity Arterial Doppler   03/2008   right ABI - mild arterial insuff at rest; R CIA with less than 50% diameter reduction, R & L SFA with less than 50% diameter reduction   NM MYOCAR PERF WALL MOTION  09/2009   bruce myoview  - normal perfusion, EF 915, low risk scan   RENAL ANGIOPLASTY  11/07/2009   5x69mm Cordis Genesis Aviator stent to ostium of right renal artery (Dr. Aleda Ammon)   Renal Artery Doppler  04/2012   celiac artery with >50% diameter reduction; R renal artery stent w/mildly elevated velocities (1-59% diameter reduction)   TRANSTHORACIC ECHOCARDIOGRAM  08/28/2009   EF=>55%; trace TR; mild AV regurg, mild pulm valve regurg   Upper Extremity Arterial Doppler  04/2012   right brachial pressure , left ; R distal subclavian & axillary arteries 70-99% diameter reduction; L distal subclavian/axillary & brachial arteries (50-69% diameter reduction)    Family History  Problem Relation Age of Onset   Heart disease Mother    Stroke Mother    Cancer Father    Heart Problems Maternal Grandmother    Heart Problems Maternal Grandfather    Cancer Brother     Social History:  reports that she has never smoked. She has been exposed to tobacco smoke. She has never used smokeless tobacco. She reports that she does not drink alcohol  and does not use drugs.  Allergies:  Allergies  Allergen Reactions   Ezetimibe      Other Reaction(s): muscle aches, arm cramps   Codeine Nausea And  Vomiting    Abdominal pain   Contrast Media [Iodinated Contrast Media] Nausea And Vomiting    Abdominal pain Caused low blood pressure   Morphine Nausea And Vomiting    Abdominal pain    Medications: I have reviewed the patient's current medications.  Results for orders placed or performed during the hospital encounter of 06/06/24 (from the past 48 hours)  Potassium     Status: None   Collection Time: 06/10/24 11:05 AM  Result Value Ref Range   Potassium 3.9 3.5 - 5.1 mmol/L    Comment: Performed at [facility]  Lab, [address], [place], [place] [address]  Valproic acid  level     Status: None   Collection Time: 06/11/24  7:09 AM  Result Value Ref Range   Valproic Acid  Lvl 70 50 - 100 ug/mL    Comment: Performed at [facility] Lab, [address], [place], [place] [address]  CBC     Status: Abnormal   Collection Time: 06/11/24  7:09 AM  Result Value Ref Range   WBC 12.4 (H) 4.0 - 10.5 K/uL   RBC 4.50 3.87 - 5.11 MIL/uL   Hemoglobin 13.6 12.0 - 15.0 g/dL   HCT 86.5 78.4 - 69.6 %   MCV 91.8 80.0 - 100.0 fL   MCH 30.2 26.0 - 34.0 pg   MCHC 32.9 30.0 - 36.0 g/dL   RDW 29.5 28.4 - 13.2 %   Platelets 232 150 - 400 K/uL   nRBC 0.0 0.0 - 0.2 %    Comment: Performed at [facility] Lab, [address], [place], [place] [address]  Basic metabolic panel with GFR     Status: Abnormal   Collection Time: 06/11/24  7:09 AM  Result Value Ref Range   Sodium 144 135 - 145 mmol/L   Potassium 4.5 3.5 - 5.1 mmol/L   Chloride 111 98 - 111 mmol/L   CO2 22 22 - 32 mmol/L   Glucose, Bld 91 70 - 99 mg/dL    Comment: Glucose reference range applies only to samples taken after fasting for at least 8 hours.   BUN 23 8 - 23 mg/dL   Creatinine, Ser 2.72 (H) 0.44 - 1.00 mg/dL   Calcium  9.5 8.9 - 10.3 mg/dL   GFR, Estimated 46 (L) >60 mL/min    Comment: (NOTE) Calculated using the CKD-EPI Creatinine Equation (2021)    Anion gap 11 5 - 15    Comment: Performed at [facility] Lab, [address] [place], [place] [address]  Ammonia     Status: Abnormal   Collection Time: 06/11/24 11:08 AM  Result Value Ref Range   Ammonia 71 (H) 9 - 35 umol/L    Comment: Performed at [facility] Lab, [address] [place], [place] [address]    CT CHEST ABDOMEN PELVIS W CONTRAST Result Date: 06/09/2024 CLINICAL DATA:  Known leptomeningeal disease, evaluate for metastatic disease EXAM: CT CHEST, ABDOMEN, AND PELVIS WITH CONTRAST TECHNIQUE: Multidetector CT imaging of the chest, abdomen and pelvis was performed following  the standard protocol during bolus administration of intravenous contrast. RADIATION DOSE REDUCTION: This exam was performed according to the departmental dose-optimization program which includes automated exposure control, adjustment of the mA and/or kV according to patient size and/or use of iterative reconstruction technique. CONTRAST:  75mL OMNIPAQUE  IOHEXOL  350 MG/ML SOLN COMPARISON:  None Available. FINDINGS: CT CHEST FINDINGS Cardiovascular: Atherosclerotic calcifications of the thoracic aorta are noted. No aneurysmal dilatation or dissection  is seen. No cardiac enlargement is noted. Mild coronary calcifications are seen. The pulmonary artery as visualized shows no filling defect to suggest pulmonary embolism. Timing was not performed for embolus evaluation however. Mediastinum/Nodes: Thoracic inlet is within normal limits. No hilar or mediastinal adenopathy is noted. The esophagus as visualized is within normal limits. Lungs/Pleura: Lungs are well aerated bilaterally. A 1-2 mm right upper lobe nodule is noted laterally on image number 76 of series 6. No follow-up is recommended. No other nodules are seen. Minimal basilar atelectasis is seen. Musculoskeletal: No acute rib abnormality is noted. Degenerative changes of the thoracic spine are seen. CT ABDOMEN PELVIS FINDINGS Hepatobiliary: Gallbladder has been surgically removed. Mild biliary ductal dilatation is noted due to the post cholecystectomy state. Small cyst is noted within the left lobe of liver. Pancreas: Unremarkable. No pancreatic ductal dilatation or surrounding inflammatory changes. Spleen: Normal in size without focal abnormality. Adrenals/Urinary Tract: Adrenal glands are within normal limits. Kidneys demonstrate a normal enhancement pattern bilaterally. Multiple small simple appearing cysts are noted. No follow-up is recommended. A stone is noted in the upper pole of the left kidney measuring up to 10 mm with associated scarring identified. No  ureteral stones are seen. The bladder is within normal limits. Stomach/Bowel: Scattered diverticular change of the colon is noted without evidence of diverticulitis. No obstructive or inflammatory changes of the colon are seen. The appendix is not well visualized. No inflammatory changes to suggest appendicitis are noted. Small bowel and stomach are unremarkable. Vascular/Lymphatic: Aortic atherosclerosis. No enlarged abdominal or pelvic lymph nodes. Reproductive: Status post hysterectomy. No adnexal masses. Other: Minimal free fluid is noted within the pelvis of uncertain significance. Musculoskeletal: No acute or significant osseous findings. IMPRESSION: CT of the chest: Mild bibasilar atelectasis. Less than 6 mm right solid pulmonary nodule within the upper lobe. Per Fleischner Society Guidelines, if patient is low risk for malignancy, no routine follow-up imaging is recommended. If patient is high risk for malignancy, a non-contrast Chest CT at 12 months is optional. If performed and the nodule is stable at 12 months, no further follow-up is recommended. These guidelines do not apply to immunocompromised patients and patients with cancer. Follow up in patients with significant comorbidities as clinically warranted. For lung cancer screening, adhere to Lung-RADS guidelines. Reference: Radiology. 2017; 284(1):228-43. CT of the abdomen and pelvis: Diverticulosis without diverticulitis. Left renal stone in the upper pole measuring 10 mm with associated scarring. No other focal abnormality is noted. Electronically Signed   By: Violeta Grey M.D.   On: 06/09/2024 23:16    Review of Systems  Constitutional: Negative.   HENT: Negative.    Eyes:        Reports her vision is different than before.  Respiratory: Negative.    Cardiovascular: Negative.   Gastrointestinal: Negative.   Genitourinary: Negative.   Musculoskeletal: Negative.   Skin: Negative.   Neurological:  Positive for weakness and headaches.   Endo/Heme/Allergies: Negative.   Psychiatric/Behavioral: Negative.     Blood pressure (!) 139/54, pulse 65, temperature 98 F (36.7 C), temperature source Oral, resp. rate 19, height 5' 2 (1.575 m), weight 74 kg, SpO2 98%. Estimated body mass index is 29.84 kg/m as calculated from the following:   Height as of this encounter: 5' 2 (1.575 m).   Weight as of this encounter: 74 kg.  Physical Exam Constitutional:      Appearance: Normal appearance.  HENT:     Head: Normocephalic and atraumatic.     Nose:  Nose normal.     Mouth/Throat:     Mouth: Mucous membranes are moist.     Pharynx: Oropharynx is clear.   Eyes:     General: Visual field deficit present.     Extraocular Movements: Extraocular movements intact.     Pupils: Pupils are equal, round, and reactive to light.    Cardiovascular:     Rate and Rhythm: Normal rate and regular rhythm.     Pulses: Normal pulses.  Pulmonary:     Effort: Pulmonary effort is normal. No respiratory distress.  Abdominal:     General: Abdomen is flat.     Palpations: Abdomen is soft.   Musculoskeletal:        General: Normal range of motion.     Cervical back: Normal range of motion and neck supple. No rigidity.   Skin:    General: Skin is warm and dry.     Capillary Refill: Capillary refill takes less than 2 seconds.   Neurological:     Mental Status: She is alert and oriented to person, place, and time.     GCS: GCS eye subscore is 4. GCS verbal subscore is 5. GCS motor subscore is 6.     Motor: No pronator drift.     Coordination: Coordination is intact.     Comments: Left visual field deficit  Psychiatric:        Mood and Affect: Mood normal.        Behavior: Behavior normal.        Thought Content: Thought content normal.        Judgment: Judgment normal.     Assessment/Plan: Patient with leptomeningeal/pial-arachnoid enhancement in the posterior left frontal lobe and high left parietal lobe. Biopsy is not presently  appropriate in this patient's situation. Dr. Larrie Po will perform an LP on 06/14/2024 unless Neurology and Radiology are able to obtain a CSF sample prior to Monday.  I am in communication with my attending and they agree with the plan for this patient.   Gabriela Locus, Gabriela Moyer, Gabriela Moyer Nurse Practitioner  [facility] [address], [place], [place] [address] P: [phone]    F: [phone]  06/11/2024, 4:00 PM

## 2024-06-11 NOTE — Progress Notes (Addendum)
 MB performed maintenance on P7, A1, and O2 electrodes. All impedances are below 10k ohms. No skin breakdown noted.

## 2024-06-11 NOTE — Progress Notes (Signed)
 Progress Note   Patient: Gabriela Moyer UUV:[id number] DOB: Aug 12, 1941 DOA: 06/06/2024     5 DOS: the patient was seen and examined on 06/11/2024   Brief hospital course: Gabriela Moyer is a 83 y.o. female with medical history significant of chronic migraine, essential hypertension, hyperlipidemia, extensive  PAD of subclavian, axillary, brachial and renal artery status post right renal artery stent 2010, hypothyroidism and paroxysmal afibrillation not on Xarelto  due to history of GI bleed recent emergency department complaining of headache for 4 days which is right-sided with associated vertigo, some vision change with the onset of the headache.   CT head: Amorphous high-density areas noted in the left frontal region along the cortex and sulci. It is difficult to determine if this is subarachnoid hemorrhage or within the cortex.   6 mm small hyperdense area adjacent to the right lateral ventricle concerning for small brain lesion.   Low-density area in the right occipital lobe could reflect infarct or edema related to underlying lesion.   MRI brain showed: Leptomeningeal/pial-arachnoid enhancement in the posterior left frontal lobe and high left parietal lobe corresponding to area of abnormality on CT. No definite underlying parenchymal enhancement or edema. 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 Findings most concerning for meningitis and possible focus of encephalitis in the right occipital lobe. Recommend lumbar puncture for further evaluation.   Differential could also include leptomeningeal carcinomatosis. Complex migraine and associated vasospasm could potentially lead to infarct although the degree of enhancement especially in the frontoparietal lobes is atypical.   Diffuse dural enhancement noted.   Nodule along the surface of the right lateral ventricle with signal intensity similar to cortical  gray matter without associated enhancement, suggestive of gray matter heterotopia.   Unremarkable MRI of the orbits.  No findings of optic neuritis. Admitted for further management and neurological evaluation.  During hospital stay, EEG showed status epileptius, started Keprra, depakote  as per neurology recommendations. MRI spine - Suspected subtle leptomeningeal enhancement about the distal cord. Finding is nonspecific, and could be either infectious or inflammatory in nature. Possible neoplastic process with leptomeningeal carcinomatosis could also be considered. 5 attempts of LP failed.   Assessment and Plan: Acute CVA Abnormal MRI Brain Focal seizures  Presented with migraine headache, nausea, vertigo, right visual field cut. Reviewed imaging studies concerning for leptomeningeal process rule out infectious etiology, ?malignancy. Differential include CNS lymphoma, vasculitis, leptomeningeal carcinomatosis secondary to metastatic solid tumor, CNS TB. Continuous EEG showed focal seizures arising from right occipital lobe. Neurology recommended Keppra  with 1500bid, Depakote  750 q12.  5 attempts to obtain LP unsuccessful. Neurosurgery Dr.Jenkins advised to repeat attempt of spinal tap, as brain biopsy not appropriate in this case. I discussed with him again today, wishes to attempt LP on her this Monday before going for brain biopsy. CT chest/ abdomen/ pelvis showed 6mm right lung nodule need outpatient follow up, no malignancy detected. Hold Plavix  as she will get LP for further evaluation of her MRI findings. Follow neurology recommendations. PT/ OT advised CIR, awaiting neurology work up.  Hypokalemia: K improved. Oral potassium creased to 20 daily.  Hypothyroidism: TSH high, t3 pending. T4 1.54 Home dose synthroid  resumed.  Acute migraine headache with associated nausea Continue Fioricet  as needed. Tigan  for nausea.   Paroxysmal Afib RVR resolved Decreased home dose Toprol  to  xl 25 daily. Not on anticoagulation due to gi bleed. Continue telemetry monitoring.  QT prolongation: Avoid QT prolonging drugs. Continue Tigan  as needed for nausea.  Hypotension. Orthostatic vitals positive. Low dose Toprol  xl 25 daily as she has Afib. Monitor vitals closely.    Out of bed to chair. Incentive spirometry. Nursing supportive care. Fall, aspiration precautions. Diet:  Diet Orders (From admission, onward)     Start     Ordered   06/06/24 2138  Diet Heart Fluid consistency: Thin  Diet effective now       Question:  Fluid consistency:  Answer:  Thin   06/06/24 2137           DVT prophylaxis: Place and maintain sequential compression device Start: 06/07/24 1849 Place TED hose Start: 06/07/24 1849  Level of care: Telemetry Medical   Code Status: Full Code  Subjective: Patient is seen and examined today morning. She feels dizziness improved. Has nausea. No headache. States everyday some improvement. Eating fair. Sister at bedside.  Physical Exam: Vitals:   06/10/24 2335 06/11/24 0447 06/11/24 0748 06/11/24 1157  BP: 137/67 (!) 147/64 (!) 151/52 (!) 139/54  Pulse: 63 63 (!) 59 65  Resp: 18 18  19   Temp: 97.6 F (36.4 C) 98 F (36.7 C) 97.6 F (36.4 C) 98 F (36.7 C)  TempSrc: Oral Oral Oral Oral  SpO2: 97% 97% 97% 98%  Weight:      Height:        General - Elderly Caucasian female, no acute distress. HEENT - PERRLA, EOMI, atraumatic head, non tender sinuses. Lung - Clear, basal rales, rhonchi, no wheezes. Heart - S1, S2 heard, no murmurs, rubs, trace pedal edema. Abdomen - Soft, non tender, bowel sounds good Neuro - Alert, awake and oriented, non focal exam. EEG leads noted. Skin - Warm and dry.  Data Reviewed:      Latest Ref Rng & Units 06/11/2024    7:09 AM 06/07/2024    3:47 AM 06/06/2024    3:03 PM  CBC  WBC 4.0 - 10.5 K/uL 12.4  9.5    Hemoglobin 12.0 - 15.0 g/dL 82.9  56.2  13.0   Hematocrit 36.0 - 46.0 % 41.3  39.8  47.0    Platelets 150 - 400 K/uL 232  268        Latest Ref Rng & Units 06/11/2024    7:09 AM 06/10/2024   11:05 AM 06/09/2024    4:43 AM  BMP  Glucose 70 - 99 mg/dL 91   865   BUN 8 - 23 mg/dL 23   17   Creatinine 7.84 - 1.00 mg/dL 6.96   2.95   Sodium 284 - 145 mmol/L 144   140   Potassium 3.5 - 5.1 mmol/L 4.5  3.9  3.0   Chloride 98 - 111 mmol/L 111   106   CO2 22 - 32 mmol/L 22   25   Calcium  8.9 - 10.3 mg/dL 9.5   8.3    CT CHEST ABDOMEN PELVIS W CONTRAST Result Date: 06/09/2024 CLINICAL DATA:  Known leptomeningeal disease, evaluate for metastatic disease EXAM: CT CHEST, ABDOMEN, AND PELVIS WITH CONTRAST TECHNIQUE: Multidetector CT imaging of the chest, abdomen and pelvis was performed following the standard protocol during bolus administration of intravenous contrast. RADIATION DOSE REDUCTION: This exam was performed according to the departmental dose-optimization program which includes automated exposure control, adjustment of the mA and/or kV according to patient size and/or use of iterative reconstruction technique. CONTRAST:  75mL OMNIPAQUE  IOHEXOL  350 MG/ML SOLN COMPARISON:  None Available. FINDINGS: CT CHEST FINDINGS Cardiovascular: Atherosclerotic calcifications of the thoracic aorta are noted. No aneurysmal dilatation  or dissection is seen. No cardiac enlargement is noted. Mild coronary calcifications are seen. The pulmonary artery as visualized shows no filling defect to suggest pulmonary embolism. Timing was not performed for embolus evaluation however. Mediastinum/Nodes: Thoracic inlet is within normal limits. No hilar or mediastinal adenopathy is noted. The esophagus as visualized is within normal limits. Lungs/Pleura: Lungs are well aerated bilaterally. A 1-2 mm right upper lobe nodule is noted laterally on image number 76 of series 6. No follow-up is recommended. No other nodules are seen. Minimal basilar atelectasis is seen. Musculoskeletal: No acute rib abnormality is noted.  Degenerative changes of the thoracic spine are seen. CT ABDOMEN PELVIS FINDINGS Hepatobiliary: Gallbladder has been surgically removed. Mild biliary ductal dilatation is noted due to the post cholecystectomy state. Small cyst is noted within the left lobe of liver. Pancreas: Unremarkable. No pancreatic ductal dilatation or surrounding inflammatory changes. Spleen: Normal in size without focal abnormality. Adrenals/Urinary Tract: Adrenal glands are within normal limits. Kidneys demonstrate a normal enhancement pattern bilaterally. Multiple small simple appearing cysts are noted. No follow-up is recommended. A stone is noted in the upper pole of the left kidney measuring up to 10 mm with associated scarring identified. No ureteral stones are seen. The bladder is within normal limits. Stomach/Bowel: Scattered diverticular change of the colon is noted without evidence of diverticulitis. No obstructive or inflammatory changes of the colon are seen. The appendix is not well visualized. No inflammatory changes to suggest appendicitis are noted. Small bowel and stomach are unremarkable. Vascular/Lymphatic: Aortic atherosclerosis. No enlarged abdominal or pelvic lymph nodes. Reproductive: Status post hysterectomy. No adnexal masses. Other: Minimal free fluid is noted within the pelvis of uncertain significance. Musculoskeletal: No acute or significant osseous findings. IMPRESSION: CT of the chest: Mild bibasilar atelectasis. Less than 6 mm right solid pulmonary nodule within the upper lobe. Per Fleischner Society Guidelines, if patient is low risk for malignancy, no routine follow-up imaging is recommended. If patient is high risk for malignancy, a non-contrast Chest CT at 12 months is optional. If performed and the nodule is stable at 12 months, no further follow-up is recommended. These guidelines do not apply to immunocompromised patients and patients with cancer. Follow up in patients with significant comorbidities as  clinically warranted. For lung cancer screening, adhere to Lung-RADS guidelines. Reference: Radiology. 2017; 284(1):228-43. CT of the abdomen and pelvis: Diverticulosis without diverticulitis. Left renal stone in the upper pole measuring 10 mm with associated scarring. No other focal abnormality is noted. Electronically Signed   By: Violeta Grey M.D.   On: 06/09/2024 23:16    Family Communication: Discussed with patient, sister at bedside. They understand and agree. All questions answered.  Disposition: Status is: Inpatient Remains inpatient appropriate because: neurology work up, orthostatic. Pending LP  Planned Discharge Destination: inpatient Rehab     Time spent: 41 minutes  Author: Aisha Hove, MD 06/11/2024 3:28 PM Secure chat 7am to 7pm For on call review [email or web address].

## 2024-06-11 NOTE — Progress Notes (Signed)
 IP rehab admissions - We have approval for CIR but workup in ongoing.  Noted possible LP on Monday and may also need brain biopsy.  Not medically ready for CIR today.  Will follow progress.  [phone]

## 2024-06-11 NOTE — Progress Notes (Signed)
 Physical Therapy Treatment Patient Details Name: Gabriela Moyer MRN: [id number] DOB: Dec 17, 1941 Today's Date: 06/11/2024   History of Present Illness Pt is an 83 y/o F admitted on 06/06/24 after presenting with c/o HA x 4 days, as well as vertigo & vision changes.  MRI brain w + w /o C which demonstrated leptomeningeal/pial-arachnoid enhancement in the posterior left frontal lobe and high left parietal lobe corresponding to area of abnormality on CT. 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 PMH: chronic migraine, HTN, HLD, extensive PAD of subclavian, axillary, brachial & renal artery s/p R renal artery stent 2010, hypothyroidism, paroxysmal a-fib.    PT Comments  Pt is progressing towards goals. Pt was CGA to Min A for step without rails this session. Pt was able to perform transfers and sit to stand without an AD at supervision level. Due to pt current functional status, home set up and available assistance at home recommending skilled physical therapy services > 3 hours/day in order to address strength, balance and functional mobility to decrease risk for falls, injury, immobility, skin break down and re-hospitalization.      If plan is discharge home, recommend the following: A little help with walking and/or transfers;Assistance with cooking/housework;Assist for transportation;Help with stairs or ramp for entrance     Equipment Recommendations  Rolling walker (2 wheels);BSC/3in1       Precautions / Restrictions Precautions Precautions: Fall Precaution/Restrictions Comments: left visual field cut, orthostatic Restrictions Weight Bearing Restrictions Per Provider Order: No     Mobility  Bed Mobility Overal bed mobility: Modified Independent Bed Mobility: Supine to Sit     Supine to sit: Modified independent (Device/Increase time), HOB elevated     General bed mobility comments: Pt left in recliner at end of session    Transfers Overall transfer  level: Needs assistance Equipment used: None Transfers: Sit to/from Stand, Bed to chair/wheelchair/BSC Sit to Stand: Supervision   Step pivot transfers: Supervision       General transfer comment: supervision for sit to stand and step pivot transfers this session with assist with lines. No LOB    Ambulation/Gait         Gait velocity: deferred this session due to video monitored EEG         Stairs Stairs: Yes Stairs assistance: Min assist, Contact guard assist Stair Management: Forwards, Backwards, No rails Number of Stairs: 8 General stair comments: alternating step ups with MIn to CGA, time for resting between, no reports of dizziness or lightheadedness. HHA     Modified Rankin (Stroke Patients Only) Modified Rankin (Stroke Patients Only) Pre-Morbid Rankin Score: No symptoms Modified Rankin: Moderate disability     Balance Overall balance assessment: Needs assistance Sitting-balance support: Feet supported Sitting balance-Leahy Scale: Good     Standing balance support: Single extremity supported, During functional activity Standing balance-Leahy Scale: Fair      Hotel manager: No apparent difficulties  Cognition Arousal: Alert Behavior During Therapy: WFL for tasks assessed/performed   PT - Cognitive impairments: No apparent impairments       Following commands: Intact      Cueing Cueing Techniques: Verbal cues     General Comments General comments (skin integrity, edema, etc.): Sister present during Session BP 167/56 at end of session      Pertinent Vitals/Pain Pain Assessment Pain Assessment: No/denies pain     PT Goals (current goals can now be found in the care plan section) Acute Rehab PT Goals Patient Stated Goal:  get better PT Goal Formulation: With patient Time For Goal Achievement: 06/21/24 Potential to Achieve Goals: Good Progress towards PT goals: Progressing toward goals    Frequency    Min  3X/week      PT Plan  Continue with current POC        AM-PAC PT 6 Clicks Mobility   Outcome Measure  Help needed turning from your back to your side while in a flat bed without using bedrails?: None Help needed moving from lying on your back to sitting on the side of a flat bed without using bedrails?: None Help needed moving to and from a bed to a chair (including a wheelchair)?: A Little Help needed standing up from a chair using your arms (e.g., wheelchair or bedside chair)?: A Little Help needed to walk in hospital room?: A Little Help needed climbing 3-5 steps with a railing? : A Little 6 Click Score: 20    End of Session Equipment Utilized During Treatment: Gait belt Activity Tolerance: Patient tolerated treatment well Patient left: in chair;with call bell/phone within reach;with chair alarm set;with family/visitor present Nurse Communication: Mobility status PT Visit Diagnosis: Unsteadiness on feet (R26.81);Difficulty in walking, not elsewhere classified (R26.2)     Time: 1610-9604 PT Time Calculation (min) (ACUTE ONLY): 15 min  Charges:    $Therapeutic Activity: 8-22 mins PT General Charges $$ ACUTE PT VISIT: 1 Visit                    Sloan Duncans, DPT, CLT  Acute Rehabilitation Services Office: [phone] (Secure chat preferred)    Gabriela Moyer 06/11/2024, 11:27 AM

## 2024-06-12 ENCOUNTER — Inpatient Hospital Stay (HOSPITAL_COMMUNITY)

## 2024-06-12 DIAGNOSIS — R569 Unspecified convulsions: Secondary | ICD-10-CM | POA: Diagnosis not present

## 2024-06-12 DIAGNOSIS — I480 Paroxysmal atrial fibrillation: Secondary | ICD-10-CM | POA: Diagnosis not present

## 2024-06-12 DIAGNOSIS — I639 Cerebral infarction, unspecified: Secondary | ICD-10-CM | POA: Diagnosis not present

## 2024-06-12 DIAGNOSIS — I6350 Cerebral infarction due to unspecified occlusion or stenosis of unspecified cerebral artery: Secondary | ICD-10-CM | POA: Diagnosis not present

## 2024-06-12 DIAGNOSIS — G939 Disorder of brain, unspecified: Secondary | ICD-10-CM | POA: Diagnosis not present

## 2024-06-12 LAB — MENINGITIS/ENCEPHALITIS PANEL (CSF)

## 2024-06-12 LAB — EXTRACTABLE NUCLEAR ANTIGEN ANTIBODY
ENA SM Ab Ser-aCnc: <0.2 AI (ref 0.0–0.9)
Ribonucleic Protein: <0.2 AI (ref 0.0–0.9)
SSA (Ro) (ENA) Antibody, IgG: <0.2 AI (ref 0.0–0.9)
SSB (La) (ENA) Antibody, IgG: <0.2 AI (ref 0.0–0.9)
Scleroderma (Scl-70) (ENA) Antibody, IgG: <0.2 AI (ref 0.0–0.9)
ds DNA Ab: 4 [IU]/mL (ref 0–9)

## 2024-06-12 LAB — CRYPTOCOCCAL ANTIGEN, CSF: Crypto Ag: NEGATIVE

## 2024-06-12 LAB — CBC
HCT: 37.8 % (ref 36.0–46.0)
Hemoglobin: 12.3 g/dL (ref 12.0–15.0)
MCH: 30 pg (ref 26.0–34.0)
MCHC: 32.5 g/dL (ref 30.0–36.0)
MCV: 92.2 fL (ref 80.0–100.0)
Platelets: 193 10*3/uL (ref 150–400)
RBC: 4.1 MIL/uL (ref 3.87–5.11)
RDW: 12.4 % (ref 11.5–15.5)
WBC: 7.6 10*3/uL (ref 4.0–10.5)
nRBC: 0 % (ref 0.0–0.2)

## 2024-06-12 LAB — CSF CELL COUNT WITH DIFFERENTIAL
RBC Count, CSF: 120 /mm3 — ABNORMAL HIGH
RBC Count, CSF: 2 /mm3 — ABNORMAL HIGH
Tube #: 4
Tube #: 4
WBC, CSF: 4 /mm3 (ref 0–5)
WBC, CSF: 1 /mm3 (ref 0–5)

## 2024-06-12 LAB — PROTEIN AND GLUCOSE, CSF
Glucose, CSF: 42 mg/dL (ref 40–70)
Glucose, CSF: 45 mg/dL (ref 40–70)
Total  Protein, CSF: 44 mg/dL (ref 15–45)
Total  Protein, CSF: 39 mg/dL (ref 15–45)

## 2024-06-12 LAB — AMMONIA: Ammonia: 46 umol/L — ABNORMAL HIGH (ref 9–35)

## 2024-06-12 LAB — BASIC METABOLIC PANEL WITH GFR
Anion gap: 11 (ref 5–15)
BUN: 20 mg/dL (ref 8–23)
CO2: 26 mmol/L (ref 22–32)
Calcium: 9.5 mg/dL (ref 8.9–10.3)
Chloride: 105 mmol/L (ref 98–111)
Creatinine, Ser: 1.08 mg/dL — ABNORMAL HIGH (ref 0.44–1.00)
GFR, Estimated: 51 mL/min — ABNORMAL LOW (ref >60)
Glucose, Bld: 90 mg/dL (ref 70–99)
Potassium: 4.5 mmol/L (ref 3.5–5.1)
Sodium: 142 mmol/L (ref 135–145)

## 2024-06-12 LAB — ANTI-JO 1 ANTIBODY, IGG: Anti JO-1: <0.2 AI (ref 0.0–0.9)

## 2024-06-12 LAB — ANGIOTENSIN CONVERTING ENZYME: Angiotensin-Converting Enzyme: 25 U/L (ref 14–82)

## 2024-06-12 MED ORDER — LEVOTHYROXINE SODIUM 50 MCG PO TABS
50.0000 ug | ORAL_TABLET | Freq: Every day | ORAL | Status: DC
  Administered 2024-06-13 – 2024-06-25 (×12): 50 ug via ORAL
  Filled 2024-06-12 (×13): qty 1

## 2024-06-12 MED ORDER — GADOBUTROL 1 MMOL/ML IV SOLN: 7.0000 mL | Freq: Once | INTRAVENOUS | Status: AC | PRN

## 2024-06-12 NOTE — Progress Notes (Addendum)
 Signed consent prior to procedure placed on chart; spinal fluid tubes walked to the lab and given to [place], [employer]. Employee.

## 2024-06-12 NOTE — Progress Notes (Signed)
 NEUROLOGY CONSULT FOLLOW UP NOTE   Date of service: June 12, 2024 Patient Name: Gabriela Moyer MRN:  [id number] DOB:  August 04, 1941  Interval Hx/subjective  Worsened cognition today. She had trouble sleeping overnight.   Vitals   Vitals:   06/12/24 0014 06/12/24 0442 06/12/24 0837 06/12/24 1217  BP: 132/63 135/70 (!) 136/49 131/81  Pulse: 62 (!) 59 72 69  Resp: 14 18 18 16   Temp: 98.6 F (37 C) 97.7 F (36.5 C) (!) 97.5 F (36.4 C) 98.2 F (36.8 C)  TempSrc: Oral Oral Oral Oral  SpO2: 97% 97% 98% 98%  Weight:      Height:         Body mass index is 29.84 kg/m.  Physical Exam   Constitutional: Appears well-developed and well-nourished.  Psych: Affect is less bright today.  Eyes: No scleral injection.  HENT: No OP obstrucion.  Head: Normocephalic. EEG leads have been removed.  Respiratory: Effort normal, non-labored breathing.   Neurologic Examination    Mental Status: Awake with decreased level of alertness. Oriented to the month, Thursday, the year, city and state, with slowed responses. Speech is fluent with intact comprehension for general questions and all motor commands, but with mild dysarthria, slowed responses and significantly decreased spontaneous speech output relative to yesterday. Affect is subdued and she is mildly abulic.  Cranial Nerves: II: Left homonymous hemianopsia.   III,IV, VI: No ptosis. EOMI. No nystagmus.  VII: Smile symmetric VIII: Hearing intact to conversation IX,X: No hoarseness or hypophonia XI: Symmetric XII: No lingual dysarthria Motor: Right :  Upper extremity   5/5                                      Left:     Upper extremity   5/5             Lower extremity   5/5                                                  Lower extremity   5/5 No pronator drift Sensory: Light touch intact throughout, bilaterally. No extinction to DSS.   Cerebellar: No ataxia with FNF bilaterally Gait: Deferred  Medications  Current  Facility-Administered Medications:    acetaminophen  (TYLENOL ) tablet 650 mg, 650 mg, Oral, Q4H PRN, 650 mg at 06/09/24 0236 **OR** acetaminophen  (TYLENOL ) 160 MG/5ML solution 650 mg, 650 mg, Per Tube, Q4H PRN **OR** acetaminophen  (TYLENOL ) suppository 650 mg, 650 mg, Rectal, Q4H PRN, Sundil, Subrina, MD   aspirin  EC tablet 81 mg, 81 mg, Oral, Daily, Consuelo Denmark, MD, 81 mg at 06/12/24 0844   atorvastatin  (LIPITOR) tablet 40 mg, 40 mg, Oral, Daily, Consuelo Denmark, MD, 40 mg at 06/12/24 0844   butalbital -acetaminophen -caffeine  (FIORICET ) 50-325-40 MG per tablet 1 tablet, 1 tablet, Oral, Q6H PRN, Sundil, Subrina, MD, 1 tablet at 06/12/24 0843   diphenhydrAMINE  (BENADRYL ) capsule 50 mg, 50 mg, Oral, Q6H PRN, Pham, Minh Q, RPH-CPP   divalproex  (DEPAKOTE ) DR tablet 750 mg, 750 mg, Oral, Q12H, Shafer, Devon, NP, 750 mg at 06/12/24 0843   famotidine (PEPCID) tablet 40 mg, 40 mg, Oral, Daily, Sreeram, Narendranath, MD, 40 mg at 06/12/24 0843   levETIRAcetam  (KEPPRA ) undiluted injection 1,500 mg, 1,500 mg, Intravenous, Q12H, 1,500 mg at  06/10/24 2311 **OR** levETIRAcetam  (KEPPRA ) tablet 1,500 mg, 1,500 mg, Oral, Q12H, Shafer, Devon, NP, 1,500 mg at 06/12/24 4540   levothyroxine  (SYNTHROID ) tablet 25 mcg, 25 mcg, Oral, Daily, Sreeram, Narendranath, MD, 25 mcg at 06/12/24 0600   metoprolol  succinate (TOPROL -XL) 24 hr tablet 25 mg, 25 mg, Oral, Daily, Sreeram, Narendranath, MD, 25 mg at 06/12/24 0844   potassium chloride  SA (KLOR-CON  M) CR tablet 20 mEq, 20 mEq, Oral, Daily, Sreeram, Narendranath, MD, 20 mEq at 06/12/24 0844   senna-docusate (Senokot-S) tablet 1 tablet, 1 tablet, Oral, QHS PRN, Sundil, Subrina, MD, 1 tablet at 06/08/24 2202   sodium chloride  0.9 % bolus 500 mL, 500 mL, Intravenous, Once, Aisha Hove, MD, Held at 06/07/24 1418   sodium chloride  flush (NS) 0.9 % injection 3-10 mL, 3-10 mL, Intravenous, Q12H, Sundil, Subrina, MD, 10 mL at 06/12/24 0844   sodium chloride  flush (NS) 0.9 %  injection 3-10 mL, 3-10 mL, Intravenous, PRN, Sundil, Subrina, MD   trimethobenzamide  (TIGAN ) injection 200 mg, 200 mg, Intramuscular, Q6H PRN, Sundil, Subrina, MD  Labs and Diagnostic Imaging   CBC:  Recent Labs  Lab 06/06/24 1458 06/06/24 1503 06/11/24 0709 06/12/24 0556  WBC 11.2*   < > 12.4* 7.6  NEUTROABS 8.9*  --   --   --   HGB 15.7*   < > 13.6 12.3  HCT 46.0   < > 41.3 37.8  MCV 89.0   < > 91.8 92.2  PLT 321   < > 232 193   < > = values in this interval not displayed.    Basic Metabolic Panel:  Lab Results  Component Value Date   NA 142 06/12/2024   K 4.5 06/12/2024   CO2 26 06/12/2024   GLUCOSE 90 06/12/2024   BUN 20 06/12/2024   CREATININE 1.08 (H) 06/12/2024   CALCIUM  9.5 06/12/2024   GFRNONAA 51 (L) 06/12/2024   GFRAA 71 06/21/2019   Lipid Panel:  Lab Results  Component Value Date   LDLCALC 132 (H) 06/07/2024   HgbA1c:  Lab Results  Component Value Date   HGBA1C 5.5 06/07/2024   Urine Drug Screen: No results found for: LABOPIA, COCAINSCRNUR, LABBENZ, AMPHETMU, THCU, LABBARB  Alcohol  Level     Component Value Date/Time   [facility] <15 06/06/2024 1458   INR  Lab Results  Component Value Date   INR 1.0 06/06/2024   APTT  Lab Results  Component Value Date   APTT 27 06/06/2024   Summary of imaging studies:    - MRI of the brain demonstrates multiple abnormalities including a right PCA stroke along with leptomeningeal/pial-arachnoid enhancement in the posterior left frontal lobe and high left parietal lobe corresponding to area of abnormality on CT. In addition, there is diffuse dural enhancement on the right.  The right occipital lobe infarction may be due to an infiltrative process as there is subtle sulcal enhancement at this location as well.  - MRI of the cervical, thoracic and lumbar spine demonstrates subtle leptomeningeal enhancement about the cervical cord and thoracic spinal cord most prominently at T11-T12 with leptomeningeal  enhancement of the distal cord as well - CT Chest Abdomen and Pelvis shows no evidence for primary malignancy.   Summary of pertinent labs: - Quantiferon-TB gold Plus is negative - RPR negative   Autoimmune encephalopathy panel is pan-negative:    EKG: Atrial fibrillation with rapid ventricular response;   EEGs: - Spot EEG: This study showed 2 focal seizures without clinical signs arising from right occipital  region at 0825 and 0833 lasting for about 1 minute 10 seconds each. Additionally there was evidence of cortical dysfunction in right occipital region likely secondary to underlying structural abnormality. - LTM read 6/10 demonstrated focal seizures in right oiccipital region. Depakote  500 mg BID was started at that time.  - LTM read 6/11 shows additional 3-4 seizures per hour. Increased Depakote  on 6/11 to 750 mg BID - LTM EEG report for 6/12: Focal seizure, right occipital region; background asymmetry, right<left; intermittent slow, right>left occipital region. This study showed focal seizures without clinical signs arising from the right occipital region. Seizures were noted on 06/10/2024 at 0127 and 0553, lasting for about 1 and half minute each. Additionally there was cortical dysfunction in right > left occipital region likely secondary to underlying structural abnormality.  - LTM EEG report on 6/13 demonstrated improvement: The study showed cortical dysfunction in right > left occipital region, likely secondary to underlying structural abnormality, but with no definite seizures seen.  Assessment  TAYLAH DUBIEL is an 83 y.o. female with who presented with her typical migraine headache symptoms along with nausea, vertigo and vision changes. 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 She was loaded with Depakote  and Keppra  was later added. Titration of her AEDs resulted in lowering of the  frequency of the electrographic seizures and then resolution.    - Exam today is worsened since yesterday. Now with cognitive slowing and missed 1 out of 5 orientation items. Also with slow responses to motor commands, poor visual fixation and tracking, with difficulty gazing towards the left.  - LTM EEG report for this morning (Saturday): Continuous slow,  generalized and maximal occipital region. This study showed cortical dysfunction in bilateral occipital region likely secondary to underlying structural abnormality. Additionally there was moderate diffuse encephalopathy. No definite seizures were noted - Recent Labs:  - VPA level this morning is therapeutic at 70 - Ammonia level this morning (Saturday) is still elevated, but now lower at 46 relative to yesterday's value of 71. Will repeat level on Sunday AM.  - ANA Ab, IFA: negative - ACE level normal - Impression: - Differential for her lesions on MRI, with associated new onset seizures, is broad and includes CNS lymphoma, vasculitis, leptomeningeal carcinomatosis secondary to metastatic solid tumor and neurosarcoidosis.  - Bacterial meningitis is essentially off the DDx given that she is not encephalopathic, has no fever has only a mildly elevated white count and has no neck stiffness A fungal meningitis is also possible but unlikely given the lack of meningismus with intact mentation.  - DDx for her right occipital lobe stroke includes atrial fibrillation (see EKG result above) versus an infiltrative process resulting in a vasculopathy at this location (see MRI discussion above).  - We feel that she may benefit from a biopsy of her left parietofrontal enhancing lesion as seen on MRI    - Paroxysmal afibrillation not on anticoagulation due to history of GI bleed    Recommendations  - Discontinuing LTM EEG. - Getting a repeat MRI brain with and without contrast - Neurosurgery planning for repeat LP  - Repeat ammonia level ordered for Sunday  morning. - Lyme PCR pending - Continue Depakote  at 750mg  BID.  - Continue scheduled Keppra  at 1500 mg BID    Addendum: - Neurosurgery has performed a successful LP with CSF sent to the lab. Previously entered lab orders reviewed and extra labs ordered (see  orders for details).  - DVT prophylaxis with SCDs  ______________________________________________________________________   Hope Ly, Saraya Tirey, MD [facility]

## 2024-06-12 NOTE — Plan of Care (Signed)
  Problem: Self-Care: Goal: Ability to participate in self-care as condition permits will improve Outcome: Progressing Goal: Verbalization of feelings and concerns over difficulty with self-care will improve Outcome: Progressing Goal: Ability to communicate needs accurately will improve Outcome: Progressing   Problem: Nutrition: Goal: Risk of aspiration will decrease Outcome: Progressing Goal: Dietary intake will improve Outcome: Progressing   Problem: Clinical Measurements: Goal: Ability to maintain clinical measurements within normal limits will improve Outcome: Progressing Goal: Will remain free from infection Outcome: Progressing Goal: Diagnostic test results will improve Outcome: Progressing Goal: Respiratory complications will improve Outcome: Progressing Goal: Cardiovascular complication will be avoided Outcome: Progressing   Problem: Activity: Goal: Risk for activity intolerance will decrease Outcome: Progressing   Problem: Elimination: Goal: Will not experience complications related to bowel motility Outcome: Progressing Goal: Will not experience complications related to urinary retention Outcome: Progressing   Problem: Safety: Goal: Ability to remain free from injury will improve Outcome: Progressing   Problem: Pain Managment: Goal: General experience of comfort will improve and/or be controlled Outcome: Progressing   Problem: Skin Integrity: Goal: Risk for impaired skin integrity will decrease Outcome: Progressing

## 2024-06-12 NOTE — Procedures (Signed)
 Patient Name: Gabriela Moyer  MRN: [id number]  Epilepsy Attending: Arleene Lack  Referring Physician/Provider: Arleene Lack, MD  Duration: 06/11/2024 1515 to 06/12/2024 1013   Patient history: 83 y.o. female with visual disturbance. EEG to evaluate for seizure.    Level of alertness: Awake, asleep   AEDs during EEG study: LEV, VPA   Technical aspects: This EEG study was done with scalp electrodes positioned according to the 10-20 International system of electrode placement. Electrical activity was reviewed with band pass filter of 1-70Hz , sensitivity of 7 uV/mm, display speed of 73mm/sec with a 60Hz  notched filter applied as appropriate. EEG data were recorded continuously and digitally stored.  Video monitoring was available and reviewed as appropriate.   Description: Sleep was characterized by vertex waves, sleep spindles (12-15Hz ), maximal frontocentral region. EEG showed near continuous generalized and maximal occipital 3 to 6 Hz theta-delta slowing, at times with triphasic morphology. Hyperventilation and photic stimulation were not performed.      ABNORMALITY - Continuous slow,  generalized and maximal occipital region  IMPRESSION: This study showed cortical dysfunction in bilateral occipital region likely secondary to underlying structural abnormality. Additionally there was moderate diffuse encephalopathy. No definite seizures were noted.   Carrie Usery O Neil Errickson

## 2024-06-12 NOTE — Plan of Care (Signed)
  Problem: Education: Goal: Knowledge of disease or condition will improve Outcome: Not Progressing   

## 2024-06-12 NOTE — Progress Notes (Signed)
LTM EEG disconnected - no skin breakdown at unhook. [facility] notified.  

## 2024-06-12 NOTE — Significant Event (Signed)
 LP completed w/ samples per Dr Ellery Guthrie. Samples sent to labs. Patient/family aware to be on bedrest until 1700. No other distress or concerns voice. Will continue to monitor.

## 2024-06-12 NOTE — Progress Notes (Signed)
Patient left unit for MRI. 

## 2024-06-12 NOTE — Progress Notes (Signed)
 PT Cancellation Note  Patient Details Name: Gabriela Moyer MRN: [id number] DOB: December 10, 1941   Cancelled Treatment:    Reason Eval/Treat Not Completed: Fatigue/lethargy limiting ability to participate. Pt very lethargic this morning. Unable to fully rouse for therapy session. Visitor in room reports MD plans to order repeat MRI.   Guadelupe Leech 06/12/2024, 11:48 AM

## 2024-06-12 NOTE — Progress Notes (Signed)
 Patient returned from MRI, TELE reapplied.

## 2024-06-12 NOTE — Progress Notes (Signed)
 PROGRESS NOTE    Gabriela Moyer  ZOX:[id number] DOB: 05/01/1941 DOA: 06/06/2024 PCP: [name], [name], MD  Chief Complaint  Patient presents with   Migraine    Brief Narrative:   Gabriela Moyer is Gabriela Moyer 83 y.o. female with medical history significant of chronic migraine, essential hypertension, hyperlipidemia, extensive  PAD of subclavian, axillary, brachial and renal artery status post right renal artery stent 2010, hypothyroidism and paroxysmal afibrillation not on Xarelto  due to history of GI bleed recent emergency department complaining of headache for 4 days which is right-sided with associated vertigo, some vision change with the onset of the headache.    EEG with focal seizures.  MRI with leptomeningeal/pial-arachnoid enhancement in posterior L frontal lobe and high left parietal lobe.  Workup ongoing with assistance of neurology.  Assessment & Plan:   Principal Problem:   Ischemic cerebrovascular accident (CVA) (HCC) Active Problems:   Headache, right-sided blurry vision and dizziness   Migraine headache   AKI (acute kidney injury) (HCC)   Essential hypertension   Hyperlipidemia   History of migraine   PVD (peripheral vascular disease) (HCC)   History of stent insertion of renal artery   History of paroxysmal atrial tachycardia   History of GI bleed  Acute CVA Leptomeinigeal/pial-arachnoid enhancement in the posterior L frontal lobe and high left parietal lobe Focal seizures  Presented with migraine headache, nausea, vertigo, right visual field cut. MRI brain with leptomeningeal/pial-arachnoid enhancement in the posterior L frontal lobe and high left parietal lobe corresponding to an area of abnormality on CT - additional region of infarct in the R occipital lobe with associated leptomeningeal/pial arachnoid enhancement C spine MRI with subtle leptomeningeal enhancement about the cervical cord (see report).  T spine MR with Subtle intermittent leptomeningeal enhancement  about the thoracic cord (see report).  L spine with subtle leptomeningeal enhancement about the distal cord.   Differential include CNS lymphoma, vasculitis, leptomeningeal carcinomatosis secondary to metastatic solid tumor, CNS TB. Recent EEG without definite seizures noted, continuous EEG now d/c'd Neurology recommended Keppra  with 1500bid, Depakote  750 q12.   Repeat LP by neurosurgery successful, will follow pending LP labs Lyme PCR (not collected, will reorder).  Pending paraneoplastic ab, ENA, anti-jo 1 pending.  AFB smear/culture pending.   Negative quant gold, negative RF, negative aldolase, ANCA negative, CRP, C3/C4 wnl, Scl-70 negative, anti CCP wnl, sed rate wnl,  CT chest/ abdomen/ pelvis showed 6mm right lung nodule need outpatient follow up, no malignancy detected. Appreciate further neurology recs PT/ OT advised CIR, awaiting neurology work up.  Acute Metabolic Encephalopathy Related to above, Gabriela Moyer&Ox3 today, but not at baseline per discussion with Gabriela and neurology (typically brighter, lethargic today) Suspect related to delirium related to above Delirium precautions Workup additionally as needed  Hypokalemia: K improved. Oral potassium creased to 20 daily.   Hypothyroidism: TSH high, suggesting synthroid  underdosed. 25 mcg is low dose, not clear why (1.6 micrograms/kg dose would be ~118 - on brief chart review, seems like she's been on this dose for years) Will bump to 50 mcg and follow in 6 weeks or so  Free T4 elevated, free T3 wnl   Acute migraine headache with associated nausea Continue Fioricet  as needed. Tigan  for nausea.    Paroxysmal Afib RVR resolved Decreased home dose Toprol  to xl 25 daily. Not on anticoagulation due to gi bleed. Continue telemetry monitoring.   QT prolongation: Avoid QT prolonging drugs. Continue Tigan  as needed for nausea.   Hypotension. Orthostatic vitals positive. Low dose  Toprol  xl 25 daily as she has Afib. Monitor vitals  closely.    DVT prophylaxis: SCD Code Status: full Family Communication: none Disposition:   Status is: Inpatient Remains inpatient appropriate because: need for inpatient care   Consultants:  IR Neurology neurosurgery  Procedures:  LP by nsgy 6/14 LP by IR 6/10 LP by neurology 6/10  EEG/continuous EEG  Antimicrobials:  Anti-infectives (From admission, onward)    None       Subjective: No complaints Gabriela notes she's been confused today (neurology notes this is certainly different)   Objective: Vitals:   06/12/24 0442 06/12/24 0837 06/12/24 1217 06/12/24 1508  BP: 135/70 (!) 136/49 131/81 129/65  Pulse: (!) 59 72 69 73  Resp: 18 18 16    Temp: 97.7 F (36.5 C) (!) 97.5 F (36.4 C) 98.2 F (36.8 C) 98.3 F (36.8 C)  TempSrc: Oral Oral Oral Oral  SpO2: 97% 98% 98% 98%  Weight:      Height:        Intake/Output Summary (Last 24 hours) at 06/12/2024 1528 Last data filed at 06/12/2024 1030 Gross per 24 hour  Intake 610 ml  Output --  Net 610 ml   Filed Weights   06/06/24 1431  Weight: 74 kg    Examination:  General exam: Appears calm and comfortable  Respiratory system: unlabored Cardiovascular system: RRR Gastrointestinal system: Abdomen is nondistended, soft and nontender.  Central nervous system: Alert and oriented x3, but lethargic - CN 2-12 intact, 5/5 strength, difficulty with FNF bilaterally.  Intact heel to shin.  Extremities: no LEE   Data Reviewed: I have personally reviewed following labs and imaging studies  CBC: Recent Labs  Lab 06/06/24 1458 06/06/24 1503 06/07/24 0347 06/11/24 0709 06/12/24 0556  WBC 11.2*  --  9.5 12.4* 7.6  NEUTROABS 8.9*  --   --   --   --   HGB 15.7* 16.0* 13.6 13.6 12.3  HCT 46.0 47.0* 39.8 41.3 37.8  MCV 89.0  --  90.2 91.8 92.2  PLT 321  --  268 232 193    Basic Metabolic Panel: Recent Labs  Lab 06/06/24 1458 06/06/24 1503 06/07/24 0347 06/09/24 0443 06/10/24 1105 06/11/24 0709  06/12/24 0556  NA 138 138 137 140  --  144 142  K 3.3* 3.3* 3.3* 3.0* 3.9 4.5 4.5  CL 96* 96* 101 106  --  111 105  CO2 29  --  23 25  --  22 26  GLUCOSE 129* 125* 96 103*  --  91 90  BUN 22 26* 19 17  --  23 20  CREATININE 1.08* 1.10* 0.90 0.97  --  1.16* 1.08*  CALCIUM  10.1  --  9.2 8.3*  --  9.5 9.5    GFR: Estimated Creatinine Clearance: 36.5 mL/min (Gabriela Moyer) (by C-G formula based on SCr of 1.08 mg/dL (H)).  Liver Function Tests: Recent Labs  Lab 06/06/24 1458 06/07/24 0347  AST 22 21  ALT 13 10  ALKPHOS 58 49  BILITOT 1.1 1.0  PROT 7.1 5.9*  ALBUMIN 3.7 3.1*    CBG: Recent Labs  Lab 06/06/24 1501  GLUCAP 122*     Recent Results (from the past 240 hours)  CSF culture w Gram Stain     Status: None (Preliminary result)   Collection Time: 06/12/24  1:10 PM   Specimen: CSF; Cerebrospinal Fluid  Result Value Ref Range Status   Specimen Description CSF  Final   Special Requests NONE  Final  Gram Stain   Final    WBC SEEN NO ORGANISMS SEEN CYTOSPIN SMEAR Performed at [facility], [address] [place], [place] [address]    Culture PENDING  Incomplete   Report Status PENDING  Incomplete         Radiology Studies: No results found.      Scheduled Meds:  aspirin  EC  81 mg Oral Daily   atorvastatin   40 mg Oral Daily   divalproex   750 mg Oral Q12H   famotidine  40 mg Oral Daily   levETIRAcetam   1,500 mg Intravenous Q12H   Or   levETIRAcetam   1,500 mg Oral Q12H   levothyroxine   25 mcg Oral Daily   metoprolol  succinate  25 mg Oral Daily   potassium chloride   20 mEq Oral Daily   sodium chloride  flush  3-10 mL Intravenous Q12H   Continuous Infusions:  sodium chloride  Stopped (06/07/24 1418)     LOS: 6 days    Time spent: over 30 min     Gabriela Gains, MD [facility]   To contact the attending provider between 7A-7P or the covering provider during after hours 7P-7A, please log into the web site [email or web address] and access using  universal [place] password for that web site. If you do not have the password, please call the hospital operator.  06/12/2024, 3:28 PM

## 2024-06-12 NOTE — Progress Notes (Addendum)
 William from laboratory called to reorder cellcount, meningitis, encephalotis (sp) orders. MD ordered, confirmed with Sammie Crigler that all labs orders received with sample received.

## 2024-06-12 NOTE — Procedures (Signed)
 The patient is an 83 year old individual who has had progressive deterioration in her mental status and is stuporous today.  She has been seen and evaluated by neurology and has had attempts at fluoroscopy guided lumbar punctures which have been unsuccessful.  Because of the continued deterioration a lumbar puncture is being performed at the bedside.  Operative diagnosis: Stupor Post operative diagnosis: Same Procedure: Lumbar puncture Surgeon: Elna Haggis Anesthesia: 1 cc lidocaine  1% subcutaneously Indications: See above Procedure: With the patient in the seated position leaning over a bedside table padded on a pillow the back was prepped with Betadine scrub.  The midline of the back was palpated at the level of the iliac crests after a dry sterile field was created.  The chosen area was then infiltrated with 1 cc of lidocaine  without epinephrine .  A 20-gauge spinal needle was placed into the interlaminar space at the L2-L3 level and a single pass.  The initial fluid from the tip of the needle had slight blood tingeing.  Opening pressure was checked at 15 cm of water.  Then I drained 32 cc of cerebrospinal fluid.  Last tube will be sent for cells glucose and protein and routine culture.  The additional fluid can be used for any additional testing including cytology per neurologist recommendations.  Needle was withdrawn at the end of the case.  The patient tolerated the procedure well.

## 2024-06-13 ENCOUNTER — Inpatient Hospital Stay (HOSPITAL_COMMUNITY)

## 2024-06-13 DIAGNOSIS — I639 Cerebral infarction, unspecified: Secondary | ICD-10-CM | POA: Diagnosis not present

## 2024-06-13 DIAGNOSIS — I6350 Cerebral infarction due to unspecified occlusion or stenosis of unspecified cerebral artery: Secondary | ICD-10-CM | POA: Diagnosis not present

## 2024-06-13 DIAGNOSIS — G939 Disorder of brain, unspecified: Secondary | ICD-10-CM | POA: Diagnosis not present

## 2024-06-13 DIAGNOSIS — G9340 Encephalopathy, unspecified: Secondary | ICD-10-CM

## 2024-06-13 DIAGNOSIS — R569 Unspecified convulsions: Secondary | ICD-10-CM | POA: Diagnosis not present

## 2024-06-13 LAB — URINALYSIS, ROUTINE W REFLEX MICROSCOPIC
Bilirubin Urine: NEGATIVE
Glucose, UA: NEGATIVE mg/dL
Ketones, ur: 5 mg/dL — AB
Nitrite: NEGATIVE
Protein, ur: NEGATIVE mg/dL
Specific Gravity, Urine: 1.017 (ref 1.005–1.030)
WBC, UA: >50 WBC/hpf (ref 0–5)
pH: 6 (ref 5.0–8.0)

## 2024-06-13 LAB — BASIC METABOLIC PANEL WITH GFR
Anion gap: 13 (ref 5–15)
BUN: 17 mg/dL (ref 8–23)
CO2: 25 mmol/L (ref 22–32)
Calcium: 9.6 mg/dL (ref 8.9–10.3)
Chloride: 100 mmol/L (ref 98–111)
Creatinine, Ser: 0.98 mg/dL (ref 0.44–1.00)
GFR, Estimated: 57 mL/min — ABNORMAL LOW (ref >60)
Glucose, Bld: 98 mg/dL (ref 70–99)
Potassium: 4.2 mmol/L (ref 3.5–5.1)
Sodium: 138 mmol/L (ref 135–145)

## 2024-06-13 LAB — CBC
HCT: 43.6 % (ref 36.0–46.0)
Hemoglobin: 14.7 g/dL (ref 12.0–15.0)
MCH: 30.2 pg (ref 26.0–34.0)
MCHC: 33.7 g/dL (ref 30.0–36.0)
MCV: 89.7 fL (ref 80.0–100.0)
Platelets: 144 10*3/uL — ABNORMAL LOW (ref 150–400)
RBC: 4.86 MIL/uL (ref 3.87–5.11)
RDW: 12 % (ref 11.5–15.5)
WBC: 9.6 10*3/uL (ref 4.0–10.5)
nRBC: 0 % (ref 0.0–0.2)

## 2024-06-13 LAB — BLOOD GAS, VENOUS
Acid-Base Excess: 9.9 mmol/L — ABNORMAL HIGH (ref 0.0–2.0)
Bicarbonate: 34.3 mmol/L — ABNORMAL HIGH (ref 20.0–28.0)
O2 Saturation: 76.1 %
Patient temperature: 36.6
pCO2, Ven: 43 mmHg — ABNORMAL LOW (ref 44–60)
pH, Ven: 7.51 — ABNORMAL HIGH (ref 7.25–7.43)
pO2, Ven: 40 mmHg (ref 32–45)

## 2024-06-13 LAB — VITAMIN B12: Vitamin B-12: 316 pg/mL (ref 180–914)

## 2024-06-13 LAB — GLUCOSE, CAPILLARY
Glucose-Capillary: 78 mg/dL (ref 70–99)
Glucose-Capillary: 90 mg/dL (ref 70–99)

## 2024-06-13 LAB — FOLATE: Folate: 9.1 ng/mL (ref >5.9)

## 2024-06-13 LAB — AMMONIA: Ammonia: 32 umol/L (ref 9–35)

## 2024-06-13 MED ORDER — SODIUM CHLORIDE 0.9 % IV SOLN
1.0000 g | INTRAVENOUS | Status: AC
  Administered 2024-06-13 – 2024-06-15 (×3): 1 g via INTRAVENOUS
  Filled 2024-06-13 (×3): qty 10

## 2024-06-13 NOTE — Progress Notes (Signed)
 Eeg complete, results pending

## 2024-06-13 NOTE — Significant Event (Signed)
 Patient noted with increase somnolent, unable to follow some command, less engaging this morning. VSS.  MD paged. Neurology and neurosurgery at bedside.

## 2024-06-13 NOTE — Plan of Care (Signed)
  Problem: Clinical Measurements: Goal: Ability to maintain clinical measurements within normal limits will improve Outcome: Progressing Goal: Will remain free from infection Outcome: Progressing Goal: Diagnostic test results will improve Outcome: Progressing Goal: Respiratory complications will improve Outcome: Progressing Goal: Cardiovascular complication will be avoided Outcome: Progressing   Problem: Elimination: Goal: Will not experience complications related to bowel motility Outcome: Progressing Goal: Will not experience complications related to urinary retention Outcome: Progressing   Problem: Safety: Goal: Ability to remain free from injury will improve Outcome: Progressing   Problem: Skin Integrity: Goal: Risk for impaired skin integrity will decrease Outcome: Progressing   

## 2024-06-13 NOTE — Progress Notes (Addendum)
 NEUROLOGY CONSULT FOLLOW UP NOTE   Date of service: June 13, 2024 Patient Name: Gabriela Moyer MRN:  [id number] DOB:  02-Dec-1941  Interval Hx/subjective  Further worsening overnight. Patient required repetitive stimulation in order to respond, per RN note. Sister confirms that the patient is significantly less alert and interactive than yesterday.   Vitals   Vitals:   06/13/24 0400 06/13/24 0804 06/13/24 1141 06/13/24 1521  BP: 110/60 132/65 (!) 124/55 (!) 111/55  Pulse: 83 73 71 68  Resp: 16 16 16 16   Temp: 98 F (36.7 C) 97.8 F (36.6 C) 97.9 F (36.6 C) 98.6 F (37 C)  TempSrc: Oral Oral Oral Oral  SpO2: 97% 97% 98% 100%  Weight:      Height:         Body mass index is 29.84 kg/m.  Physical Exam   Constitutional: Appears well-developed and well-nourished.  Eyes: No scleral injection.  HENT: No OP obstrucion.  Head: Normocephalic. EEG leads have been removed.  Respiratory: Effort normal, non-labored breathing.  Neurologic Examination   Mental Status: Lethargic with eyes open. Requires repeated questions and calling of her name to respond. Oriented to her name and the hospital, otherwise unable to answer orientation questions correctly or at all. Several of her attempts to answer questions consist of the same perseverative response. Speech is slow, low volume and slurred. Can follow simple commands with increased latencies of responses.  Cranial Nerves: II: Left homonymous hemianopsia.   III,IV, VI: Poor visual fixation today. Eyes are conjugate and near the midline. Has difficulty tracking to the left. No nystagmus.  VII: Smile symmetric VIII: Hearing intact to some questions and commands.  IX,X: Hypophonic speech XI: Symmetric Motor: Moves all 4 extremities with decreased effort and increased latencies of motor responses today. .  Sensory: Increased latencies of responses to pinch stimuli.   Cerebellar: No gross ataxia Gait: Deferred  Medications  Current  Facility-Administered Medications:    acetaminophen  (TYLENOL ) tablet 650 mg, 650 mg, Oral, Q4H PRN, 650 mg at 06/09/24 0236 **OR** acetaminophen  (TYLENOL ) 160 MG/5ML solution 650 mg, 650 mg, Per Tube, Q4H PRN **OR** acetaminophen  (TYLENOL ) suppository 650 mg, 650 mg, Rectal, Q4H PRN, Sundil, Subrina, MD   aspirin  EC tablet 81 mg, 81 mg, Oral, Daily, Consuelo Denmark, MD, 81 mg at 06/13/24 4098   atorvastatin  (LIPITOR) tablet 40 mg, 40 mg, Oral, Daily, Consuelo Denmark, MD, 40 mg at 06/13/24 1191   butalbital -acetaminophen -caffeine  (FIORICET ) 50-325-40 MG per tablet 1 tablet, 1 tablet, Oral, Q6H PRN, Sundil, Subrina, MD, 1 tablet at 06/13/24 4782   diphenhydrAMINE  (BENADRYL ) capsule 50 mg, 50 mg, Oral, Q6H PRN, Pham, Minh Q, RPH-CPP   divalproex  (DEPAKOTE ) DR tablet 750 mg, 750 mg, Oral, Q12H, Shafer, Devon, NP, 750 mg at 06/13/24 0937   famotidine (PEPCID) tablet 40 mg, 40 mg, Oral, Daily, Sreeram, Narendranath, MD, 40 mg at 06/13/24 9562   levETIRAcetam  (KEPPRA ) undiluted injection 1,500 mg, 1,500 mg, Intravenous, Q12H, 1,500 mg at 06/12/24 2050 **OR** levETIRAcetam  (KEPPRA ) tablet 1,500 mg, 1,500 mg, Oral, Q12H, Shafer, Devon, NP, 1,500 mg at 06/13/24 1308   levothyroxine  (SYNTHROID ) tablet 50 mcg, 50 mcg, Oral, Daily, Etter Hermann., MD, 50 mcg at 06/13/24 6578   metoprolol  succinate (TOPROL -XL) 24 hr tablet 25 mg, 25 mg, Oral, Daily, Sreeram, Narendranath, MD, 25 mg at 06/13/24 4696   potassium chloride  SA (KLOR-CON  M) CR tablet 20 mEq, 20 mEq, Oral, Daily, Sreeram, Narendranath, MD, 20 mEq at 06/13/24 0937   senna-docusate (Senokot-S) tablet  1 tablet, 1 tablet, Oral, QHS PRN, Sundil, Subrina, MD, 1 tablet at 06/08/24 2202   sodium chloride  0.9 % bolus 500 mL, 500 mL, Intravenous, Once, Aisha Hove, MD, Held at 06/07/24 1418   sodium chloride  flush (NS) 0.9 % injection 3-10 mL, 3-10 mL, Intravenous, Q12H, Sundil, Subrina, MD, 10 mL at 06/13/24 4098   sodium chloride  flush (NS) 0.9 %  injection 3-10 mL, 3-10 mL, Intravenous, PRN, Sundil, Subrina, MD   trimethobenzamide  (TIGAN ) injection 200 mg, 200 mg, Intramuscular, Q6H PRN, Sundil, Subrina, MD  Labs and Diagnostic Imaging   CBC:  Recent Labs  Lab 06/12/24 0556 06/13/24 1151  WBC 7.6 9.6  HGB 12.3 14.7  HCT 37.8 43.6  MCV 92.2 89.7  PLT 193 144*    Basic Metabolic Panel:  Lab Results  Component Value Date   NA 138 06/13/2024   K 4.2 06/13/2024   CO2 25 06/13/2024   GLUCOSE 98 06/13/2024   BUN 17 06/13/2024   CREATININE 0.98 06/13/2024   CALCIUM  9.6 06/13/2024   GFRNONAA 57 (L) 06/13/2024   GFRAA 71 06/21/2019   Lipid Panel:  Lab Results  Component Value Date   LDLCALC 132 (H) 06/07/2024   HgbA1c:  Lab Results  Component Value Date   HGBA1C 5.5 06/07/2024   Urine Drug Screen: No results found for: LABOPIA, COCAINSCRNUR, LABBENZ, AMPHETMU, THCU, LABBARB  Alcohol  Level     Component Value Date/Time   [facility] <15 06/06/2024 1458   INR  Lab Results  Component Value Date   INR 1.0 06/06/2024   APTT  Lab Results  Component Value Date   APTT 27 06/06/2024   Summary of imaging studies:    - MRI of the brain demonstrates multiple abnormalities including a right PCA stroke along with leptomeningeal/pial-arachnoid enhancement in the posterior left frontal lobe and high left parietal lobe corresponding to area of abnormality on CT. In addition, there is diffuse dural enhancement on the right.  The right occipital lobe infarction may be due to an infiltrative process as there is subtle sulcal enhancement at this location as well.  - MRI of the cervical, thoracic and lumbar spine demonstrates subtle leptomeningeal enhancement about the cervical cord and thoracic spinal cord most prominently at T11-T12 with leptomeningeal enhancement of the distal cord as well - CT Chest Abdomen and Pelvis shows no evidence for primary malignancy.   Summary of pertinent labs: - Quantiferon-TB gold Plus is  negative - RPR negative   Autoimmune encephalopathy panel is pan-negative:    EKG: Atrial fibrillation with rapid ventricular response;   EEGs: - Spot EEG: This study showed 2 focal seizures without clinical signs arising from right occipital region at 0825 and 0833 lasting for about 1 minute 10 seconds each. Additionally there was evidence of cortical dysfunction in right occipital region likely secondary to underlying structural abnormality. - LTM read 6/10 demonstrated focal seizures in right oiccipital region. Depakote  500 mg BID was started at that time.  - LTM read 6/11 shows additional 3-4 seizures per hour. Increased Depakote  on 6/11 to 750 mg BID - LTM EEG report for 6/12: Focal seizure, right occipital region; background asymmetry, right<left; intermittent slow, right>left occipital region. This study showed focal seizures without clinical signs arising from the right occipital region. Seizures were noted on 06/10/2024 at 0127 and 0553, lasting for about 1 and half minute each. Additionally there was cortical dysfunction in right > left occipital region likely secondary to underlying structural abnormality.  - LTM EEG report on  6/13 demonstrated improvement: The study showed cortical dysfunction in right > left occipital region, likely secondary to underlying structural abnormality, but with no definite seizures seen. - LTM EEG report on 6/14: Continuous slow,  generalized and maximal occipital region. This study showed cortical dysfunction in bilateral occipital region likely secondary to underlying structural abnormality. Additionally there was moderate diffuse encephalopathy. No definite seizures were noted      Assessment  HENLEY BLYTH is an 83 y.o. female with who presented with her typical migraine headache symptoms along with nausea, vertigo and vision changes. 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 She was loaded with Depakote  and Keppra  was later added. Titration of her AEDs resulted in lowering of the frequency of the electrographic seizures and then resolution.    - Exam today shows continued worsening. Worsened cognitive slowing and disorientation. Also with slow responses to motor commands, poor visual fixation and tracking, with difficulty gazing towards the left.  - LTM EEG report for this morning (Saturday): Continuous slow,  generalized and maximal occipital region. This study showed cortical dysfunction in bilateral occipital region likely secondary to underlying structural abnormality. Additionally there was moderate diffuse encephalopathy. 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 No associated hemorrhage or significant regional mass effect. No significant interval change in appearance of leptomeningeal enhancement within the high posterior left frontoparietal region. Again, this finding is nonspecific, with primary differential considerations including changes of meningitis, leptomeningeal carcinomatosis, sarcoid, or possibly lymphoma.  Subtly increased patchy FLAIR signal abnormality with a few foci of enhancement involving the left occipital lobe, new as compared to recent MRI from 06/06/2024. Findings are nonspecific, but could be related to recent seizure given provided history. Superimposed changes of PRES would be the primary differential consideration, although BP has been well-controlled.  - Recent Labs:  - VPA level on 6/13 therapeutic at 70 - Ammonia level this morning (Sunday) is now normalized.   - ANA Ab, IFA: negative - ACE level normal - TSH elevated - Folate normal - B12 low at 316 - CRP normal - Lyme disease PCR pending - Results of LP performed yesterday by  Neurosurgery:  - Clear, colorless - Glucose 42 (blood glucose of 90 seven hours earlier suggests increased glucose consumption within the CSF compartment).  - WBC 1 in tube 4 - Protein normal at 44 - Meningitis/encephalitis PCR panel negative - TB NAA test: Pending - Culture pending - Gram stain: No organisms seen - Acid fast culture and smear: Pending - CSF cryptococcal Ag: Negative - Impression: - Differential for her lesions on MRI, with associated new onset seizures, is broad and includes CNS lymphoma, vasculitis, leptomeningeal carcinomatosis secondary to metastatic solid tumor and neurosarcoidosis.  - Bacterial meningitis is essentially off the DDx given that she is not encephalopathic, has no fever has only a mildly elevated white count and has no neck stiffness A fungal meningitis is also possible but unlikely given the lack of meningismus with intact mentation.  - DDx for her right occipital lobe stroke includes atrial fibrillation (see EKG result above) versus an infiltrative process resulting in a vasculopathy at this location (see MRI discussion above).  - We feel that she may benefit from a biopsy of her left parietofrontal enhancing lesion as seen on MRI               -  Paroxysmal atrial fibrillation, not on anticoagulation due to history of GI bleed   Recommendations  - Spot EEG.  - Synthroid  dosing per primary team - B12 supplementation - Continue Depakote  at 750mg  BID.  - Continue scheduled Keppra  at 1500 mg BID   - Not on anticoagulation for her atrial fibrillation due to GI bleed - BP management.    Addendum: - Spot EEG: This study showed generalized periodic discharges with triphasic morphology which could be secondary to toxic-metabolic causes. However, prior EEG did show seizures and the frequency reaches upto 2 Hz which can be on the ictal-interictal continuum. Therefore, recommend long term eeg for further characterization. Additionally there was moderate diffuse  encephalopathy. No definite seizures were noted. - Will re-place EEG leads for additional LTM EEG monitoring. ______________________________________________________________________   Gabriela Moyer, Naftoli Penny, MD [facility]

## 2024-06-13 NOTE — Progress Notes (Signed)
 LTM EEG hooked up and running - no initial skin breakdown - push button tested - [facility] monitoring.  Summer assisted

## 2024-06-13 NOTE — Evaluation (Signed)
 Clinical/Bedside Swallow Evaluation Patient Details  Name: Gabriela Moyer MRN: [id number] Date of Birth: 1941-06-28  Today's Date: 06/13/2024 Time: SLP Start Time (ACUTE ONLY): 1527 SLP Stop Time (ACUTE ONLY): 1542 SLP Time Calculation (min) (ACUTE ONLY): 15 min  Past Medical History:  Past Medical History:  Diagnosis Date   Carotid arterial disease (HCC)    a. 08/2016 Carotid U/S: 1-39% bilat ICA stenosis, >50 R ECA stenosis.   Diastolic dysfunction    a. 11/2016 Echo: EF 65-70%, Gr1 DD.   History of stress test    a. 12/2016 Lexiscan  MV: EF 73%, no ischemia/infarct.   Hyperlipidemia    Hypertension    PAF (paroxysmal atrial fibrillation) (HCC)    a. 11/2016 AF RVR in ED-->converted on IV dilt-->CHA2DS2VASc = 4-->Xarelto .   Renal artery stenosis (HCC)    a. 06/2009 s/p R Renal Artery PTA/stenting; b. 08/2016 Renal Duplex: nl LRA, >60% RRA stenosis- f/u 1 yr.   Subclavian arterial stenosis (HCC)    a. 08/2014 Upper Ext Duplex: R distal [place] & Ax - 70-99% diam reduction, L distal [place] & Ax - 50-69%--stable.   Past Surgical History:  Past Surgical History:  Procedure Laterality Date   CARDIAC CATHETERIZATION  11/03/2007   atherosclerotic right renal artery (Dr. Jammie Mccune)   Carotid Doppler  12/2010   R & L ICA with small amount fibrous plaque; R distal subclav. 70-99% diameter reduction; L mid subclav 50-69% diameter reduction   Lower Extremity Arterial Doppler  03/2008   right ABI - mild arterial insuff at rest; R CIA with less than 50% diameter reduction, R & L SFA with less than 50% diameter reduction   NM MYOCAR PERF WALL MOTION  09/2009   bruce myoview  - normal perfusion, EF 915, low risk scan   RENAL ANGIOPLASTY  11/07/2009   5x69mm Cordis [employer] Aviator stent to ostium of right renal artery (Dr. Aleda Ammon)   Renal Artery Doppler  04/2012   celiac artery with >50% diameter reduction; R renal artery stent w/mildly elevated velocities (1-59% diameter reduction)   TRANSTHORACIC  ECHOCARDIOGRAM  08/28/2009   EF=>55%; trace TR; mild AV regurg, mild pulm valve regurg   Upper Extremity Arterial Doppler  04/2012   right brachial pressure , left ; R distal subclavian & axillary arteries 70-99% diameter reduction; L distal subclavian/axillary & brachial arteries (50-69% diameter reduction)   HPI:  Gabriela Moyer is an 83 y.o. female with who presented to the hospital on 06/06/24 after four days of severe headache (has migraine headaches at baseline) as well as vertigo and vision changes. CT head: 6 mm small hyperdense area adjacent to the right lateral ventricle  concerning for small brain lesion. Low-density area in the right occipital lobe could reflect infarct or edema related to underlying lesion. MRI brain was positive for Leptomeningeal/pial-arachnoid enhancement in the posterior left frontal lobe and high left parietal lobe with findings most concerning for meningitis and possible focus of encephalitis in right occipital lobe. EEG showed status epileptius. LP conducted at bedside 6/14 due to multple failed fluoroscopy guided lumbar punctures. SLP eval 6/10: patient appears WNL for cognition, language and speech function. SLP re-consulted 6/13 due to Difficulty reading comprehension with NIHSS and difficulty describing picture with mom washing dishes and swallow eval requested due to decreased alertness and mentation. 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 stent 2010    Assessment / Plan / Recommendation  Clinical  Impression  Gabriela Moyer was seen for bedside swallow evaluation with her daughter present who reported that the Gabriela Moyer has been more lethargic since 6/13, but that today is worse than yesterday. Gabriela Moyer's alertness was suboptimal throughout the session. Gabriela Moyer's eyes were closed for the majority of the evaluation, but alertness improved some with verbal and tactile  stimulation. Oral mechanism exam was limited due to Gabriela Moyer's difficulty following commands; however, oral inspection revealed a moist oral mucosa and adequate, natural dentition. Gabriela Moyer presented with symptoms of oral phase dysphagia which are likely cognitively based. She exhibited reduced bolus awareness with oral holding across consistencies, and absent bolus manipulation/mastication of regular texture solids. An NPO status is recommended at this time with allowance of ice chips and water via spoon if Gabriela Moyer is adequately alert and oral care is provided. Critical meds may be given crushed in puree if Gabriela Moyer is adequately alert, but verbal prompting may be necessary for Gabriela Moyer to swallow. SLP will follow to assess improvement in swallow function and for speech-language-cognition evaluation when Gabriela Moyer is able to maintain an adequate level of alertness. SLP Visit Diagnosis: Dysphagia, unspecified (R13.10)    Aspiration Risk  Moderate aspiration risk    Diet Recommendation NPO except meds;Ice chips PRN after oral care;Free water protocol after oral care    Liquid Administration via: Spoon Medication Administration: Crushed with puree Supervision: Full supervision/cueing for compensatory strategies Compensations: Slow rate Postural Changes: Seated upright at 90 degrees    Other  Recommendations Oral Care Recommendations: Oral care QID;Oral care prior to ice chip/H20     Assistance Recommended at Discharge    Functional Status Assessment Patient has had a recent decline in their functional status and demonstrates the ability to make significant improvements in function in a reasonable and predictable amount of time.  Frequency and Duration min 2x/week  2 weeks       Prognosis Prognosis for improved oropharyngeal function: Good Barriers to Reach Goals: Severity of deficits      Swallow Study   General Date of Onset: 06/12/24 HPI: Gabriela Moyer is an 83 y.o. female with who presented to the hospital on 06/06/24 after four  days of severe headache (has migraine headaches at baseline) as well as vertigo and vision changes. CT head: 6 mm small hyperdense area adjacent to the right lateral ventricle  concerning for small brain lesion. Low-density area in the right occipital lobe could reflect infarct or edema related to underlying lesion. MRI brain was positive for Leptomeningeal/pial-arachnoid enhancement in the posterior left frontal lobe and high left parietal lobe with findings most concerning for meningitis and possible focus of encephalitis in right occipital lobe. EEG showed status epileptius. LP conducted at bedside 6/14 due to multple failed fluoroscopy guided lumbar punctures. SLP eval 6/10: patient appears WNL for cognition, language and speech function. SLP re-consulted 6/13 due to Difficulty reading comprehension with NIHSS and difficulty describing picture with mom washing dishes and swallow eval requested due to decreased alertness and mentation. 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 stent 2010 Type of Study: Bedside Swallow Evaluation Previous Swallow Assessment: none Diet Prior to this Study: Regular;Thin liquids (Level 0) Temperature Spikes Noted: No History of Recent Intubation: No Behavior/Cognition: Lethargic/Drowsy Oral Cavity Assessment: Within Functional Limits Oral Care Completed by SLP: No Oral Cavity - Dentition: Adequate natural dentition Self-Feeding Abilities: Total assist Patient Positioning: Upright in bed Baseline Vocal Quality: Low vocal intensity Volitional Cough: Cognitively unable  to elicit Volitional Swallow: Able to elicit    Oral/Motor/Sensory Function     Ice Chips Ice chips: Impaired Presentation: Spoon Oral Phase Impairments: Poor awareness of bolus;Reduced labial seal   Thin Liquid Thin Liquid: Impaired Presentation: Spoon;Cup;Straw Oral Phase  Impairments: Poor awareness of bolus;Reduced labial seal Oral Phase Functional Implications: Left anterior spillage;Oral holding    Nectar Thick Nectar Thick Liquid: Not tested   Honey Thick Honey Thick Liquid: Not tested   Puree Puree: Impaired Presentation: Spoon Oral Phase Impairments: Poor awareness of bolus;Reduced lingual movement/coordination Oral Phase Functional Implications: Oral holding   Solid     Solid: Impaired Oral Phase Impairments: Poor awareness of bolus     Vonna Brabson I. Valda Garnet, MS, CCC-SLP Acute Rehabilitation Services Office number [phone]  Toya Friar 06/13/2024,4:13 PM

## 2024-06-13 NOTE — Progress Notes (Signed)
 LTM maint complete - no skin breakdown seen. [facility] monitored, Event button test confirmed by [facility].

## 2024-06-13 NOTE — Procedures (Signed)
 Patient Name: Gabriela Moyer  MRN: [id number]  Epilepsy Attending: Arleene Lack  Referring Physician/Provider: Etter Hermann., MD  Date: 06/13/2024 Duration: 22.22 mins   Patient history: 83 y.o. female with visual disturbance. EEG to evaluate for seizure.    Level of alertness: comatose/ lethargic   AEDs during EEG study: LEV, VPA   Technical aspects: This EEG study was done with scalp electrodes positioned according to the 10-20 International system of electrode placement. Electrical activity was reviewed with band pass filter of 1-70Hz , sensitivity of 7 uV/mm, display speed of 39mm/sec with a 60Hz  notched filter applied as appropriate. EEG data were recorded continuously and digitally stored.  Video monitoring was available and reviewed as appropriate.   Description:  EEG showed continuous generalized 3 to 5 Hz theta-delta slowing. Generalized periodic discharges with triphasic morphology were noted at 1-2hz .  Hyperventilation and photic stimulation were not performed.      ABNORMALITY  - Periodic discharges with triphasic morphology, generalized - Continuous slow,  generalized    IMPRESSION: This study showed generalized periodic discharges with triphasic morphology which could be secondary to toxic-metabolic causes. However, prior EEG did show seizures and the frequency reaches upto 2hz  which can be on the ictal-interictal continuum. Therefore, recommend long term eeg for further characterization.  Additionally there was moderate diffuse encephalopathy. No definite seizures were noted.   Zadin Lange O Chardae Mulkern

## 2024-06-13 NOTE — Progress Notes (Signed)
 01:43H Informed on-call Dr. Brock Canner, MD regarding patient increased lethargy. Patient needs repetitive stimulation before responding. Answers yes inappropriately, closed eyes most of the time. Ordered for venous blood gas and continued patient monitoring since there was no report yet from the CT brain done yesterday. 04:57H Patient is more responsive expressed need by saying help but cannot elaborate what it is. Informed on-call MRI brain has result already.

## 2024-06-13 NOTE — Progress Notes (Addendum)
 PROGRESS NOTE    [name]  ZOX:[id number] DOB: 08/19/1941 DOA: 06/06/2024 PCP: [name], [name], MD  Chief Complaint  Patient presents with   Migraine    Brief Narrative:   Gabriela Moyer is [name] 83 y.o. female with medical history significant of chronic migraine, essential hypertension, hyperlipidemia, extensive  PAD of subclavian, axillary, brachial and renal artery status post right renal artery stent 2010, hypothyroidism and paroxysmal afibrillation not on Xarelto  due to history of GI bleed recent emergency department complaining of headache for 4 days which is right-sided with associated vertigo, some vision change with the onset of the headache.    EEG with focal seizures.  MRI with leptomeningeal/pial-arachnoid enhancement in posterior L frontal lobe and high left parietal lobe.  Workup ongoing with assistance of neurology.  Assessment & Plan:   Principal Problem:   Ischemic cerebrovascular accident (CVA) (HCC) Active Problems:   Headache, right-sided blurry vision and dizziness   Migraine headache   AKI (acute kidney injury) (HCC)   Essential hypertension   Hyperlipidemia   History of migraine   PVD (peripheral vascular disease) (HCC)   History of stent insertion of renal artery   History of paroxysmal atrial tachycardia   History of GI bleed  Goals of Care Discussed with daughter, she notes DNR c/w previous conversations.  She discussed with the patient's sister and they were both in agreement with DNR.    Acute CVA Leptomeinigeal/pial-arachnoid enhancement in the posterior L frontal lobe and high left parietal lobe Focal seizures  Presented with migraine headache, nausea, vertigo, right visual field cut. MRI brain with leptomeningeal/pial-arachnoid enhancement in the posterior L frontal lobe and high left parietal lobe corresponding to an area of abnormality on CT - additional region of infarct in the R occipital lobe with associated leptomeningeal/pial arachnoid  enhancement C spine MRI with subtle leptomeningeal enhancement about the cervical cord (see report).  T spine MR with Subtle intermittent leptomeningeal enhancement about the thoracic cord (see report).  L spine with subtle leptomeningeal enhancement about the distal cord.   Differential include CNS lymphoma, vasculitis, leptomeningeal carcinomatosis secondary to metastatic solid tumor, CNS TB. Recent EEG without definite seizures noted, continuous EEG now d/c'd Neurology recommended Keppra  with 1500bid, Depakote  750 q12.   Repeat LP by neurosurgery successful, will follow pending LP labs (nl glucose, protein.  1 WBC, 2 RBC's.  negative crypto ag, negative meningitis/encephalitis panel, MT RIF NAA w cult) Lyme PCR (not collected, will reorder).  Pending paraneoplastic ab.  AFB smear/culture pending.   Negative quant gold, negative RF, negative aldolase, ANCA negative, CRP, C3/C4 wnl, Scl-70 negative, anti CCP wnl, sed rate wnl, ENA negative, anti-jo 1 wnl CT chest/ abdomen/ pelvis showed 6mm right lung nodule need outpatient follow up, no malignancy detected. Appreciate further neurology recs PT/ OT advised CIR, awaiting neurology work up.  Acute Metabolic Encephalopathy Progressive worsening in encephalopathy - was [place]hower&Ox3 yesterday, but more lethargic (not as notable to me on my first day evaluating her, but notable to neurology/family).  Today, less able to consistently participate in exam (sitting in chair yesterday, in bed today).  MRI showed interval evolution of early subacute R PCA distribution infarct.  No significant interval vhange in appearance of leptomeningeal enhancement within the high posterior L frontoparietal region.  Subtly increased patchy flair signal abnormality with few foci of enhancement involving the L occipital lobe.   Suspect related to above process - unclear what is causing rapid worsening Delirium precautions Will repeat EEG UA with  many bacteria, + LE, negative  nitrite, 0-5 RBC.  Not convincing for UTI, will follow culture.  Treat empirically with encephalopathy.  Ammonia wnl, VBG without hypercarbia.  B12, folate.  TSH as below.   Workup additionally as needed  Hypokalemia: K improved. Oral potassium creased to 20 daily.   Hypothyroidism: TSH high, suggesting synthroid  underdosed. 25 mcg is low dose, not clear why (1.6 micrograms/kg dose would be ~118 - on brief chart review, seems like she's been on this dose for years) bumped to 50 mcg and follow in 6 weeks or so  Free T4 elevated, free T3 wnl   Acute migraine headache with associated nausea Continue Fioricet  as needed. Tigan  for nausea.    Paroxysmal Afib RVR resolved Decreased home dose Toprol  to xl 25 daily. Not on anticoagulation due to gi bleed. Continue telemetry monitoring.   QT prolongation: Avoid QT prolonging drugs. Continue Tigan  as needed for nausea.   Hypotension. Orthostatic vitals positive. Low dose Toprol  xl 25 daily as she has Afib. Monitor vitals closely.    DVT prophylaxis: SCD Code Status: full Family Communication: none Disposition:   Status is: Inpatient Remains inpatient appropriate because: need for inpatient care   Consultants:  IR Neurology neurosurgery  Procedures:  LP by nsgy 6/14 LP by IR 6/10 LP by neurology 6/10  EEG/continuous EEG  Antimicrobials:  Anti-infectives (From admission, onward)    None       Subjective: Lethargic, unable to participate in interview Daughter notes she's gotten progressively worse  Objective: Vitals:   06/13/24 0400 06/13/24 0804 06/13/24 1141 06/13/24 1521  BP: 110/60 132/65 (!) 124/55 (!) 111/55  Pulse: 83 73 71 68  Resp: 16 16 16 16   Temp: 98 F (36.7 C) 97.8 F (36.6 C) 97.9 F (36.6 C) 98.6 F (37 C)  TempSrc: Oral Oral Oral Oral  SpO2: 97% 97% 98% 100%  Weight:      Height:        Intake/Output Summary (Last 24 hours) at 06/13/2024 1721 Last data filed at 06/13/2024  1529 Gross per 24 hour  Intake 250 ml  Output 850 ml  Net -600 ml   Filed Weights   06/06/24 1431  Weight: 74 kg    Examination:  General: No acute distress. Cardiovascular: RRR Lungs: unlabored S/nt/nd Neurological: lethargic, not consistently following commands Extremities: No clubbing or cyanosis. No edema.   Data Reviewed: I have personally reviewed following labs and imaging studies  CBC: Recent Labs  Lab 06/07/24 0347 06/11/24 0709 06/12/24 0556 06/13/24 1151  WBC 9.5 12.4* 7.6 9.6  HGB 13.6 13.6 12.3 14.7  HCT 39.8 41.3 37.8 43.6  MCV 90.2 91.8 92.2 89.7  PLT 268 232 193 144*    Basic Metabolic Panel: Recent Labs  Lab 06/07/24 0347 06/09/24 0443 06/10/24 1105 06/11/24 0709 06/12/24 0556 06/13/24 1151  NA 137 140  --  144 142 138  K 3.3* 3.0* 3.9 4.5 4.5 4.2  CL 101 106  --  111 105 100  CO2 23 25  --  22 26 25   GLUCOSE 96 103*  --  91 90 98  BUN 19 17  --  23 20 17   CREATININE 0.90 0.97  --  1.16* 1.08* 0.98  CALCIUM  9.2 8.3*  --  9.5 9.5 9.6    GFR: Estimated Creatinine Clearance: 40.3 mL/min (by C-G formula based on SCr of 0.98 mg/dL).  Liver Function Tests: Recent Labs  Lab 06/07/24 0347  AST 21  ALT 10  ALKPHOS  49  BILITOT 1.0  PROT 5.9*  ALBUMIN 3.1*    CBG: Recent Labs  Lab 06/13/24 0049 06/13/24 1004  GLUCAP 78 90     Recent Results (from the past 240 hours)  CSF culture w Gram Stain     Status: None (Preliminary result)   Collection Time: 06/12/24  1:10 PM   Specimen: CSF; Cerebrospinal Fluid  Result Value Ref Range Status   Specimen Description CSF  Final   Special Requests NONE  Final   Gram Stain WBC SEEN NO ORGANISMS SEEN CYTOSPIN SMEAR   Final   Culture   Final    NO GROWTH < 24 HOURS Performed at [facility], [address], [place], [place] [address]    Report Status PENDING  Incomplete         Radiology Studies: MR BRAIN W WO CONTRAST Result Date: 06/13/2024 CLINICAL DATA:  Follow-up  examination four meningeal abnormality. EXAM: MRI HEAD WITHOUT AND WITH CONTRAST TECHNIQUE: Multiplanar, multiecho pulse sequences of the brain and surrounding structures were obtained without and with intravenous contrast. 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 No significant associated hemorrhage. Associated patchy post-contrast enhancement again seen within this region, now more parenchymal in appearance as compared to prior, likely related to an evolving infarct. No other new or interval areas of infarction. Additionally, there is patchy FLAIR signal intensity within the left occipital lobe, increased from prior (series 6, image 17). No acute 5 heights suspected subtle patchy post-contrast enhancement (series 10, image 44). Findings are nonspecific. Area of leptomeningeal enhancement within the high posterior left frontoparietal region again seen, not significantly changed as compared to previous. No other new areas of abnormal enhancement. No other mass lesion, mass effect, or midline shift. No hydrocephalus or extra-axial fluid collection. Pituitary gland suprasellar region within normal limits. No other abnormal enhancement. Vascular: Major intracranial vascular flow voids are maintained. Skull and upper cervical spine: Craniocervical junction within normal limits. Bone marrow signal intensity normal. No significant scalp soft tissue abnormality. Sinuses/Orbits: Globes and orbital soft tissues within normal limits. Paranasal sinuses are largely clear. No mastoid effusion. Other: None. IMPRESSION: 1. 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 No associated hemorrhage or significant regional  mass effect. 2. No significant interval change in appearance of leptomeningeal enhancement within the high posterior left frontoparietal region. Again, this finding is nonspecific, with primary differential considerations including changes of meningitis, leptomeningeal carcinomatosis, sarcoid, or possibly lymphoma. 3. Subtly increased patchy FLAIR signal abnormality with Amariana Mirando few foci of enhancement involving the left occipital lobe, new as compared to recent MRI from 06/06/2024. Findings are nonspecific, but could be related to recent seizure given provided history. Superimposed changes of PRES would be the primary differential consideration. Electronically Signed   By: Virgia Griffins M.D.   On: 06/13/2024 04:10        Scheduled Meds:  aspirin  EC  81 mg Oral Daily   atorvastatin   40 mg Oral Daily   divalproex   750 mg Oral Q12H   famotidine  40 mg Oral Daily   levETIRAcetam   1,500 mg Intravenous Q12H   Or   levETIRAcetam   1,500 mg Oral Q12H   levothyroxine   50 mcg Oral Daily   metoprolol  succinate  25  mg Oral Daily   potassium chloride   20 mEq Oral Daily   sodium chloride  flush  3-10 mL Intravenous Q12H   Continuous Infusions:  sodium chloride  Stopped (06/07/24 1418)     LOS: 7 days    Time spent: over 30 min     Donnetta Gains, MD [facility]   To contact the attending provider between 7A-7P or the covering provider during after hours 7P-7A, please log into the web site [email or web address] and access using universal [place] password for that web site. If you do not have the password, please call the hospital operator.  06/13/2024, 5:21 PM

## 2024-06-13 NOTE — Progress Notes (Addendum)
 Patient ID: Gabriela Moyer, female   DOB: 18-Feb-1941, 83 y.o.   MRN: [id number] No problematic sequelae from yesterday's lumbar puncture.  Results thus far suggest no evidence of an infection.  Other detailed results are still pending.    Clinically less responsive this am... Question of weather ultimately she will require brain bx deferred at this time

## 2024-06-14 ENCOUNTER — Inpatient Hospital Stay (HOSPITAL_COMMUNITY)

## 2024-06-14 DIAGNOSIS — I639 Cerebral infarction, unspecified: Secondary | ICD-10-CM | POA: Diagnosis not present

## 2024-06-14 DIAGNOSIS — R410 Disorientation, unspecified: Secondary | ICD-10-CM

## 2024-06-14 DIAGNOSIS — R569 Unspecified convulsions: Secondary | ICD-10-CM | POA: Diagnosis not present

## 2024-06-14 DIAGNOSIS — G939 Disorder of brain, unspecified: Secondary | ICD-10-CM | POA: Diagnosis not present

## 2024-06-14 LAB — CBC WITH DIFFERENTIAL/PLATELET
Abs Immature Granulocytes: 0.05 10*3/uL (ref 0.00–0.07)
Basophils Absolute: 0 10*3/uL (ref 0.0–0.1)
Basophils Relative: 0 %
Eosinophils Absolute: 0.1 10*3/uL (ref 0.0–0.5)
Eosinophils Relative: 0 %
HCT: 43.1 % (ref 36.0–46.0)
Hemoglobin: 14.6 g/dL (ref 12.0–15.0)
Immature Granulocytes: 0 %
Lymphocytes Relative: 11 %
Lymphs Abs: 1.2 10*3/uL (ref 0.7–4.0)
MCH: 30.6 pg (ref 26.0–34.0)
MCHC: 33.9 g/dL (ref 30.0–36.0)
MCV: 90.4 fL (ref 80.0–100.0)
Monocytes Absolute: 0.8 10*3/uL (ref 0.1–1.0)
Monocytes Relative: 7 %
Neutro Abs: 9.2 10*3/uL — ABNORMAL HIGH (ref 1.7–7.7)
Neutrophils Relative %: 82 %
Platelets: 235 10*3/uL (ref 150–400)
RBC: 4.77 MIL/uL (ref 3.87–5.11)
RDW: 12 % (ref 11.5–15.5)
WBC: 11.4 10*3/uL — ABNORMAL HIGH (ref 4.0–10.5)
nRBC: 0 % (ref 0.0–0.2)

## 2024-06-14 LAB — COMPREHENSIVE METABOLIC PANEL WITH GFR
ALT: 35 U/L (ref 0–44)
AST: 48 U/L — ABNORMAL HIGH (ref 15–41)
Albumin: 2.9 g/dL — ABNORMAL LOW (ref 3.5–5.0)
Alkaline Phosphatase: 163 U/L — ABNORMAL HIGH (ref 38–126)
Anion gap: 13 (ref 5–15)
BUN: 22 mg/dL (ref 8–23)
CO2: 25 mmol/L (ref 22–32)
Calcium: 9.2 mg/dL (ref 8.9–10.3)
Chloride: 101 mmol/L (ref 98–111)
Creatinine, Ser: 1.14 mg/dL — ABNORMAL HIGH (ref 0.44–1.00)
GFR, Estimated: 47 mL/min — ABNORMAL LOW (ref >60)
Glucose, Bld: 79 mg/dL (ref 70–99)
Potassium: 3.8 mmol/L (ref 3.5–5.1)
Sodium: 139 mmol/L (ref 135–145)
Total Bilirubin: 0.7 mg/dL (ref 0.0–1.2)
Total Protein: 6 g/dL — ABNORMAL LOW (ref 6.5–8.1)

## 2024-06-14 LAB — VDRL, CSF: VDRL Quant, CSF: NONREACTIVE

## 2024-06-14 LAB — MAGNESIUM: Magnesium: 1.5 mg/dL — ABNORMAL LOW (ref 1.7–2.4)

## 2024-06-14 LAB — CYTOLOGY - NON PAP

## 2024-06-14 LAB — PHOSPHORUS: Phosphorus: 3.1 mg/dL (ref 2.5–4.6)

## 2024-06-14 MED ORDER — MAGNESIUM SULFATE 2 GM/50ML IV SOLN
2.0000 g | Freq: Once | INTRAVENOUS | Status: AC
  Administered 2024-06-14: 2 g via INTRAVENOUS
  Filled 2024-06-14: qty 50

## 2024-06-14 NOTE — Progress Notes (Signed)
 PROGRESS NOTE    Gabriela Moyer  ZOX:[id number] DOB: Mar 19, 1941 DOA: 06/06/2024 PCP: [name], [name], MD  Chief Complaint  Patient presents with   Migraine    Brief Narrative:   Gabriela Moyer is Gabriela Moyer 83 y.o. female with medical history significant of chronic migraine, essential hypertension, hyperlipidemia, extensive  PAD of subclavian, axillary, brachial and renal artery status post right renal artery stent 2010, hypothyroidism and paroxysmal afibrillation not on Xarelto  due to history of GI bleed recent emergency department complaining of headache for 4 days which is right-sided with associated vertigo, some vision change with the onset of the headache.    EEG with focal seizures.  MRI with leptomeningeal/pial-arachnoid enhancement in posterior L frontal lobe and high left parietal lobe.  Workup ongoing with assistance of neurology.  Assessment & Plan:   Principal Problem:   Ischemic cerebrovascular accident (CVA) (HCC) Active Problems:   Headache, right-sided blurry vision and dizziness   Migraine headache   AKI (acute kidney injury) (HCC)   Essential hypertension   Hyperlipidemia   History of migraine   PVD (peripheral vascular disease) (HCC)   History of stent insertion of renal artery   History of paroxysmal atrial tachycardia   History of GI bleed  Goals of Care Discussed with daughter, she notes DNR c/w previous conversations.  She discussed with the patient's sister and they were both in agreement with DNR.    Acute CVA Leptomeinigeal/pial-arachnoid enhancement in the posterior L frontal lobe and high left parietal lobe Focal seizures  Presented with migraine headache, nausea, vertigo, right visual field cut. MRI brain with leptomeningeal/pial-arachnoid enhancement in the posterior L frontal lobe and high left parietal lobe corresponding to an area of abnormality on CT - additional region of infarct in the R occipital lobe with associated leptomeningeal/pial arachnoid  enhancement C spine MRI with subtle leptomeningeal enhancement about the cervical cord (see report).  T spine MR with Subtle intermittent leptomeningeal enhancement about the thoracic cord (see report).  L spine with subtle leptomeningeal enhancement about the distal cord.   Differential include CNS lymphoma, vasculitis, leptomeningeal carcinomatosis secondary to metastatic solid tumor, CNS TB. Recent EEG without definite seizures noted, continuous EEG now d/c'd Neurology recommended Keppra  with 1500bid, Depakote  750 q12.   Repeat LP by neurosurgery successful, will follow pending LP labs (nl glucose, protein.  1 WBC, 2 RBC's.  negative crypto ag, negative meningitis/encephalitis panel, MT RIF NAA w cult).  Flow cytometry pending.  Cytology discontinued (will add back on).   Lyme PCR (not collected, will reorder).  Pending paraneoplastic ab.  AFB smear/culture pending.   Negative quant gold, negative RF, negative aldolase, ANCA negative, CRP, C3/C4 wnl, Scl-70 negative, anti CCP wnl, sed rate wnl, ENA negative, anti-jo 1 wnl CT chest/ abdomen/ pelvis showed 6mm right lung nodule need outpatient follow up, no malignancy detected. Appreciate further neurology recs PT/ OT advised CIR, awaiting neurology work up.  Acute Metabolic Encephalopathy Progressive worsening in encephalopathy - was Gabriela Moyer&Ox3 yesterday, but more lethargic (not as notable to me on my first day evaluating her, but notable to neurology/family).  Today, less able to consistently participate in exam (sitting in chair yesterday, in bed today).  MRI showed interval evolution of early subacute R PCA distribution infarct.  No significant interval vhange in appearance of leptomeningeal enhancement within the high posterior L frontoparietal region.  Subtly increased patchy flair signal abnormality with few foci of enhancement involving the L occipital lobe.   Suspect related to above process -  unclear what is causing rapid worsening Delirium  precautions EEG 6/16 with generalized periodic discharges with triphasic morphology, secondary to toxic - metabolic causes - currently on long term EEG.   UA with many bacteria, + LE, negative nitrite, 0-5 RBC.  Not convincing for UTI, will follow culture.  Treat empirically with encephalopathy.  Ammonia wnl, VBG without hypercarbia.  B12, folate.  TSH as below.   Workup additionally as needed  Hypokalemia: K improved.  Daily Kcl   Hypothyroidism: TSH high, suggesting synthroid  underdosed. 25 mcg is low dose, not clear why (1.6 micrograms/kg dose would be ~118 - on brief chart review, seems like she's been on this dose for years) bumped to 50 mcg and follow in 6 weeks or so  Free T4 elevated, free T3 wnl   Acute migraine headache with associated nausea Continue Fioricet  as needed. Tigan  for nausea.    Paroxysmal Afib RVR resolved Decreased home dose Toprol  to xl 25 daily. Not on anticoagulation due to gi bleed. Continue telemetry monitoring.   QT prolongation: Avoid QT prolonging drugs. Continue Tigan  as needed for nausea.   Hypotension. Orthostatic vitals positive. Low dose Toprol  xl 25 daily as she has Afib. Monitor vitals closely.    DVT prophylaxis: SCD Code Status: full Family Communication: none Disposition:   Status is: Inpatient Remains inpatient appropriate because: need for inpatient care   Consultants:  IR Neurology neurosurgery  Procedures:  LP by nsgy 6/14 LP by IR 6/10 LP by neurology 6/10  EEG/continuous EEG  Antimicrobials:  Anti-infectives (From admission, onward)    Start     Dose/Rate Route Frequency Ordered Stop   06/13/24 1830  cefTRIAXone (ROCEPHIN) 1 g in sodium chloride  0.9 % 100 mL IVPB        1 g 200 mL/hr over 30 Minutes Intravenous Every 24 hours 06/13/24 1735         Subjective: No complaints Lethargic Sister at bedside feels things are similar to yesterday  Objective: Vitals:   06/13/24 2017 06/14/24 0133  06/14/24 0538 06/14/24 0538  BP: (!) 144/51 104/69 113/73 113/73  Pulse: 76 76 79 77  Resp: 14 16 16 16   Temp: 98.6 F (37 C) 98.5 F (36.9 C) 98.6 F (37 C) 98.6 F (37 C)  TempSrc: Oral  Oral   SpO2: 95% 94%  97%  Weight:      Height:        Intake/Output Summary (Last 24 hours) at 06/14/2024 0905 Last data filed at 06/14/2024 0526 Gross per 24 hour  Intake 454.85 ml  Output 1250 ml  Net -795.15 ml   Filed Weights   06/06/24 1431  Weight: 74 kg    Examination:  General: chronically ill appearing. Cardiovascular: RRR Lungs: unlabored Abdomen: Soft, nontender, nondistended  Neurological: lethargic, but knew month, location.  Moving all extremities.  Doing Gabriela Moyer little better than yesterday. Extremities: No clubbing or cyanosis. No edema.   Data Reviewed: I have personally reviewed following labs and imaging studies  CBC: Recent Labs  Lab 06/11/24 0709 06/12/24 0556 06/13/24 1151 06/14/24 0443  WBC 12.4* 7.6 9.6 11.4*  NEUTROABS  --   --   --  9.2*  HGB 13.6 12.3 14.7 14.6  HCT 41.3 37.8 43.6 43.1  MCV 91.8 92.2 89.7 90.4  PLT 232 193 144* 235    Basic Metabolic Panel: Recent Labs  Lab 06/09/24 0443 06/10/24 1105 06/11/24 0709 06/12/24 0556 06/13/24 1151 06/14/24 0443  NA 140  --  144 142 138 139  K 3.0* 3.9 4.5 4.5 4.2 3.8  CL 106  --  111 105 100 101  CO2 25  --  22 26 25 25   GLUCOSE 103*  --  91 90 98 79  BUN 17  --  23 20 17 22   CREATININE 0.97  --  1.16* 1.08* 0.98 1.14*  CALCIUM  8.3*  --  9.5 9.5 9.6 9.2  MG  --   --   --   --   --  1.5*  PHOS  --   --   --   --   --  3.1    GFR: Estimated Creatinine Clearance: 34.6 mL/min (Gabriela Moyer) (by C-G formula based on SCr of 1.14 mg/dL (H)).  Liver Function Tests: Recent Labs  Lab 06/14/24 0443  AST 48*  ALT 35  ALKPHOS 163*  BILITOT 0.7  PROT 6.0*  ALBUMIN 2.9*    CBG: Recent Labs  Lab 06/13/24 0049 06/13/24 1004  GLUCAP 78 90     Recent Results (from the past 240 hours)  CSF culture w  Gram Stain     Status: None (Preliminary result)   Collection Time: 06/12/24  1:10 PM   Specimen: CSF; Cerebrospinal Fluid  Result Value Ref Range Status   Specimen Description CSF  Final   Special Requests NONE  Final   Gram Stain WBC SEEN NO ORGANISMS SEEN CYTOSPIN SMEAR   Final   Culture   Final    NO GROWTH < 24 HOURS Performed at [facility], [address], [place], [place] [address]    Report Status PENDING  Incomplete         Radiology Studies: EEG adult Result Date: 06/13/2024 Gabriela Lack, MD     06/13/2024  5:26 PM Patient Name: Gabriela Moyer MRN: [id number] Epilepsy Attending: Arleene Moyer Referring Physician/Provider: Etter Hermann., MD Date: 06/13/2024 Duration: 22.22 mins  Patient history: 83 y.o. female with visual disturbance. EEG to evaluate for seizure.  Level of alertness: comatose/ lethargic  AEDs during EEG study: LEV, VPA  Technical aspects: This EEG study was done with scalp electrodes positioned according to the 10-20 International system of electrode placement. Electrical activity was reviewed with band pass filter of 1-70Hz , sensitivity of 7 uV/mm, display speed of 39mm/sec with Gabriela Moyer 60Hz  notched filter applied as appropriate. EEG data were recorded continuously and digitally stored.  Video monitoring was available and reviewed as appropriate.  Description:  EEG showed continuous generalized 3 to 5 Hz theta-delta slowing, at times with triphasic morphology. Generalized periodic discharges with triphasic morphology were noted at 1-2hz .  Hyperventilation and photic stimulation were not performed.    ABNORMALITY  - Periodic discharges with triphasic morphology, generalized - Continuous slow,  generalized  IMPRESSION: This study showed generalized periodic discharges with triphasic morphology which could be secondary to toxic-metabolic causes. However, prior EEG did show seizures and the frequency reaches upto 2hz  which can be on the ictal-interictal  continuum. Therefore, recommend long term eeg for further characterization. Additionally there was moderate diffuse encephalopathy. No definite seizures were noted.  Gabriela Moyer   MR BRAIN W WO CONTRAST Result Date: 06/13/2024 CLINICAL DATA:  Follow-up examination four meningeal abnormality. EXAM: MRI HEAD WITHOUT AND WITH CONTRAST TECHNIQUE: Multiplanar, multiecho pulse sequences of the brain and surrounding structures were obtained without and with intravenous contrast. CONTRAST:  7mL GADAVIST  GADOBUTROL  1 MMOL/ML IV SOLN COMPARISON:  Comparison made with prior brain MRI from 06/06/2024 FINDINGS: Brain: Again seen is patchy  restricted diffusion involving the right occipital lobe, favored to reflect an evolving early subacute right PCA distribution infarct. No significant associated hemorrhage. Associated patchy post-contrast enhancement again seen within this region, now more parenchymal in appearance as compared to prior, likely related to an evolving infarct. No other new or interval areas of infarction. Additionally, there is patchy FLAIR signal intensity within the left occipital lobe, increased from prior (series 6, image 17). No acute 5 heights suspected subtle patchy post-contrast enhancement (series 10, image 44). Findings are nonspecific. Area of leptomeningeal enhancement within the high posterior left frontoparietal region again seen, not significantly changed as compared to previous. No other new areas of abnormal enhancement. No other mass lesion, mass effect, or midline shift. No hydrocephalus or extra-axial fluid collection. Pituitary gland suprasellar region within normal limits. No other abnormal enhancement. Vascular: Major intracranial vascular flow voids are maintained. Skull and upper cervical spine: Craniocervical junction within normal limits. Bone marrow signal intensity normal. No significant scalp soft tissue abnormality. Sinuses/Orbits: Globes and orbital soft tissues within  normal limits. Paranasal sinuses are largely clear. No mastoid effusion. Other: None. IMPRESSION: 1. 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 No associated hemorrhage or significant regional mass effect. 2. No significant interval change in appearance of leptomeningeal enhancement within the high posterior left frontoparietal region. Again, this finding is nonspecific, with primary differential considerations including changes of meningitis, leptomeningeal carcinomatosis, sarcoid, or possibly lymphoma. 3. Subtly increased patchy FLAIR signal abnormality with Florentine Diekman few foci of enhancement involving the left occipital lobe, new as compared to recent MRI from 06/06/2024. Findings are nonspecific, but could be related to recent seizure given provided history. Superimposed changes of PRES would be the primary differential consideration. Electronically Signed   By: Virgia Griffins M.D.   On: 06/13/2024 04:10        Scheduled Meds:  atorvastatin   40 mg Oral Daily   divalproex   750 mg Oral Q12H   famotidine  40 mg Oral Daily   levETIRAcetam   1,500 mg Intravenous Q12H   Or   levETIRAcetam   1,500 mg Oral Q12H   levothyroxine   50 mcg Oral Daily   metoprolol  succinate  25 mg Oral Daily   potassium chloride   20 mEq Oral Daily   sodium chloride  flush  3-10 mL Intravenous Q12H   Continuous Infusions:  cefTRIAXone (ROCEPHIN)  IV Stopped (06/13/24 1834)   sodium chloride  Stopped (06/07/24 1418)     LOS: 8 days    Time spent: over 30 min     Donnetta Gains, MD [facility]   To contact the attending provider between 7A-7P or the covering provider during after hours 7P-7A, please log into the web site [email or web address] and access using universal [place] password for that web site. If you do not have the password, please call the hospital operator.  06/14/2024, 9:05 AM

## 2024-06-14 NOTE — Progress Notes (Signed)
 Speech Language Pathology Treatment: Dysphagia  Patient Details Name: FUMIKO CHAM MRN: [id number] DOB: March 28, 1941 Today's Date: 06/14/2024 Time: 4098-1191 SLP Time Calculation (min) (ACUTE ONLY): 12 min  Assessment / Plan / Recommendation Clinical Impression  Pt is still quite lethargic today, but was able to wake up enough to take essential meds PO per RN. SLP provided repositioning and multimodal cueing to try to increase level of alertness. Pt opened her eyes and did state her name, but falls back to sleep very quickly. She will wake up to accept an ice chip, but then falls back asleep with it in her mouth, requiring Max cues from SLP to initiate mastication and then swallow. A little less cueing is needed with thin liquids given less oral manipulation needed, but there is still some oral holding and coughing is noted intermittently. Despite repeated attempts, pt did not maintain longer periods of alertness. Sister at bedside was educated on recommendation to maintain NPO status. Could still offer essential meds crushed in puree or ice chips if she is awake enough. SLP will f/u for readiness to resume a PO diet as level of alertness improves, at which time speech/language eval will also be more appropriate.    HPI HPI: Pt is an 83 y.o. female with who presented to the hospital on 06/06/24 after four days of severe headache (has migraine headaches at baseline) as well as vertigo and vision changes. CT head: 6 mm small hyperdense area adjacent to the right lateral ventricle  concerning for small brain lesion. Low-density area in the right occipital lobe could reflect infarct or edema related to underlying lesion. MRI brain was positive for Leptomeningeal/pial-arachnoid enhancement in the posterior left frontal lobe and high left parietal lobe with findings most concerning for meningitis and possible focus of encephalitis in right occipital lobe. EEG showed status epileptius. LP conducted at bedside 6/14  due to multple failed fluoroscopy guided lumbar punctures. SLP eval 6/10: patient appears WNL for cognition, language and speech function. SLP re-consulted 6/13 due to Difficulty reading comprehension with NIHSS and difficulty describing picture with mom washing dishes and swallow eval requested due to decreased alertness and mentation. 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 stent 2010      SLP Plan  Continue with current plan of care          Recommendations  Diet recommendations: NPO;Other(comment) (ice chips if fully alert) Medication Administration: Crushed with puree (essential meds, if alert)                  Oral care QID;Oral care prior to ice chip/H20     Dysphagia, unspecified (R13.10)     Continue with current plan of care     Beth Brooke., M.A. CCC-SLP Acute Rehabilitation Services Office: [phone]  Secure chat preferred   06/14/2024, 11:26 AM

## 2024-06-14 NOTE — Plan of Care (Signed)

## 2024-06-14 NOTE — Progress Notes (Signed)
 Inpatient Rehab Admissions Coordinator:    CIR following. Have insurance auth but Pt. Is now undergoing further work up and has experienced significant change in mental status. I will follow for the next few days and make an assessment of rehab needs.   Wandalee Gust, MS, CCC-SLP Rehab Admissions Coordinator  [phone] (celll) [phone] (office)

## 2024-06-14 NOTE — Progress Notes (Signed)
 Physical Therapy Treatment Patient Details Name: Gabriela Moyer MRN: [id number] DOB: 15-Apr-1941 Today's Date: 06/14/2024   History of Present Illness Pt is an 83 y/o F admitted on 06/06/24 after presenting with c/o HA x 4 days, as well as vertigo & vision changes. MRI brain w + w /o C which demonstrated leptomeningeal/pial-arachnoid enhancement in the posterior left frontal lobe and high left parietal lobe corresponding to area of abnormality on CT. 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 6/14 neuro status change & LP- results pending. PMH: chronic migraine, HTN, HLD, extensive PAD of subclavian, axillary, brachial & renal artery s/p R renal artery stent 2010, hypothyroidism, paroxysmal a-fib.    PT Comments  Pt with regression towards her physical therapy goals, with deficits in communication, strength, arousal and balance. Pt with no attempts at verbalization, but was able to accurately perform head nods yes/no to questions. Pt requiring two person modA + 2 for transfers and ambulating from [facility] to sink. Pt L hand slipping off walker but demonstrates good grip strength when asked to squeeze. Patient will benefit from intensive inpatient follow-up therapy, >3 hours/day.   If plan is discharge home, recommend the following: Assistance with cooking/housework;Assist for transportation;Help with stairs or ramp for entrance;A lot of help with walking and/or transfers;A lot of help with bathing/dressing/bathroom   Can travel by private vehicle        Equipment Recommendations  Rolling walker (2 wheels);BSC/3in1;Wheelchair (measurements PT);Wheelchair cushion (measurements PT)    Recommendations for Other Services       Precautions / Restrictions Precautions Precautions: Fall Precaution/Restrictions Comments: left visual field cut, orthostatic Restrictions Weight Bearing Restrictions Per Provider Order: No     Mobility  Bed Mobility Overal bed mobility: Needs  Assistance Bed Mobility: Supine to Sit     Supine to sit: Mod assist, +2 for physical assistance, +2 for safety/equipment          Transfers Overall transfer level: Needs assistance Equipment used: Rolling walker (2 wheels) Transfers: Sit to/from Stand, Bed to chair/wheelchair/BSC Sit to Stand: Mod assist, +2 physical assistance, +2 safety/equipment   Step pivot transfers: Mod assist, +2 safety/equipment, +2 physical assistance            Ambulation/Gait Ambulation/Gait assistance: Mod assist, +2 physical assistance Gait Distance (Feet): 5 Feet Assistive device: Rolling walker (2 wheels) Gait Pattern/deviations: Step-through pattern, Decreased stride length, Trunk flexed       General Gait Details: Pt ambulated from BSC>sink with mod A +2 for RW mgmt, balance and cues. Pt L hand slipping off walker   Stairs             Wheelchair Mobility     Tilt Bed    Modified Rankin (Stroke Patients Only)       Balance Overall balance assessment: Needs assistance Sitting-balance support: Feet supported Sitting balance-Leahy Scale: Poor     Standing balance support: Bilateral upper extremity supported, During functional activity Standing balance-Leahy Scale: Poor                              Communication Communication Communication: Impaired Factors Affecting Communication: Difficulty expressing self  Cognition Arousal: Lethargic Behavior During Therapy: Flat affect   PT - Cognitive impairments: Difficult to assess Difficult to assess due to: Impaired communication                     PT - Cognition Comments: Pt with  no attempts at verbalizations; accurately nodding to yes/no questions. Drowsy Following commands: Impaired Following commands impaired: Follows one step commands inconsistently    Cueing Cueing Techniques: Verbal cues, Gestural cues, Tactile cues  Exercises      General Comments General comments (skin integrity,  edema, etc.): Daughter present      Pertinent Vitals/Pain Pain Assessment Pain Assessment: Faces Faces Pain Scale: No hurt    Home Living                          Prior Function            PT Goals (current goals can now be found in the care plan section) Acute Rehab PT Goals Potential to Achieve Goals: Fair    Frequency    Min 3X/week      PT Plan      Co-evaluation PT/OT/SLP Co-Evaluation/Treatment: Yes Reason for Co-Treatment: Complexity of the patient's impairments (multi-system involvement);For patient/therapist safety;To address functional/ADL transfers PT goals addressed during session: Mobility/safety with mobility OT goals addressed during session: ADL's and self-care      AM-PAC PT 6 Clicks Mobility   Outcome Measure  Help needed turning from your back to your side while in a flat bed without using bedrails?: A Lot Help needed moving from lying on your back to sitting on the side of a flat bed without using bedrails?: A Lot Help needed moving to and from a bed to a chair (including a wheelchair)?: A Lot Help needed standing up from a chair using your arms (e.g., wheelchair or bedside chair)?: A Lot Help needed to walk in hospital room?: A Lot Help needed climbing 3-5 steps with a railing? : Total 6 Click Score: 11    End of Session Equipment Utilized During Treatment: Gait belt Activity Tolerance: Patient limited by fatigue Patient left: in chair;with call bell/phone within reach;with chair alarm set Nurse Communication: Mobility status PT Visit Diagnosis: Unsteadiness on feet (R26.81);Difficulty in walking, not elsewhere classified (R26.2)     Time: 6578-4696 PT Time Calculation (min) (ACUTE ONLY): 30 min  Charges:    $Therapeutic Activity: 8-22 mins PT General Charges $$ ACUTE PT VISIT: 1 Visit                     Verdia Glad, PT, DPT Acute Rehabilitation Services Office [phone]    Claria Crofts 06/14/2024,  4:12 PM

## 2024-06-14 NOTE — Progress Notes (Signed)
LTM EEG disconnected - no skin breakdown at unhook. [facility] notified.  

## 2024-06-14 NOTE — Progress Notes (Signed)
 LTM maint complete - no skin breakdown under:  A1, O2

## 2024-06-14 NOTE — Plan of Care (Signed)
 Patient's family was requesting to start diet.  Patient's nurse did a bedside swallow and as per the nurse patient was swallowing well.  So started on clear liquids.  Will closely observe.  Melford Spotted.

## 2024-06-14 NOTE — TOC Progression Note (Signed)
 Transition of Care [facility]) - Progression Note    Patient Details  Name: Gabriela Moyer MRN: [id number] Date of Birth: 1941-03-13  [facility] CM/SW Contact  Jonathan Neighbor, RN Phone Number: 06/14/2024, 12:51 PM  Clinical Narrative:     Continue to await results from LP to determine if brain bx needed. CIR following for potential rehab admission. TOC following for d/c needs.   Expected Discharge Plan: IP Rehab Facility    Expected Discharge Plan and Services                                               Social Determinants of Health (SDOH) Interventions SDOH Screenings   Food Insecurity: No Food Insecurity (06/08/2024)  Housing: Low Risk  (06/08/2024)  Transportation Needs: No Transportation Needs (06/08/2024)  Utilities: Not At Risk (06/08/2024)  Social Connections: Socially Isolated (06/08/2024)  Tobacco Use: Medium Risk (06/06/2024)    Readmission Risk Interventions     No data to display

## 2024-06-14 NOTE — Procedures (Addendum)
 Patient Name: JESSIAH WOJNAR  MRN: [id number]  Epilepsy Attending: Arlin MALVA Krebs  Referring Physician/Provider: Krebs Arlin MALVA, MD  Duration: 06/13/2024 1634 to 06/14/2024 1047   Patient history: 83 y.o. female with visual disturbance. EEG to evaluate for seizure.    Level of alertness: awake/lethargic, asleep   AEDs during EEG study: LEV, VPA   Technical aspects: This EEG study was done with scalp electrodes positioned according to the 10-20 International system of electrode placement. Electrical activity was reviewed with band pass filter of 1-70Hz , sensitivity of 7 uV/mm, display speed of 62mm/sec with a 60Hz  notched filter applied as appropriate. EEG data were recorded continuously and digitally stored.  Video monitoring was available and reviewed as appropriate.   Description:  No clear posterior dominant rhythm was seen.  Sleep was characterized by vertex waves, sleep spindles (12 to 14 Hz), maximal frontocentral region. EEG showed continuous generalized 3 to 5 Hz theta-delta slowing. Generalized periodic discharges with triphasic morphology were noted at 1-2hz , prominently seen when awake/stimulation.  Hyperventilation and photic stimulation were not performed.      ABNORMALITY  - Periodic discharges with triphasic morphology, generalized - Continuous slow,  generalized    IMPRESSION: This study showed generalized periodic discharges with triphasic morphology, most likely secondary to toxic-metabolic causes. Additionally there was moderate diffuse encephalopathy. No seizures were noted.   Alyssandra Hulsebus O Wisdom Rickey

## 2024-06-14 NOTE — Progress Notes (Signed)
 Occupational Therapy Treatment Patient Details Name: Gabriela Moyer MRN: [id number] DOB: 04-11-1941 Today's Date: 06/14/2024   History of present illness Pt is an 83 y/o F admitted on 06/06/24 after presenting with c/o HA x 4 days, as well as vertigo & vision changes. MRI brain w + w /o C which demonstrated leptomeningeal/pial-arachnoid enhancement in the posterior left frontal lobe and high left parietal lobe corresponding to area of abnormality on CT. 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 6/14 neuro status change & LP- results pending. PMH: chronic migraine, HTN, HLD, extensive PAD of subclavian, axillary, brachial & renal artery s/p R renal artery stent 2010, hypothyroidism, paroxysmal a-fib.   OT comments  Pt is making limited progress towards their acute OT goals. Pt had a change in functional status and now requires more assistance, session completed with PT. Throughout session pt gave accurate head nods most of the time, no verbalization attempts made. Pt followed most simple 1 step commands with increased time and multimodal cues. She expressed the need to have a BM - pt stood and step-pivoted with the RW and mod A +2. Total A required for hygiene after the BM. Pt also required mod A +2 to ambulate from the [facility] to the sink. Once standing she required total A +2 to attempt hand washing task, pt unable to complete due to fatigue. OT to continue to follow acutely to facilitate progress towards established goals. Pt will continue to benefit from intensive inpatient follow up therapy, >3 hours/day after discharge.       If plan is discharge home, recommend the following:  A little help with walking and/or transfers;A little help with bathing/dressing/bathroom;Assist for transportation;Help with stairs or ramp for entrance   Equipment Recommendations  None recommended by OT    Recommendations for Other Services Rehab consult    Precautions / Restrictions  Precautions Precautions: Fall Precaution/Restrictions Comments: left visual field cut, orthostatic Restrictions Weight Bearing Restrictions Per Provider Order: No       Mobility Bed Mobility Overal bed mobility: Needs Assistance Bed Mobility: Supine to Sit     Supine to sit: Mod assist, +2 for physical assistance, +2 for safety/equipment          Transfers Overall transfer level: Needs assistance Equipment used: Rolling walker (2 wheels) Transfers: Sit to/from Stand, Bed to chair/wheelchair/BSC Sit to Stand: Mod assist, +2 physical assistance, +2 safety/equipment     Step pivot transfers: Mod assist, +2 safety/equipment, +2 physical assistance     General transfer comment: Pt ambulated from BSC>sink with mod A +2 for RW mgmt, balance and cues     Balance Overall balance assessment: Needs assistance Sitting-balance support: Feet supported Sitting balance-Leahy Scale: Poor     Standing balance support: Bilateral upper extremity supported, During functional activity Standing balance-Leahy Scale: Poor                             ADL either performed or assessed with clinical judgement   ADL Overall ADL's : Needs assistance/impaired     Grooming: Total assistance;Standing Grooming Details (indicate cue type and reason): attempted hand washing task as sink, pt not initiating any aspect of task. ultimately required hand over hand and +2 for standing                 Toilet Transfer: Moderate assistance;+2 for safety/equipment;+2 for physical assistance;Stand-pivot;BSC/3in1;Rolling walker (2 wheels)   Toileting- Clothing Manipulation and Hygiene: Total  assistance;+2 for physical assistance;+2 for safety/equipment       Functional mobility during ADLs: Moderate assistance;+2 for physical assistance;+2 for safety/equipment General ADL Comments: pt had BM on BSC, mod A +2 to stand pivot with RW. Pt limited by weakness, balance, cognition, insight     Extremity/Trunk Assessment Upper Extremity Assessment Upper Extremity Assessment: RUE deficits/detail;LUE deficits/detail RUE Deficits / Details: difficult to assess due to impaired cognition. She grasped RW well but did not initiate or faciliate any other functional use of RUE. pt did raise are minimally to command adn attempt to reach for bed rail RUE Coordination: decreased fine motor;decreased gross motor LUE Deficits / Details: Seemingly poor proprioception, her hand kept slipping off of the RW. grip strength was weak on assessment, unable to faciliate a thumbs up LUE Sensation: decreased light touch;decreased proprioception LUE Coordination: decreased fine motor;decreased gross motor   Lower Extremity Assessment Lower Extremity Assessment: Defer to PT evaluation        Vision   Vision Assessment?: Yes Additional Comments: continues to have L field cut. Unable to facilitate any tracking or visual attention this date. pt maintained midline downward gaze   Perception Perception Perception: Impaired   Praxis Praxis Praxis: Impaired   Communication Communication Communication: Impaired Factors Affecting Communication: Difficulty expressing self   Cognition Arousal: Lethargic Behavior During Therapy: Flat affect Cognition: Difficult to assess Difficult to assess due to: Impaired communication           OT - Cognition Comments: pt did not attempt verbalizations, her head nods to yes/no were seemingly accurate most of the time. Pt followed some simple 1 step commands. Unable to faciliate any visual tracking or visual attention                 Following commands: Impaired Following commands impaired: Follows one step commands inconsistently      Cueing   Cueing Techniques: Verbal cues, Gestural cues, Tactile cues  Exercises      Shoulder Instructions       General Comments Daughter present    Pertinent Vitals/ Pain       Pain Assessment Pain  Assessment: Faces Faces Pain Scale: No hurt  Home Living                                          Prior Functioning/Environment              Frequency  Min 2X/week        Progress Toward Goals  OT Goals(current goals can now be found in the care plan section)  Progress towards OT goals: Not progressing toward goals - comment (pt had a change in status, now requires more assist)  Acute Rehab OT Goals Patient Stated Goal: per daughter, to get better OT Goal Formulation: With patient Time For Goal Achievement: 06/07/24 Potential to Achieve Goals: Good ADL Goals Pt Will Perform Grooming: with supervision;standing Pt Will Perform Lower Body Dressing: with supervision;sit to/from stand Pt Will Transfer to Toilet: with supervision;ambulating;regular height toilet Pt Will Perform Toileting - Clothing Manipulation and hygiene: with supervision;sit to/from stand Pt Will Perform Tub/Shower Transfer: with supervision;Tub transfer;shower seat;ambulating;rolling walker Additional ADL Goal #1: Pt will complete ADLs with dizziness reported at less than or equal to 3/10. Additional ADL Goal #2: Pt will locate items left of midline in the hallway or at the sink with 75% accurracy and no  more than min instructional cueing for eye and head turns.  Plan      Co-evaluation    PT/OT/SLP Co-Evaluation/Treatment: Yes Reason for Co-Treatment: Complexity of the patient's impairments (multi-system involvement);For patient/therapist safety;To address functional/ADL transfers   OT goals addressed during session: ADL's and self-care      AM-PAC OT 6 Clicks Daily Activity     Outcome Measure   Help from another person eating meals?: Total Help from another person taking care of personal grooming?: A Lot Help from another person toileting, which includes using toliet, bedpan, or urinal?: Total Help from another person bathing (including washing, rinsing, drying)?: A  Lot Help from another person to put on and taking off regular upper body clothing?: A Lot Help from another person to put on and taking off regular lower body clothing?: Total 6 Click Score: 9    End of Session Equipment Utilized During Treatment: Gait belt;Rolling walker (2 wheels)  OT Visit Diagnosis: Unsteadiness on feet (R26.81);Muscle weakness (generalized) (M62.81);Dizziness and giddiness (R42)   Activity Tolerance Patient limited by fatigue   Patient Left in chair;with call bell/phone within reach;with chair alarm set;with family/visitor present   Nurse Communication Mobility status        Time: 1422-1450 OT Time Calculation (min): 28 min  Charges: OT General Charges $OT Visit: 1 Visit OT Treatments $Self Care/Home Management : 8-22 mins  Gabriela Moyer, OTR/L Acute Rehabilitation Services Office [phone] Secure Chat Communication Preferred   Gabriela Moyer 06/14/2024, 3:17 PM

## 2024-06-14 NOTE — Progress Notes (Signed)
 NEUROLOGY CONSULT FOLLOW UP NOTE   Date of service: June 14, 2024 Patient Name: Gabriela Moyer MRN:  [id number] DOB:  Oct 20, 1941  Interval Hx/subjective  Patient remains hemodynamically stable and afebrile.  She is drowsy and requires stimulation to attend to exam but is oriented to person place time and situation.    Vitals   Vitals:   06/13/24 2017 06/14/24 0133 06/14/24 0538 06/14/24 0538  BP: (!) 144/51 104/69 113/73 113/73  Pulse: 76 76 79 77  Resp: 14 16 16 16   Temp: 98.6 F (37 C) 98.5 F (36.9 C) 98.6 F (37 C) 98.6 F (37 C)  TempSrc: Oral  Oral   SpO2: 95% 94%  97%  Weight:      Height:         Body mass index is 29.84 kg/m.  Physical Exam   Constitutional: Appears well-developed and well-nourished.  Eyes: No scleral injection.  HENT: No OP obstrucion.  Head: Normocephalic. EEG leads have been removed.  Respiratory: Effort normal, non-labored breathing.  Neurologic Examination   Mental Status: Patient rests with eyes closed but opens her eyes to voice.  She is able to state her name, location, time and states she is in the hospital because she had a stroke.  Responses are sometimes slow, and patient at times requires repeated stimulation to attend to exam Cranial Nerves: II: Left homonymous hemianopsia.   III,IV, VI: Extraocular movements intact VII: Smile symmetric VIII: Hearing intact to some questions and commands.  IX,X: Hypophonic speech XI: Symmetric Motor: Able to move all 4 extremities, antigravity strength in bilateral upper extremities Sensory: Appears intact to light touch Cerebellar: Finger-to-nose intact bilaterally Gait: Deferred  Medications  Current Facility-Administered Medications:    acetaminophen  (TYLENOL ) tablet 650 mg, 650 mg, Oral, Q4H PRN, 650 mg at 06/09/24 0236 **OR** acetaminophen  (TYLENOL ) 160 MG/5ML solution 650 mg, 650 mg, Per Tube, Q4H PRN **OR** acetaminophen  (TYLENOL ) suppository 650 mg, 650 mg, Rectal, Q4H PRN,  Sundil, Subrina, MD   atorvastatin  (LIPITOR) tablet 40 mg, 40 mg, Oral, Daily, Consuelo Denmark, MD, 40 mg at 06/13/24 4696   butalbital -acetaminophen -caffeine  (FIORICET ) 50-325-40 MG per tablet 1 tablet, 1 tablet, Oral, Q6H PRN, Sundil, Subrina, MD, 1 tablet at 06/13/24 0938   cefTRIAXone (ROCEPHIN) 1 g in sodium chloride  0.9 % 100 mL IVPB, 1 g, Intravenous, Q24H, Etter Hermann., MD, Stopped at 06/13/24 1834   diphenhydrAMINE  (BENADRYL ) capsule 50 mg, 50 mg, Oral, Q6H PRN, Pham, Minh Q, RPH-CPP   divalproex  (DEPAKOTE ) DR tablet 750 mg, 750 mg, Oral, Q12H, Shafer, Devon, NP, 750 mg at 06/14/24 0828   famotidine (PEPCID) tablet 40 mg, 40 mg, Oral, Daily, Sreeram, Narendranath, MD, 40 mg at 06/14/24 2952   levETIRAcetam  (KEPPRA ) undiluted injection 1,500 mg, 1,500 mg, Intravenous, Q12H, 1,500 mg at 06/14/24 0824 **OR** levETIRAcetam  (KEPPRA ) tablet 1,500 mg, 1,500 mg, Oral, Q12H, Shafer, Devon, NP, 1,500 mg at 06/13/24 2151   levothyroxine  (SYNTHROID ) tablet 50 mcg, 50 mcg, Oral, Daily, Etter Hermann., MD, 50 mcg at 06/14/24 0545   magnesium sulfate IVPB 2 g 50 mL, 2 g, Intravenous, Once, Etter Hermann., MD   metoprolol  succinate (TOPROL -XL) 24 hr tablet 25 mg, 25 mg, Oral, Daily, Sreeram, Narendranath, MD, 25 mg at 06/13/24 8413   potassium chloride  SA (KLOR-CON  M) CR tablet 20 mEq, 20 mEq, Oral, Daily, Sreeram, Narendranath, MD, 20 mEq at 06/14/24 2440   senna-docusate (Senokot-S) tablet 1 tablet, 1 tablet, Oral, QHS PRN, Sundil, Subrina, MD, 1 tablet  at 06/08/24 2202   sodium chloride  0.9 % bolus 500 mL, 500 mL, Intravenous, Once, Aisha Hove, MD, Held at 06/07/24 1418   sodium chloride  flush (NS) 0.9 % injection 3-10 mL, 3-10 mL, Intravenous, Q12H, Sundil, Subrina, MD, 10 mL at 06/14/24 0825   sodium chloride  flush (NS) 0.9 % injection 3-10 mL, 3-10 mL, Intravenous, PRN, Sundil, Subrina, MD   trimethobenzamide  (TIGAN ) injection 200 mg, 200 mg, Intramuscular, Q6H PRN,  Sundil, Subrina, MD  Labs and Diagnostic Imaging   CBC:  Recent Labs  Lab 06/13/24 1151 06/14/24 0443  WBC 9.6 11.4*  NEUTROABS  --  9.2*  HGB 14.7 14.6  HCT 43.6 43.1  MCV 89.7 90.4  PLT 144* 235    Basic Metabolic Panel:  Lab Results  Component Value Date   NA 139 06/14/2024   K 3.8 06/14/2024   CO2 25 06/14/2024   GLUCOSE 79 06/14/2024   BUN 22 06/14/2024   CREATININE 1.14 (H) 06/14/2024   CALCIUM  9.2 06/14/2024   GFRNONAA 47 (L) 06/14/2024   GFRAA 71 06/21/2019   Lipid Panel:  Lab Results  Component Value Date   LDLCALC 132 (H) 06/07/2024   HgbA1c:  Lab Results  Component Value Date   HGBA1C 5.5 06/07/2024   Urine Drug Screen: No results found for: LABOPIA, COCAINSCRNUR, LABBENZ, AMPHETMU, THCU, LABBARB  Alcohol  Level     Component Value Date/Time   [facility] <15 06/06/2024 1458   INR  Lab Results  Component Value Date   INR 1.0 06/06/2024   APTT  Lab Results  Component Value Date   APTT 27 06/06/2024   Summary of imaging studies:    - MRI of the brain demonstrates multiple abnormalities including a right PCA stroke along with leptomeningeal/pial-arachnoid enhancement in the posterior left frontal lobe and high left parietal lobe corresponding to area of abnormality on CT. In addition, there is diffuse dural enhancement on the right.  The right occipital lobe infarction may be due to an infiltrative process as there is subtle sulcal enhancement at this location as well.  - MRI of the cervical, thoracic and lumbar spine demonstrates subtle leptomeningeal enhancement about the cervical cord and thoracic spinal cord most prominently at T11-T12 with leptomeningeal enhancement of the distal cord as well - CT Chest Abdomen and Pelvis shows no evidence for primary malignancy.   Summary of pertinent labs: - Quantiferon-TB gold Plus is negative - RPR negative   Autoimmune encephalopathy panel is pan-negative:    EKG: Atrial fibrillation with rapid  ventricular response;   EEGs: - Spot EEG: This study showed 2 focal seizures without clinical signs arising from right occipital region at 0825 and 0833 lasting for about 1 minute 10 seconds each. Additionally there was evidence of cortical dysfunction in right occipital region likely secondary to underlying structural abnormality. - LTM read 6/10 demonstrated focal seizures in right oiccipital region. Depakote  500 mg BID was started at that time.  - LTM read 6/11 shows additional 3-4 seizures per hour. Increased Depakote  on 6/11 to 750 mg BID - LTM EEG report for 6/12: Focal seizure, right occipital region; background asymmetry, right<left; intermittent slow, right>left occipital region. This study showed focal seizures without clinical signs arising from the right occipital region. Seizures were noted on 06/10/2024 at 0127 and 0553, lasting for about 1 and half minute each. Additionally there was cortical dysfunction in right > left occipital region likely secondary to underlying structural abnormality.  - LTM EEG report on 6/13 demonstrated improvement: The study  showed cortical dysfunction in right > left occipital region, likely secondary to underlying structural abnormality, but with no definite seizures seen. - LTM EEG report on 6/14: Continuous slow,  generalized and maximal occipital region. This study showed cortical dysfunction in bilateral occipital region likely secondary to underlying structural abnormality. Additionally there was moderate diffuse encephalopathy. No definite seizures were noted  -LTM EEG report on 6/16 generalized periodic discharges with triphasic morphology, likely secondary to toxic metabolic causes, moderate diffuse encephalopathy and no seizures     Assessment  YOSHIKO KELEHER is an 84 y.o. female with who presented with her typical migraine headache symptoms along with nausea, vertigo and vision changes. 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 She was loaded with Depakote  and Keppra  was later added. Titration of her AEDs resulted in lowering of the frequency of the electrographic seizures and then resolution.    - Exam today is somewhat improved from yesterday, but patient continues to be quite drowsy.  Would suspect that poor sleep and delirium play a part in this, would initiate delirium precautions and reinforce good day night cycles. - LTM EEG report for Saturday morning: Continuous slow,  generalized and maximal occipital region. This study showed cortical dysfunction in bilateral occipital region likely secondary to underlying structural abnormality. Additionally there was moderate diffuse encephalopathy. No definite seizures were noted -LTM EEG was discontinued but then reconnected Sunday due to GPD's with triphasic morphology.  No seizures are seen, and LTM read demonstrates GPD's with triphasic morphology and moderate diffuse encephalopathy but no seizures. 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 No associated hemorrhage or significant regional mass effect. No significant interval change in appearance of leptomeningeal enhancement within the high posterior left frontoparietal region. Again, this finding is nonspecific, with primary differential considerations including changes of meningitis, leptomeningeal carcinomatosis, sarcoid, or possibly lymphoma.  Subtly increased patchy FLAIR signal abnormality with a few foci of enhancement involving the left occipital lobe, new as compared to recent MRI from 06/06/2024. Findings are nonspecific, but could be related to recent seizure given provided history. Superimposed changes of PRES would be the primary differential consideration, although BP has been well-controlled.  - Recent Labs:   - VPA level on 6/13 therapeutic at 70 - Ammonia level this morning (Sunday) is now normalized.   - ANA Ab, IFA: negative - ACE level normal - TSH elevated - Folate normal - B12 low at 316 - CRP normal - Lyme disease PCR pending - Results of LP performed Saturday by Neurosurgery:  - Clear, colorless - Glucose 42 (blood glucose of 90 seven hours earlier suggests increased glucose consumption within the CSF compartment).  - WBC 1 in tube 4 - Protein normal at 44 - Meningitis/encephalitis PCR panel negative - TB NAA test: Pending - Culture no growth at 24 hours - Gram stain: No organisms seen - Acid fast culture and smear: Pending - CSF cryptococcal Ag: Negative - Impression: - Differential for her lesions on MRI, with associated new onset seizures, is broad and includes CNS lymphoma, vasculitis, leptomeningeal carcinomatosis secondary to metastatic solid tumor and neurosarcoidosis.  -Given drowsiness, suspect that poor sleep schedule and hospital induced delirium are playing a part in patient's current presentation. - Bacterial meningitis is essentially off the DDx given that she is not encephalopathic, has no fever has only a  mildly elevated white count and has no neck stiffness A fungal meningitis is also possible but unlikely given the lack of meningismus with intact mentation.  - DDx for her right occipital lobe stroke includes atrial fibrillation (see EKG result above) versus an infiltrative process resulting in a vasculopathy at this location (see MRI discussion above).  - We feel that she may benefit from a biopsy of her left parietofrontal enhancing lesion as seen on MRI, but will await results of CSF cytology before deciding whether to do this              - Paroxysmal atrial fibrillation, not on anticoagulation due to history of GI bleed   Recommendations  - Synthroid  dosing per primary team - B12 supplementation - Continue Depakote  at 750mg  BID.  - Continue scheduled Keppra   at 1500 mg BID   - Not on anticoagulation for her atrial fibrillation due to GI bleed - BP management.   - Delirium precautions, encourage wakefulness during the day and rest at night - Discontinue LTM EEG as no seizures seen  Patient seen by NP with MD, MD to edit note as needed. Cortney E Bucky Cardinal , MSN, AGACNP-BC [facility] See Amion for schedule and pager information 06/14/2024 9:51 AM   NEUROHOSPITALIST ADDENDUM Performed a face to face diagnostic evaluation.   I have reviewed the contents of history and physical exam as documented by PA/ARNP/Resident and agree with above documentation.  I have discussed and formulated the above plan as documented. Edits to the note have been made as needed.   Ajeet Casasola, MD [facility] [phone]   If 7pm to 7am, please call on call as listed on AMION.

## 2024-06-14 NOTE — Progress Notes (Addendum)
 Subjective: The patient is somnolent but arousable.  She is in no apparent distress.  She is getting an EEG.  Her sister, Arden Kotyk, is at the bedside.  I spoke with her daughter, Deane Ewing at [phone] regarding a biopsy.  Objective: Vital signs in last 24 hours: Temp:  [97.9 F (36.6 C)-98.6 F (37 C)] 98.6 F (37 C) (06/16 0538) Pulse Rate:  [68-79] 77 (06/16 0538) Resp:  [14-16] 16 (06/16 0538) BP: (104-144)/(51-73) 113/73 (06/16 0538) SpO2:  [94 %-100 %] 97 % (06/16 0538) Estimated body mass index is 29.84 kg/m as calculated from the following:   Height as of this encounter: 5' 2 (1.575 m).   Weight as of this encounter: 74 kg.   Intake/Output from previous day: 06/15 0701 - 06/16 0700 In: 454.9 [P.O.:240; IV Piggyback:214.9] Out: 1250 [Urine:1250] Intake/Output this shift: No intake/output data recorded.  Physical exam patient is somnolent but arousable.  She can tell me the month and that she is at [facility].  She does not know the year.  She is moving all her extremities.  Pupils are equal.  Lab Results: Recent Labs    06/13/24 1151 06/14/24 0443  WBC 9.6 11.4*  HGB 14.7 14.6  HCT 43.6 43.1  PLT 144* 235   BMET Recent Labs    06/13/24 1151 06/14/24 0443  NA 138 139  K 4.2 3.8  CL 100 101  CO2 25 25  GLUCOSE 98 79  BUN 17 22  CREATININE 0.98 1.14*  CALCIUM  9.6 9.2    Studies/Results: Overnight EEG with video Result Date: 06/14/2024 Arleene Lack, MD     06/14/2024  9:58 AM Patient Name: BREANA LITTS MRN: [id number] Epilepsy Attending: Arleene Lack Referring Physician/Provider: Arleene Lack, MD Duration: 06/13/2024 1634 to 06/14/2024 0730  Patient history: 83 y.o. female with visual disturbance. EEG to evaluate for seizure.  Level of alertness: awake/lethargic, asleep  AEDs during EEG study: LEV, VPA  Technical aspects: This EEG study was done with scalp electrodes positioned according to the 10-20 International system of  electrode placement. Electrical activity was reviewed with band pass filter of 1-70Hz , sensitivity of 7 uV/mm, display speed of 68mm/sec with a 60Hz  notched filter applied as appropriate. EEG data were recorded continuously and digitally stored.  Video monitoring was available and reviewed as appropriate.  Description:  No clear posterior dominant rhythm was seen.  Sleep was characterized by vertex waves, sleep spindles (12 to 14 Hz), maximal frontocentral region. EEG showed continuous generalized 3 to 5 Hz theta-delta slowing. Generalized periodic discharges with triphasic morphology were noted at 1-2hz , prominently seen when awake/stimulation.  Hyperventilation and photic stimulation were not performed.    ABNORMALITY  - Periodic discharges with triphasic morphology, generalized - Continuous slow,  generalized  IMPRESSION: This study showed generalized periodic discharges with triphasic morphology, most likely secondary to toxic-metabolic causes. Additionally there was moderate diffuse encephalopathy. No seizures were noted.  Arleene Lack   EEG adult Result Date: 06/13/2024 Arleene Lack, MD     06/14/2024  9:56 AM Patient Name: RHYLEN PULIDO MRN: [id number] Epilepsy Attending: Arleene Lack Referring Physician/Provider: Etter Hermann., MD Date: 06/13/2024 Duration: 22.22 mins  Patient history: 83 y.o. female with visual disturbance. EEG to evaluate for seizure.  Level of alertness: comatose/ lethargic  AEDs during EEG study: LEV, VPA  Technical aspects: This EEG study was done with scalp electrodes positioned according to the 10-20 International system of electrode placement. Electrical activity  was reviewed with band pass filter of 1-70Hz , sensitivity of 7 uV/mm, display speed of 103mm/sec with a 60Hz  notched filter applied as appropriate. EEG data were recorded continuously and digitally stored.  Video monitoring was available and reviewed as appropriate.  Description:  EEG showed continuous  generalized 3 to 5 Hz theta-delta slowing. Generalized periodic discharges with triphasic morphology were noted at 1-2hz .  Hyperventilation and photic stimulation were not performed.    ABNORMALITY  - Periodic discharges with triphasic morphology, generalized - Continuous slow,  generalized  IMPRESSION: This study showed generalized periodic discharges with triphasic morphology which could be secondary to toxic-metabolic causes. However, prior EEG did show seizures and the frequency reaches upto 2hz  which can be on the ictal-interictal continuum. Therefore, recommend long term eeg for further characterization. Additionally there was moderate diffuse encephalopathy. No definite seizures were noted.  Arleene Lack   MR BRAIN W WO CONTRAST Result Date: 06/13/2024 CLINICAL DATA:  Follow-up examination four meningeal abnormality. EXAM: MRI HEAD WITHOUT AND WITH CONTRAST TECHNIQUE: Multiplanar, multiecho pulse sequences of the brain and surrounding structures were obtained without and with intravenous contrast. 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 No significant associated hemorrhage. Associated patchy post-contrast enhancement again seen within this region, now more parenchymal in appearance as compared to prior, likely related to an evolving infarct. No other new or interval areas of infarction. Additionally, there is patchy FLAIR signal intensity within the left occipital lobe, increased from prior (series 6, image 17). No acute 5 heights suspected subtle patchy post-contrast enhancement (series 10, image 44). Findings are nonspecific. Area of leptomeningeal enhancement within the high posterior left frontoparietal region again seen, not significantly changed as  compared to previous. No other new areas of abnormal enhancement. No other mass lesion, mass effect, or midline shift. No hydrocephalus or extra-axial fluid collection. Pituitary gland suprasellar region within normal limits. No other abnormal enhancement. Vascular: Major intracranial vascular flow voids are maintained. Skull and upper cervical spine: Craniocervical junction within normal limits. Bone marrow signal intensity normal. No significant scalp soft tissue abnormality. Sinuses/Orbits: Globes and orbital soft tissues within normal limits. Paranasal sinuses are largely clear. No mastoid effusion. Other: None. IMPRESSION: 1. 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 No associated hemorrhage or significant regional mass effect. 2. No significant interval change in appearance of leptomeningeal enhancement within the high posterior left frontoparietal region. Again, this finding is nonspecific, with primary differential considerations including changes of meningitis, leptomeningeal carcinomatosis, sarcoid, or possibly lymphoma. 3. Subtly increased patchy FLAIR signal abnormality with a few foci of enhancement involving the left occipital lobe, new as compared to recent MRI from 06/06/2024. Findings are nonspecific, but could be related to recent seizure given provided history. Superimposed changes of PRES would be the primary differential consideration. Electronically Signed   By: Virgia Griffins M.D.   On: 06/13/2024 04:10    Assessment/Plan: Abnormal brain MRI: I have discussed the situation with the patient's daughter and sister.  They are still awaiting the results of the spinal tap done this weekend.  The available results thus far are remarkable.  Hopefully this will give us  the diagnosis.  I put a call into the cytology but have not heard back yet.  If the diagnosis cannot  be made via the spinal fluid final results, an option  would be an open left posterior frontal brain biopsy.  I described this to the patient's daughter.  We discussed the risks including risk of anesthesia, hemorrhage, infection, seizures, failure to make the diagnosis, neurologic deficits, making a diagnosis we cannot do anything about, etc.  She has spoken to her brother.  We also discussed the alternative of doing nothing.  I have answered all her questions.  At this point they are amenable to a brain biopsy if the spinal fluid results are unable to give us  a diagnosis.  I have spoken with Dr. Ada Acres.   LOS: 8 days     Elder Greening 06/14/2024, 10:58 AM     Patient ID: Holly Lush, female   DOB: 1941/01/23, 83 y.o.   MRN: [id number]

## 2024-06-15 DIAGNOSIS — I639 Cerebral infarction, unspecified: Secondary | ICD-10-CM | POA: Diagnosis not present

## 2024-06-15 LAB — ACID FAST SMEAR (AFB, MYCOBACTERIA): Acid Fast Smear: NEGATIVE

## 2024-06-15 LAB — COMPREHENSIVE METABOLIC PANEL WITH GFR
ALT: 35 U/L (ref 0–44)
AST: 49 U/L — ABNORMAL HIGH (ref 15–41)
Albumin: 2.7 g/dL — ABNORMAL LOW (ref 3.5–5.0)
Alkaline Phosphatase: 146 U/L — ABNORMAL HIGH (ref 38–126)
Anion gap: 8 (ref 5–15)
BUN: 25 mg/dL — ABNORMAL HIGH (ref 8–23)
CO2: 25 mmol/L (ref 22–32)
Calcium: 9 mg/dL (ref 8.9–10.3)
Chloride: 105 mmol/L (ref 98–111)
Creatinine, Ser: 1.19 mg/dL — ABNORMAL HIGH (ref 0.44–1.00)
GFR, Estimated: 45 mL/min — ABNORMAL LOW (ref >60)
Glucose, Bld: 90 mg/dL (ref 70–99)
Potassium: 3.8 mmol/L (ref 3.5–5.1)
Sodium: 138 mmol/L (ref 135–145)
Total Bilirubin: 0.6 mg/dL (ref 0.0–1.2)
Total Protein: 5.6 g/dL — ABNORMAL LOW (ref 6.5–8.1)

## 2024-06-15 LAB — CSF CULTURE W GRAM STAIN

## 2024-06-15 LAB — URINE CULTURE: Culture: >=100000 — AB

## 2024-06-15 LAB — CBC WITH DIFFERENTIAL/PLATELET
Abs Immature Granulocytes: 0.05 10*3/uL (ref 0.00–0.07)
Basophils Absolute: 0 10*3/uL (ref 0.0–0.1)
Basophils Relative: 0 %
Eosinophils Absolute: 0.1 10*3/uL (ref 0.0–0.5)
Eosinophils Relative: 1 %
HCT: 41.1 % (ref 36.0–46.0)
Hemoglobin: 13.7 g/dL (ref 12.0–15.0)
Immature Granulocytes: 1 %
Lymphocytes Relative: 13 %
Lymphs Abs: 1.1 10*3/uL (ref 0.7–4.0)
MCH: 30.5 pg (ref 26.0–34.0)
MCHC: 33.3 g/dL (ref 30.0–36.0)
MCV: 91.5 fL (ref 80.0–100.0)
Monocytes Absolute: 0.9 10*3/uL (ref 0.1–1.0)
Monocytes Relative: 11 %
Neutro Abs: 5.8 10*3/uL (ref 1.7–7.7)
Neutrophils Relative %: 74 %
Platelets: 195 10*3/uL (ref 150–400)
RBC: 4.49 MIL/uL (ref 3.87–5.11)
RDW: 12 % (ref 11.5–15.5)
WBC: 7.9 10*3/uL (ref 4.0–10.5)
nRBC: 0 % (ref 0.0–0.2)

## 2024-06-15 LAB — MAGNESIUM: Magnesium: 2.3 mg/dL (ref 1.7–2.4)

## 2024-06-15 LAB — PHOSPHORUS: Phosphorus: 2.4 mg/dL — ABNORMAL LOW (ref 2.5–4.6)

## 2024-06-15 MED ORDER — CYANOCOBALAMIN 1000 MCG/ML IJ SOLN
1000.0000 ug | Freq: Once | INTRAMUSCULAR | Status: AC
  Administered 2024-06-15: 1000 ug via INTRAMUSCULAR
  Filled 2024-06-15: qty 1

## 2024-06-15 MED ORDER — VITAMIN B-12 1000 MCG PO TABS
1000.0000 ug | ORAL_TABLET | Freq: Every day | ORAL | Status: DC
  Administered 2024-06-16 – 2024-06-24 (×7): 1000 ug via ORAL
  Filled 2024-06-15 (×7): qty 1

## 2024-06-15 MED ORDER — CEFADROXIL 500 MG PO CAPS
500.0000 mg | ORAL_CAPSULE | Freq: Two times a day (BID) | ORAL | Status: DC
  Administered 2024-06-16 (×2): 500 mg via ORAL
  Filled 2024-06-15 (×4): qty 1

## 2024-06-15 NOTE — Progress Notes (Signed)
 Inpatient Rehab Admissions Coordinator:    CIR following at a distance. Open brain biopsy today.   Wandalee Gust, MS, CCC-SLP Rehab Admissions Coordinator  [phone] (celll) [phone] (office)

## 2024-06-15 NOTE — Progress Notes (Signed)
 Subjective: The patient is mildly somnolent but easily arousable.  She is in no apparent distress.  Her daughter-in-law is at the bedside.  Objective: Vital signs in last 24 hours: Temp:  [98 F (36.7 C)-98.2 F (36.8 C)] 98 F (36.7 C) (06/17 0412) Pulse Rate:  [74-77] 74 (06/17 0412) Resp:  [16-18] 18 (06/17 0412) BP: (97-111)/(60-75) 97/60 (06/17 0412) SpO2:  [94 %-97 %] 94 % (06/17 0412) Estimated body mass index is 29.84 kg/m as calculated from the following:   Height as of this encounter: 5' 2 (1.575 m).   Weight as of this encounter: 74 kg.   Intake/Output from previous day: 06/16 0701 - 06/17 0700 In: 15 [IV Piggyback:15] Out: 500 [Urine:500] Intake/Output this shift: No intake/output data recorded.  Physical exam the patient sleeping but easily awoken.  Answers questions.  She is moving all 4 extremities.  Lab Results: Recent Labs    06/14/24 0443 06/15/24 0438  WBC 11.4* 7.9  HGB 14.6 13.7  HCT 43.1 41.1  PLT 235 195   BMET Recent Labs    06/14/24 0443 06/15/24 0438  NA 139 138  K 3.8 3.8  CL 101 105  CO2 25 25  GLUCOSE 79 90  BUN 22 25*  CREATININE 1.14* 1.19*  CALCIUM  9.2 9.0    Studies/Results: Overnight EEG with video Result Date: 06/14/2024 Arleene Lack, MD     06/14/2024  9:58 AM Patient Name: Gabriela Moyer MRN: [id number] Epilepsy Attending: Arleene Lack Referring Physician/Provider: Arleene Lack, MD Duration: 06/13/2024 1634 to 06/14/2024 0730  Patient history: 83 y.o. female with visual disturbance. EEG to evaluate for seizure.  Level of alertness: awake/lethargic, asleep  AEDs during EEG study: LEV, VPA  Technical aspects: This EEG study was done with scalp electrodes positioned according to the 10-20 International system of electrode placement. Electrical activity was reviewed with band pass filter of 1-70Hz , sensitivity of 7 uV/mm, display speed of 80mm/sec with a 60Hz  notched filter applied as appropriate. EEG data were  recorded continuously and digitally stored.  Video monitoring was available and reviewed as appropriate.  Description:  No clear posterior dominant rhythm was seen.  Sleep was characterized by vertex waves, sleep spindles (12 to 14 Hz), maximal frontocentral region. EEG showed continuous generalized 3 to 5 Hz theta-delta slowing. Generalized periodic discharges with triphasic morphology were noted at 1-2hz , prominently seen when awake/stimulation.  Hyperventilation and photic stimulation were not performed.    ABNORMALITY  - Periodic discharges with triphasic morphology, generalized - Continuous slow,  generalized  IMPRESSION: This study showed generalized periodic discharges with triphasic morphology, most likely secondary to toxic-metabolic causes. Additionally there was moderate diffuse encephalopathy. No seizures were noted.  Arleene Lack   EEG adult Result Date: 06/13/2024 Arleene Lack, MD     06/14/2024  9:56 AM Patient Name: AKIYA MORR MRN: [id number] Epilepsy Attending: Arleene Lack Referring Physician/Provider: Etter Hermann., MD Date: 06/13/2024 Duration: 22.22 mins  Patient history: 83 y.o. female with visual disturbance. EEG to evaluate for seizure.  Level of alertness: comatose/ lethargic  AEDs during EEG study: LEV, VPA  Technical aspects: This EEG study was done with scalp electrodes positioned according to the 10-20 International system of electrode placement. Electrical activity was reviewed with band pass filter of 1-70Hz , sensitivity of 7 uV/mm, display speed of 57mm/sec with a 60Hz  notched filter applied as appropriate. EEG data were recorded continuously and digitally stored.  Video monitoring was available and reviewed as appropriate.  Description:  EEG showed continuous generalized 3 to 5 Hz theta-delta slowing. Generalized periodic discharges with triphasic morphology were noted at 1-2hz .  Hyperventilation and photic stimulation were not performed.    ABNORMALITY  -  Periodic discharges with triphasic morphology, generalized - Continuous slow,  generalized  IMPRESSION: This study showed generalized periodic discharges with triphasic morphology which could be secondary to toxic-metabolic causes. However, prior EEG did show seizures and the frequency reaches upto 2hz  which can be on the ictal-interictal continuum. Therefore, recommend long term eeg for further characterization. Additionally there was moderate diffuse encephalopathy. No definite seizures were noted.  Priyanka Suzanne Erps    Assessment/Plan: Abnormal brain MRI: The patient's cytology did not demonstrate any malignant cells.  Thus far spinal fluid has not given us  a diagnosis.  I spoke with the patient's daughter yesterday and her daughter-in-law today.  They want to proceed with an open brain biopsy as planned on Thursday.  LOS: 9 days     Elder Greening 06/15/2024, 7:40 AM     Patient ID: Holly Lush, female   DOB: [birth year], 83 y.o.   MRN: [id number]

## 2024-06-15 NOTE — Progress Notes (Signed)
 PROGRESS NOTE    DARLISA SPRUIELL  FAO:[id number] DOB: 1941-04-17 DOA: 06/06/2024 PCP: [name], [name], MD  Chief Complaint  Patient presents with   Migraine    Brief Narrative:   Gabriela Moyer is Lakasha Mcfall 83 y.o. female with medical history significant of chronic migraine, essential hypertension, hyperlipidemia, extensive  PAD of subclavian, axillary, brachial and renal artery status post right renal artery stent 2010, hypothyroidism and paroxysmal afibrillation not on Xarelto  due to history of GI bleed recent emergency department complaining of headache for 4 days which is right-sided with associated vertigo, some vision change with the onset of the headache.    EEG with focal seizures.  MRI with leptomeningeal/pial-arachnoid enhancement in posterior L frontal lobe and high left parietal lobe.  Workup ongoing with assistance of neurology.  Now s/p LP with pending labs.  Neurosurgery potentially planning open biopsy on 6/19.    Assessment & Plan:   Principal Problem:   Ischemic cerebrovascular accident (CVA) (HCC) Active Problems:   Headache, right-sided blurry vision and dizziness   Migraine headache   AKI (acute kidney injury) (HCC)   Essential hypertension   Hyperlipidemia   History of migraine   PVD (peripheral vascular disease) (HCC)   History of stent insertion of renal artery   History of paroxysmal atrial tachycardia   History of GI bleed  Goals of Care Discussed with daughter, she notes DNR c/w previous conversations.  She discussed with the patient's sister and they were both in agreement with DNR.    Acute CVA Leptomeinigeal/pial-arachnoid enhancement in the posterior L frontal lobe and high left parietal lobe Focal seizures  Presented with migraine headache, nausea, vertigo, right visual field cut. MRI brain with leptomeningeal/pial-arachnoid enhancement in the posterior L frontal lobe and high left parietal lobe corresponding to an area of abnormality on CT -  additional region of infarct in the R occipital lobe with associated leptomeningeal/pial arachnoid enhancement C spine MRI with subtle leptomeningeal enhancement about the cervical cord (see report).  T spine MR with Subtle intermittent leptomeningeal enhancement about the thoracic cord (see report).  L spine with subtle leptomeningeal enhancement about the distal cord.   Differential include CNS lymphoma, vasculitis, leptomeningeal carcinomatosis secondary to metastatic solid tumor, CNS TB. Recent EEG without definite seizures noted, continuous EEG now d/c'd Neurology recommended Keppra  with 1500bid, Depakote  750 q12.   Repeat LP by neurosurgery successful, will follow pending LP labs (nl glucose, protein.  1 WBC, 2 RBC's.  negative crypto ag, negative meningitis/encephalitis panel, MT RIF NAA w cult).  Flow cytometry pending.   Lyme PCR (not collected, will discuss with RN).  Pending paraneoplastic ab.  AFB smear (negative)/culture pending.   Negative quant gold, negative RF, negative aldolase, ANCA negative, CRP, C3/C4 wnl, Scl-70 negative, anti CCP wnl, sed rate wnl, ENA negative, anti-jo 1 wnl.  Cytology (no malignant cells) CT chest/ abdomen/ pelvis showed 6mm right lung nodule need outpatient follow up, no malignancy detected. Appreciate further neurology recs Neurosurgery planning open biopsy on 6/19 PT/ OT advised CIR, awaiting neurology work up.  Acute Metabolic Encephalopathy Fluctuating  MRI showed interval evolution of early subacute R PCA distribution infarct.  No significant interval vhange in appearance of leptomeningeal enhancement within the high posterior L frontoparietal region.  Subtly increased patchy flair signal abnormality with few foci of enhancement involving the L occipital lobe.   Suspect related to above process - unclear what is causing rapid worsening Delirium precautions EEG 6/16 with periodic discharges with triphasic morphology -  moderate diffuse encephalopathy -  no seizures noted UA with many bacteria, + LE, negative nitrite, 0-5 RBC.  Not convincing for UTI, will follow culture.  Treat empirically with encephalopathy.  Ammonia wnl, VBG without hypercarbia.  Low normal B12 (follow MMA and supplement), folate wnl.  TSH as below.   Workup additionally as needed  Hypokalemia: K improved.  Daily Kcl   Hypothyroidism: TSH high, suggesting synthroid  underdosed. 25 mcg is low dose, not clear why (1.6 micrograms/kg dose would be ~118 - on brief chart review, seems like she's been on this dose for years) bumped to 50 mcg and follow in 6 weeks or so  Free T4 elevated, free T3 wnl   Acute migraine headache with associated nausea Continue Fioricet  as needed. Tigan  for nausea.    Paroxysmal Afib RVR resolved Decreased home dose Toprol  to xl 25 daily. Not on anticoagulation due to gi bleed. Continue telemetry monitoring.   QT prolongation: Avoid QT prolonging drugs. Continue Tigan  as needed for nausea.   Hypotension. Orthostatic vitals positive. Low dose Toprol  xl 25 daily as she has Afib. Monitor vitals closely.    DVT prophylaxis: SCD Code Status: full Family Communication: daughter at bedside Disposition:   Status is: Inpatient Remains inpatient appropriate because: need for inpatient care   Consultants:  IR Neurology neurosurgery  Procedures:  LP by nsgy 6/14 LP by IR 6/10 LP by neurology 6/10  EEG/continuous EEG  Antimicrobials:  Anti-infectives (From admission, onward)    Start     Dose/Rate Route Frequency Ordered Stop   06/16/24 1000  cefadroxil (DURICEF) capsule 500 mg        500 mg Oral 2 times daily 06/15/24 1502 06/19/24 2359   06/13/24 1830  cefTRIAXone (ROCEPHIN) 1 g in sodium chloride  0.9 % 100 mL IVPB        1 g 200 mL/hr over 30 Minutes Intravenous Every 24 hours 06/13/24 1735 06/15/24 2359       Subjective: Says she's coming along  Objective: Vitals:   06/15/24 0412 06/15/24 0812 06/15/24 1202  06/15/24 1536  BP: 97/60 (!) 124/97 (!) 115/48 104/60  Pulse: 74 82 69 70  Resp: 18 18 20 14   Temp: 98 F (36.7 C) (!) 97.5 F (36.4 C) 98.1 F (36.7 C) 97.8 F (36.6 C)  TempSrc:  Oral Oral Oral  SpO2: 94% 98% 96% 98%  Weight:      Height:        Intake/Output Summary (Last 24 hours) at 06/15/2024 1726 Last data filed at 06/15/2024 0400 Gross per 24 hour  Intake --  Output 500 ml  Net -500 ml   Filed Weights   06/06/24 1431  Weight: 74 kg    Examination:  General: No acute distress. Cardiovascular: RRR Lungs: unlabored Neurological: Alert and oriented 3, but lethargic. Moves all extremities 4. Cranial nerves II through XII grossly intact. Extremities: No clubbing or cyanosis. No edema.   Data Reviewed: I have personally reviewed following labs and imaging studies  CBC: Recent Labs  Lab 06/11/24 0709 06/12/24 0556 06/13/24 1151 06/14/24 0443 06/15/24 0438  WBC 12.4* 7.6 9.6 11.4* 7.9  NEUTROABS  --   --   --  9.2* 5.8  HGB 13.6 12.3 14.7 14.6 13.7  HCT 41.3 37.8 43.6 43.1 41.1  MCV 91.8 92.2 89.7 90.4 91.5  PLT 232 193 144* 235 195    Basic Metabolic Panel: Recent Labs  Lab 06/11/24 0709 06/12/24 0556 06/13/24 1151 06/14/24 0443 06/15/24 0438  NA 144  142 138 139 138  K 4.5 4.5 4.2 3.8 3.8  CL 111 105 100 101 105  CO2 22 26 25 25 25   GLUCOSE 91 90 98 79 90  BUN 23 20 17 22  25*  CREATININE 1.16* 1.08* 0.98 1.14* 1.19*  CALCIUM  9.5 9.5 9.6 9.2 9.0  MG  --   --   --  1.5* 2.3  PHOS  --   --   --  3.1 2.4*    GFR: Estimated Creatinine Clearance: 33.2 mL/min (Tramar Brueckner) (by C-G formula based on SCr of 1.19 mg/dL (H)).  Liver Function Tests: Recent Labs  Lab 06/14/24 0443 06/15/24 0438  AST 48* 49*  ALT 35 35  ALKPHOS 163* 146*  BILITOT 0.7 0.6  PROT 6.0* 5.6*  ALBUMIN 2.9* 2.7*    CBG: Recent Labs  Lab 06/13/24 0049 06/13/24 1004  GLUCAP 78 90     Recent Results (from the past 240 hours)  Acid Fast Smear (AFB)     Status: None    Collection Time: 06/12/24  9:36 AM   Specimen: CSF  Result Value Ref Range Status   AFB Specimen Processing Concentration  Final   Acid Fast Smear Negative  Final    Comment: (NOTE) Performed At: [facility] [address] [place], [place] [id number] Pearlean Botts MD ON:[id number]    Source (AFB) CSF  Final    Comment: Performed at [facility], [address] [place], [place] [address]  CSF culture w Gram Stain     Status: None   Collection Time: 06/12/24  1:10 PM   Specimen: CSF; Cerebrospinal Fluid  Result Value Ref Range Status   Specimen Description CSF  Final   Special Requests NONE  Final   Gram Stain WBC SEEN NO ORGANISMS SEEN CYTOSPIN SMEAR   Final   Culture   Final    NO GROWTH 3 DAYS Performed at [facility], [address] [place], [place] [address]    Report Status 06/15/2024 FINAL  Final  MT-RIF NAA W Cult, Non-Sputum     Status: None (Preliminary result)   Collection Time: 06/12/24  1:10 PM   Specimen: CSF; Cerebrospinal Fluid  Result Value Ref Range Status   Source CSF  Final    Comment: Performed at [facility], [address] [place], [place] [address]   Specimen Source PENDING  Incomplete   M tuberculosis complex PENDING  Incomplete   Rifampin PENDING  Incomplete   AFB Specimen Processing Concentration  Final    Comment: (NOTE) Performed At: [facility] [address] [place], [place] [id number] Pearlean Botts MD LO:[id number]    Acid Fast Culture PENDING  Incomplete  Urine Culture (for pregnant, neutropenic or urologic patients or patients with an indwelling urinary catheter)     Status: Abnormal   Collection Time: 06/13/24  5:35 PM   Specimen: Urine, Clean Catch  Result Value Ref Range Status   Specimen Description URINE, CLEAN CATCH  Final   Special Requests   Final    NONE Performed at [facility], [address], [place], [place] [address]    Culture >=100,000 COLONIES/mL KLEBSIELLA PNEUMONIAE  (Jermond Burkemper)  Final   Report Status 06/15/2024 FINAL  Final   Organism ID, Bacteria KLEBSIELLA PNEUMONIAE (Babygirl Trager)  Final      Susceptibility   Klebsiella pneumoniae - MIC*    AMPICILLIN >=32 RESISTANT Resistant     CEFAZOLIN <=4 SENSITIVE Sensitive     CEFEPIME <=  0.12 SENSITIVE Sensitive     CEFTRIAXONE <=0.25 SENSITIVE Sensitive     CIPROFLOXACIN <=0.25 SENSITIVE Sensitive     GENTAMICIN <=1 SENSITIVE Sensitive     IMIPENEM 1 SENSITIVE Sensitive     NITROFURANTOIN 64 INTERMEDIATE Intermediate     TRIMETH/SULFA <=20 SENSITIVE Sensitive     AMPICILLIN/SULBACTAM 16 INTERMEDIATE Intermediate     PIP/TAZO 8 SENSITIVE Sensitive ug/mL    * >=100,000 COLONIES/mL KLEBSIELLA PNEUMONIAE         Radiology Studies: Overnight EEG with video Result Date: 06/14/2024 Arleene Lack, MD     06/14/2024  9:58 AM Patient Name: GIULIANA HANDYSIDE MRN: [id number] Epilepsy Attending: Arleene Lack Referring Physician/Provider: Arleene Lack, MD Duration: 06/13/2024 1634 to 06/14/2024 0730  Patient history: 83 y.o. female with visual disturbance. EEG to evaluate for seizure.  Level of alertness: awake/lethargic, asleep  AEDs during EEG study: LEV, VPA  Technical aspects: This EEG study was done with scalp electrodes positioned according to the 10-20 International system of electrode placement. Electrical activity was reviewed with band pass filter of 1-70Hz , sensitivity of 7 uV/mm, display speed of 57mm/sec with Cayli Escajeda 60Hz  notched filter applied as appropriate. EEG data were recorded continuously and digitally stored.  Video monitoring was available and reviewed as appropriate.  Description:  No clear posterior dominant rhythm was seen.  Sleep was characterized by vertex waves, sleep spindles (12 to 14 Hz), maximal frontocentral region. EEG showed continuous generalized 3 to 5 Hz theta-delta slowing. Generalized periodic discharges with triphasic morphology were noted at 1-2hz , prominently seen when awake/stimulation.   Hyperventilation and photic stimulation were not performed.    ABNORMALITY  - Periodic discharges with triphasic morphology, generalized - Continuous slow,  generalized  IMPRESSION: This study showed generalized periodic discharges with triphasic morphology, most likely secondary to toxic-metabolic causes. Additionally there was moderate diffuse encephalopathy. No seizures were noted.  Priyanka O Yadav        Scheduled Meds:  atorvastatin   40 mg Oral Daily   [START ON 06/16/2024] cefadroxil  500 mg Oral BID   divalproex   750 mg Oral Q12H   famotidine  40 mg Oral Daily   levETIRAcetam   1,500 mg Intravenous Q12H   Or   levETIRAcetam   1,500 mg Oral Q12H   levothyroxine   50 mcg Oral Daily   metoprolol  succinate  25 mg Oral Daily   potassium chloride   20 mEq Oral Daily   sodium chloride  flush  3-10 mL Intravenous Q12H   Continuous Infusions:  cefTRIAXone (ROCEPHIN)  IV 1 g (06/14/24 1722)   sodium chloride  Stopped (06/07/24 1418)     LOS: 9 days    Time spent: over 30 min     Donnetta Gains, MD [facility]   To contact the attending provider between 7A-7P or the covering provider during after hours 7P-7A, please log into the web site [email or web address] and access using universal [place] password for that web site. If you do not have the password, please call the hospital operator.  06/15/2024, 5:26 PM

## 2024-06-16 DIAGNOSIS — I639 Cerebral infarction, unspecified: Secondary | ICD-10-CM | POA: Diagnosis not present

## 2024-06-16 LAB — CBC WITH DIFFERENTIAL/PLATELET
Abs Immature Granulocytes: 0.1 10*3/uL — ABNORMAL HIGH (ref 0.00–0.07)
Basophils Absolute: 0 10*3/uL (ref 0.0–0.1)
Basophils Relative: 0 %
Eosinophils Absolute: 0.1 10*3/uL (ref 0.0–0.5)
Eosinophils Relative: 1 %
HCT: 43.5 % (ref 36.0–46.0)
Hemoglobin: 14.4 g/dL (ref 12.0–15.0)
Immature Granulocytes: 1 %
Lymphocytes Relative: 13 %
Lymphs Abs: 1.1 10*3/uL (ref 0.7–4.0)
MCH: 30.6 pg (ref 26.0–34.0)
MCHC: 33.1 g/dL (ref 30.0–36.0)
MCV: 92.6 fL (ref 80.0–100.0)
Monocytes Absolute: 1.1 10*3/uL — ABNORMAL HIGH (ref 0.1–1.0)
Monocytes Relative: 13 %
Neutro Abs: 5.7 10*3/uL (ref 1.7–7.7)
Neutrophils Relative %: 72 %
Platelets: 170 10*3/uL (ref 150–400)
RBC: 4.7 MIL/uL (ref 3.87–5.11)
RDW: 12 % (ref 11.5–15.5)
WBC: 8.1 10*3/uL (ref 4.0–10.5)
nRBC: 0 % (ref 0.0–0.2)

## 2024-06-16 LAB — COMPREHENSIVE METABOLIC PANEL WITH GFR
ALT: 35 U/L (ref 0–44)
AST: 52 U/L — ABNORMAL HIGH (ref 15–41)
Albumin: 2.7 g/dL — ABNORMAL LOW (ref 3.5–5.0)
Alkaline Phosphatase: 164 U/L — ABNORMAL HIGH (ref 38–126)
Anion gap: 9 (ref 5–15)
BUN: 16 mg/dL (ref 8–23)
CO2: 23 mmol/L (ref 22–32)
Calcium: 8.9 mg/dL (ref 8.9–10.3)
Chloride: 105 mmol/L (ref 98–111)
Creatinine, Ser: 1.09 mg/dL — ABNORMAL HIGH (ref 0.44–1.00)
GFR, Estimated: 50 mL/min — ABNORMAL LOW (ref >60)
Glucose, Bld: 89 mg/dL (ref 70–99)
Potassium: 3.9 mmol/L (ref 3.5–5.1)
Sodium: 137 mmol/L (ref 135–145)
Total Bilirubin: 0.6 mg/dL (ref 0.0–1.2)
Total Protein: 5.8 g/dL — ABNORMAL LOW (ref 6.5–8.1)

## 2024-06-16 LAB — MAGNESIUM: Magnesium: 2.1 mg/dL (ref 1.7–2.4)

## 2024-06-16 LAB — PHOSPHORUS: Phosphorus: 1.9 mg/dL — ABNORMAL LOW (ref 2.5–4.6)

## 2024-06-16 LAB — FLOW CYTOMETRY REQUEST - FLUID (INPATIENT): Lymphocyte subsets: UNDETERMINED

## 2024-06-16 NOTE — Progress Notes (Signed)
 PROGRESS NOTE  Gabriela Moyer BJY:[id number] DOB: [birth year] DOA: 06/06/2024 PCP: [name], [name], MD   LOS: 10 days   Brief Narrative / Interim history: Gabriela Moyer is a 83 y.o. female with medical history significant of chronic migraine, essential hypertension, hyperlipidemia, extensive  PAD of subclavian, axillary, brachial and renal artery status post right renal artery stent 2010, hypothyroidism and paroxysmal afibrillation not on Xarelto  due to history of GI bleed recent emergency department complaining of headache for 4 days which is right-sided with associated vertigo, some vision change with the onset of the headache. EEG with focal seizures.  MRI with leptomeningeal/pial-arachnoid enhancement in posterior L frontal lobe and high left parietal lobe.  Workup ongoing with assistance of neurology.  Underwent LP, neurosurgery planning open biopsy on 6/19  Subjective / 24h Interval events: She is doing well this morning, son is at bedside.  She wakes up easily, seems to be alert and oriented x 4  Assesement and Plan: Principal Problem:   Ischemic cerebrovascular accident (CVA) (HCC) Active Problems:   Headache, right-sided blurry vision and dizziness   Migraine headache   AKI (acute kidney injury) (HCC)   Essential hypertension   Hyperlipidemia   History of migraine   PVD (peripheral vascular disease) (HCC)   History of stent insertion of renal artery   History of paroxysmal atrial tachycardia   History of GI bleed   Principal problem Acute CVA, Leptomeinigeal/pial-arachnoid enhancement in the posterior L frontal lobe and high left parietal lobe, Focal seizures  -patient initially presented to the hospital with migraines, headache, right visual field cut.  She underwent an MRI of the brain which showed leptomeningeal/pial-arachnoid enhancement in the posterior L frontal lobe and high left parietal lobe corresponding to an area of abnormality on CT - additional region of infarct  in the R occipital lobe with associated leptomeningeal/pial arachnoid enhancement.  Differential is broad including CNS lymphoma, vasculitis, leptomeningeal carcinomatosis, TB.  Recent EEG without definitive seizures, now discontinued currently on Keppra  1500 mg BID and Depakote  750 mg Q12 - LP fairly unremarkable, AFB smear negative, glucose was normal, 1 WBC, 2 RBC, negative crypto antigen, negative meningitis/encephalitis panel.  Negative quant gold, negative RF, negative aldolase, ANCA negative, CRP, C3/C4 wnl, Scl-70 negative, anti CCP wnl, sed rate wnl, ENA negative, anti-jo 1 wnl.  Cytology (no malignant cells).  - Neurosurgery also following, planning for open biopsy 6/19  Active problems Goals of Care -DNR   Acute Metabolic Encephalopathy -Mental status is fluctuating . MRI showed interval evolution of early subacute R PCA distribution infarct.  No significant interval vhange in appearance of leptomeningeal enhancement within the high posterior L frontoparietal region.  Subtly increased patchy flair signal abnormality with few foci of enhancement involving the L occipital lobe.  Suspect related to above process - unclear what is causing rapid worsening. Delirium precautions -  EEG 6/16 with periodic discharges with triphasic morphology - moderate diffuse encephalopathy - no seizures noted  Hypokalemia - K improved.  Continue to monitor and replace as indicated  Hypothyroidism - TSH high at 17, suggesting synthroid  underdosed.  Synthroid  increased to 50 mcg, repeat follow-up should be done in 4 weeks   Acute migraine headache with associated nausea - Continue Fioricet  as needed. Tigan  for nausea.    Paroxysmal Afib - RVR resolved. Decreased home dose Toprol  to xl 25 daily. Not on anticoagulation due to gi bleed. Continue telemetry monitoring.   QT prolongation -Avoid QT prolonging drugs. Continue Tigan  as needed for nausea.  Hypotension - Orthostatic vitals positive. Low dose Toprol  xl  25 daily as she has Afib. Monitor vitals closely.  Scheduled Meds:  atorvastatin   40 mg Oral Daily   cefadroxil  500 mg Oral BID   vitamin B-12  1,000 mcg Oral Daily   divalproex   750 mg Oral Q12H   famotidine  40 mg Oral Daily   levETIRAcetam   1,500 mg Intravenous Q12H   Or   levETIRAcetam   1,500 mg Oral Q12H   levothyroxine   50 mcg Oral Daily   metoprolol  succinate  25 mg Oral Daily   potassium chloride   20 mEq Oral Daily   sodium chloride  flush  3-10 mL Intravenous Q12H   Continuous Infusions:  sodium chloride  Stopped (06/07/24 1418)   PRN Meds:.acetaminophen  **OR** acetaminophen  (TYLENOL ) oral liquid 160 mg/5 mL **OR** acetaminophen , butalbital -acetaminophen -caffeine , diphenhydrAMINE , senna-docusate, sodium chloride  flush, trimethobenzamide   Current Outpatient Medications  Medication Instructions   amLODipine  (NORVASC ) 5 mg, Oral, Daily   clopidogrel  (PLAVIX ) 75 mg, Oral, Daily   ezetimibe  (ZETIA ) 10 MG tablet TAKE 1 TABLET(10 MG) BY MOUTH DAILY   hydrochlorothiazide  (HYDRODIURIL ) 25 MG tablet TAKE 1 TABLET(25 MG) BY MOUTH DAILY   levothyroxine  (SYNTHROID , LEVOTHROID) 25 MCG tablet 1 tablet, Daily   metoprolol  succinate (TOPROL -XL) 100 MG 24 hr tablet TAKE 1 TABLET(100 MG) BY MOUTH DAILY   metoprolol  tartrate (LOPRESSOR ) 50 MG tablet Take 2 hours prior to procedure   pravastatin  (PRAVACHOL ) 40 mg, Oral, Every evening   rosuvastatin (CRESTOR) 5 mg, Oral, Daily PRN    Diet Orders (From admission, onward)     Start     Ordered   06/15/24 1858  DIET DYS 3 Room service appropriate? Yes; Fluid consistency: Thin  Diet effective now       Question Answer Comment  Room service appropriate? Yes   Fluid consistency: Thin      06/15/24 1858            DVT prophylaxis: Place and maintain sequential compression device Start: 06/12/24 1633 Place and maintain sequential compression device Start: 06/07/24 1849 Place TED hose Start: 06/07/24 1849   Lab Results  Component  Value Date   PLT 170 06/16/2024      Code Status: Limited: Do not attempt resuscitation (DNR) -DNR-LIMITED -Do Not Intubate/DNI   Family Communication: Son present at bedside  Status is: Inpatient Remains inpatient appropriate because: severity of illness  Level of care: Telemetry Medical  Consultants:  Neurology Neurosurgery   Objective: Vitals:   06/15/24 2027 06/15/24 2354 06/16/24 0357 06/16/24 0820  BP: (!) 112/95 115/69 (!) 105/57 119/78  Pulse: 66 67 65 72  Resp: 14 16 16 18   Temp: (!) 97.4 F (36.3 C) 98.3 F (36.8 C) 98.4 F (36.9 C) 98.3 F (36.8 C)  TempSrc: Oral Oral Oral Oral  SpO2: 96% 98% 96% 97%  Weight:      Height:        Intake/Output Summary (Last 24 hours) at 06/16/2024 1033 Last data filed at 06/16/2024 0913 Gross per 24 hour  Intake 100 ml  Output --  Net 100 ml   Wt Readings from Last 3 Encounters:  06/06/24 74 kg  07/02/23 74 kg  07/03/22 77.6 kg    Examination:  Constitutional: NAD Eyes: no scleral icterus ENMT: Mucous membranes are moist.  Neck: normal, supple Respiratory: clear to auscultation bilaterally, no wheezing, no crackles. Normal respiratory effort.  Cardiovascular: Regular rate and rhythm, no murmurs / rubs / gallops. No LE edema.  Abdomen: non distended,  no tenderness. Bowel sounds positive.  Musculoskeletal: no clubbing / cyanosis.   Data Reviewed: I have independently reviewed following labs and imaging studies   CBC Recent Labs  Lab 06/12/24 0556 06/13/24 1151 06/14/24 0443 06/15/24 0438 06/16/24 0457  WBC 7.6 9.6 11.4* 7.9 8.1  HGB 12.3 14.7 14.6 13.7 14.4  HCT 37.8 43.6 43.1 41.1 43.5  PLT 193 144* 235 195 170  MCV 92.2 89.7 90.4 91.5 92.6  MCH 30.0 30.2 30.6 30.5 30.6  MCHC 32.5 33.7 33.9 33.3 33.1  RDW 12.4 12.0 12.0 12.0 12.0  LYMPHSABS  --   --  1.2 1.1 1.1  MONOABS  --   --  0.8 0.9 1.1*  EOSABS  --   --  0.1 0.1 0.1  BASOSABS  --   --  0.0 0.0 0.0    Recent Labs  Lab 06/11/24 1108  06/12/24 0556 06/13/24 1151 06/14/24 0443 06/15/24 0438 06/16/24 0457  NA  --  142 138 139 138 137  K  --  4.5 4.2 3.8 3.8 3.9  CL  --  105 100 101 105 105  CO2  --  26 25 25 25 23   GLUCOSE  --  90 98 79 90 89  BUN  --  20 17 22  25* 16  CREATININE  --  1.08* 0.98 1.14* 1.19* 1.09*  CALCIUM   --  9.5 9.6 9.2 9.0 8.9  AST  --   --   --  48* 49* 52*  ALT  --   --   --  35 35 35  ALKPHOS  --   --   --  163* 146* 164*  BILITOT  --   --   --  0.7 0.6 0.6  ALBUMIN  --   --   --  2.9* 2.7* 2.7*  MG  --   --   --  1.5* 2.3 2.1  AMMONIA 71* 46* 32  --   --   --     ------------------------------------------------------------------------------------------------------------------ No results for input(s): CHOL, HDL, LDLCALC, TRIG, CHOLHDL, LDLDIRECT in the last 72 hours.  Lab Results  Component Value Date   HGBA1C 5.5 06/07/2024   ------------------------------------------------------------------------------------------------------------------ No results for input(s): TSH, T4TOTAL, T3FREE, THYROIDAB in the last 72 hours.  Invalid input(s): FREET3  Cardiac Enzymes No results for input(s): CKMB, TROPONINI, MYOGLOBIN in the last 168 hours.  Invalid input(s): CK ------------------------------------------------------------------------------------------------------------------ No results found for: BNP  CBG: Recent Labs  Lab 06/13/24 0049 06/13/24 1004  GLUCAP 78 90    Recent Results (from the past 240 hours)  Acid Fast Smear (AFB)     Status: None   Collection Time: 06/12/24  9:36 AM   Specimen: CSF  Result Value Ref Range Status   AFB Specimen Processing Concentration  Final   Acid Fast Smear Negative  Final    Comment: (NOTE) Performed At: [facility] [address] [place], [place] [id number] Pearlean Botts MD ZH:[id number]    Source (AFB) CSF  Final    Comment: Performed at [facility], [address] [place],  [place] [address]  CSF culture w Gram Stain     Status: None   Collection Time: 06/12/24  1:10 PM   Specimen: CSF; Cerebrospinal Fluid  Result Value Ref Range Status   Specimen Description CSF  Final   Special Requests NONE  Final   Gram Stain WBC SEEN NO ORGANISMS SEEN CYTOSPIN SMEAR   Final   Culture   Final    NO  GROWTH 3 DAYS Performed at [facility], [address], [place], [place] [address]    Report Status 06/15/2024 FINAL  Final  MT-RIF NAA W Cult, Non-Sputum     Status: None (Preliminary result)   Collection Time: 06/12/24  1:10 PM   Specimen: CSF; Cerebrospinal Fluid  Result Value Ref Range Status   Source CSF  Final    Comment: Performed at [facility], [address], [place], [place] [address]   Specimen Source PENDING  Incomplete   M tuberculosis complex PENDING  Incomplete   Rifampin PENDING  Incomplete   AFB Specimen Processing Concentration  Final    Comment: (NOTE) Performed At: [facility] [address] [place], [place] [id number] Pearlean Botts MD VH:[id number]    Acid Fast Culture PENDING  Incomplete  Urine Culture (for pregnant, neutropenic or urologic patients or patients with an indwelling urinary catheter)     Status: Abnormal   Collection Time: 06/13/24  5:35 PM   Specimen: Urine, Clean Catch  Result Value Ref Range Status   Specimen Description URINE, CLEAN CATCH  Final   Special Requests   Final    NONE Performed at [facility], [address], [place], [place] [address]    Culture >=100,000 COLONIES/mL KLEBSIELLA PNEUMONIAE (A)  Final   Report Status 06/15/2024 FINAL  Final   Organism ID, Bacteria KLEBSIELLA PNEUMONIAE (A)  Final      Susceptibility   Klebsiella pneumoniae - MIC*    AMPICILLIN >=32 RESISTANT Resistant     CEFAZOLIN <=4 SENSITIVE Sensitive     CEFEPIME <=0.12 SENSITIVE Sensitive     CEFTRIAXONE <=0.25 SENSITIVE Sensitive     CIPROFLOXACIN <=0.25 SENSITIVE Sensitive     GENTAMICIN <=1 SENSITIVE  Sensitive     IMIPENEM 1 SENSITIVE Sensitive     NITROFURANTOIN 64 INTERMEDIATE Intermediate     TRIMETH/SULFA <=20 SENSITIVE Sensitive     AMPICILLIN/SULBACTAM 16 INTERMEDIATE Intermediate     PIP/TAZO 8 SENSITIVE Sensitive ug/mL    * >=100,000 COLONIES/mL KLEBSIELLA PNEUMONIAE     Radiology Studies: No results found.   Kathlen Para, MD, PhD [facility]  Between 7 am - 7 pm I am available, please contact me via Amion (for emergencies) or Securechat (non urgent messages)  Between 7 pm - 7 am I am not available, please contact night coverage MD/APP via Amion

## 2024-06-16 NOTE — Progress Notes (Signed)
 Physical Therapy Treatment Patient Details Name: Gabriela Moyer MRN: [id number] DOB: 07-25-1941 Today's Date: 06/16/2024   History of Present Illness Pt is an 83 y/o F admitted on 06/06/24 after presenting with c/o HA x 4 days, as well as vertigo & vision changes. MRI brain w + w /o C which demonstrated leptomeningeal/pial-arachnoid enhancement in the posterior left frontal lobe and high left parietal lobe corresponding to area of abnormality on CT. 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 6/14 neuro status change & LP- results pending. PMH: chronic migraine, HTN, HLD, extensive PAD of subclavian, axillary, brachial & renal artery s/p R renal artery stent 2010, hypothyroidism, paroxysmal a-fib.    PT Comments  Pt drowsy, but arousable with verbal stimulation. Pt able to verbalize today, although difficult to understand at times. Demonstrates delayed processing and is slow to follow commands. Pt transitioning to edge of bed without physical assist. Stood from edge of bed x 2 with RW and +2 modA. Pt deferring ambulation with decreased weightbearing through R foot due to reports of straining, pain along plantar surface of R foot. Skin is intact upon assessment. Will continue to progress as tolerated.   If plan is discharge home, recommend the following: Assistance with cooking/housework;Assist for transportation;Help with stairs or ramp for entrance;A lot of help with walking and/or transfers;A lot of help with bathing/dressing/bathroom   Can travel by private vehicle        Equipment Recommendations  Rolling walker (2 wheels);BSC/3in1;Wheelchair (measurements PT);Wheelchair cushion (measurements PT)    Recommendations for Other Services       Precautions / Restrictions Precautions Precautions: Fall Recall of Precautions/Restrictions: Impaired Restrictions Weight Bearing Restrictions Per Provider Order: No     Mobility  Bed Mobility Overal bed mobility: Needs  Assistance Bed Mobility: Supine to Sit, Sit to Supine     Supine to sit: Contact guard Sit to supine: Contact guard assist   General bed mobility comments: Increased time, but able to execute without physical assist    Transfers Overall transfer level: Needs assistance Equipment used: Rolling walker (2 wheels) Transfers: Sit to/from Stand Sit to Stand: Mod assist, +2 physical assistance           General transfer comment: ModA + 2 to stand up to RW x 2 trials, with hand over hand guidance for placement. Left bias, with decreased weightbearing through R foot    Ambulation/Gait                   Stairs             Wheelchair Mobility     Tilt Bed    Modified Rankin (Stroke Patients Only)       Balance Overall balance assessment: Needs assistance Sitting-balance support: Feet supported Sitting balance-Leahy Scale: Fair                                      Hotel manager: Impaired Factors Affecting Communication: Difficulty expressing self  Cognition Arousal: Lethargic Behavior During Therapy: Flat affect   PT - Cognitive impairments: Orientation, Awareness, Memory, Attention, Initiation, Sequencing, Problem solving, Safety/Judgement                       PT - Cognition Comments: Increased verbalizations today although difficult to understand at times and hypophonic, slow to initiate movement Following commands: Impaired Following commands impaired: Follows  one step commands with increased time    Cueing Cueing Techniques: Verbal cues, Gestural cues, Tactile cues  Exercises      General Comments        Pertinent Vitals/Pain Pain Assessment Pain Assessment: Faces Faces Pain Scale: Hurts even more Pain Location: Plantar surface of foot Pain Descriptors / Indicators:  (strain) Pain Intervention(s): Limited activity within patient's tolerance, Monitored during session    Home Living                           Prior Function            PT Goals (current goals can now be found in the care plan section) Acute Rehab PT Goals Patient Stated Goal: get better Potential to Achieve Goals: Fair    Frequency    Min 3X/week      PT Plan      Co-evaluation              AM-PAC PT 6 Clicks Mobility   Outcome Measure  Help needed turning from your back to your side while in a flat bed without using bedrails?: A Little Help needed moving from lying on your back to sitting on the side of a flat bed without using bedrails?: A Little Help needed moving to and from a bed to a chair (including a wheelchair)?: A Lot Help needed standing up from a chair using your arms (e.g., wheelchair or bedside chair)?: A Lot Help needed to walk in hospital room?: Total Help needed climbing 3-5 steps with a railing? : Total 6 Click Score: 12    End of Session Equipment Utilized During Treatment: Gait belt Activity Tolerance: Other (comment) (limited by fatigue and R foot pain) Patient left: in bed;with call bell/phone within reach;with bed alarm set   PT Visit Diagnosis: Unsteadiness on feet (R26.81);Difficulty in walking, not elsewhere classified (R26.2)     Time: 0981-1914 PT Time Calculation (min) (ACUTE ONLY): 25 min  Charges:    $Therapeutic Activity: 23-37 mins PT General Charges $$ ACUTE PT VISIT: 1 Visit                     Verdia Glad, PT, DPT Acute Rehabilitation Services Office [phone]    Claria Crofts 06/16/2024, 4:12 PM

## 2024-06-16 NOTE — Evaluation (Addendum)
 Speech Language Pathology Evaluation Patient Details Name: Gabriela Moyer MRN: [id number] DOB: 04/10/1941 Today's Date: 06/16/2024 Time: 4098-1191 SLP Time Calculation (min) (ACUTE ONLY): 13 min  Problem List:  Patient Active Problem List   Diagnosis Date Noted   Headache, right-sided blurry vision and dizziness 06/06/2024   History of migraine 06/06/2024   PVD (peripheral vascular disease) (HCC) 06/06/2024   History of stent insertion of renal artery 06/06/2024   History of paroxysmal atrial tachycardia 06/06/2024   History of GI bleed 06/06/2024   Ischemic cerebrovascular accident (CVA) (HCC) 06/06/2024   Migraine headache 06/06/2024   AKI (acute kidney injury) (HCC) 06/06/2024   Afib (HCC) 12/18/2016   Atrial fibrillation with RVR (HCC) 12/18/2016   Ankle swelling 06/10/2015   Hyperlipidemia 04/28/2014   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 08/2016 Carotid U/S: 1-39% bilat ICA stenosis, >50 R ECA stenosis.   Diastolic dysfunction    a. 11/2016 Echo: EF 65-70%, Gr1 DD.   History of stress test    a. 12/2016 Lexiscan  MV: EF 73%, no ischemia/infarct.   Hyperlipidemia    Hypertension    PAF (paroxysmal atrial fibrillation) (HCC)    a. 11/2016 AF RVR in ED-->converted on IV dilt-->CHA2DS2VASc = 4-->Xarelto .   Renal artery stenosis (HCC)    a. 06/2009 s/p R Renal Artery PTA/stenting; b. 08/2016 Renal Duplex: nl LRA, >60% RRA stenosis- f/u 1 yr.   Subclavian arterial stenosis (HCC)    a. 08/2014 Upper Ext Duplex: R distal [place] & Ax - 70-99% diam reduction, L distal [place] & Ax - 50-69%--stable.   Past Surgical History:  Past Surgical History:  Procedure Laterality Date   CARDIAC CATHETERIZATION  11/03/2007    atherosclerotic right renal artery (Dr. Jammie Mccune)   Carotid Doppler  12/2010   R & L ICA with small amount fibrous plaque; R distal subclav. 70-99% diameter reduction; L mid subclav 50-69% diameter reduction   Lower Extremity Arterial Doppler  03/2008   right ABI - mild arterial insuff at rest; R CIA with less than 50% diameter reduction, R & L SFA with less than 50% diameter reduction   NM MYOCAR PERF WALL MOTION  09/2009   bruce myoview  - normal perfusion, EF 915, low risk scan   RENAL ANGIOPLASTY  11/07/2009   5x1mm Cordis [employer] Aviator stent to ostium of right renal artery (Dr. Aleda Ammon)   Renal Artery Doppler  04/2012   celiac artery with >50% diameter reduction; R renal artery stent w/mildly elevated velocities (1-59% diameter reduction)   TRANSTHORACIC ECHOCARDIOGRAM  08/28/2009   EF=>55%; trace TR; mild AV regurg, mild pulm valve regurg   Upper Extremity Arterial Doppler  04/2012   right brachial pressure , left ; R distal subclavian & axillary arteries 70-99% diameter reduction; L distal subclavian/axillary & brachial arteries (50-69% diameter reduction)   HPI:  Pt is an 83 y.o. female with who presented to the hospital on 06/06/24 after four days of severe headache (has migraine headaches at baseline) as well as vertigo and vision changes. CT head: 6 mm small hyperdense area adjacent to the right lateral ventricle  concerning for small brain lesion. Low-density area in the right occipital lobe could reflect infarct or edema related to underlying lesion. MRI  brain was positive for Leptomeningeal/pial-arachnoid enhancement in the posterior left frontal lobe and high left parietal lobe with findings most concerning for meningitis and possible focus of encephalitis in right occipital lobe. EEG showed status epileptius. LP conducted at bedside 6/14 due to multple failed fluoroscopy guided lumbar punctures. SLP eval 6/10: patient appears WNL for cognition, language and speech function.  MRI 6/14 Interval evolution of presumed early subacute right PCA  distribution infarct involving the right occipital lobe. SLP re-consulted 6/13 due to Difficulty reading comprehension with NIHSS and difficulty describing picture with mom washing dishes and swallow eval requested due to decreased alertness and mentation. 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 stent 2010   Assessment / Plan / Recommendation Clinical Impression  Since initial assessment 6/10 pt has had a subacute right PCA  distribution infarct involving the right occipital lobe on MRI 6/14 and uncertain if pt/son are aware of this (?). ST asked to reconsult due to difficulty with reading comprehension and description of picture. Today she presents with decreased cognition, delayed processing. She was oriented to place, month, yr and knows she came in with migraine but unable to state stroke (son says she has been told). She scored in mild impairment for 4 word recall and verbal problem solving was decreased. She did have one episode of transposing initial phoneme on 2 words but otherwise expressive language was functional and comprehension was adequate. She was able to describe picture scene in short sentences. She appears to have difficulty with vision that may correlate to occipital infarct. For reading comprehension she could only read several words of a paragraph but when read to her she was able to comprehend and answer question appropriately. Overall emergent awareness appears decreased. Pt in agreement to continue ST invervention to facilitate cognition.    SLP Assessment  SLP Recommendation/Assessment: Patient needs continued Speech Language Pathology Services SLP Visit Diagnosis: Cognitive communication deficit (R41.841)     Assistance Recommended at Discharge  Frequent or constant  Supervision/Assistance  Functional Status Assessment Patient has had a recent decline in their functional status and demonstrates the ability to make significant improvements in function in a reasonable and predictable amount of time.  Frequency and Duration min 2x/week  2 weeks      SLP Evaluation Cognition  Overall Cognitive Status: Impaired/Different from baseline Arousal/Alertness: Awake/alert Orientation Level: Oriented to person;Oriented to place;Oriented to time (oriented to admission for migraine but not sure pt has been told of potential brain mass or stroke seen on MRI 6/14) Year: 2025 Month: June Day of Week:  (did not know) Attention: Sustained Sustained Attention: Impaired Sustained Attention Impairment: Functional basic;Verbal basic Memory: Impaired Memory Impairment: Retrieval deficit;Storage deficit;Other (comment) (scored in mild deficit on Cognistat subtest) Awareness: Impaired Awareness Impairment: Emergent impairment Problem Solving: Impaired Safety/Judgment: Impaired       Comprehension  Auditory Comprehension Overall Auditory Comprehension: Appears within functional limits for tasks assessed Visual Recognition/Discrimination Discrimination: Not tested Reading Comprehension Reading Status:  (difficulty visualizing text)    Expression Expression Primary Mode of Expression: Verbal Verbal Expression Overall Verbal Expression: Appears within functional limits for tasks assessed (had one episode of transposed initial phoneme on 2 words) Initiation: No impairment Level of Generative/Spontaneous Verbalization: Sentence Repetition:  (NT) Naming: Not tested Pragmatics: No impairment Written Expression Written Expression: Not tested   Oral / Motor  Oral Motor/Sensory Function Overall Oral Motor/Sensory Function: Within functional limits Motor Speech  Overall Motor Speech: Appears within functional limits for tasks assessed Respiration: Within functional  limits Phonation: Normal Resonance: Within functional limits Articulation: Within functional limitis Intelligibility: Intelligible Motor Planning: Within functional limits Motor Speech Errors: Not applicable            [name] 06/16/2024, 11:05 AM

## 2024-06-16 NOTE — Progress Notes (Signed)
 Subjective: The patient is more alert today.  She is in no apparent distress.  Objective: Vital signs in last 24 hours: Temp:  [97.4 F (36.3 C)-98.4 F (36.9 C)] 98.3 F (36.8 C) (06/18 0820) Pulse Rate:  [65-72] 72 (06/18 0820) Resp:  [14-20] 18 (06/18 0820) BP: (104-119)/(48-95) 119/78 (06/18 0820) SpO2:  [96 %-98 %] 97 % (06/18 0820) Estimated body mass index is 29.84 kg/m as calculated from the following:   Height as of this encounter: 5' 2 (1.575 m).   Weight as of this encounter: 74 kg.   Intake/Output from previous day: No intake/output data recorded. Intake/Output this shift: Total I/O In: 100 [P.O.:100] Out: -   Physical exam the patient is alert and oriented x 2+, person, [facility], [place], June.  She is moving all 4 extremities well.  Lab Results: Recent Labs    06/15/24 0438 06/16/24 0457  WBC 7.9 8.1  HGB 13.7 14.4  HCT 41.1 43.5  PLT 195 170   BMET Recent Labs    06/15/24 0438 06/16/24 0457  NA 138 137  K 3.8 3.9  CL 105 105  CO2 25 23  GLUCOSE 90 89  BUN 25* 16  CREATININE 1.19* 1.09*  CALCIUM  9.0 8.9    Studies/Results: No results found.  Assessment/Plan: Abnormal brain MRI leptomeningeal enhancement: The patient seemed a bit better today.  I will ask neurology to weigh in as to whether they still strongly feel she needs a brain biopsy.  She is tentatively scheduled for right open brain biopsy tomorrow afternoon.  LOS: 10 days     Elder Greening 06/16/2024, 10:29 AM     Patient ID: Gabriela Moyer, female   DOB: April 08, 1941, 83 y.o.   MRN: [id number]

## 2024-06-16 NOTE — Progress Notes (Signed)
 Speech Language Pathology Treatment: Dysphagia  Patient Details Name: Gabriela Moyer MRN: [id number] DOB: 1941/05/07 Today's Date: 06/16/2024 Time: 4696-2952 SLP Time Calculation (min) (ACUTE ONLY): 8 min  Assessment / Plan / Recommendation Clinical Impression  Previous SLP recommended NPO with ice chips 6/16 due to lethargy. Pt's alertness has improved and MD ordered Dys 3/thin liquids. Pt is alert, slower processing observed with cup and straw sips without indications of decreased airway protection consistently. She masticated the cracker slowly however it was functional. RN tech stated she was able to masticate meat on tray this morning. Pt could upgrade to regular texture however pt and son wished to remain on Dys 3 where her meats are chopped. Meds as tolerated (whole in puree?). ST will follow up to determine if she would like to upgrade her texture and efficiency.    HPI HPI: Pt is an 83 y.o. female with who presented to the hospital on 06/06/24 after four days of severe headache (has migraine headaches at baseline) as well as vertigo and vision changes. CT head: 6 mm small hyperdense area adjacent to the right lateral ventricle  concerning for small brain lesion. Low-density area in the right occipital lobe could reflect infarct or edema related to underlying lesion. MRI brain was positive for Leptomeningeal/pial-arachnoid enhancement in the posterior left frontal lobe and high left parietal lobe with findings most concerning for meningitis and possible focus of encephalitis in right occipital lobe. EEG showed status epileptius. LP conducted at bedside 6/14 due to multple failed fluoroscopy guided lumbar punctures. SLP eval 6/10: patient appears WNL for cognition, language and speech function. SLP re-consulted 6/13 due to Difficulty reading comprehension with NIHSS and difficulty describing picture with mom washing dishes and swallow eval requested due to decreased alertness and mentation. PMH:  chronic migraine, essential HTN, HLD, hypothyroidism, paroxysmal afibrillation, extensive PAD of subclavian, axillary, brachial & renal artery s/p R renal artery stent 2010      SLP Plan  Continue with current plan of care          Recommendations  Diet recommendations: Dysphagia 3 (mechanical soft);Thin liquid Liquids provided via: Straw;Cup Medication Administration: Whole meds with puree Supervision: Staff to assist with self feeding;Full supervision/cueing for compensatory strategies Compensations: Slow rate;Small sips/bites Postural Changes and/or Swallow Maneuvers: Seated upright 90 degrees                  Oral care BID   Frequent or constant Supervision/Assistance Dysphagia, unspecified (R13.10)     Continue with current plan of care     Gabriela Moyer  06/16/2024, 10:27 AM

## 2024-06-16 NOTE — Progress Notes (Signed)
 Speech therapy at bedside.

## 2024-06-16 NOTE — Progress Notes (Signed)
 Inpatient Rehab Admissions Coordinator:   CIR following at a distance, open brain biopsy tentatively planned for tomorrow.   Wandalee Gust, MS, CCC-SLP Rehab Admissions Coordinator  [phone] (celll) [phone] (office)

## 2024-06-17 ENCOUNTER — Encounter (HOSPITAL_COMMUNITY)
Admission: EM | Disposition: A | Discharge status: Hospice | Payer: Self-pay | Source: Home / Self Care | Attending: Internal Medicine

## 2024-06-17 ENCOUNTER — Other Ambulatory Visit: Payer: Self-pay

## 2024-06-17 ENCOUNTER — Inpatient Hospital Stay (HOSPITAL_COMMUNITY): Payer: Self-pay

## 2024-06-17 ENCOUNTER — Inpatient Hospital Stay (HOSPITAL_COMMUNITY)

## 2024-06-17 DIAGNOSIS — R410 Disorientation, unspecified: Secondary | ICD-10-CM | POA: Diagnosis not present

## 2024-06-17 DIAGNOSIS — E785 Hyperlipidemia, unspecified: Secondary | ICD-10-CM | POA: Diagnosis not present

## 2024-06-17 DIAGNOSIS — E039 Hypothyroidism, unspecified: Secondary | ICD-10-CM

## 2024-06-17 DIAGNOSIS — I639 Cerebral infarction, unspecified: Secondary | ICD-10-CM | POA: Diagnosis not present

## 2024-06-17 DIAGNOSIS — I6350 Cerebral infarction due to unspecified occlusion or stenosis of unspecified cerebral artery: Secondary | ICD-10-CM | POA: Diagnosis not present

## 2024-06-17 DIAGNOSIS — R9402 Abnormal brain scan: Secondary | ICD-10-CM

## 2024-06-17 DIAGNOSIS — D496 Neoplasm of unspecified behavior of brain: Secondary | ICD-10-CM

## 2024-06-17 DIAGNOSIS — I10 Essential (primary) hypertension: Secondary | ICD-10-CM

## 2024-06-17 DIAGNOSIS — G939 Disorder of brain, unspecified: Secondary | ICD-10-CM | POA: Diagnosis not present

## 2024-06-17 DIAGNOSIS — I480 Paroxysmal atrial fibrillation: Secondary | ICD-10-CM | POA: Diagnosis not present

## 2024-06-17 HISTORY — PX: PR DURAL GRAFT SPINAL: 63710

## 2024-06-17 HISTORY — PX: APPLICATION OF CRANIAL NAVIGATION: SHX6578

## 2024-06-17 LAB — CBC
HCT: 41.7 % (ref 36.0–46.0)
Hemoglobin: 13.9 g/dL (ref 12.0–15.0)
MCH: 30.4 pg (ref 26.0–34.0)
MCHC: 33.3 g/dL (ref 30.0–36.0)
MCV: 91.2 fL (ref 80.0–100.0)
Platelets: 178 10*3/uL (ref 150–400)
RBC: 4.57 MIL/uL (ref 3.87–5.11)
RDW: 12.2 % (ref 11.5–15.5)
WBC: 9.9 10*3/uL (ref 4.0–10.5)
nRBC: 0 % (ref 0.0–0.2)

## 2024-06-17 LAB — COMPREHENSIVE METABOLIC PANEL WITH GFR
ALT: 39 U/L (ref 0–44)
AST: 61 U/L — ABNORMAL HIGH (ref 15–41)
Albumin: 2.6 g/dL — ABNORMAL LOW (ref 3.5–5.0)
Alkaline Phosphatase: 184 U/L — ABNORMAL HIGH (ref 38–126)
Anion gap: 7 (ref 5–15)
BUN: 13 mg/dL (ref 8–23)
CO2: 25 mmol/L (ref 22–32)
Calcium: 9.2 mg/dL (ref 8.9–10.3)
Chloride: 106 mmol/L (ref 98–111)
Creatinine, Ser: 1 mg/dL (ref 0.44–1.00)
GFR, Estimated: 56 mL/min — ABNORMAL LOW (ref >60)
Glucose, Bld: 128 mg/dL — ABNORMAL HIGH (ref 70–99)
Potassium: 4.1 mmol/L (ref 3.5–5.1)
Sodium: 138 mmol/L (ref 135–145)
Total Bilirubin: 0.5 mg/dL (ref 0.0–1.2)
Total Protein: 5.6 g/dL — ABNORMAL LOW (ref 6.5–8.1)

## 2024-06-17 LAB — TYPE AND SCREEN
ABO/RH(D): A NEG
Antibody Screen: NEGATIVE

## 2024-06-17 LAB — ABO/RH: ABO/RH(D): A NEG

## 2024-06-17 LAB — MAGNESIUM: Magnesium: 1.9 mg/dL (ref 1.7–2.4)

## 2024-06-17 SURGERY — FRAMELESS STEREOTACTIC BIOPSY
Anesthesia: General

## 2024-06-17 MED ORDER — THROMBIN 5000 UNITS EX SOLR
OROMUCOSAL | Status: DC | PRN
  Administered 2024-06-17: 5 mL via TOPICAL

## 2024-06-17 MED ORDER — DIVALPROEX SODIUM 250 MG PO DR TAB
750.0000 mg | DELAYED_RELEASE_TABLET | Freq: Two times a day (BID) | ORAL | Status: DC
  Administered 2024-06-19 – 2024-06-25 (×13): 750 mg via ORAL
  Filled 2024-06-17 (×9): qty 3
  Filled 2024-06-17: qty 1
  Filled 2024-06-17 (×3): qty 3
  Filled 2024-06-17: qty 1
  Filled 2024-06-17: qty 3
  Filled 2024-06-17: qty 1
  Filled 2024-06-17 (×2): qty 3

## 2024-06-17 MED ORDER — HYDROCODONE-ACETAMINOPHEN 5-325 MG PO TABS
1.0000 | ORAL_TABLET | ORAL | Status: DC | PRN
  Administered 2024-06-18 (×2): 1 via ORAL
  Filled 2024-06-17 (×2): qty 1

## 2024-06-17 MED ORDER — DEXAMETHASONE SODIUM PHOSPHATE 10 MG/ML IJ SOLN
INTRAMUSCULAR | Status: AC
  Filled 2024-06-17: qty 1

## 2024-06-17 MED ORDER — CEFAZOLIN SODIUM-DEXTROSE 1-4 GM/50ML-% IV SOLN
1.0000 g | Freq: Two times a day (BID) | INTRAVENOUS | Status: AC
  Administered 2024-06-17 – 2024-06-18 (×2): 1 g via INTRAVENOUS
  Filled 2024-06-17 (×4): qty 50

## 2024-06-17 MED ORDER — CHLORHEXIDINE GLUCONATE 0.12 % MT SOLN
15.0000 mL | Freq: Once | OROMUCOSAL | Status: AC
  Administered 2024-06-17: 15 mL via OROMUCOSAL
  Filled 2024-06-17: qty 15

## 2024-06-17 MED ORDER — PHENYLEPHRINE HCL-NACL 20-0.9 MG/250ML-% IV SOLN
INTRAVENOUS | Status: DC | PRN
  Administered 2024-06-17: 40 ug/min via INTRAVENOUS

## 2024-06-17 MED ORDER — ONDANSETRON HCL 4 MG/2ML IJ SOLN
INTRAMUSCULAR | Status: DC | PRN
  Administered 2024-06-17: 4 mg via INTRAVENOUS

## 2024-06-17 MED ORDER — SODIUM CHLORIDE 0.9 % IV SOLN: INTRAVENOUS | Status: DC

## 2024-06-17 MED ORDER — ACETAMINOPHEN 10 MG/ML IV SOLN
INTRAVENOUS | Status: AC
  Filled 2024-06-17: qty 100

## 2024-06-17 MED ORDER — DEXAMETHASONE SODIUM PHOSPHATE 4 MG/ML IJ SOLN
4.0000 mg | Freq: Four times a day (QID) | INTRAMUSCULAR | Status: AC
  Administered 2024-06-18 – 2024-06-19 (×4): 4 mg via INTRAVENOUS
  Filled 2024-06-17 (×4): qty 1

## 2024-06-17 MED ORDER — LEVETIRACETAM 500 MG/5ML IV SOLN
INTRAVENOUS | Status: AC
  Filled 2024-06-17: qty 15

## 2024-06-17 MED ORDER — BACITRACIN ZINC 500 UNIT/GM EX OINT
TOPICAL_OINTMENT | CUTANEOUS | Status: DC | PRN
  Administered 2024-06-17: 1 via TOPICAL

## 2024-06-17 MED ORDER — ROCURONIUM BROMIDE 10 MG/ML (PF) SYRINGE
PREFILLED_SYRINGE | INTRAVENOUS | Status: DC | PRN
  Administered 2024-06-17: 10 mg via INTRAVENOUS
  Administered 2024-06-17: 20 mg via INTRAVENOUS
  Administered 2024-06-17: 40 mg via INTRAVENOUS

## 2024-06-17 MED ORDER — DEXAMETHASONE SODIUM PHOSPHATE 10 MG/ML IJ SOLN
6.0000 mg | Freq: Four times a day (QID) | INTRAMUSCULAR | Status: AC
  Administered 2024-06-17 – 2024-06-18 (×4): 6 mg via INTRAVENOUS
  Filled 2024-06-17 (×4): qty 1

## 2024-06-17 MED ORDER — ESMOLOL HCL 100 MG/10ML IV SOLN
INTRAVENOUS | Status: AC
Start: 2024-06-17 — End: 2024-06-17
  Filled 2024-06-17: qty 10

## 2024-06-17 MED ORDER — SODIUM CHLORIDE 0.9 % IV SOLN
INTRAVENOUS | Status: DC | PRN
  Administered 2024-06-17: 1500 mg via INTRAVENOUS

## 2024-06-17 MED ORDER — PROMETHAZINE HCL 25 MG PO TABS: 12.5000 mg | ORAL_TABLET | ORAL | Status: DC | PRN

## 2024-06-17 MED ORDER — FENTANYL CITRATE (PF) 100 MCG/2ML IJ SOLN: 25.0000 ug | INTRAMUSCULAR | Status: DC | PRN

## 2024-06-17 MED ORDER — ROCURONIUM BROMIDE 10 MG/ML (PF) SYRINGE
PREFILLED_SYRINGE | INTRAVENOUS | Status: AC
  Filled 2024-06-17: qty 10

## 2024-06-17 MED ORDER — ORAL CARE MOUTH RINSE: 15.0000 mL | Freq: Once | OROMUCOSAL | Status: AC

## 2024-06-17 MED ORDER — CEFADROXIL 500 MG PO CAPS
500.0000 mg | ORAL_CAPSULE | Freq: Two times a day (BID) | ORAL | Status: AC
  Administered 2024-06-18 – 2024-06-19 (×2): 500 mg via ORAL
  Filled 2024-06-17 (×4): qty 1

## 2024-06-17 MED ORDER — AMISULPRIDE (ANTIEMETIC) 5 MG/2ML IV SOLN: 10.0000 mg | Freq: Once | INTRAVENOUS | Status: DC | PRN

## 2024-06-17 MED ORDER — LIDOCAINE 2% (20 MG/ML) 5 ML SYRINGE
INTRAMUSCULAR | Status: DC | PRN
  Administered 2024-06-17: 60 mg via INTRAVENOUS

## 2024-06-17 MED ORDER — VALPROATE SODIUM 100 MG/ML IV SOLN
750.0000 mg | Freq: Two times a day (BID) | INTRAVENOUS | Status: DC
  Administered 2024-06-17 – 2024-06-27 (×6): 750 mg via INTRAVENOUS
  Filled 2024-06-17 (×14): qty 7.5

## 2024-06-17 MED ORDER — ONDANSETRON HCL 4 MG/2ML IJ SOLN
INTRAMUSCULAR | Status: AC
Start: 2024-06-17 — End: 2024-06-17
  Filled 2024-06-17: qty 2

## 2024-06-17 MED ORDER — ACETAMINOPHEN 10 MG/ML IV SOLN
1000.0000 mg | Freq: Once | INTRAVENOUS | Status: AC
  Administered 2024-06-17: 1000 mg via INTRAVENOUS
  Filled 2024-06-17: qty 100

## 2024-06-17 MED ORDER — EPHEDRINE 5 MG/ML INJ
INTRAVENOUS | Status: AC
  Filled 2024-06-17: qty 5

## 2024-06-17 MED ORDER — PHENYLEPHRINE 80 MCG/ML (10ML) SYRINGE FOR IV PUSH (FOR BLOOD PRESSURE SUPPORT)
PREFILLED_SYRINGE | INTRAVENOUS | Status: DC | PRN
  Administered 2024-06-17: 160 ug via INTRAVENOUS

## 2024-06-17 MED ORDER — CEFAZOLIN SODIUM-DEXTROSE 2-3 GM-%(50ML) IV SOLR
INTRAVENOUS | Status: DC | PRN
  Administered 2024-06-17: 2 g via INTRAVENOUS

## 2024-06-17 MED ORDER — EPHEDRINE SULFATE-NACL 50-0.9 MG/10ML-% IV SOSY
PREFILLED_SYRINGE | INTRAVENOUS | Status: DC | PRN
  Administered 2024-06-17: 5 mg via INTRAVENOUS

## 2024-06-17 MED ORDER — LABETALOL HCL 5 MG/ML IV SOLN
10.0000 mg | INTRAVENOUS | Status: DC | PRN
  Administered 2024-06-17 (×2): 10 mg via INTRAVENOUS
  Filled 2024-06-17: qty 4

## 2024-06-17 MED ORDER — ONDANSETRON HCL 4 MG/2ML IJ SOLN: 4.0000 mg | INTRAMUSCULAR | Status: DC | PRN

## 2024-06-17 MED ORDER — ONDANSETRON HCL 4 MG PO TABS: 4.0000 mg | ORAL_TABLET | ORAL | Status: DC | PRN

## 2024-06-17 MED ORDER — SUGAMMADEX SODIUM 200 MG/2ML IV SOLN
INTRAVENOUS | Status: DC | PRN
  Administered 2024-06-17: 281.2 mg via INTRAVENOUS

## 2024-06-17 MED ORDER — SODIUM CHLORIDE 0.9 % IV SOLN
0.1500 ug/kg/min | Freq: Once | INTRAVENOUS | Status: AC
  Administered 2024-06-17: .05 ug/kg/min via INTRAVENOUS
  Filled 2024-06-17: qty 2000

## 2024-06-17 MED ORDER — PROPOFOL 10 MG/ML IV BOLUS
INTRAVENOUS | Status: AC
  Filled 2024-06-17: qty 20

## 2024-06-17 MED ORDER — DEXAMETHASONE SODIUM PHOSPHATE 4 MG/ML IJ SOLN
4.0000 mg | Freq: Three times a day (TID) | INTRAMUSCULAR | Status: DC
  Administered 2024-06-19 – 2024-06-21 (×5): 4 mg via INTRAVENOUS
  Filled 2024-06-17 (×5): qty 1

## 2024-06-17 MED ORDER — PHENYLEPHRINE 80 MCG/ML (10ML) SYRINGE FOR IV PUSH (FOR BLOOD PRESSURE SUPPORT)
PREFILLED_SYRINGE | INTRAVENOUS | Status: AC
Start: 2024-06-17 — End: 2024-06-17
  Filled 2024-06-17: qty 10

## 2024-06-17 MED ORDER — LACTATED RINGERS IV SOLN: INTRAVENOUS | Status: DC | PRN

## 2024-06-17 MED ORDER — CHLORHEXIDINE GLUCONATE CLOTH 2 % EX PADS
6.0000 | MEDICATED_PAD | Freq: Every day | CUTANEOUS | Status: DC
  Administered 2024-06-17 – 2024-06-18 (×2): 6 via TOPICAL

## 2024-06-17 MED ORDER — 0.9 % SODIUM CHLORIDE (POUR BTL) OPTIME
TOPICAL | Status: DC | PRN
  Administered 2024-06-17: 3000 mL

## 2024-06-17 MED ORDER — PROPOFOL 10 MG/ML IV BOLUS
INTRAVENOUS | Status: DC | PRN
  Administered 2024-06-17: 60 mg via INTRAVENOUS
  Administered 2024-06-17: 20 mg via INTRAVENOUS

## 2024-06-17 MED ORDER — CLEVIDIPINE BUTYRATE 0.5 MG/ML IV EMUL
INTRAVENOUS | Status: AC
  Filled 2024-06-17: qty 50

## 2024-06-17 MED ORDER — LIDOCAINE 2% (20 MG/ML) 5 ML SYRINGE
INTRAMUSCULAR | Status: AC
  Filled 2024-06-17: qty 5

## 2024-06-17 MED ORDER — BUPIVACAINE-EPINEPHRINE 0.5% -1:200000 IJ SOLN
INTRAMUSCULAR | Status: DC | PRN
  Administered 2024-06-17: 10 mL

## 2024-06-17 SURGICAL SUPPLY — 40 items
BAG COUNTER SPONGE SURGICOUNT (BAG) ×2 IMPLANT
BLADE CLIPPER SURG (BLADE) IMPLANT
BNDG ADH 1X3 SHEER STRL LF (GAUZE/BANDAGES/DRESSINGS) IMPLANT
BUR PRECISION FLUTE 6.0 (BURR) ×2 IMPLANT
CANISTER SUCTION 3000ML PPV (SUCTIONS) ×2 IMPLANT
CNTNR URN SCR LID CUP LEK RST (MISCELLANEOUS) ×2 IMPLANT
COVERAGE SUPPORT O-ARM STEALTH (MISCELLANEOUS) ×1 IMPLANT
DRAPE NEUROLOGICAL W/INCISE (DRAPES) IMPLANT
DRSG OPSITE 4X5.5 SM (GAUZE/BANDAGES/DRESSINGS) IMPLANT
DURAPREP 26ML APPLICATOR (WOUND CARE) IMPLANT
ELECTRODE REM PT RTRN 9FT ADLT (ELECTROSURGICAL) ×2 IMPLANT
FEE COVERAGE SUPPORT O-ARM (MISCELLANEOUS) ×2 IMPLANT
GAUZE 4X4 16PLY ~~LOC~~+RFID DBL (SPONGE) IMPLANT
GLOVE BIO SURGEON STRL SZ8 (GLOVE) ×4 IMPLANT
GLOVE BIO SURGEON STRL SZ8.5 (GLOVE) ×4 IMPLANT
GLOVE EXAM NITRILE XL STR (GLOVE) IMPLANT
GOWN STRL REUS W/ TWL LRG LVL3 (GOWN DISPOSABLE) IMPLANT
GOWN STRL REUS W/ TWL XL LVL3 (GOWN DISPOSABLE) IMPLANT
HEMOSTAT POWDER KIT SURGIFOAM (HEMOSTASIS) ×2 IMPLANT
KIT BASIN OR (CUSTOM PROCEDURE TRAY) ×2 IMPLANT
KIT TURNOVER KIT B (KITS) ×2 IMPLANT
MARKER SPHERE PSV REFLC NDI (MISCELLANEOUS) IMPLANT
NDL HYPO 25X1 1.5 SAFETY (NEEDLE) ×2 IMPLANT
NEEDLE HYPO 25X1 1.5 SAFETY (NEEDLE) ×1 IMPLANT
NS IRRIG 1000ML POUR BTL (IV SOLUTION) ×2 IMPLANT
PACK LAMINECTOMY NEURO (CUSTOM PROCEDURE TRAY) ×2 IMPLANT
PAD ARMBOARD POSITIONER FOAM (MISCELLANEOUS) ×6 IMPLANT
PATTIES SURGICAL .25X.25 (GAUZE/BANDAGES/DRESSINGS) IMPLANT
PATTIES SURGICAL .5 X.5 (GAUZE/BANDAGES/DRESSINGS) ×4 IMPLANT
PATTIES SURGICAL 1X1 (DISPOSABLE) ×4 IMPLANT
PIN MAYFIELD SKULL DISP (PIN) ×2 IMPLANT
SPONGE SURGIFOAM ABS GEL SZ50 (HEMOSTASIS) IMPLANT
STAPLER SKIN PROX 35W (STAPLE) ×2 IMPLANT
SUT VIC AB 2-0 CP2 18 (SUTURE) ×2 IMPLANT
SYR 30ML SLIP (SYRINGE) ×4 IMPLANT
SYR 5ML LL (SYRINGE) ×4 IMPLANT
SYR 5ML LUER SLIP (SYRINGE) ×4 IMPLANT
TOWEL GREEN STERILE (TOWEL DISPOSABLE) ×2 IMPLANT
TOWEL GREEN STERILE FF (TOWEL DISPOSABLE) ×2 IMPLANT
WATER STERILE IRR 1000ML POUR (IV SOLUTION) ×2 IMPLANT

## 2024-06-17 NOTE — Progress Notes (Signed)
 OT Cancellation Note  Patient Details Name: Gabriela Moyer MRN: [id number] DOB: 18-Mar-1941   Cancelled Treatment:    Reason Eval/Treat Not Completed: Patient at procedure or test/ unavailable (Pt off unit at procedure, OT will follow-up with pt as able)  06/17/2024  AB, OTR/L  Acute Rehabilitation Services  Office: [phone]   [name] 06/17/2024, 10:50 AM

## 2024-06-17 NOTE — Anesthesia Procedure Notes (Signed)
 Arterial Line Insertion Start/End6/19/2025 9:50 AM, 06/17/2024 10:00 AM Performed by: Alphia Jasmine, CRNA, CRNA  Patient location: Pre-op. Preanesthetic checklist: patient identified, IV checked, site marked, risks and benefits discussed, surgical consent, monitors and equipment checked, pre-op evaluation, timeout performed and anesthesia consent Lidocaine  1% used for infiltration Left, radial was placed Catheter size: 20 G Hand hygiene performed  and maximum sterile barriers used  Allen's test indicative of satisfactory collateral circulation Attempts: 1 Procedure performed without using ultrasound guided technique. Following insertion, dressing applied and Biopatch. Post procedure assessment: normal  Patient tolerated the procedure well with no immediate complications.

## 2024-06-17 NOTE — Progress Notes (Signed)
 Report given to PACU nurse and transport is in the room for transport to procedure. Family at bedside and signing consent.

## 2024-06-17 NOTE — Progress Notes (Addendum)
 PROGRESS NOTE  Gabriela Moyer ZOX:[id number] DOB: [birth year] DOA: 06/06/2024 PCP: Suzzanne Estrin, MD   LOS: 11 days   Brief Narrative / Interim history: Gabriela Moyer is a 83 y.o. female with medical history significant of chronic migraine, essential hypertension, hyperlipidemia, extensive  PAD of subclavian, axillary, brachial and renal artery status post right renal artery stent 2010, hypothyroidism and paroxysmal afibrillation not on Xarelto  due to history of GI bleed recent emergency department complaining of headache for 4 days which is right-sided with associated vertigo, some vision change with the onset of the headache. EEG with focal seizures.  MRI with leptomeningeal/pial-arachnoid enhancement in posterior L frontal lobe and high left parietal lobe.  Workup ongoing with assistance of neurology.  Underwent LP, neurosurgery planning open biopsy on 6/19  Subjective / 24h Interval events: Slightly more sleepy this morning, but opens her eyes when prompted  Assesement and Plan: Principal Problem:   Ischemic cerebrovascular accident (CVA) (HCC) Active Problems:   Headache, right-sided blurry vision and dizziness   Migraine headache   AKI (acute kidney injury) (HCC)   Essential hypertension   Hyperlipidemia   History of migraine   PVD (peripheral vascular disease) (HCC)   History of stent insertion of renal artery   History of paroxysmal atrial tachycardia   History of GI bleed   Principal problem Acute CVA, Leptomeinigeal/pial-arachnoid enhancement in the posterior L frontal lobe and high left parietal lobe, Focal seizures  -patient initially presented to the hospital with migraines, headache, right visual field cut.  She underwent an MRI of the brain which showed leptomeningeal/pial-arachnoid enhancement in the posterior L frontal lobe and high left parietal lobe corresponding to an area of abnormality on CT - additional region of infarct in the R occipital lobe with associated  leptomeningeal/pial arachnoid enhancement.  Differential is broad including CNS lymphoma, vasculitis, leptomeningeal carcinomatosis, TB.  Recent EEG without definitive seizures, now discontinued currently on Keppra  1500 mg BID and Depakote  750 mg Q12 - LP fairly unremarkable, AFB smear negative, glucose was normal, 1 WBC, 2 RBC, negative crypto antigen, negative meningitis/encephalitis panel.  Negative quant gold, negative RF, negative aldolase, ANCA negative, CRP, C3/C4 wnl, Scl-70 negative, anti CCP wnl, sed rate wnl, ENA negative, anti-jo 1 wnl.  Cytology (no malignant cells).  - Neurosurgery also following, planning for open biopsy today 6/19  Active problems Goals of Care -DNR   Acute Metabolic Encephalopathy -Mental status is fluctuating . MRI showed interval evolution of early subacute R PCA distribution infarct.  No significant interval vhange in appearance of leptomeningeal enhancement within the high posterior L frontoparietal region.  Subtly increased patchy flair signal abnormality with few foci of enhancement involving the L occipital lobe.  Suspect related to above process - unclear what is causing rapid worsening. Delirium precautions -  EEG 6/16 with periodic discharges with triphasic morphology - moderate diffuse encephalopathy - no seizures noted  Possible UTI -unclear symptoms given confusion, but favor treating.  Microbiologist.  Saline pneumonia, currently on cefadroxil based on sensitivities with 2 additional days remaining  Hypokalemia - K now normalized.  Continue to monitor and replace as indicated  Hypothyroidism - TSH high at 17, suggesting synthroid  underdosed.  Synthroid  increased to 50 mcg, repeat follow-up should be done in 4 weeks   Acute migraine headache with associated nausea - Continue Fioricet  as needed. Tigan  for nausea.    Paroxysmal Afib - RVR resolved. Decreased home dose Toprol  to xl 25 daily. Not on anticoagulation due to gi bleed. Continue  telemetry  monitoring.   QT prolongation -Avoid QT prolonging drugs. Continue Tigan  as needed for nausea.   Hypotension - Orthostatic vitals positive. Low dose Toprol  xl 25 daily as she has Afib. Monitor vitals closely.  Scheduled Meds:  [MAR Hold] atorvastatin   40 mg Oral Daily   [MAR Hold] cefadroxil  500 mg Oral BID   [MAR Hold] vitamin B-12  1,000 mcg Oral Daily   [MAR Hold] divalproex   750 mg Oral Q12H   [MAR Hold] famotidine  40 mg Oral Daily   [MAR Hold] levETIRAcetam   1,500 mg Intravenous Q12H   Or   [MAR Hold] levETIRAcetam   1,500 mg Oral Q12H   [MAR Hold] levothyroxine   50 mcg Oral Daily   [MAR Hold] metoprolol  succinate  25 mg Oral Daily   [MAR Hold] potassium chloride   20 mEq Oral Daily   remifentanil (ULTIVA) 2 mg in 100 mL normal saline (20 mcg/mL) Optime  0.15 mcg/kg/min (Ideal) Intravenous Once   [MAR Hold] sodium chloride  flush  3-10 mL Intravenous Q12H   Continuous Infusions:  sodium chloride  10 mL/hr at 06/17/24 0934   [MAR Hold] sodium chloride  Stopped (06/07/24 1418)   PRN Meds:.[MAR Hold] acetaminophen  **OR** [MAR Hold] acetaminophen  (TYLENOL ) oral liquid 160 mg/5 mL **OR** [MAR Hold] acetaminophen , [MAR Hold] butalbital -acetaminophen -caffeine , [MAR Hold] diphenhydrAMINE , [MAR Hold] senna-docusate, [MAR Hold] sodium chloride  flush, [MAR Hold] trimethobenzamide   Current Outpatient Medications  Medication Instructions   amLODipine  (NORVASC ) 5 mg, Oral, Daily   clopidogrel  (PLAVIX ) 75 mg, Oral, Daily   ezetimibe  (ZETIA ) 10 MG tablet TAKE 1 TABLET(10 MG) BY MOUTH DAILY   hydrochlorothiazide  (HYDRODIURIL ) 25 MG tablet TAKE 1 TABLET(25 MG) BY MOUTH DAILY   levothyroxine  (SYNTHROID , LEVOTHROID) 25 MCG tablet 1 tablet, Daily   metoprolol  succinate (TOPROL -XL) 100 MG 24 hr tablet TAKE 1 TABLET(100 MG) BY MOUTH DAILY   metoprolol  tartrate (LOPRESSOR ) 50 MG tablet Take 2 hours prior to procedure   pravastatin  (PRAVACHOL ) 40 mg, Oral, Every evening   rosuvastatin (CRESTOR) 5 mg,  Oral, Daily PRN    Diet Orders (From admission, onward)     Start     Ordered   06/17/24 0001  Diet NPO time specified Except for: Sips with Meds  Diet effective midnight       Question:  Except for  Answer:  Sips with Meds   06/16/24 1856            DVT prophylaxis: Place and maintain sequential compression device Start: 06/12/24 1633 Place and maintain sequential compression device Start: 06/07/24 1849 Place TED hose Start: 06/07/24 1849   Lab Results  Component Value Date   PLT 178 06/17/2024      Code Status: Limited: Do not attempt resuscitation (DNR) -DNR-LIMITED -Do Not Intubate/DNI   Family Communication: Daughter-in-law present at bedside  Status is: Inpatient Remains inpatient appropriate because: severity of illness  Level of care: Telemetry Medical  Consultants:  Neurology Neurosurgery   Objective: Vitals:   06/17/24 0050 06/17/24 0433 06/17/24 0734 06/17/24 0850  BP: 104/63 (!) 144/67 125/67 (!) 141/48  Pulse: 79 74 72 72  Resp: 16 20 16 18   Temp: 97.7 F (36.5 C) (!) 97.2 F (36.2 C) 98.2 F (36.8 C) 97.9 F (36.6 C)  TempSrc: Oral Oral Oral Oral  SpO2: 97% 96% 94% 95%  Weight:    70.3 kg  Height:    5' 2 (1.575 m)    Intake/Output Summary (Last 24 hours) at 06/17/2024 0941 Last data filed at 06/17/2024 0600 Gross per  24 hour  Intake 100 ml  Output 252 ml  Net -152 ml   Wt Readings from Last 3 Encounters:  06/17/24 70.3 kg  07/02/23 74 kg  07/03/22 77.6 kg    Examination:  Constitutional: NAD Eyes: lids and conjunctivae normal, no scleral icterus ENMT: mmm Neck: normal, supple Respiratory: clear to auscultation bilaterally, no wheezing, no crackles. Normal respiratory effort.  Cardiovascular: Regular rate and rhythm, no murmurs / rubs / gallops. No LE edema. Abdomen: soft, no distention, no tenderness. Bowel sounds positive.   Data Reviewed: I have independently reviewed following labs and imaging studies   CBC Recent Labs   Lab 06/13/24 1151 06/14/24 0443 06/15/24 0438 06/16/24 0457 06/17/24 0442  WBC 9.6 11.4* 7.9 8.1 9.9  HGB 14.7 14.6 13.7 14.4 13.9  HCT 43.6 43.1 41.1 43.5 41.7  PLT 144* 235 195 170 178  MCV 89.7 90.4 91.5 92.6 91.2  MCH 30.2 30.6 30.5 30.6 30.4  MCHC 33.7 33.9 33.3 33.1 33.3  RDW 12.0 12.0 12.0 12.0 12.2  LYMPHSABS  --  1.2 1.1 1.1  --   MONOABS  --  0.8 0.9 1.1*  --   EOSABS  --  0.1 0.1 0.1  --   BASOSABS  --  0.0 0.0 0.0  --     Recent Labs  Lab 06/11/24 1108 06/12/24 0556 06/13/24 1151 06/14/24 0443 06/15/24 0438 06/16/24 0457 06/17/24 0442  NA  --  142 138 139 138 137 138  K  --  4.5 4.2 3.8 3.8 3.9 4.1  CL  --  105 100 101 105 105 106  CO2  --  26 25 25 25 23 25   GLUCOSE  --  90 98 79 90 89 128*  BUN  --  20 17 22  25* 16 13  CREATININE  --  1.08* 0.98 1.14* 1.19* 1.09* 1.00  CALCIUM   --  9.5 9.6 9.2 9.0 8.9 9.2  AST  --   --   --  48* 49* 52* 61*  ALT  --   --   --  35 35 35 39  ALKPHOS  --   --   --  163* 146* 164* 184*  BILITOT  --   --   --  0.7 0.6 0.6 0.5  ALBUMIN  --   --   --  2.9* 2.7* 2.7* 2.6*  MG  --   --   --  1.5* 2.3 2.1 1.9  AMMONIA 71* 46* 32  --   --   --   --     ------------------------------------------------------------------------------------------------------------------ No results for input(s): CHOL, HDL, LDLCALC, TRIG, CHOLHDL, LDLDIRECT in the last 72 hours.  Lab Results  Component Value Date   HGBA1C 5.5 06/07/2024   ------------------------------------------------------------------------------------------------------------------ No results for input(s): TSH, T4TOTAL, T3FREE, THYROIDAB in the last 72 hours.  Invalid input(s): FREET3  Cardiac Enzymes No results for input(s): CKMB, TROPONINI, MYOGLOBIN in the last 168 hours.  Invalid input(s): CK ------------------------------------------------------------------------------------------------------------------ No results found for:  BNP  CBG: Recent Labs  Lab 06/13/24 0049 06/13/24 1004  GLUCAP 78 90    Recent Results (from the past 240 hours)  Acid Fast Smear (AFB)     Status: None   Collection Time: 06/12/24  9:36 AM   Specimen: CSF  Result Value Ref Range Status   AFB Specimen Processing Concentration  Final   Acid Fast Smear Negative  Final    Comment: (NOTE) Performed At: [facility] [address] [place], [place] [id number] Madalyn Scarce  Laymond Priestly MD [id number]    Source (AFB) CSF  Final    Comment: Performed at [facility], [address] [place], [place] [address]  CSF culture w Gram Stain     Status: None   Collection Time: 06/12/24  1:10 PM   Specimen: CSF; Cerebrospinal Fluid  Result Value Ref Range Status   Specimen Description CSF  Final   Special Requests NONE  Final   Gram Stain WBC SEEN NO ORGANISMS SEEN CYTOSPIN SMEAR   Final   Culture   Final    NO GROWTH 3 DAYS Performed at [facility], [address], [place], [place] [address]    Report Status 06/15/2024 FINAL  Final  MT-RIF NAA W Cult, Non-Sputum     Status: None (Preliminary result)   Collection Time: 06/12/24  1:10 PM   Specimen: CSF; Cerebrospinal Fluid  Result Value Ref Range Status   Source CSF  Final    Comment: Performed at [facility], [address], [place], [place] [address]   Specimen Source Comment  Final    Comment: Cerebrospinal fluid (CSF)   M tuberculosis complex NOT DETECTED NOT DETECTED Final    Comment: (NOTE) This test was developed and its performance characteristics determined by [facility]. It has not been cleared or approved by the Food and Drug Administration.    Rifampin Not applicable NOT DETECTED Final    Comment: (NOTE) This test was developed and its performance characteristics determined by [facility]. It has not been cleared or approved by the Food and Drug Administration.    AFB Specimen Processing Concentration  Final    Comment: (NOTE) Performed At: [facility] [address] [place], [place] [id number] Pearlean Botts MD [id number]    Acid Fast Culture PENDING  Incomplete  Urine Culture (for pregnant, neutropenic or urologic patients or patients with an indwelling urinary catheter)     Status: Abnormal   Collection Time: 06/13/24  5:35 PM   Specimen: Urine, Clean Catch  Result Value Ref Range Status   Specimen Description URINE, CLEAN CATCH  Final   Special Requests   Final    NONE Performed at [facility], [address], [place], [place] [address]    Culture >=100,000 COLONIES/mL KLEBSIELLA PNEUMONIAE (A)  Final   Report Status 06/15/2024 FINAL  Final   Organism ID, Bacteria KLEBSIELLA PNEUMONIAE (A)  Final      Susceptibility   Klebsiella pneumoniae - MIC*    AMPICILLIN >=32 RESISTANT Resistant     CEFAZOLIN <=4 SENSITIVE Sensitive     CEFEPIME <=0.12 SENSITIVE Sensitive     CEFTRIAXONE <=0.25 SENSITIVE Sensitive     CIPROFLOXACIN <=0.25 SENSITIVE Sensitive     GENTAMICIN <=1 SENSITIVE Sensitive     IMIPENEM 1 SENSITIVE Sensitive     NITROFURANTOIN 64 INTERMEDIATE Intermediate     TRIMETH/SULFA <=20 SENSITIVE Sensitive     AMPICILLIN/SULBACTAM 16 INTERMEDIATE Intermediate     PIP/TAZO 8 SENSITIVE Sensitive ug/mL    * >=100,000 COLONIES/mL KLEBSIELLA PNEUMONIAE     Radiology Studies: No results found.   Kathlen Para, MD, PhD [facility]  Between 7 am - 7 pm I am available, please contact me via Amion (for emergencies) or Securechat (non urgent messages)  Between 7 pm - 7 am I am not available, please contact night coverage MD/APP via Amion

## 2024-06-17 NOTE — Progress Notes (Signed)
 NEUROLOGY CONSULT FOLLOW UP NOTE   Date of service: June 17, 2024 Patient Name: Gabriela Moyer MRN:  [id number] DOB:  1941-04-29  Interval Hx/subjective  Brain biopsy done. Patient seen post op in ICU. She is drowsy but opens eyes to voice. Will answer questions with one word, following simple commands.  Consider spot EEG if she doesn't start waking up more.   Vitals   Vitals:   06/16/24 2115 06/17/24 0050 06/17/24 0433 06/17/24 0734  BP: (!) 107/42 104/63 (!) 144/67 125/67  Pulse:  79 74 72  Resp:  16 20 16   Temp:  97.7 F (36.5 C) (!) 97.2 F (36.2 C) 98.2 F (36.8 C)  TempSrc:  Oral Oral Oral  SpO2:  97% 96% 94%  Weight:      Height:         Body mass index is 29.84 kg/m.  Physical Exam   Constitutional: Appears well-developed and well-nourished.  Eyes: No scleral injection.  HENT: No OP obstrucion.  Head: Normocephalic. EEG leads have been removed.  Respiratory: Effort normal, non-labored breathing.  Neurologic Examination   Mental Status: Patient rests with eyes closed but opens her eyes to voice. Responses are sometimes slow, and patient at times requires repeated stimulation to attend to exam Cranial Nerves: II: Left homonymous hemianopsia.   III,IV, VI: Extraocular movements intact VII: Smile symmetric VIII: Hearing intact to some questions and commands.  IX,X: Hypophonic speech XI: Symmetric Motor: Able to move all 4 extremities, antigravity strength in bilateral upper extremities Sensory: Appears intact to light touch Cerebellar: Finger-to-nose intact bilaterally Gait: Deferred  Medications  Current Facility-Administered Medications:    acetaminophen  (TYLENOL ) tablet 650 mg, 650 mg, Oral, Q4H PRN, 650 mg at 06/15/24 2037 **OR** acetaminophen  (TYLENOL ) 160 MG/5ML solution 650 mg, 650 mg, Per Tube, Q4H PRN **OR** acetaminophen  (TYLENOL ) suppository 650 mg, 650 mg, Rectal, Q4H PRN, Sundil, Subrina, MD   atorvastatin  (LIPITOR) tablet 40 mg, 40 mg, Oral,  Daily, Consuelo Denmark, MD, 40 mg at 06/16/24 1025   butalbital -acetaminophen -caffeine  (FIORICET ) 50-325-40 MG per tablet 1 tablet, 1 tablet, Oral, Q6H PRN, Sundil, Subrina, MD, 1 tablet at 06/13/24 0938   cefadroxil (DURICEF) capsule 500 mg, 500 mg, Oral, BID, Etter Hermann., MD, 500 mg at 06/16/24 2113   cyanocobalamin (VITAMIN B12) tablet 1,000 mcg, 1,000 mcg, Oral, Daily, Etter Hermann., MD, 1,000 mcg at 06/16/24 1026   diphenhydrAMINE  (BENADRYL ) capsule 50 mg, 50 mg, Oral, Q6H PRN, Pham, Minh Q, RPH-CPP   divalproex  (DEPAKOTE ) DR tablet 750 mg, 750 mg, Oral, Q12H, Shafer, Devon, NP, 750 mg at 06/16/24 2113   famotidine (PEPCID) tablet 40 mg, 40 mg, Oral, Daily, Sreeram, Narendranath, MD, 40 mg at 06/16/24 1025   levETIRAcetam  (KEPPRA ) undiluted injection 1,500 mg, 1,500 mg, Intravenous, Q12H, 1,500 mg at 06/14/24 0824 **OR** levETIRAcetam  (KEPPRA ) tablet 1,500 mg, 1,500 mg, Oral, Q12H, Shafer, Devon, NP, 1,500 mg at 06/16/24 2112   levothyroxine  (SYNTHROID ) tablet 50 mcg, 50 mcg, Oral, Daily, Etter Hermann., MD, 50 mcg at 06/17/24 4098   metoprolol  succinate (TOPROL -XL) 24 hr tablet 25 mg, 25 mg, Oral, Daily, Sreeram, Narendranath, MD, 25 mg at 06/16/24 1025   potassium chloride  SA (KLOR-CON  M) CR tablet 20 mEq, 20 mEq, Oral, Daily, Sreeram, Narendranath, MD, 20 mEq at 06/16/24 1033   senna-docusate (Senokot-S) tablet 1 tablet, 1 tablet, Oral, QHS PRN, Sundil, Subrina, MD, 1 tablet at 06/08/24 2202   sodium chloride  0.9 % bolus 500 mL, 500 mL, Intravenous, Once,  Aisha Hove, MD, Held at 06/07/24 1418   sodium chloride  flush (NS) 0.9 % injection 3-10 mL, 3-10 mL, Intravenous, Q12H, Sundil, Subrina, MD, 10 mL at 06/16/24 2113   sodium chloride  flush (NS) 0.9 % injection 3-10 mL, 3-10 mL, Intravenous, PRN, Sundil, Subrina, MD   trimethobenzamide  (TIGAN ) injection 200 mg, 200 mg, Intramuscular, Q6H PRN, Sundil, Subrina, MD  Labs and Diagnostic Imaging   CBC:  Recent  Labs  Lab 06/15/24 0438 06/16/24 0457 06/17/24 0442  WBC 7.9 8.1 9.9  NEUTROABS 5.8 5.7  --   HGB 13.7 14.4 13.9  HCT 41.1 43.5 41.7  MCV 91.5 92.6 91.2  PLT 195 170 178    Basic Metabolic Panel:  Lab Results  Component Value Date   NA 138 06/17/2024   K 4.1 06/17/2024   CO2 25 06/17/2024   GLUCOSE 128 (H) 06/17/2024   BUN 13 06/17/2024   CREATININE 1.00 06/17/2024   CALCIUM  9.2 06/17/2024   GFRNONAA 56 (L) 06/17/2024   GFRAA 71 06/21/2019   Lipid Panel:  Lab Results  Component Value Date   LDLCALC 132 (H) 06/07/2024   HgbA1c:  Lab Results  Component Value Date   HGBA1C 5.5 06/07/2024   Urine Drug Screen: No results found for: LABOPIA, COCAINSCRNUR, LABBENZ, AMPHETMU, THCU, LABBARB  Alcohol  Level     Component Value Date/Time   [facility] <15 06/06/2024 1458   INR  Lab Results  Component Value Date   INR 1.0 06/06/2024   APTT  Lab Results  Component Value Date   APTT 27 06/06/2024   Summary of imaging studies:    - MRI of the brain demonstrates multiple abnormalities including a right PCA stroke along with leptomeningeal/pial-arachnoid enhancement in the posterior left frontal lobe and high left parietal lobe corresponding to area of abnormality on CT. In addition, there is diffuse dural enhancement on the right.  The right occipital lobe infarction may be due to an infiltrative process as there is subtle sulcal enhancement at this location as well.  - MRI of the cervical, thoracic and lumbar spine demonstrates subtle leptomeningeal enhancement about the cervical cord and thoracic spinal cord most prominently at T11-T12 with leptomeningeal enhancement of the distal cord as well - CT Chest Abdomen and Pelvis shows no evidence for primary malignancy.   Summary of pertinent labs: - Quantiferon-TB gold Plus is negative - RPR negative   Autoimmune encephalopathy panel is pan-negative:    EKG: Atrial fibrillation with rapid ventricular response;    EEGs: - Spot EEG: This study showed 2 focal seizures without clinical signs arising from right occipital region at 0825 and 0833 lasting for about 1 minute 10 seconds each. Additionally there was evidence of cortical dysfunction in right occipital region likely secondary to underlying structural abnormality. - LTM read 6/10 demonstrated focal seizures in right oiccipital region. Depakote  500 mg BID was started at that time.  - LTM read 6/11 shows additional 3-4 seizures per hour. Increased Depakote  on 6/11 to 750 mg BID - LTM EEG report for 6/12: Focal seizure, right occipital region; background asymmetry, right<left; intermittent slow, right>left occipital region. This study showed focal seizures without clinical signs arising from the right occipital region. Seizures were noted on 06/10/2024 at 0127 and 0553, lasting for about 1 and half minute each. Additionally there was cortical dysfunction in right > left occipital region likely secondary to underlying structural abnormality.  - LTM EEG report on 6/13 demonstrated improvement: The study showed cortical dysfunction in right > left occipital  region, likely secondary to underlying structural abnormality, but with no definite seizures seen. - LTM EEG report on 6/14: Continuous slow,  generalized and maximal occipital region. This study showed cortical dysfunction in bilateral occipital region likely secondary to underlying structural abnormality. Additionally there was moderate diffuse encephalopathy. No definite seizures were noted  -LTM EEG report on 6/16 generalized periodic discharges with triphasic morphology, likely secondary to toxic metabolic causes, moderate diffuse encephalopathy and no seizures     Assessment  KENZLEIGH SEDAM is an 83 y.o. female with who presented with her typical migraine headache symptoms along with nausea, vertigo and vision changes. 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 She was loaded with Depakote  and Keppra  was later added. Titration of her AEDs resulted in lowering of the frequency of the electrographic seizures and then resolution.    - Exam today is somewhat improved from yesterday, but patient continues to be quite drowsy.  Would suspect that poor sleep and delirium play a part in this, would initiate delirium precautions and reinforce good day night cycles. - LTM EEG report for Saturday morning: Continuous slow,  generalized and maximal occipital region. This study showed cortical dysfunction in bilateral occipital region likely secondary to underlying structural abnormality. Additionally there was moderate diffuse encephalopathy. No definite seizures were noted -LTM EEG was discontinued but then reconnected Sunday due to GPD's with triphasic morphology.  No seizures are seen, and LTM read demonstrates GPD's with triphasic morphology and moderate diffuse encephalopathy but no seizures. 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 No associated hemorrhage or significant regional mass effect. No significant interval change in appearance of leptomeningeal enhancement within the high posterior left frontoparietal region. Again, this finding is nonspecific, with primary differential considerations including changes of meningitis, leptomeningeal carcinomatosis, sarcoid, or possibly lymphoma.  Subtly increased patchy FLAIR signal abnormality with a few foci of enhancement involving the left occipital lobe, new as compared to recent MRI from 06/06/2024. Findings are nonspecific, but could be related to recent seizure given provided history. Superimposed changes of PRES would be the primary differential consideration, although BP has been well-controlled.  - Recent Labs:  - VPA level on 6/13  therapeutic at 70 - Ammonia level this morning (Sunday) is now normalized.   - ANA Ab, IFA: negative - ACE level normal - TSH elevated - Folate normal - B12 low at 316 - CRP normal - Lyme disease PCR pending - Results of LP performed Saturday by Neurosurgery:  - Clear, colorless - Glucose 42 (blood glucose of 90 seven hours earlier suggests increased glucose consumption within the CSF compartment).  - WBC 1 in tube 4 - Protein normal at 44 - Meningitis/encephalitis PCR panel negative - TB NAA test: Pending - Culture no growth at 24 hours - Gram stain: No organisms seen - Acid fast culture and smear: Pending - CSF cryptococcal Ag: Negative - Impression: - Differential for her lesions on MRI, with associated new onset seizures, is broad and includes CNS lymphoma, vasculitis, leptomeningeal carcinomatosis secondary to metastatic solid tumor and neurosarcoidosis.  -Given drowsiness, suspect that poor sleep schedule and hospital induced delirium are playing a part in patient's current presentation. - Bacterial meningitis is essentially off the DDx given that she is not encephalopathic, has no fever has only a mildly elevated white count and has no neck  stiffness A fungal meningitis is also possible but unlikely given the lack of meningismus with intact mentation.  - DDx for her right occipital lobe stroke includes atrial fibrillation (see EKG result above) versus an infiltrative process resulting in a vasculopathy at this location (see MRI discussion above).  - We feel that she may benefit from a biopsy of her left parietofrontal enhancing lesion as seen on MRI, but will await results of CSF cytology before deciding whether to do this              - Paroxysmal atrial fibrillation, not on anticoagulation due to history of GI bleed   Recommendations  - Synthroid  dosing per primary team - B12 supplementation - Continue Depakote  at 750mg  BID.  - Continue scheduled Keppra  at 1500 mg BID   -  Not on anticoagulation for her atrial fibrillation due to GI bleed - BP management.   - Delirium precautions, encourage wakefulness during the day and rest at night   Patient seen and examined by NP/APP with MD. MD to update note as needed.   Imogene Mana, DNP, FNP-BC [facility] Pager: [phone]   NEUROHOSPITALIST ADDENDUM Performed a face to face diagnostic evaluation.   I have reviewed the contents of history and physical exam as documented by PA/ARNP/Resident and agree with above documentation.  I have discussed and formulated the above plan as documented. Edits to the note have been made as needed.  Impression/Key exam findings/Plan: lethargic/drowsy post op but moving all extremities. Able to follow simple commands but requires constant stimulation. Will get a routine EEG. Biopsy results are pending.  Arlyss Weathersby, MD [facility] [phone]   If 7pm to 7am, please call on call as listed on AMION.

## 2024-06-17 NOTE — Anesthesia Procedure Notes (Signed)
 Procedure Name: Intubation Date/Time: 06/17/2024 11:28 AM  Performed by: Alys Julian, CRNAPre-anesthesia Checklist: Patient identified, Emergency Drugs available, Suction available and Patient being monitored Patient Re-evaluated:Patient Re-evaluated prior to induction Oxygen Delivery Method: Circle system utilized Preoxygenation: Pre-oxygenation with 100% oxygen Induction Type: IV induction Ventilation: Mask ventilation without difficulty Laryngoscope Size: Mac and 3 Grade View: Grade I Tube type: Oral Tube size: 7.0 mm Number of attempts: 1 Airway Equipment and Method: Stylet and Bite block Placement Confirmation: ETT inserted through vocal cords under direct vision, positive ETCO2 and breath sounds checked- equal and bilateral Secured at: 21 cm Tube secured with: Tape Dental Injury: Teeth and Oropharynx as per pre-operative assessment and Injury to lip  Comments: Performed by [facility]

## 2024-06-17 NOTE — Op Note (Signed)
 Brief history: The patient is an 83 year old white female who presented with mental status changes and an abnormal brain and spine MRI.  She underwent a lumbar puncture which felt to make a diagnosis.  She requested to perform a brain biopsy.  I discussed this with the patient, her sister and daughter.  They have consented for the patient to proceed with this procedure.  Pre-Op diagnosis: Abnormal brain MRI, leptomeningeal enhancement  Postop diagnosis: The same  Procedure: Left bur hole for open brain biopsy using neuronavigation and microdissection.  Surgeon: Dr. Pleasant Brilliant  Assistant: Marlana Silvan, NP  Anesthesia: General Endotracheal  Estimated blood loss: Minimal  Specimens: Brain culture and brain biopsy  Drains: None  Complications: None  Description of procedure: The patient was brought to the operating room by the anesthesia team.  General endotracheal anesthesia was induced.  I applied the [place] 3 point headrest of the patient's calvarium.  She remained in the semisitting position.  Her left scalp was then shaved with a clippers.  The surface coordinates were entered into the Stealth neuronavigation computer.  The patient's left scalp was then prepared with Betadine scrub and Betadine solution.  Sterile drapes were applied.  I then injected the ectomy sites with Marcaine with epinephrine  solution.  I used a scalpel to make a linear incision in the left posterior frontal scalp.  I exposed the underlying calvarium with the periosteal elevators.  I used a wheat Hydrographic surveyor for exposure.  I then used the high-speed drill to create a left posterior frontal burr hole.  I exposed the underlying dura.  I coagulated the underlying dura and incised it with a 15 blade scalpel.  This exposed the arachnoid which was somewhat opaque.  I used the microscissors to cut the arachnoid and dissected down to the cortical surface.  I confirmed that I was in the right location with the  Stealth neuronavigational unit.  I then carefully dissected down into the sulcus.  The brain tissue seemed a bit abnormal, a bit more firm and light color than I expected.  I obtained several specimens with the micropituitary forceps.  We sent specimens for permanent pathology as well as culture.  I then obtained hemostasis using Floseal.  We irrigated the wound out with saline solution.  I then placed Floseal over the bur hole.  I removed the retractor.  I reapproximated the patient's galea with interrupted 2-0 Vicryl suture.  I reapproximated the patient's skin with stainless steel staples.  The wound was then coated with bacitracin  ointment.  A sterile dressing was applied.  The drapes were removed.  I then remove the [place] 3 point headrest from the patient's calvarium.  By report all sponge, instrument, and needle counts were correct at the end of this case.

## 2024-06-17 NOTE — Consult Note (Addendum)
 NAME:  Gabriela Moyer, MRN:  [id number], DOB:  [birth year], LOS: 11 ADMISSION DATE:  06/06/2024, CONSULTATION DATE:  06/17/24 REFERRING MD:  Larrie Po, CHIEF COMPLAINT:  Post-op Biopsy   History of Present Illness:  83 year old female presenting to the ED on 06/06/24 complaining of an ongoing right sided headache for four days, associated with vertigo and vision changes. EEG with focal seizures and MRI with leptomeningeal/pial-arachnoid enhancement in posterior L frontal lobe and high left parietal lobe additionally a region of infarct in the R occipital lobe with associated leptomeningeal/pial arachnoid enhancement.  Neurology has been following during her stay, LP complete with neurosurgery and now admitted to the ICU after open biopsy of lesion in L frontal lobe.   Pertinent  Medical History  Chronic migraine, HLD, HTN, PAD, renal artery stenosis with renal artery stenting, Afib not on AC, GI bleeds, hypothyroidism, DM2  Significant Hospital Events: Including procedures, antibiotic start and stop dates in addition to other pertinent events   6/8-6/18:  6/19: OR for brain biopsy, admitted to ICU  Interim History / Subjective:  Awaking from surgery, confused, on 3L oxygen  Objective    Blood pressure (!) 141/48, pulse 74, temperature 97.9 F (36.6 C), temperature source Oral, resp. rate 18, height 5' 2 (1.575 m), weight 70.3 kg, SpO2 95%.        Intake/Output Summary (Last 24 hours) at 06/17/2024 1159 Last data filed at 06/17/2024 1148 Gross per 24 hour  Intake 215 ml  Output 252 ml  Net -37 ml   Filed Weights   06/06/24 1431 06/17/24 0850  Weight: 74 kg 70.3 kg    Examination: General: lethargic, no obvious distress Neuro: awakes to voice, following commands, slow to respond but states her name. Strength L>R, PEERLA HEENT: normocephalic, biopsy site on left frontal lobe covered with dressing Resp: Clear breath sounds, no use of accessory muscles CV: murmur auscultated over aortic  and pulmonic regions, palpable pulses in all extremities, no edema noted GI: abdomen soft non distended, bowel sounds present Skin: warm and dry, biopsy site covered, no other visible wounds   Resolved problem list   Assessment and Plan  Leptomeinigeal/pial-arachnoid enhancement in the posterior L frontal lobe and high left parietal lobe S/P open biopsy on 6/19 and LP on 6/14 (unremarkable) Focal seizures; most recent EEG with no seizures Acute CVA; 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 Suspect her current presentation reflects residual anesthesia vs potential seizure   Plan  Continue Keppra  1500 mg BID Continue Depakote  750 mg  Avoid fever Pain management  PRN 650 tylenol   PRN Vicodin  Cont. Fioricet  PRN PRN phenergan and zofran  IV hydration with NS given lethargy Speech/PT/OT Post op site care Decadron taper per NSGY Neuro exams per protocol EEG  ?Qtc prolongation  Plan EKG to confirm Avoid QT prolonging medications  Paroxsymal Afib Plan Cont. Metoprolol  25 mg Daily Optimize electrolytes AM CMP  UTI Culture growing Klebsiella  plan Cont. Duricef (8 total doses) D/C foley   Hypothyroidism Plan Continue Synthroid  50 mcg daily  Recheck TSH 4 weeks post dosing adjustment (7/13)    Best Practice (right click and Reselect all SmartList Selections daily)   Diet/type: NPO DVT prophylaxis SCD Pressure ulcer(s): N/A GI prophylaxis: H2B Lines: Arterial Line Foley:  Foley Code Status:  limited   Labs   CBC: Recent Labs  Lab 06/13/24 1151 06/14/24  1610 06/15/24 0438 06/16/24 0457 06/17/24 0442  WBC 9.6 11.4* 7.9 8.1 9.9  NEUTROABS  --  9.2* 5.8 5.7  --   HGB 14.7 14.6 13.7 14.4 13.9  HCT 43.6 43.1 41.1 43.5 41.7  MCV 89.7 90.4 91.5 92.6 91.2  PLT 144* 235 195 170 178    Basic Metabolic Panel: Recent Labs  Lab 06/13/24 1151  06/14/24 0443 06/15/24 0438 06/16/24 0457 06/17/24 0442  NA 138 139 138 137 138  K 4.2 3.8 3.8 3.9 4.1  CL 100 101 105 105 106  CO2 25 25 25 23 25   GLUCOSE 98 79 90 89 128*  BUN 17 22 25* 16 13  CREATININE 0.98 1.14* 1.19* 1.09* 1.00  CALCIUM  9.6 9.2 9.0 8.9 9.2  MG  --  1.5* 2.3 2.1 1.9  PHOS  --  3.1 2.4* 1.9*  --    GFR: Estimated Creatinine Clearance: 38.5 mL/min (by C-G formula based on SCr of 1 mg/dL). Recent Labs  Lab 06/14/24 0443 06/15/24 0438 06/16/24 0457 06/17/24 0442  WBC 11.4* 7.9 8.1 9.9    Liver Function Tests: Recent Labs  Lab 06/14/24 0443 06/15/24 0438 06/16/24 0457 06/17/24 0442  AST 48* 49* 52* 61*  ALT 35 35 35 39  ALKPHOS 163* 146* 164* 184*  BILITOT 0.7 0.6 0.6 0.5  PROT 6.0* 5.6* 5.8* 5.6*  ALBUMIN 2.9* 2.7* 2.7* 2.6*   No results for input(s): LIPASE, AMYLASE in the last 168 hours. Recent Labs  Lab 06/11/24 1108 06/12/24 0556 06/13/24 1151  AMMONIA 71* 46* 32    ABG    Component Value Date/Time   HCO3 34.3 (H) 06/13/2024 1149   TCO2 28 06/06/2024 1503   O2SAT 76.1 06/13/2024 1149     Coagulation Profile: No results for input(s): INR, PROTIME in the last 168 hours.  Cardiac Enzymes: No results for input(s): CKTOTAL, CKMB, CKMBINDEX, TROPONINI in the last 168 hours.  HbA1C: Hgb A1c MFr Bld  Date/Time Value Ref Range Status  06/07/2024 03:47 AM 5.5 4.8 - 5.6 % Final    Comment:    (NOTE)         Prediabetes: 5.7 - 6.4         Diabetes: >6.4         Glycemic control for adults with diabetes: <7.0   12/19/2016 04:18 AM 5.3 4.8 - 5.6 % Final    Comment:    (NOTE)         Pre-diabetes: 5.7 - 6.4         Diabetes: >6.4         Glycemic control for adults with diabetes: <7.0     CBG: Recent Labs  Lab 06/13/24 0049 06/13/24 1004  GLUCAP 78 90    Review of Systems:   Not able  Past Medical History:  She,  has a past medical history of Carotid arterial disease (HCC), Diastolic dysfunction,  History of stress test, Hyperlipidemia, Hypertension, PAF (paroxysmal atrial fibrillation) (HCC), Renal artery stenosis (HCC), and Subclavian arterial stenosis (HCC).   Surgical History:   Past Surgical History:  Procedure Laterality Date   CARDIAC CATHETERIZATION  11/03/2007   atherosclerotic right renal artery (Dr. Jammie Mccune)   Carotid Doppler  12/2010   R & L ICA with small amount fibrous plaque; R distal subclav. 70-99% diameter reduction; L mid subclav 50-69% diameter reduction   Lower Extremity Arterial Doppler  03/2008   right ABI - mild arterial insuff at rest; R CIA with less than 50%  diameter reduction, R & L SFA with less than 50% diameter reduction   NM MYOCAR PERF WALL MOTION  09/2009   bruce myoview  - normal perfusion, EF 915, low risk scan   RENAL ANGIOPLASTY  11/07/2009   5x74mm Cordis Genesis Aviator stent to ostium of right renal artery (Dr. Aleda Ammon)   Renal Artery Doppler  04/2012   celiac artery with >50% diameter reduction; R renal artery stent w/mildly elevated velocities (1-59% diameter reduction)   TRANSTHORACIC ECHOCARDIOGRAM  08/28/2009   EF=>55%; trace TR; mild AV regurg, mild pulm valve regurg   Upper Extremity Arterial Doppler  04/2012   right brachial pressure , left ; R distal subclavian & axillary arteries 70-99% diameter reduction; L distal subclavian/axillary & brachial arteries (50-69% diameter reduction)     Social History:   reports that she has never smoked. She has been exposed to tobacco smoke. She has never used smokeless tobacco. She reports that she does not drink alcohol  and does not use drugs.   Family History:  Her family history includes Cancer in her brother and father; Heart Problems in her maternal grandfather and maternal grandmother; Heart disease in her mother; Stroke in her mother.   Allergies Allergies  Allergen Reactions   Ezetimibe      Other Reaction(s): muscle aches, arm cramps   Codeine Nausea And Vomiting     Abdominal pain   Contrast Media [Iodinated Contrast Media] Nausea And Vomiting    Abdominal pain Caused low blood pressure   Morphine Nausea And Vomiting    Abdominal pain     Home Medications  Prior to Admission medications   Medication Sig Start Date End Date Taking? Authorizing Provider  amLODipine  (NORVASC ) 5 MG tablet TAKE 1 TABLET BY MOUTH DAILY 02/12/24  Yes Millicent Ally, MD  clopidogrel  (PLAVIX ) 75 MG tablet TAKE 1 TABLET BY MOUTH DAILY 02/18/24  Yes Millicent Ally, MD  hydrochlorothiazide  (HYDRODIURIL ) 25 MG tablet TAKE 1 TABLET(25 MG) BY MOUTH DAILY 12/30/23  Yes Millicent Ally, MD  levothyroxine  (SYNTHROID , LEVOTHROID) 25 MCG tablet Take 1 tablet by mouth daily. 05/21/18  Yes [provider]  metoprolol  succinate (TOPROL -XL) 100 MG 24 hr tablet TAKE 1 TABLET(100 MG) BY MOUTH DAILY Patient taking differently: Take 50 mg by mouth in the morning and at bedtime. 06/25/23  Yes Avanell Leigh, MD  rosuvastatin (CRESTOR) 5 MG tablet Take 5 mg by mouth daily as needed.   Yes [provider]  ezetimibe  (ZETIA ) 10 MG tablet TAKE 1 TABLET(10 MG) BY MOUTH DAILY Patient not taking: Reported on 06/06/2024 12/16/22   Millicent Ally, MD  metoprolol  tartrate (LOPRESSOR ) 50 MG tablet Take 2 hours prior to procedure 05/26/19   Meng, Hao, PA  pravastatin  (PRAVACHOL ) 40 MG tablet Take 1 tablet (40 mg total) by mouth every evening. Patient not taking: Reported on 06/06/2024 02/08/21   Millicent Ally, MD     Critical care time:      Annita Bash S-NP

## 2024-06-17 NOTE — Transfer of Care (Signed)
 Immediate Anesthesia Transfer of Care Note  Patient: Gabriela Moyer  Procedure(s) Performed: FRAMELESS STEREOTACTIC BIOPSY COMPUTER-ASSISTED NAVIGATION, FOR CRANIAL PROCEDURE  Patient Location: PACU  Anesthesia Type:General  Level of Consciousness: awake, drowsy, patient cooperative, and responds to stimulation  Airway & Oxygen Therapy: Patient Spontanous Breathing and Patient connected to face mask oxygen  Post-op Assessment: Report given to RN, Post -op Vital signs reviewed and stable, Patient moving all extremities X 4, and Patient able to stick tongue midline  Post vital signs: Reviewed and stable  Last Vitals:  Vitals Value Taken Time  BP 137/78 06/17/24 13:32  Temp 98.2   Pulse 92 06/17/24 13:34  Resp 23 06/17/24 13:34  SpO2 100 % 06/17/24 13:34  Vitals shown include unfiled device data.  Last Pain:  Vitals:   06/17/24 0928  TempSrc:   PainSc: 0-No pain      Patients Stated Pain Goal: 0 (06/14/24 2039)  Complications: No notable events documented.

## 2024-06-17 NOTE — Progress Notes (Signed)
 SLP Cancellation Note  Patient Details Name: Gabriela Moyer MRN: [id number] DOB: [birth year]   Cancelled treatment:        Off floor in procedure. Plan is to follow up first next week   [name] 06/17/2024, 9:46 AM

## 2024-06-17 NOTE — Progress Notes (Signed)
 Subjective: The patient is somnolent but arousable.  Her daughter and sister are at bedside.  Objective: Vital signs in last 24 hours: Temp:  [97.2 F (36.2 C)-98.2 F (36.8 C)] 97.9 F (36.6 C) (06/19 0850) Pulse Rate:  [69-79] 74 (06/19 1038) Resp:  [14-24] 18 (06/19 1038) BP: (93-144)/(33-79) 141/48 (06/19 0850) SpO2:  [94 %-98 %] 95 % (06/19 1038) Weight:  [70.3 kg] 70.3 kg (06/19 0850) Estimated body mass index is 28.35 kg/m as calculated from the following:   Height as of this encounter: 5' 2 (1.575 m).   Weight as of this encounter: 70.3 kg.   Intake/Output from previous day: 06/18 0701 - 06/19 0700 In: 200 [P.O.:200] Out: 252 [Urine:250; Stool:2] Intake/Output this shift: No intake/output data recorded.  Physical exam the patient is somnolent but arousable.  She is moving all 4 extremities.  Lab Results: Recent Labs    06/16/24 0457 06/17/24 0442  WBC 8.1 9.9  HGB 14.4 13.9  HCT 43.5 41.7  PLT 170 178   BMET Recent Labs    06/16/24 0457 06/17/24 0442  NA 137 138  K 3.9 4.1  CL 105 106  CO2 23 25  GLUCOSE 89 128*  BUN 16 13  CREATININE 1.09* 1.00  CALCIUM  8.9 9.2    Studies/Results: No results found.  Assessment/Plan: Abnormal brain MRI: We spoke with Dr. Murvin Arthurs, neurology yesterday who confirmed that the biopsy would be helpful.  I have again discussed the situation with the patient, her daughter and sister.  We discussed the risk, benefits alternatives of surgery.  I have answered all her questions.  She wants to proceed with a left brain biopsy.  LOS: 11 days     Elder Greening 06/17/2024, 11:02 AM

## 2024-06-17 NOTE — Progress Notes (Signed)
EEG complete. Results pending.  ?

## 2024-06-17 NOTE — Progress Notes (Signed)
 eLink Physician-Brief Progress Note Patient Name: Gabriela Moyer DOB: April 25, 1941 MRN: [id number]   Date of Service  06/17/2024  HPI/Events of Note  83 year old female that initially presented with headaches found to have leptomeningeal enhancement and taken to the OR for bur hole and biopsy.  Since returning from the OR, she has been less responsive/more lethargic.  She has no focal deficits and is still able to follow commands with significant stimulation, but likely unsafe for p.o. intake.  eICU Interventions  Reviewed currently ordered meds Add IV alternatives to prophylactic cefadroxil and Depakote .  Hold other meds for the time being until mentation improves     Intervention Category Minor Interventions: Clinical assessment - ordering diagnostic tests  Gabriela Moyer 06/17/2024, 9:19 PM

## 2024-06-17 NOTE — Progress Notes (Addendum)
 Pt is stable hemodynamically. Neuro check q 4 hrs, the last assessment per document in flowsheet below. Pt has been NPO after midnight preparing for brain biopsy this am. We are waiting for signing an informed consent with a family member who will visit this 8.30 am. Pt has had two times watery stools tonight, no abdominal pain, no black tarry or bloody stools. We will monitor.    06/17/24 0433  Neurological  Neuro (WDL) X  Orientation Level Oriented to person;Oriented to place;Disoriented to time;Disoriented to situation  Cognition Poor attention/concentration;Follows commands;Memory impairment;Developmentally delayed  Speech Delayed responses;Slurred/Dysarthria  R Pupil Size (mm) 3  R Pupil Shape Round  R Pupil Reaction Brisk  L Pupil Size (mm) 3  L Pupil Shape Round  L Pupil Reaction Brisk  Motor Function/Sensation Assessment Grip;Motor strength;Sensation;Motor response;Dorsiflexion;Plantar flexion;Pronator drift;Arm ataxia;Leg ataxia  Facial Symmetry Symmetrical  R Hand Grip Moderate  L Hand Grip Moderate  R Elbow Extension (Push/Biceps) Moderate  L Elbow Extension (Push/Biceps) Moderate  R Elbow Flexion (Pull/Triceps) Moderate  L Elbow Flexion (Pull/Triceps) Moderate  Right Pronator Drift Present  Left Pronator Drift Present  R Foot Dorsiflexion Weak (pain)  L Foot Dorsiflexion Strong  R Foot Plantar Flexion Weak (pain)  L Foot Plantar Flexion Strong  R Finger to Nose (Point to [employer]) Smooth  L Finger to Nose (Point to [employer]) Smooth  R Heel to [employer] to [employer]) Ataxia (pain)  L Heel to [employer] (Point to [employer]) Smooth  RUE Motor Response Purposeful movement  RUE Sensation Full sensation  RUE Motor Strength 4  LUE Motor Response Purposeful movement  LUE Sensation Full sensation  LUE Motor Strength 4  RLE Motor Response Purposeful movement  RLE Sensation Pain;Full sensation  RLE Motor Strength 4  LLE Motor Response Purposeful movement  LLE Sensation Full sensation   LLE Motor Strength 5  Neuro Symptoms Forgetful;Fatigue  Neuro symptoms relieved by Rest;Relaxation techniques (Comment)  Glasgow Coma Scale  Eye Opening 4  Best Verbal Response (NON-intubated) 4  Best Motor Response 6  Glasgow Coma Scale Score 14  NIH Stroke Scale   Dizziness Present No  Headache Present No  Interval Other (Comment)  Level of Consciousness (1a.)    0  LOC Questions (1b. )    0  LOC Commands (1c. )    0  Best Gaze (2. )   1  Visual (3. )   2  Facial Palsy (4. )     0  Motor Arm, Left (5a. )    1  Motor Arm, Right (5b. )  1  Motor Leg, Left (6a. )   1  Motor Leg, Right (6b. )  1  Limb Ataxia (7. ) 1  Sensory (8. )   0  Best Language (9. )   1  Dysarthria (10. ) 1  Extinction/Inattention (11.)    1  Complete NIHSS TOTAL 11   Brain Cahill, RN

## 2024-06-17 NOTE — Anesthesia Preprocedure Evaluation (Addendum)
 Anesthesia Evaluation  Patient identified by MRN, date of birth, ID band Patient awake    Reviewed: Allergy & Precautions, NPO status , Patient's Chart, lab work & pertinent test results  Airway Mallampati: II  TM Distance: >3 FB Neck ROM: Full    Dental  (+) Dental Advisory Given   Pulmonary neg pulmonary ROS   breath sounds clear to auscultation       Cardiovascular hypertension, Pt. on medications and Pt. on home beta blockers + Peripheral Vascular Disease  + dysrhythmias Atrial Fibrillation  Rhythm:Regular Rate:Normal     Neuro/Psych  Headaches, Seizures -,  CVA    GI/Hepatic negative GI ROS, Neg liver ROS,,,  Endo/Other  Hypothyroidism    Renal/GU Renal InsufficiencyRenal disease     Musculoskeletal   Abdominal   Peds  Hematology negative hematology ROS (+)   Anesthesia Other Findings   Reproductive/Obstetrics                             Anesthesia Physical Anesthesia Plan  ASA: 3  Anesthesia Plan: General   Post-op Pain Management:    Induction: Intravenous  PONV Risk Score and Plan: 3 and Dexamethasone, Ondansetron  and Treatment may vary due to age or medical condition  Airway Management Planned: Oral ETT  Additional Equipment: Arterial line  Intra-op Plan:   Post-operative Plan: Extubation in OR and Possible Post-op intubation/ventilation  Informed Consent: I have reviewed the patients History and Physical, chart, labs and discussed the procedure including the risks, benefits and alternatives for the proposed anesthesia with the patient or authorized representative who has indicated his/her understanding and acceptance.   Patient has DNR.  Suspend DNR.   Dental advisory given  Plan Discussed with: CRNA  Anesthesia Plan Comments:        Anesthesia Quick Evaluation

## 2024-06-18 ENCOUNTER — Encounter (HOSPITAL_COMMUNITY): Payer: Self-pay | Admitting: Neurosurgery

## 2024-06-18 DIAGNOSIS — R569 Unspecified convulsions: Secondary | ICD-10-CM | POA: Diagnosis not present

## 2024-06-18 DIAGNOSIS — I6350 Cerebral infarction due to unspecified occlusion or stenosis of unspecified cerebral artery: Secondary | ICD-10-CM | POA: Diagnosis not present

## 2024-06-18 DIAGNOSIS — G939 Disorder of brain, unspecified: Secondary | ICD-10-CM | POA: Diagnosis not present

## 2024-06-18 DIAGNOSIS — G9341 Metabolic encephalopathy: Secondary | ICD-10-CM | POA: Diagnosis not present

## 2024-06-18 DIAGNOSIS — I480 Paroxysmal atrial fibrillation: Secondary | ICD-10-CM | POA: Diagnosis not present

## 2024-06-18 DIAGNOSIS — R410 Disorientation, unspecified: Secondary | ICD-10-CM | POA: Diagnosis not present

## 2024-06-18 LAB — CBC
HCT: 40.2 % (ref 36.0–46.0)
Hemoglobin: 13.2 g/dL (ref 12.0–15.0)
MCH: 30.8 pg (ref 26.0–34.0)
MCHC: 32.8 g/dL (ref 30.0–36.0)
MCV: 93.9 fL (ref 80.0–100.0)
Platelets: 158 10*3/uL (ref 150–400)
RBC: 4.28 MIL/uL (ref 3.87–5.11)
RDW: 12.3 % (ref 11.5–15.5)
WBC: 10.3 10*3/uL (ref 4.0–10.5)
nRBC: 0 % (ref 0.0–0.2)

## 2024-06-18 LAB — COMPREHENSIVE METABOLIC PANEL WITH GFR
ALT: 31 U/L (ref 0–44)
AST: 45 U/L — ABNORMAL HIGH (ref 15–41)
Albumin: 2.4 g/dL — ABNORMAL LOW (ref 3.5–5.0)
Alkaline Phosphatase: 149 U/L — ABNORMAL HIGH (ref 38–126)
Anion gap: 8 (ref 5–15)
BUN: 14 mg/dL (ref 8–23)
CO2: 26 mmol/L (ref 22–32)
Calcium: 9.3 mg/dL (ref 8.9–10.3)
Chloride: 105 mmol/L (ref 98–111)
Creatinine, Ser: 0.88 mg/dL (ref 0.44–1.00)
GFR, Estimated: >60 mL/min (ref >60)
Glucose, Bld: 143 mg/dL — ABNORMAL HIGH (ref 70–99)
Potassium: 4.3 mmol/L (ref 3.5–5.1)
Sodium: 139 mmol/L (ref 135–145)
Total Bilirubin: 0.2 mg/dL (ref 0.0–1.2)
Total Protein: 5.9 g/dL — ABNORMAL LOW (ref 6.5–8.1)

## 2024-06-18 LAB — METHYLMALONIC ACID, SERUM: Methylmalonic Acid, Quantitative: 257 nmol/L (ref 0–378)

## 2024-06-18 NOTE — Evaluation (Addendum)
 Clinical/Bedside Swallow Evaluation Patient Details  Name: Gabriela Moyer MRN: [id number] Date of Birth: 1941-07-29  Today's Date: 06/18/2024 Time: SLP Start Time (ACUTE ONLY): 1333 SLP Stop Time (ACUTE ONLY): 1347 SLP Time Calculation (min) (ACUTE ONLY): 14 min  Past Medical History:  Past Medical History:  Diagnosis Date   Carotid arterial disease (HCC)    a. 08/2016 Carotid U/S: 1-39% bilat ICA stenosis, >50 R ECA stenosis.   Diastolic dysfunction    a. 11/2016 Echo: EF 65-70%, Gr1 DD.   History of stress test    a. 12/2016 Lexiscan  MV: EF 73%, no ischemia/infarct.   Hyperlipidemia    Hypertension    PAF (paroxysmal atrial fibrillation) (HCC)    a. 11/2016 AF RVR in ED-->converted on IV dilt-->CHA2DS2VASc = 4-->Xarelto .   Renal artery stenosis (HCC)    a. 06/2009 s/p R Renal Artery PTA/stenting; b. 08/2016 Renal Duplex: nl LRA, >60% RRA stenosis- f/u 1 yr.   Subclavian arterial stenosis (HCC)    a. 08/2014 Upper Ext Duplex: R distal [place] & Ax - 70-99% diam reduction, L distal [place] & Ax - 50-69%--stable.   Past Surgical History:  Past Surgical History:  Procedure Laterality Date   APPLICATION OF CRANIAL NAVIGATION N/A 06/17/2024   Procedure: COMPUTER-ASSISTED NAVIGATION, FOR CRANIAL PROCEDURE;  Surgeon: Garry Kansas, MD;  Location: [facility] OR;  Service: Neurosurgery;  Laterality: N/A;   CARDIAC CATHETERIZATION  11/03/2007   atherosclerotic right renal artery (Dr. Jammie Mccune)   Carotid Doppler  12/2010   R & L ICA with small amount fibrous plaque; R distal subclav. 70-99% diameter reduction; L mid subclav 50-69% diameter reduction   Lower Extremity Arterial Doppler  03/2008   right ABI - mild arterial insuff at rest; R CIA with less than 50% diameter reduction, R & L SFA with less than 50% diameter reduction   NM MYOCAR PERF WALL MOTION  09/2009   bruce myoview  - normal perfusion, EF 915, low risk scan   PR DURAL GRAFT SPINAL N/A 06/17/2024   Procedure: FRAMELESS STEREOTACTIC BIOPSY;  Surgeon:  Garry Kansas, MD;  Location: [facility] OR;  Service: Neurosurgery;  Laterality: N/A;  OPEN BRAIN BX WITH STEALTH   RENAL ANGIOPLASTY  11/07/2009   5x79mm Cordis Genesis Aviator stent to ostium of right renal artery (Dr. Aleda Ammon)   Renal Artery Doppler  04/2012   celiac artery with >50% diameter reduction; R renal artery stent w/mildly elevated velocities (1-59% diameter reduction)   TRANSTHORACIC ECHOCARDIOGRAM  08/28/2009   EF=>55%; trace TR; mild AV regurg, mild pulm valve regurg   Upper Extremity Arterial Doppler  04/2012   right brachial pressure , left ; R distal subclavian & axillary arteries 70-99% diameter reduction; L distal subclavian/axillary & brachial arteries (50-69% diameter reduction)   HPI:  Gabriela Moyer is an 83 y.o. female with who presented to the hospital on 06/06/24 after four days of severe headache (has migraine headaches at baseline) as well as vertigo and vision changes. MRI brain was positive for Leptomeningeal/pial-arachnoid enhancement in the posterior left frontal lobe and high left parietal lobe with differential considerations including changes of meningitis, leptomeningeal carcinomatosis, sarcoid, or possibly lymphoma.. EEG showed status epileptius. 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 Gabriela Moyer underwent brain biopsy with burr hole (left frontal) on 6/19. BSE 6/15 oral dysphagia with cognitive component rec; NPO. Follow up next day continue NPO d/t decreased alertness/coughing. MD started Gabriela Moyer on Dys 3/thin. SLP followed up 6/18- no s/s aspiration, slow but  functional mastication. Gabriela Moyer's son wished to remain on Dys 3, thin. ST reconsulted following surgery 6/19. 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 stent 2010    Assessment / Plan / Recommendation  Clinical Impression  Gabriela Moyer familiar to ST services  from previous assessments where she was on Dys 3/thin liquids. Gabriela Moyer reassessed after brain biopsy/bur hole surgery and MD ordered Dys 3/thin liquids. Gabriela Moyer coughed with RN with water this morning. With SLP Gabriela Moyer is lethargic and needed frequent verbal and tactile stimulation to stay awake. Her dentition is intact, able to lateralize tongue but unable to assess labial movement due to lethargy. Gabriela Moyer affirmed odonophagia. She accepted bites applesauce with overall timely transit and swallows. Thin liquids resulted in decreased labial seal and mild left leakage. There was immediate throat clears with cup sips water. Subsequent trials Gabriela Moyer had immediate cough with water. Recommend NPO (RN discontinued diet order) except crushed pills in applesauce, ice chips only when adequately alert after oral care. ST will continue to see Gabriela Moyer working towards safe po recommendations. SLP Visit Diagnosis: Dysphagia, unspecified (R13.10)    Aspiration Risk  Moderate aspiration risk    Diet Recommendation Ice chips PRN after oral care;NPO except meds;Other (Comment) (meds crushed)    Medication Administration: Crushed with puree    Other  Recommendations Oral Care Recommendations: Oral care QID     Assistance Recommended at Discharge    Functional Status Assessment Patient has had a recent decline in their functional status and demonstrates the ability to make significant improvements in function in a reasonable and predictable amount of time.  Frequency and Duration min 2x/week  2 weeks       Prognosis Prognosis for improved oropharyngeal function: Good      Swallow Study   General Date of Onset: 06/17/24 (date of reconsult for swallow) HPI: Gabriela Moyer is an 83 y.o. female with who presented to the hospital on 06/06/24 after four days of severe headache (has migraine headaches at baseline) as well as vertigo and vision changes. MRI brain was positive for Leptomeningeal/pial-arachnoid enhancement in the posterior left frontal lobe and  high left parietal lobe with findings most concerning for meningitis and possible focus of encephalitis in right occipital lobe. EEG showed status epileptius. 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 Gabriela Moyer underwent brain biopsy with burr hole (left frontal) on 6/19. BSE 6/15 oral dysphagia with cognitive component rec; NPO. Follow up next day continue NPO d/t decreased alertness/coughing. MD started Gabriela Moyer on Dys 3/thin. SLP followed up 6/18- no s/s aspiration, slow but functional mastication. Gabriela Moyer's son wished to remain on Dys 3, thin. ST reconsulted following surgery 6/19. 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 stent 2010 Type of Study: Bedside Swallow Evaluation Previous Swallow Assessment:  (see HPI) Diet Prior to this Study: Dysphagia 3 (mechanical soft);Thin liquids (Level 0) (MD placed Gabriela Moyer on Dys 3/thin 6/20) Temperature Spikes Noted: No Respiratory Status: Room air History of Recent Intubation: Yes Total duration of intubation (days):  (intubated only for surgery then extubated) Date extubated: 06/17/24 Behavior/Cognition: Lethargic/Drowsy;Requires cueing Oral Cavity Assessment: Within Functional Limits Oral Care Completed by SLP: No Oral Cavity - Dentition: Adequate natural dentition Vision:  (possible history of visual disturbance) Self-Feeding Abilities: Needs assist Patient Positioning: Upright in bed Baseline Vocal Quality: Low vocal intensity Volitional  Cough: Weak    Oral/Motor/Sensory Function Overall Oral Motor/Sensory Function:  (lateralized tongue but difficulty maintaing alertness for labial movements)   Ice Chips Ice chips: Not tested   Thin Liquid Thin Liquid: Impaired Presentation: Cup;Straw Oral Phase Impairments: Reduced labial seal Oral Phase Functional Implications: Left anterior  spillage Pharyngeal  Phase Impairments: Throat Clearing - Immediate;Cough - Immediate    Nectar Thick Nectar Thick Liquid: Not tested   Honey Thick Honey Thick Liquid: Not tested   Puree Puree: Within functional limits Presentation: Spoon Pharyngeal Phase Impairments:  (none)   Solid     Solid: Not tested      [name] 06/18/2024,2:47 PM

## 2024-06-18 NOTE — Progress Notes (Signed)
   NAME:  Gabriela Moyer, MRN:  [id number], DOB:  Oct 29, 1941, LOS: 12 ADMISSION DATE:  06/06/2024, CONSULTATION DATE:  06/17/24 REFERRING MD:  Larrie Po, CHIEF COMPLAINT:  Post-op Biopsy   History of Present Illness:  83 year old female presenting to the ED on 06/06/24 complaining of an ongoing right sided headache for four days, associated with vertigo and vision changes. EEG with focal seizures and MRI with leptomeningeal/pial-arachnoid enhancement in posterior L frontal lobe and high left parietal lobe additionally a region of infarct in the R occipital lobe with associated leptomeningeal/pial arachnoid enhancement.  Neurology has been following during her stay, LP complete with neurosurgery and now admitted to the ICU after open biopsy of lesion in L frontal lobe.   Pertinent  Medical History  Chronic migraine, HLD, HTN, PAD, renal artery stenosis with renal artery stenting, Afib not on AC, GI bleeds, hypothyroidism, DM2  Significant Hospital Events: Including procedures, antibiotic start and stop dates in addition to other pertinent events   6/8-6/18:  6/19: OR for brain biopsy, admitted to ICU  Interim History / Subjective:  Awaking from surgery, confused, on 3L oxygen  Objective    Blood pressure (!) 115/55, pulse (!) 59, temperature (!) 96.5 F (35.8 C), temperature source Axillary, resp. rate 10, height 5' 2 (1.575 m), weight 70.3 kg, SpO2 96%.        Intake/Output Summary (Last 24 hours) at 06/18/2024 1152 Last data filed at 06/18/2024 1100 Gross per 24 hour  Intake 831.8 ml  Output 670 ml  Net 161.8 ml   Filed Weights   06/06/24 1431 06/17/24 0850  Weight: 74 kg 70.3 kg    Examination: General: lethargic, no obvious distress Neuro: awakes to voice, following commands, slow to respond but states her name. Strength L>R, PEERLA HEENT: normocephalic, biopsy site on left frontal lobe covered with dressing Resp: Clear breath sounds, no use of accessory muscles CV: murmur  auscultated over aortic and pulmonic regions, palpable pulses in all extremities, no edema noted GI: abdomen soft non distended, bowel sounds present Skin: warm and dry, biopsy site covered, no other visible wounds   Resolved problem list  Migraine  Assessment and Plan  Admitted w/ Focal seizures, complicated by acute metabolic encephalopathy ? Post ictal vs  Acute/subacute R PCA infarct involving the R occipital lobe. MRI showing. Leptomeinigeal enhancement in the posterior L frontal lobe and high left parietal lobe S/P open biopsy on 6/19 to r/o leptomeningeal carcinomatosis, sarcoid, or possibly lymphoma.  Plan  Continue Keppra  1500 mg BID Continue Depakote  750 mg  Avoid fever Pain management  PRN 650 tylenol   PRN phenergan and zofran  Dc IV infusions Cont steroids (taper per NSGY) Speech/PT/OT Post op site care Serial neuro checks   Qtc prolongation  Plan Avoid QT prolonging medications as able   Paroxsymal Afib Plan Cont. Metoprolol  25 mg Daily Optimize electrolytes Not candidate for [facility]  UTI Culture growing Klebsiella  plan Cont. Duricef (8 total doses) D/C foley   Hypothyroidism Plan Continue Synthroid  50 mcg daily  Recheck TSH 4 weeks post dosing adjustment (7/13)    Best Practice (right click and Reselect all SmartList Selections daily)   Diet/type: dysphagia diet (see orders) DVT prophylaxis SCD Pressure ulcer(s): N/A GI prophylaxis: H2B Lines: No longer needed.  Order written to d/c  Foley:  Foley Code Status:  limited   Critical care time: NA     To progressive back to [facility]

## 2024-06-18 NOTE — Progress Notes (Signed)
 Inpatient Rehab Admissions Coordinator:    CIR following. Pt. Underwent open brain biopsy yesterday. Will see how she does with therapies over the next few days and determine if she remains an appropriate candidate for CIR.  Wandalee Gust, MS, CCC-SLP Rehab Admissions Coordinator  [phone] (celll) [phone] (office)

## 2024-06-18 NOTE — Procedures (Signed)
 Patient Name: Gabriela Moyer  MRN: [id number]  Epilepsy Attending: Arleene Lack  Referring Physician/Provider: Khaliqdina, Salman, MD  Date: [id number] Duration: 25.61mins   Patient history: 83 y.o. female with visual disturbance. EEG to evaluate for seizure.    Level of alertness: comatose/ lethargic   AEDs during EEG study: LEV, VPA   Technical aspects: This EEG study was done with scalp electrodes positioned according to the 10-20 International system of electrode placement. Electrical activity was reviewed with band pass filter of 1-70Hz , sensitivity of 7 uV/mm, display speed of 40mm/sec with a 60Hz  notched filter applied as appropriate. EEG data were recorded continuously and digitally stored.  Video monitoring was available and reviewed as appropriate. C3 channel was off during recording du to technical issues    Description:  EEG showed continuous generalized 3 to 5 Hz theta-delta slowing admixed with intermittent 13-15hz  beta activity. Hyperventilation and photic stimulation were not performed.      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 No seizures or epileptiform discharges were noted.   Taylr Meuth O Cliford Sequeira

## 2024-06-18 NOTE — Progress Notes (Signed)
 NEUROLOGY CONSULT FOLLOW UP NOTE   Date of service: June 18, 2024 Patient Name: Gabriela Moyer MRN:  [id number] DOB:  [birth year]  Interval Hx/subjective  Brain biopsy done. Patient seen post op in ICU. She is drowsy but opens eyes to voice. Will answer questions with one word, following simple commands.   Vitals   Vitals:   06/18/24 1100 06/18/24 1200 06/18/24 1300 06/18/24 1400  BP: (!) 115/55 135/83 (!) 130/56   Pulse: (!) 59 (!) 57 (!) 58 60  Resp: 10 11 10 10   Temp:  (!) 96.9 F (36.1 C)    TempSrc:  Axillary    SpO2: 97% 96%    Weight:      Height:         Body mass index is 28.35 kg/m.  Physical Exam   Constitutional: Appears well-developed and well-nourished.  Eyes: No scleral injection.  HENT: No OP obstrucion.  Head: Normocephalic. EEG leads have been removed.  Respiratory: Effort normal, non-labored breathing.  Neurologic Examination   Mental Status: Patient rests with eyes closed but opens her eyes to voice. Responses are sometimes slow, oriented to self, place, month. Cranial Nerves: II: Left homonymous hemianopsia.   III,IV, VI: Extraocular movements intact VII: Smile symmetric VIII: Hearing intact to some questions and commands.  IX,X: Hypophonic speech XI: Symmetric Motor: Able to move all 4 extremities, antigravity strength in bilateral upper and lower extremities Sensory: Appears intact to light touch Cerebellar: Finger-to-nose intact bilaterally Gait: Deferred  Medications  Current Facility-Administered Medications:    acetaminophen  (TYLENOL ) tablet 650 mg, 650 mg, Oral, Q4H PRN, 650 mg at 06/15/24 2037 **OR** acetaminophen  (TYLENOL ) 160 MG/5ML solution 650 mg, 650 mg, Per Tube, Q4H PRN **OR** acetaminophen  (TYLENOL ) suppository 650 mg, 650 mg, Rectal, Q4H PRN, Garry Kansas, MD   atorvastatin  (LIPITOR) tablet 40 mg, 40 mg, Oral, Daily, Garry Kansas, MD, 40 mg at 06/16/24 1025   cefadroxil (DURICEF) capsule 500 mg, 500 mg, Oral, Q12H,  500 mg at 06/18/24 1005 **OR** ceFAZolin (ANCEF) IVPB 1 g/50 mL premix, 1 g, Intravenous, Q12H, Paliwal, Aditya, MD, Stopped at 06/17/24 2253   Chlorhexidine Gluconate Cloth 2 % PADS 6 each, 6 each, Topical, Daily, Garry Kansas, MD, 6 each at 06/18/24 0915   cyanocobalamin (VITAMIN B12) tablet 1,000 mcg, 1,000 mcg, Oral, Daily, Garry Kansas, MD, 1,000 mcg at 06/16/24 1026   [COMPLETED] dexamethasone (DECADRON) injection 6 mg, 6 mg, Intravenous, Q6H, 6 mg at 06/18/24 1259 **FOLLOWED BY** dexamethasone (DECADRON) injection 4 mg, 4 mg, Intravenous, Q6H **FOLLOWED BY** [START ON 06/19/2024] dexamethasone (DECADRON) injection 4 mg, 4 mg, Intravenous, Q8H, Garry Kansas, MD   diphenhydrAMINE  (BENADRYL ) capsule 50 mg, 50 mg, Oral, Q6H PRN, Garry Kansas, MD   divalproex  (DEPAKOTE ) DR tablet 750 mg, 750 mg, Oral, Q12H **OR** valproate (DEPACON ) 750 mg in dextrose  5 % 50 mL IVPB, 750 mg, Intravenous, Q12H, Paliwal, Aditya, MD, Stopped at 06/18/24 1011   famotidine (PEPCID) tablet 40 mg, 40 mg, Oral, Daily, Garry Kansas, MD, 40 mg at 06/16/24 1025   HYDROcodone-acetaminophen  (NORCO/VICODIN) 5-325 MG per tablet 1 tablet, 1 tablet, Oral, Q4H PRN, Garry Kansas, MD, 1 tablet at 06/18/24 0258   labetalol (NORMODYNE) injection 10-40 mg, 10-40 mg, Intravenous, Q10 min PRN, Garry Kansas, MD, 10 mg at 06/17/24 1734   levETIRAcetam  (KEPPRA ) undiluted injection 1,500 mg, 1,500 mg, Intravenous, Q12H, 1,500 mg at 06/18/24 0909 **OR** levETIRAcetam  (KEPPRA ) tablet 1,500 mg, 1,500 mg, Oral, Q12H, Garry Kansas, MD, 1,500 mg at 06/16/24 2112   levothyroxine  (  SYNTHROID ) tablet 50 mcg, 50 mcg, Oral, Daily, Garry Kansas, MD, 50 mcg at 06/17/24 5621   metoprolol  succinate (TOPROL -XL) 24 hr tablet 25 mg, 25 mg, Oral, Daily, Garry Kansas, MD, 25 mg at 06/16/24 1025   ondansetron  (ZOFRAN ) tablet 4 mg, 4 mg, Oral, Q4H PRN **OR** ondansetron  (ZOFRAN ) injection 4 mg, 4 mg, Intravenous, Q4H PRN, Garry Kansas, MD   potassium chloride  SA (KLOR-CON  M) CR tablet 20 mEq, 20 mEq, Oral, Daily, Garry Kansas, MD, 20 mEq at 06/16/24 1033   promethazine (PHENERGAN) tablet 12.5-25 mg, 12.5-25 mg, Oral, Q4H PRN, Garry Kansas, MD   senna-docusate (Senokot-S) tablet 1 tablet, 1 tablet, Oral, QHS PRN, Garry Kansas, MD, 1 tablet at 06/08/24 2202   sodium chloride  0.9 % bolus 500 mL, 500 mL, Intravenous, Once, Garry Kansas, MD, Held at 06/07/24 1418  Labs and Diagnostic Imaging   CBC:  Recent Labs  Lab 06/15/24 0438 06/16/24 0457 06/17/24 0442 06/18/24 0505  WBC 7.9 8.1 9.9 10.3  NEUTROABS 5.8 5.7  --   --   HGB 13.7 14.4 13.9 13.2  HCT 41.1 43.5 41.7 40.2  MCV 91.5 92.6 91.2 93.9  PLT 195 170 178 158    Basic Metabolic Panel:  Lab Results  Component Value Date   NA 139 06/18/2024   K 4.3 06/18/2024   CO2 26 06/18/2024   GLUCOSE 143 (H) 06/18/2024   BUN 14 06/18/2024   CREATININE 0.88 06/18/2024   CALCIUM  9.3 06/18/2024   GFRNONAA >60 06/18/2024   GFRAA 71 06/21/2019   Lipid Panel:  Lab Results  Component Value Date   LDLCALC 132 (H) 06/07/2024   HgbA1c:  Lab Results  Component Value Date   HGBA1C 5.5 06/07/2024   Urine Drug Screen: No results found for: LABOPIA, COCAINSCRNUR, LABBENZ, AMPHETMU, THCU, LABBARB  Alcohol  Level     Component Value Date/Time   [facility] <15 06/06/2024 1458   INR  Lab Results  Component Value Date   INR 1.0 06/06/2024   APTT  Lab Results  Component Value Date   APTT 27 06/06/2024   Summary of imaging studies:    - MRI of the brain demonstrates multiple abnormalities including a right PCA stroke along with leptomeningeal/pial-arachnoid enhancement in the posterior left frontal lobe and high left parietal lobe corresponding to area of abnormality on CT. In addition, there is diffuse dural enhancement on the right.  The right occipital lobe infarction may be due to an infiltrative process as there is subtle sulcal  enhancement at this location as well.  - MRI of the cervical, thoracic and lumbar spine demonstrates subtle leptomeningeal enhancement about the cervical cord and thoracic spinal cord most prominently at T11-T12 with leptomeningeal enhancement of the distal cord as well - CT Chest Abdomen and Pelvis shows no evidence for primary malignancy.   Summary of pertinent labs: - Quantiferon-TB gold Plus is negative - RPR negative   Autoimmune encephalopathy panel is pan-negative:    EKG: Atrial fibrillation with rapid ventricular response;   EEGs: - Spot EEG: This study showed 2 focal seizures without clinical signs arising from right occipital region at 0825 and 0833 lasting for about 1 minute 10 seconds each. Additionally there was evidence of cortical dysfunction in right occipital region likely secondary to underlying structural abnormality. - LTM read 6/10 demonstrated focal seizures in right oiccipital region. Depakote  500 mg BID was started at that time.  - LTM read 6/11 shows additional 3-4 seizures per hour. Increased Depakote  on 6/11 to 750  mg BID - LTM EEG report for 6/12: Focal seizure, right occipital region; background asymmetry, right<left; intermittent slow, right>left occipital region. This study showed focal seizures without clinical signs arising from the right occipital region. Seizures were noted on 06/10/2024 at 0127 and 0553, lasting for about 1 and half minute each. Additionally there was cortical dysfunction in right > left occipital region likely secondary to underlying structural abnormality.  - LTM EEG report on 6/13 demonstrated improvement: The study showed cortical dysfunction in right > left occipital region, likely secondary to underlying structural abnormality, but with no definite seizures seen. - LTM EEG report on 6/14: Continuous slow,  generalized and maximal occipital region. This study showed cortical dysfunction in bilateral occipital region likely secondary to  underlying structural abnormality. Additionally there was moderate diffuse encephalopathy. No definite seizures were noted  -LTM EEG report on 6/16 generalized periodic discharges with triphasic morphology, likely secondary to toxic metabolic causes, moderate diffuse encephalopathy and no seizures     Assessment  Gabriela Moyer is an 83 y.o. female with who presented with her typical migraine headache symptoms along with nausea, vertigo and L hemianopsia. MRI Brain with a R PCA stroke and leptomeningeal enhancement within the high posterior left frontoparietal region. 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 She was loaded with Depakote  and Keppra  was later added. Titration of her AEDs resulted in lowering of the frequency of the electrographic seizures and then resolution. Csf studies with benign csf profile. She was noted to be delirious in the hospital with waxing and waning mentation. She underwent brain biopsy and results are pending.  - Impression: - Differential for her lesions on MRI, with associated new onset seizures, is broad and includes CNS lymphoma, vasculitis, leptomeningeal carcinomatosis secondary to metastatic solid tumor and neurosarcoidosis.  -Given drowsiness, suspect that poor sleep schedule and hospital induced delirium are playing a part in patient's current presentation. - Bacterial meningitis is essentially off the DDx given that she is not encephalopathic, has no fever has only a mildly elevated white count and has no neck stiffness A fungal meningitis is also possible but unlikely given the lack of meningismus with intact mentation. Csf profile also not consistent with meningitis. - DDx for her right occipital lobe stroke includes atrial fibrillation (see EKG result above) versus an infiltrative process resulting in a vasculopathy at this location (see MRI discussion above).   - CSF profile including cytology was non revealing. She underwent brain biopsy and results are pending.              - Paroxysmal atrial fibrillation, not on anticoagulation due to history of GI bleed   Recommendations  - Synthroid  dosing per primary team - B12 supplementation - Continue Depakote  at 750mg  BID.  - Continue scheduled Keppra  at 1500 mg BID   - Not on anticoagulation for her atrial fibrillation due to GI bleed - BP management.   - Delirium precautions, encourage wakefulness during the day and rest at night - brain biopsy done, results pending.  Minna Dumire, MD [facility] [phone]   If 7pm to 7am, please call on call as listed on AMION.

## 2024-06-18 NOTE — Progress Notes (Signed)
   Providing Compassionate, Quality Care - Together   Subjective: Patient is lethargic, but will open her eyes and interact with repeated stimulation.  Objective: Vital signs in last 24 hours: Temp:  [96.5 F (35.8 C)-98.5 F (36.9 C)] 96.9 F (36.1 C) (06/20 1200) Pulse Rate:  [57-96] 57 (06/20 1200) Resp:  [8-20] 11 (06/20 1200) BP: (99-151)/(43-83) 135/83 (06/20 1200) SpO2:  [94 %-100 %] 96 % (06/20 1200) Arterial Line BP: (96-158)/(43-74) 117/48 (06/20 0600)  Intake/Output from previous day: 06/19 0701 - 06/20 0700 In: 726.4 [I.V.:438.8; IV Piggyback:287.5] Out: 670 [Urine:665; Blood:5] Intake/Output this shift: Total I/O In: 260.5 [I.V.:198; IV Piggyback:62.5] Out: -   Lethargic Speech slurred PERRLA Opens eyes to voice. Able to tell me her name. Dozes off before I can ask further questions. MAE, decreased strength on the right Incision is clean, dry, and intact  Lab Results: Recent Labs    06/17/24 0442 06/18/24 0505  WBC 9.9 10.3  HGB 13.9 13.2  HCT 41.7 40.2  PLT 178 158   BMET Recent Labs    06/17/24 0442 06/18/24 0505  NA 138 139  K 4.1 4.3  CL 106 105  CO2 25 26  GLUCOSE 128* 143*  BUN 13 14  CREATININE 1.00 0.88  CALCIUM  9.2 9.3    Studies/Results: EEG adult Result Date: 06/18/2024 Arleene Lack, MD     06/18/2024  9:22 AM Patient Name: Gabriela Moyer MRN: [id number] Epilepsy Attending: Arleene Lack Referring Physician/Provider: Khaliqdina, Salman, MD Date: [id number] Duration: 25.101mins  Patient history: 83 y.o. female with visual disturbance. EEG to evaluate for seizure.  Level of alertness: comatose/ lethargic  AEDs during EEG study: LEV, VPA  Technical aspects: This EEG study was done with scalp electrodes positioned according to the 10-20 International system of electrode placement. Electrical activity was reviewed with band pass filter of 1-70Hz , sensitivity of 7 uV/mm, display speed of 30mm/sec with a 60Hz  notched filter applied as  appropriate. EEG data were recorded continuously and digitally stored.  Video monitoring was available and reviewed as appropriate. C3 channel was off during recording du to technical issues  Description:  EEG showed continuous generalized 3 to 5 Hz theta-delta slowing admixed with intermittent 13-15hz  beta activity. Hyperventilation and photic stimulation were not performed.    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 No seizures or epileptiform discharges were noted.  Arleene Lack    Assessment/Plan: Patient underwent left bur hole for open brain biopsy by Dr. Larrie Po on 06/17/2024. Pathology is still pending.   LOS: 12 days   -Continue supportive efforts  I am in communication with my attending and they agree with the plan for this patient.   Henreitta Locus, DNP, AGNP-C Nurse Practitioner  [facility] [address], [address], [place], [place] [address] P: [phone]    F: [phone]  06/18/2024, 12:56 PM

## 2024-06-18 NOTE — Progress Notes (Signed)
 Physical Therapy Treatment Patient Details Name: Gabriela Moyer MRN: [id number] DOB: April 11, 1941 Today's Date: 06/18/2024   History of Present Illness Pt is an 83 y/o F admitted on 06/06/24 after presenting with c/o HA x 4 days, as well as vertigo & vision changes. MRI brain w + w /o C which demonstrated leptomeningeal/pial-arachnoid enhancement in the posterior left frontal lobe and high left parietal lobe corresponding to area of abnormality on CT. 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 6/14 neuro status change & LP negative for acute findings. 6/19 bur hole for brain biopsy, results pending. PMH: chronic migraine, HTN, HLD, extensive PAD of subclavian, axillary, brachial & renal artery s/p R renal artery stent 2010, hypothyroidism, paroxysmal a-fib.    PT Comments  Pt lethargic, requires repeated stimulation to engage and speaks mostly monosyllabically and quietly. Pt requiring mod +2 assist for bed mobility and transition into standing, pt requiring repeated cues to complete tasks asked. Pt with eye closing frequently during session. PT to continue to follow.      If plan is discharge home, recommend the following: Assistance with cooking/housework;Assist for transportation;Help with stairs or ramp for entrance;A lot of help with walking and/or transfers;A lot of help with bathing/dressing/bathroom   Can travel by private vehicle        Equipment Recommendations  Rolling walker (2 wheels);BSC/3in1;Wheelchair (measurements PT);Wheelchair cushion (measurements PT)    Recommendations for Other Services       Precautions / Restrictions Precautions Precautions: Fall Recall of Precautions/Restrictions: Impaired Precaution/Restrictions Comments: left visual field cut, orthostatic Restrictions Weight Bearing Restrictions Per Provider Order: No     Mobility  Bed Mobility Overal bed mobility: Needs Assistance Bed Mobility: Rolling, Sidelying to Sit, Sit to  Sidelying Rolling: Min assist Sidelying to sit: Mod assist     Sit to sidelying: Mod assist, +2 for physical assistance, +2 for safety/equipment General bed mobility comments: pt initiated bed mobility well when given cues    Transfers Overall transfer level: Needs assistance Equipment used: 2 person hand held assist Transfers: Sit to/from Stand Sit to Stand: Mod assist, +2 physical assistance, +2 safety/equipment           General transfer comment: x2 attempts, pt unable to progress to lateral stepping    Ambulation/Gait                   Stairs             Wheelchair Mobility     Tilt Bed    Modified Rankin (Stroke Patients Only) Modified Rankin (Stroke Patients Only) Pre-Morbid Rankin Score: No symptoms Modified Rankin: Severe disability     Balance Overall balance assessment: Needs assistance Sitting-balance support: Feet supported Sitting balance-Leahy Scale: Fair     Standing balance support: Bilateral upper extremity supported, During functional activity Standing balance-Leahy Scale: Poor                              Communication Communication Communication: Impaired Factors Affecting Communication: Difficulty expressing self  Cognition Arousal: Lethargic Behavior During Therapy: Flat affect   PT - Cognitive impairments: Orientation, Awareness, Memory, Attention, Initiation, Sequencing, Problem solving, Safety/Judgement                       PT - Cognition Comments: very limited verbalizations, eye closing off and on throughout session. initiates some mobility today but limited Following commands: Impaired Following commands impaired:  Follows one step commands inconsistently, Follows one step commands with increased time    Cueing Cueing Techniques: Verbal cues, Gestural cues, Tactile cues  Exercises      General Comments General comments (skin integrity, edema, etc.): VSS      Pertinent Vitals/Pain  Pain Assessment Pain Assessment: Faces Faces Pain Scale: No hurt Pain Intervention(s): Monitored during session    Home Living                          Prior Function            PT Goals (current goals can now be found in the care plan section) Acute Rehab PT Goals PT Goal Formulation: Patient unable to participate in goal setting Time For Goal Achievement: 06/21/24 Potential to Achieve Goals: Fair Progress towards PT goals: Progressing toward goals    Frequency    Min 3X/week      PT Plan      Co-evaluation PT/OT/SLP Co-Evaluation/Treatment: Yes Reason for Co-Treatment: Complexity of the patient's impairments (multi-system involvement);For patient/therapist safety;To address functional/ADL transfers PT goals addressed during session: Mobility/safety with mobility;Balance OT goals addressed during session: ADL's and self-care      AM-PAC PT 6 Clicks Mobility   Outcome Measure  Help needed turning from your back to your side while in a flat bed without using bedrails?: A Little Help needed moving from lying on your back to sitting on the side of a flat bed without using bedrails?: A Lot Help needed moving to and from a bed to a chair (including a wheelchair)?: Total Help needed standing up from a chair using your arms (e.g., wheelchair or bedside chair)?: Total Help needed to walk in hospital room?: Total Help needed climbing 3-5 steps with a railing? : Total 6 Click Score: 9    End of Session Equipment Utilized During Treatment: Gait belt Activity Tolerance: Patient limited by lethargy Patient left: in bed;with call bell/phone within reach;with bed alarm set Nurse Communication: Mobility status PT Visit Diagnosis: Unsteadiness on feet (R26.81);Difficulty in walking, not elsewhere classified (R26.2)     Time: 4098-1191 PT Time Calculation (min) (ACUTE ONLY): 25 min  Charges:    $Therapeutic Activity: 8-22 mins PT General Charges $$ ACUTE PT  VISIT: 1 Visit                     Shirlene Doughty, PT DPT Acute Rehabilitation Services Secure Chat Preferred  Office [phone]    Brion Hedges Cydney Draft 06/18/2024, 2:41 PM

## 2024-06-18 NOTE — Anesthesia Postprocedure Evaluation (Signed)
 Anesthesia Post Note  Patient: Gabriela Moyer  Procedure(s) Performed: FRAMELESS STEREOTACTIC BIOPSY COMPUTER-ASSISTED NAVIGATION, FOR CRANIAL PROCEDURE     Patient location during evaluation: PACU Anesthesia Type: General Level of consciousness: awake and alert Pain management: pain level controlled Vital Signs Assessment: post-procedure vital signs reviewed and stable Respiratory status: spontaneous breathing, nonlabored ventilation, respiratory function stable and patient connected to nasal cannula oxygen Cardiovascular status: blood pressure returned to baseline and stable Postop Assessment: no apparent nausea or vomiting Anesthetic complications: no   No notable events documented.  Last Vitals:  Vitals:   06/18/24 1937 06/18/24 2000  BP:    Pulse: 60   Resp: 11   Temp:  36.5 C  SpO2: 98%     Last Pain:  Vitals:   06/18/24 2000  TempSrc: Axillary  PainSc:                  [name]

## 2024-06-18 NOTE — Progress Notes (Signed)
 Pt transferred to 3W15 .

## 2024-06-18 NOTE — Progress Notes (Signed)
 Occupational Therapy Treatment Patient Details Name: Gabriela Moyer MRN: [id number] DOB: 01-11-1941 Today's Date: 06/18/2024   History of present illness Pt is an 83 y/o F admitted on 06/06/24 after presenting with c/o HA x 4 days, as well as vertigo & vision changes. MRI brain w + w /o C which demonstrated leptomeningeal/pial-arachnoid enhancement in the posterior left frontal lobe and high left parietal lobe corresponding to area of abnormality on CT. 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 6/14 neuro status change & LP negative for acute findings. 6/19 bur hole for brain biopsy, results pending. PMH: chronic migraine, HTN, HLD, extensive PAD of subclavian, axillary, brachial & renal artery s/p R renal artery stent 2010, hypothyroidism, paroxysmal a-fib.   OT comments  Pt is making limited progress towards their acute OT goals. Session completed with PT to safely address functional transfers. Overall pt was slightly more alert, talkative and interactive this date. She followed some simple one step commands for automatic tasks like rolling and standing. She rolled with min A, came to EOB with mod A and stood with mod A +2, pt was not able to progress to OOB transfers or lateral stepping this date. She continues to have a L field cut and therefore inattention to the L environment. OT to continue to follow acutely to facilitate progress towards established goals. Pt will continue to benefit from intensive inpatient follow up therapy, >3 hours/day after discharge.       If plan is discharge home, recommend the following:  A little help with walking and/or transfers;A little help with bathing/dressing/bathroom;Assist for transportation;Help with stairs or ramp for entrance   Equipment Recommendations  None recommended by OT    Recommendations for Other Services Rehab consult    Precautions / Restrictions Precautions Precautions: Fall Recall of Precautions/Restrictions:  Impaired Precaution/Restrictions Comments: left visual field cut, orthostatic Restrictions Weight Bearing Restrictions Per Provider Order: No       Mobility Bed Mobility Overal bed mobility: Needs Assistance Bed Mobility: Rolling, Sidelying to Sit, Sit to Sidelying Rolling: Min assist Sidelying to sit: Mod assist     Sit to sidelying: Mod assist, +2 for physical assistance, +2 for safety/equipment General bed mobility comments: pt initiated bed mobility well when given cues    Transfers Overall transfer level: Needs assistance Equipment used: 2 person hand held assist Transfers: Sit to/from Stand Sit to Stand: Mod assist, +2 physical assistance, +2 safety/equipment           General transfer comment: x2 attempts, pt unable to progress to lateral stepping     Balance Overall balance assessment: Needs assistance Sitting-balance support: Feet supported Sitting balance-Leahy Scale: Poor     Standing balance support: Bilateral upper extremity supported, During functional activity Standing balance-Leahy Scale: Poor           ADL either performed or assessed with clinical judgement   ADL Overall ADL's : Needs assistance/impaired           Toilet Transfer: Total assistance;+2 for physical assistance;+2 for safety/equipment Toilet Transfer Details (indicate cue type and reason): STS at EOB with mod A +2, unable to move feet therefor pt would be total A for SP transfer         Functional mobility during ADLs: Moderate assistance;+2 for physical assistance;+2 for safety/equipment General ADL Comments: pt continues to present with low LOA, L visual field cut, impiared cognition, limited activity tolerance    Extremity/Trunk Assessment Upper Extremity Assessment Upper Extremity Assessment: RUE deficits/detail;LUE  deficits/detail RUE Deficits / Details: reached across body to initiate rolling, attempted to use RUE for transfer. Otherwise difficult to assess due to  LOA RUE Coordination: decreased fine motor;decreased gross motor LUE Deficits / Details: less active movement of L noted, difficult to assess due to low LOA   Lower Extremity Assessment Lower Extremity Assessment: Defer to PT evaluation        Vision   Vision Assessment?: Yes Eye Alignment: Within Functional Limits Ocular Range of Motion: Within Functional Limits Alignment/Gaze Preference: Within Defined Limits Tracking/Visual Pursuits: Other (comment) Saccades: Additional head turns occurred during testing Convergence: Within functional limits Visual Fields: Left visual field deficit Additional Comments: continue to have L field cut at midline, pt is able to locate stim in the L environment with significant cues   Perception Perception Perception: Impaired Preception Impairment Details: Inattention/Neglect   Praxis Praxis Praxis: Impaired   Communication Communication Communication: Impaired Factors Affecting Communication: Difficulty expressing self   Cognition Arousal: Lethargic Behavior During Therapy: Flat affect Cognition: Difficult to assess             OT - Cognition Comments: lethargic with some improvement once sitting upright. Pt able to hold some conversation and follow simple 1 step commands with increased time for processing                 Following commands: Impaired Following commands impaired: Follows one step commands inconsistently, Follows one step commands with increased time      Cueing   Cueing Techniques: Verbal cues, Gestural cues, Tactile cues        General Comments VSS, BP improved with movement    Pertinent Vitals/ Pain       Pain Assessment Pain Assessment: Faces Faces Pain Scale: No hurt Pain Location: pt denied pain or HA Pain Intervention(s): Monitored during session   Frequency  Min 2X/week        Progress Toward Goals  OT Goals(current goals can now be found in the care plan section)  Progress towards OT  goals: Progressing toward goals  Acute Rehab OT Goals OT Goal Formulation: With patient Potential to Achieve Goals: Good ADL Goals Pt Will Perform Grooming: with supervision;standing Pt Will Perform Lower Body Dressing: with supervision;sit to/from stand Pt Will Transfer to Toilet: with supervision;ambulating;regular height toilet Pt Will Perform Toileting - Clothing Manipulation and hygiene: with supervision;sit to/from stand Pt Will Perform Tub/Shower Transfer: with supervision;Tub transfer;shower seat;ambulating;rolling walker Additional ADL Goal #1: Pt will complete ADLs with dizziness reported at less than or equal to 3/10. Additional ADL Goal #2: Pt will locate items left of midline in the hallway or at the sink with 75% accurracy and no more than min instructional cueing for eye and head turns.  Plan      Co-evaluation    PT/OT/SLP Co-Evaluation/Treatment: Yes Reason for Co-Treatment: Complexity of the patient's impairments (multi-system involvement);For patient/therapist safety;To address functional/ADL transfers   OT goals addressed during session: ADL's and self-care      AM-PAC OT 6 Clicks Daily Activity     Outcome Measure   Help from another person eating meals?: Total Help from another person taking care of personal grooming?: A Lot Help from another person toileting, which includes using toliet, bedpan, or urinal?: Total Help from another person bathing (including washing, rinsing, drying)?: A Lot Help from another person to put on and taking off regular upper body clothing?: A Lot Help from another person to put on and taking off regular lower body clothing?: Total  6 Click Score: 9    End of Session    OT Visit Diagnosis: Unsteadiness on feet (R26.81);Muscle weakness (generalized) (M62.81);Dizziness and giddiness (R42)   Activity Tolerance Patient limited by fatigue   Patient Left with family/visitor present;in bed;with bed alarm set;with call bell/phone  within reach   Nurse Communication Mobility status        Time: 4098-1191 OT Time Calculation (min): 25 min  Charges: OT General Charges $OT Visit: 1 Visit OT Treatments $Self Care/Home Management : 8-22 mins  Veryl Gottron, OTR/L Acute Rehabilitation Services Office [phone] Secure Chat Communication Preferred   Starla Easterly 06/18/2024, 2:03 PM

## 2024-06-19 DIAGNOSIS — I639 Cerebral infarction, unspecified: Secondary | ICD-10-CM | POA: Diagnosis not present

## 2024-06-19 NOTE — Progress Notes (Signed)
 Speech Language Pathology Treatment: Dysphagia  Patient Details Name: Gabriela Moyer MRN: [id number] DOB: 01/01/1941 Today's Date: 06/19/2024 Time: 9160-9094 SLP Time Calculation (min) (ACUTE ONLY): 26 min  Assessment / Plan / Recommendation Clinical Impression  SLP followed up for PO readiness. Pt has had waxing and waning mentation since admission but was alert and following commands this am. Nursing present and administered PO meds whole in puree (with cueing for not to chew tablets, pt able to tolerate without difficulty). Provided diligent oral care upon arriving, pt with some xerostomia, improving post oral care. Pt consumed ice chips, water via cup and straw sip, puree, and solid POs. Pt with adequate mastication and exhibited delayed throat clear x1 and cough x1. Vocal quality remained clear. No overt s/sx of aspiration. Recommend dysphagia 3 (mechanical soft) and thin liquids with meds whole in puree. Will closely monitor for diet tolerance. Recommend full supervision with POs in setting of cognitive deficits and waxing and waning mentation.    HPI HPI: Gabriela Moyer is an 83 y.o. female with who presented with her typical migraine headache symptoms along with nausea, vertigo and L hemianopsia. MRI Brain with a R PCA stroke and leptomeningeal enhancement within the high posterior left frontoparietal region. 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 She was noted to be delirious in the hospital with waxing and waning mentation. She underwent brain biopsy and results are pending. Pt has been followed by SLP since admission for cognition and dysphagia.      SLP Plan  Continue with current plan of care          Recommendations  Diet recommendations: Dysphagia 3 (mechanical soft);Thin liquid Liquids provided via: Straw;Cup Medication Administration: Whole meds with puree Supervision:  Staff to assist with self feeding;Full supervision/cueing for compensatory strategies Compensations: Slow rate;Small sips/bites Postural Changes and/or Swallow Maneuvers: Seated upright 90 degrees                  Oral care QID   Frequent or constant Supervision/Assistance Dysphagia, unspecified (R13.10)     Continue with current plan of care     Mitzie HUNT MA, CCC-SLP Acute Rehabilitation Services    06/19/2024, 9:12 AM

## 2024-06-19 NOTE — Progress Notes (Signed)
   Providing Compassionate, Quality Care - Together   Subjective: Patient is more alert today.  Objective: Vital signs in last 24 hours: Temp:  [97.1 F (36.2 C)-98.1 F (36.7 C)] 98 F (36.7 C) (06/21 1120) Pulse Rate:  [53-95] 55 (06/21 1120) Resp:  [9-17] 17 (06/21 1120) BP: (120-152)/(42-67) 139/43 (06/21 1120) SpO2:  [96 %-100 %] 98 % (06/21 1120)  Intake/Output from previous day: 06/20 0701 - 06/21 0700 In: 279.8 [I.V.:217.3; IV Piggyback:62.5] Out: -  Intake/Output this shift: No intake/output data recorded.  Alert Speech slurred, delayed responses PERRLA MAE, decreased strength on the right Incision is clean, dry, and intact  Lab Results: Recent Labs    06/17/24 0442 06/18/24 0505  WBC 9.9 10.3  HGB 13.9 13.2  HCT 41.7 40.2  PLT 178 158   BMET Recent Labs    06/17/24 0442 06/18/24 0505  NA 138 139  K 4.1 4.3  CL 106 105  CO2 25 26  GLUCOSE 128* 143*  BUN 13 14  CREATININE 1.00 0.88  CALCIUM  9.2 9.3    Studies/Results: EEG adult Result Date: 06/18/2024 Shelton Arlin KIDD, MD     06/18/2024  9:22 AM Patient Name: Gabriela Moyer MRN: [id number] Epilepsy Attending: Arlin KIDD Shelton Referring Physician/Provider: Khaliqdina, Salman, MD Date: [id number] Duration: 25.37mins  Patient history: 83 y.o. female with visual disturbance. EEG to evaluate for seizure.  Level of alertness: comatose/ lethargic  AEDs during EEG study: LEV, VPA  Technical aspects: This EEG study was done with scalp electrodes positioned according to the 10-20 International system of electrode placement. Electrical activity was reviewed with band pass filter of 1-70Hz , sensitivity of 7 uV/mm, display speed of 42mm/sec with a 60Hz  notched filter applied as appropriate. EEG data were recorded continuously and digitally stored.  Video monitoring was available and reviewed as appropriate. C3 channel was off during recording du to technical issues  Description:  EEG showed continuous generalized 3  to 5 Hz theta-delta slowing admixed with intermittent 13-15hz  beta activity. Hyperventilation and photic stimulation were not performed.    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 No seizures or epileptiform discharges were noted.  Arlin KIDD Shelton    Assessment/Plan: Patient underwent left bur hole for open brain biopsy by Dr. Mavis on 06/17/2024. Pathology is still pending.   LOS: 13 days   -Continue supportive efforts.  I am in communication with my attending and they agree with the plan for this patient.   Gerard Beck, DNP, AGNP-C Nurse Practitioner  [facility] [address], [address], [place], [place] [address] P: [phone]    F: [phone]  06/19/2024, 12:19 PM

## 2024-06-19 NOTE — Plan of Care (Signed)
  Problem: Pain Managment: Goal: General experience of comfort will improve and/or be controlled Outcome: Not Progressing

## 2024-06-19 NOTE — Progress Notes (Signed)
 PROGRESS NOTE  MICHE LOUGHRIDGE FMW:[id number] DOB: May 19, 1941 DOA: 06/06/2024 PCP: Vernadine Charlie ORN, MD   LOS: 13 days   Brief Narrative / Interim history: Gabriela Moyer is a 83 y.o. female with medical history significant of chronic migraine, essential hypertension, hyperlipidemia, extensive  PAD of subclavian, axillary, brachial and renal artery status post right renal artery stent 2010, hypothyroidism and paroxysmal afibrillation not on Xarelto  due to history of GI bleed recent emergency department complaining of headache for 4 days which is right-sided with associated vertigo, some vision change with the onset of the headache. EEG with focal seizures.  MRI with leptomeningeal/pial-arachnoid enhancement in posterior L frontal lobe and high left parietal lobe.  Workup ongoing with assistance of neurology.  Underwent LP, status post open biopsy by neurosurgery on 6/19.  She was monitored in the ICU 6/20 and back to the hospitalist service 6/21  Subjective / 24h Interval events: She is awake this morning, slow to respond but tells me she is feeling well, denies any headaches, denies any shortness of breath  Assesement and Plan: Principal Problem:   Ischemic cerebrovascular accident (CVA) (HCC) Active Problems:   Headache, right-sided blurry vision and dizziness   Migraine headache   AKI (acute kidney injury) (HCC)   Essential hypertension   Hyperlipidemia   History of migraine   PVD (peripheral vascular disease) (HCC)   History of stent insertion of renal artery   History of paroxysmal atrial tachycardia   History of GI bleed   Principal problem Acute CVA, Leptomeinigeal/pial-arachnoid enhancement in the posterior L frontal lobe and high left parietal lobe, Focal seizures  -patient initially presented to the hospital with migraines, headache, right visual field cut.  She underwent an MRI of the brain which showed leptomeningeal/pial-arachnoid enhancement in the posterior L frontal lobe  and high left parietal lobe corresponding to an area of abnormality on CT - additional region of infarct in the R occipital lobe with associated leptomeningeal/pial arachnoid enhancement.  Differential is broad including CNS lymphoma, vasculitis, leptomeningeal carcinomatosis, TB.  Recent EEG without definitive seizures, now discontinued currently on Keppra  1500 mg BID and Depakote  750 mg Q12 - LP fairly unremarkable, AFB smear negative, glucose was normal, 1 WBC, 2 RBC, negative crypto antigen, negative meningitis/encephalitis panel.  Negative quant gold, negative RF, negative aldolase, ANCA negative, CRP, C3/C4 wnl, Scl-70 negative, anti CCP wnl, sed rate wnl, ENA negative, anti-jo 1 wnl.  Cytology (no malignant cells).  - Neurosurgery also following, status post biopsy 6/19.  Results pending  Active problems Goals of Care -DNR   Acute Metabolic Encephalopathy -Mental status is fluctuating . MRI showed interval evolution of early subacute R PCA distribution infarct.  No significant interval vhange in appearance of leptomeningeal enhancement within the high posterior L frontoparietal region.  Subtly increased patchy flair signal abnormality with few foci of enhancement involving the L occipital lobe.  Suspect related to above process - unclear what is causing rapid worsening. Delirium precautions -  EEG 6/16 with periodic discharges with triphasic morphology - moderate diffuse encephalopathy - no seizures noted.  Repeat EEG postoperatively 6/20 showed moderate to severe diffuse encephalopathy without seizures or epileptiform discharges noted  Possible UTI -unclear symptoms given confusion, but favor treating.  Microbiology showed Klebsiella, she is now status post completed treatment  Hypokalemia - K normal at 4.3.  Continue to monitor intermittently  Hypothyroidism - TSH high at 17, suggesting synthroid  underdosed.  Synthroid  increased to 50 mcg, repeat follow-up should be done in 4 weeks  Acute  migraine headache with associated nausea - Continue Fioricet  as needed. Tigan  for nausea.    Paroxysmal Afib - RVR resolved. Decreased home dose Toprol  to xl 25 daily. Not on anticoagulation due to gi bleed. Continue telemetry monitoring.   QT prolongation -Avoid QT prolonging drugs. Continue Tigan  as needed for nausea.   Hypotension - Orthostatic vitals positive. Low dose Toprol  xl 25 daily as she has Afib. Monitor vitals closely.  Scheduled Meds:  atorvastatin   40 mg Oral Daily   Chlorhexidine  Gluconate Cloth  6 each Topical Daily   vitamin B-12  1,000 mcg Oral Daily   dexamethasone  (DECADRON ) injection  4 mg Intravenous Q6H   Followed by   dexamethasone  (DECADRON ) injection  4 mg Intravenous Q8H   divalproex   750 mg Oral Q12H   famotidine   40 mg Oral Daily   levETIRAcetam   1,500 mg Intravenous Q12H   Or   levETIRAcetam   1,500 mg Oral Q12H   levothyroxine   50 mcg Oral Daily   metoprolol  succinate  25 mg Oral Daily   potassium chloride   20 mEq Oral Daily   Continuous Infusions:  sodium chloride  Stopped (06/07/24 1418)   valproate sodium  750 mg (06/18/24 2209)   PRN Meds:.acetaminophen  **OR** acetaminophen  (TYLENOL ) oral liquid 160 mg/5 mL **OR** acetaminophen , diphenhydrAMINE , labetalol , ondansetron  **OR** ondansetron  (ZOFRAN ) IV, promethazine , senna-docusate  Current Outpatient Medications  Medication Instructions   amLODipine  (NORVASC ) 5 mg, Oral, Daily   clopidogrel  (PLAVIX ) 75 mg, Oral, Daily   ezetimibe  (ZETIA ) 10 MG tablet TAKE 1 TABLET(10 MG) BY MOUTH DAILY   hydrochlorothiazide  (HYDRODIURIL ) 25 MG tablet TAKE 1 TABLET(25 MG) BY MOUTH DAILY   levothyroxine  (SYNTHROID , LEVOTHROID) 25 MCG tablet 1 tablet, Daily   metoprolol  succinate (TOPROL -XL) 100 MG 24 hr tablet TAKE 1 TABLET(100 MG) BY MOUTH DAILY   metoprolol  tartrate (LOPRESSOR ) 50 MG tablet Take 2 hours prior to procedure   pravastatin  (PRAVACHOL ) 40 mg, Oral, Every evening   rosuvastatin (CRESTOR) 5 mg, Oral,  Daily PRN    Diet Orders (From admission, onward)     Start     Ordered   06/19/24 0902  DIET DYS 3 Room service appropriate? Yes with Assist; Fluid consistency: Thin  Diet effective now       Comments: Meds whole in puree  Question Answer Comment  Room service appropriate? Yes with Assist   Fluid consistency: Thin      06/19/24 0902            DVT prophylaxis: SCDs Start: 06/17/24 1417 Place and maintain sequential compression device Start: 06/07/24 1849   Lab Results  Component Value Date   PLT 158 06/18/2024      Code Status: Limited: Do not attempt resuscitation (DNR) -DNR-LIMITED -Do Not Intubate/DNI   Family Communication: No family at bedside  Status is: Inpatient Remains inpatient appropriate because: severity of illness  Level of care: Progressive  Consultants:  Neurology Neurosurgery   Objective: Vitals:   06/18/24 2100 06/18/24 2200 06/18/24 2317 06/19/24 0732  BP: (!) 120/58 (!) 122/45 (!) 152/67 (!) 145/42  Pulse: 65 73 95 66  Resp: 17 (!) 9 13 16   Temp:   97.8 F (36.6 C) 98.1 F (36.7 C)  TempSrc:   Oral Oral  SpO2: 98% 96% 100% 100%  Weight:      Height:        Intake/Output Summary (Last 24 hours) at 06/19/2024 1019 Last data filed at 06/18/2024 1300 Gross per 24 hour  Intake 279.78 ml  Output --  Net 279.78 ml   Wt Readings from Last 3 Encounters:  06/17/24 70.3 kg  07/02/23 74 kg  07/03/22 77.6 kg    Examination:  Constitutional: NAD Eyes: lids and conjunctivae normal, no scleral icterus ENMT: mmm Neck: normal, supple Respiratory: clear to auscultation bilaterally, no wheezing, no crackles. Normal respiratory effort.  Cardiovascular: Regular rate and rhythm, no murmurs / rubs / gallops. No LE edema. Abdomen: soft, no distention, no tenderness. Bowel sounds positive.     Data Reviewed: I have independently reviewed following labs and imaging studies   CBC Recent Labs  Lab 06/14/24 0443 06/15/24 0438 06/16/24 0457  06/17/24 0442 06/18/24 0505  WBC 11.4* 7.9 8.1 9.9 10.3  HGB 14.6 13.7 14.4 13.9 13.2  HCT 43.1 41.1 43.5 41.7 40.2  PLT 235 195 170 178 158  MCV 90.4 91.5 92.6 91.2 93.9  MCH 30.6 30.5 30.6 30.4 30.8  MCHC 33.9 33.3 33.1 33.3 32.8  RDW 12.0 12.0 12.0 12.2 12.3  LYMPHSABS 1.2 1.1 1.1  --   --   MONOABS 0.8 0.9 1.1*  --   --   EOSABS 0.1 0.1 0.1  --   --   BASOSABS 0.0 0.0 0.0  --   --     Recent Labs  Lab 06/13/24 1151 06/14/24 0443 06/15/24 0438 06/16/24 0457 06/17/24 0442 06/18/24 0505  NA 138 139 138 137 138 139  K 4.2 3.8 3.8 3.9 4.1 4.3  CL 100 101 105 105 106 105  CO2 25 25 25 23 25 26   GLUCOSE 98 79 90 89 128* 143*  BUN 17 22 25* 16 13 14   CREATININE 0.98 1.14* 1.19* 1.09* 1.00 0.88  CALCIUM  9.6 9.2 9.0 8.9 9.2 9.3  AST  --  48* 49* 52* 61* 45*  ALT  --  35 35 35 39 31  ALKPHOS  --  163* 146* 164* 184* 149*  BILITOT  --  0.7 0.6 0.6 0.5 0.2  ALBUMIN  --  2.9* 2.7* 2.7* 2.6* 2.4*  MG  --  1.5* 2.3 2.1 1.9  --   AMMONIA 32  --   --   --   --   --     ------------------------------------------------------------------------------------------------------------------ No results for input(s): CHOL, HDL, LDLCALC, TRIG, CHOLHDL, LDLDIRECT in the last 72 hours.  Lab Results  Component Value Date   HGBA1C 5.5 06/07/2024   ------------------------------------------------------------------------------------------------------------------ No results for input(s): TSH, T4TOTAL, T3FREE, THYROIDAB in the last 72 hours.  Invalid input(s): FREET3  Cardiac Enzymes No results for input(s): CKMB, TROPONINI, MYOGLOBIN in the last 168 hours.  Invalid input(s): CK ------------------------------------------------------------------------------------------------------------------ No results found for: BNP  CBG: Recent Labs  Lab 06/13/24 0049 06/13/24 1004  GLUCAP 78 90    Recent Results (from the past 240 hours)  Acid Fast Smear (AFB)      Status: None   Collection Time: 06/12/24  9:36 AM   Specimen: CSF  Result Value Ref Range Status   AFB Specimen Processing Concentration  Final   Acid Fast Smear Negative  Final    Comment: (NOTE) Performed At: [facility] [address] [place], [place] [id number] Jennette Shorter MD Ey:[id number]    Source (AFB) CSF  Final    Comment: Performed at [facility], [address], [place], [place] [address]  CSF culture w Gram Stain     Status: None   Collection Time: 06/12/24  1:10 PM   Specimen: CSF; Cerebrospinal Fluid  Result Value Ref Range Status  Specimen Description CSF  Final   Special Requests NONE  Final   Gram Stain WBC SEEN NO ORGANISMS SEEN CYTOSPIN SMEAR   Final   Culture   Final    NO GROWTH 3 DAYS Performed at [facility], [address] [place], [place] [address]    Report Status 06/15/2024 FINAL  Final  MT-RIF NAA W Cult, Non-Sputum     Status: None (Preliminary result)   Collection Time: 06/12/24  1:10 PM   Specimen: CSF; Cerebrospinal Fluid  Result Value Ref Range Status   Source CSF  Final    Comment: Performed at [facility], [address], [place], [place] [address]   Specimen Source Comment  Final    Comment: Cerebrospinal fluid (CSF)   M tuberculosis complex NOT DETECTED NOT DETECTED Final    Comment: (NOTE) This test was developed and its performance characteristics determined by [facility]. It has not been cleared or approved by the Food and Drug Administration.    Rifampin Not applicable NOT DETECTED Final    Comment: (NOTE) This test was developed and its performance characteristics determined by [facility]. It has not been cleared or approved by the Food and Drug Administration.    AFB Specimen Processing Concentration  Final    Comment: (NOTE) Performed At: [facility] [address] [place], [place] [id number] Jennette Shorter MD Ey:[id number]    Acid Fast Culture PENDING  Incomplete  Urine Culture  (for pregnant, neutropenic or urologic patients or patients with an indwelling urinary catheter)     Status: Abnormal   Collection Time: 06/13/24  5:35 PM   Specimen: Urine, Clean Catch  Result Value Ref Range Status   Specimen Description URINE, CLEAN CATCH  Final   Special Requests   Final    NONE Performed at [facility], [address], [place], [place] [address]    Culture >=100,000 COLONIES/mL KLEBSIELLA PNEUMONIAE (A)  Final   Report Status 06/15/2024 FINAL  Final   Organism ID, Bacteria KLEBSIELLA PNEUMONIAE (A)  Final      Susceptibility   Klebsiella pneumoniae - MIC*    AMPICILLIN >=32 RESISTANT Resistant     CEFAZOLIN  <=4 SENSITIVE Sensitive     CEFEPIME <=0.12 SENSITIVE Sensitive     CEFTRIAXONE  <=0.25 SENSITIVE Sensitive     CIPROFLOXACIN <=0.25 SENSITIVE Sensitive     GENTAMICIN <=1 SENSITIVE Sensitive     IMIPENEM 1 SENSITIVE Sensitive     NITROFURANTOIN 64 INTERMEDIATE Intermediate     TRIMETH/SULFA <=20 SENSITIVE Sensitive     AMPICILLIN/SULBACTAM 16 INTERMEDIATE Intermediate     PIP/TAZO 8 SENSITIVE Sensitive ug/mL    * >=100,000 COLONIES/mL KLEBSIELLA PNEUMONIAE  Aerobic/Anaerobic Culture w Gram Stain (surgical/deep wound)     Status: None (Preliminary result)   Collection Time: 06/17/24 12:33 PM   Specimen: PATH Brain biopsy; Tissue  Result Value Ref Range Status   Specimen Description BIOPSY  Final   Special Requests LEFT BRAIN  Final   Gram Stain NO WBC SEEN NO ORGANISMS SEEN   Final   Culture   Final    NO GROWTH 2 DAYS NO ANAEROBES ISOLATED; CULTURE IN PROGRESS FOR 5 DAYS Performed at [facility], [address], [place], [place] [address]    Report Status PENDING  Incomplete     Radiology Studies: No results found.   Nilda Fendt, MD, PhD [facility]  Between 7 am - 7 pm I am available, please contact me via Amion (for  emergencies) or Securechat (non urgent messages)  Between 7 pm - 7 am I am not available, please  contact night coverage MD/APP via Amion

## 2024-06-20 DIAGNOSIS — I639 Cerebral infarction, unspecified: Secondary | ICD-10-CM | POA: Diagnosis not present

## 2024-06-20 NOTE — Progress Notes (Signed)
 PROGRESS NOTE  Gabriela Moyer FMW:[id number] DOB: Nov 16, 1941 DOA: 06/06/2024 PCP: Vernadine Charlie ORN, MD   LOS: 14 days   Brief Narrative / Interim history: Gabriela Moyer is a 83 y.o. female with medical history significant of chronic migraine, essential hypertension, hyperlipidemia, extensive  PAD of subclavian, axillary, brachial and renal artery status post right renal artery stent 2010, hypothyroidism and paroxysmal afibrillation not on Xarelto  due to history of GI bleed recent emergency department complaining of headache for 4 days which is right-sided with associated vertigo, some vision change with the onset of the headache. EEG with focal seizures.  MRI with leptomeningeal/pial-arachnoid enhancement in posterior L frontal lobe and high left parietal lobe.  Workup ongoing with assistance of neurology.  Underwent LP, status post open biopsy by neurosurgery on 6/19.  She was monitored in the ICU 6/20 and back to the hospitalist service 6/21  Subjective / 24h Interval events: More confused today, very emotional this morning as she has the mittens on  Assesement and Plan: Principal Problem:   Ischemic cerebrovascular accident (CVA) (HCC) Active Problems:   Headache, right-sided blurry vision and dizziness   Migraine headache   AKI (acute kidney injury) (HCC)   Essential hypertension   Hyperlipidemia   History of migraine   PVD (peripheral vascular disease) (HCC)   History of stent insertion of renal artery   History of paroxysmal atrial tachycardia   History of GI bleed   Principal problem Acute CVA, Leptomeinigeal/pial-arachnoid enhancement in the posterior L frontal lobe and high left parietal lobe, Focal seizures  -patient initially presented to the hospital with migraines, headache, right visual field cut.  She underwent an MRI of the brain which showed leptomeningeal/pial-arachnoid enhancement in the posterior L frontal lobe and high left parietal lobe corresponding to an area of  abnormality on CT - additional region of infarct in the R occipital lobe with associated leptomeningeal/pial arachnoid enhancement.  Differential is broad including CNS lymphoma, vasculitis, leptomeningeal carcinomatosis, TB.  Recent EEG without definitive seizures, now discontinued currently on Keppra  1500 mg BID and Depakote  750 mg Q12 - LP fairly unremarkable, AFB smear negative, glucose was normal, 1 WBC, 2 RBC, negative crypto antigen, negative meningitis/encephalitis panel.  Negative quant gold, negative RF, negative aldolase, ANCA negative, CRP, C3/C4 wnl, Scl-70 negative, anti CCP wnl, sed rate wnl, ENA negative, anti-jo 1 wnl.  Cytology (no malignant cells).  - Neurosurgery also following, status post biopsy 6/19.  Biopsy results pending  Active problems Goals of Care -DNR   Acute Metabolic Encephalopathy -Mental status is fluctuating. MRI showed interval evolution of early subacute R PCA distribution infarct.  No significant interval vhange in appearance of leptomeningeal enhancement within the high posterior L frontoparietal region.  Subtly increased patchy flair signal abnormality with few foci of enhancement involving the L occipital lobe.  Suspect related to above process - unclear what is causing rapid worsening. Delirium precautions -  EEG 6/16 with periodic discharges with triphasic morphology - moderate diffuse encephalopathy - no seizures noted.  Repeat EEG postoperatively 6/20 showed moderate to severe diffuse encephalopathy without seizures or epileptiform discharges noted - More confused today, continue supportive care  Possible UTI -unclear symptoms given confusion, but favor treating.  Microbiology showed Klebsiella, she is now status post completed treatment  Hypokalemia - K normal at 4.3.  Continue to monitor intermittently, repeat tomorrow  Hypothyroidism - TSH high at 17, suggesting synthroid  underdosed.  Synthroid  increased to 50 mcg, repeat follow-up should be done in 4  weeks  Acute migraine headache with associated nausea - Continue Fioricet  as needed. Tigan  for nausea.    Paroxysmal Afib - RVR resolved. Decreased home dose Toprol  to xl 25 daily. Not on anticoagulation due to gi bleed. Continue telemetry monitoring.   QT prolongation -Avoid QT prolonging drugs. Continue Tigan  as needed for nausea.   Hypotension - Orthostatic vitals positive. Low dose Toprol  xl 25 daily as she has Afib. Monitor vitals closely.  Scheduled Meds:  atorvastatin   40 mg Oral Daily   vitamin B-12  1,000 mcg Oral Daily   dexamethasone  (DECADRON ) injection  4 mg Intravenous Q8H   divalproex   750 mg Oral Q12H   famotidine   40 mg Oral Daily   levETIRAcetam   1,500 mg Intravenous Q12H   Or   levETIRAcetam   1,500 mg Oral Q12H   levothyroxine   50 mcg Oral Daily   metoprolol  succinate  25 mg Oral Daily   potassium chloride   20 mEq Oral Daily   Continuous Infusions:  sodium chloride  Stopped (06/07/24 1418)   valproate sodium  750 mg (06/18/24 2209)   PRN Meds:.acetaminophen  **OR** acetaminophen  (TYLENOL ) oral liquid 160 mg/5 mL **OR** acetaminophen , diphenhydrAMINE , labetalol , ondansetron  **OR** ondansetron  (ZOFRAN ) IV, promethazine , senna-docusate  Current Outpatient Medications  Medication Instructions   amLODipine  (NORVASC ) 5 mg, Oral, Daily   clopidogrel  (PLAVIX ) 75 mg, Oral, Daily   ezetimibe  (ZETIA ) 10 MG tablet TAKE 1 TABLET(10 MG) BY MOUTH DAILY   hydrochlorothiazide  (HYDRODIURIL ) 25 MG tablet TAKE 1 TABLET(25 MG) BY MOUTH DAILY   levothyroxine  (SYNTHROID , LEVOTHROID) 25 MCG tablet 1 tablet, Daily   metoprolol  succinate (TOPROL -XL) 100 MG 24 hr tablet TAKE 1 TABLET(100 MG) BY MOUTH DAILY   metoprolol  tartrate (LOPRESSOR ) 50 MG tablet Take 2 hours prior to procedure   pravastatin  (PRAVACHOL ) 40 mg, Oral, Every evening   rosuvastatin (CRESTOR) 5 mg, Oral, Daily PRN    Diet Orders (From admission, onward)     Start     Ordered   06/19/24 0902  DIET DYS 3 Room  service appropriate? Yes with Assist; Fluid consistency: Thin  Diet effective now       Comments: Meds whole in puree  Question Answer Comment  Room service appropriate? Yes with Assist   Fluid consistency: Thin      06/19/24 0902            DVT prophylaxis: SCDs Start: 06/17/24 1417 Place and maintain sequential compression device Start: 06/07/24 1849   Lab Results  Component Value Date   PLT 158 06/18/2024      Code Status: Limited: Do not attempt resuscitation (DNR) -DNR-LIMITED -Do Not Intubate/DNI   Family Communication: No family at bedside  Status is: Inpatient Remains inpatient appropriate because: severity of illness  Level of care: Progressive  Consultants:  Neurology Neurosurgery   Objective: Vitals:   06/19/24 2009 06/19/24 2317 06/20/24 0326 06/20/24 0724  BP: 103/76 (!) 153/55 125/82 130/79  Pulse: 64 64 70 80  Resp: 18 20 19 16   Temp: 98.1 F (36.7 C) 98 F (36.7 C) 98.8 F (37.1 C) 98.1 F (36.7 C)  TempSrc: Oral  Oral Oral  SpO2: 95% 96% 97% 98%  Weight:      Height:       No intake or output data in the 24 hours ending 06/20/24 1037  Wt Readings from Last 3 Encounters:  06/17/24 70.3 kg  07/02/23 74 kg  07/03/22 77.6 kg    Examination:  Constitutional: NAD Eyes: lids and conjunctivae normal, no scleral icterus ENMT: mmm  Neck: normal, supple Respiratory: clear to auscultation bilaterally, no wheezing, no crackles.  Cardiovascular: Regular rate and rhythm, no murmurs / rubs / gallops. No LE edema. Abdomen: soft, no distention, no tenderness. Bowel sounds positive.     Data Reviewed: I have independently reviewed following labs and imaging studies   CBC Recent Labs  Lab 06/14/24 0443 06/15/24 0438 06/16/24 0457 06/17/24 0442 06/18/24 0505  WBC 11.4* 7.9 8.1 9.9 10.3  HGB 14.6 13.7 14.4 13.9 13.2  HCT 43.1 41.1 43.5 41.7 40.2  PLT 235 195 170 178 158  MCV 90.4 91.5 92.6 91.2 93.9  MCH 30.6 30.5 30.6 30.4 30.8  MCHC  33.9 33.3 33.1 33.3 32.8  RDW 12.0 12.0 12.0 12.2 12.3  LYMPHSABS 1.2 1.1 1.1  --   --   MONOABS 0.8 0.9 1.1*  --   --   EOSABS 0.1 0.1 0.1  --   --   BASOSABS 0.0 0.0 0.0  --   --     Recent Labs  Lab 06/13/24 1151 06/14/24 0443 06/15/24 0438 06/16/24 0457 06/17/24 0442 06/18/24 0505  NA 138 139 138 137 138 139  K 4.2 3.8 3.8 3.9 4.1 4.3  CL 100 101 105 105 106 105  CO2 25 25 25 23 25 26   GLUCOSE 98 79 90 89 128* 143*  BUN 17 22 25* 16 13 14   CREATININE 0.98 1.14* 1.19* 1.09* 1.00 0.88  CALCIUM  9.6 9.2 9.0 8.9 9.2 9.3  AST  --  48* 49* 52* 61* 45*  ALT  --  35 35 35 39 31  ALKPHOS  --  163* 146* 164* 184* 149*  BILITOT  --  0.7 0.6 0.6 0.5 0.2  ALBUMIN  --  2.9* 2.7* 2.7* 2.6* 2.4*  MG  --  1.5* 2.3 2.1 1.9  --   AMMONIA 32  --   --   --   --   --     ------------------------------------------------------------------------------------------------------------------ No results for input(s): CHOL, HDL, LDLCALC, TRIG, CHOLHDL, LDLDIRECT in the last 72 hours.  Lab Results  Component Value Date   HGBA1C 5.5 06/07/2024   ------------------------------------------------------------------------------------------------------------------ No results for input(s): TSH, T4TOTAL, T3FREE, THYROIDAB in the last 72 hours.  Invalid input(s): FREET3  Cardiac Enzymes No results for input(s): CKMB, TROPONINI, MYOGLOBIN in the last 168 hours.  Invalid input(s): CK ------------------------------------------------------------------------------------------------------------------ No results found for: BNP  CBG: No results for input(s): GLUCAP in the last 168 hours.   Recent Results (from the past 240 hours)  Acid Fast Smear (AFB)     Status: None   Collection Time: 06/12/24  9:36 AM   Specimen: CSF  Result Value Ref Range Status   AFB Specimen Processing Concentration  Final   Acid Fast Smear Negative  Final    Comment: (NOTE) Performed At: [facility] [address] [place], [place] [id number] Jennette Shorter MD Ey:[id number]    Source (AFB) CSF  Final    Comment: Performed at [facility], [address], [place], [place] [address]  CSF culture w Gram Stain     Status: None   Collection Time: 06/12/24  1:10 PM   Specimen: CSF; Cerebrospinal Fluid  Result Value Ref Range Status   Specimen Description CSF  Final   Special Requests NONE  Final   Gram Stain WBC SEEN NO ORGANISMS SEEN CYTOSPIN SMEAR   Final   Culture   Final    NO GROWTH 3 DAYS Performed at [facility], [address] [place], [place] [address]    Report Status 06/15/2024 FINAL  Final  MT-RIF NAA W Cult, Non-Sputum     Status: None (Preliminary result)   Collection Time: 06/12/24  1:10 PM   Specimen: CSF; Cerebrospinal Fluid  Result Value Ref Range Status   Source CSF  Final    Comment: Performed at [facility], [address], [place], [place] [address]   Specimen Source Comment  Final    Comment: Cerebrospinal fluid (CSF)   M tuberculosis complex NOT DETECTED NOT DETECTED Final    Comment: (NOTE) This test was developed and its performance characteristics determined by [facility]. It has not been cleared or approved by the Food and Drug Administration.    Rifampin Not applicable NOT DETECTED Final    Comment: (NOTE) This test was developed and its performance characteristics determined by [facility]. It has not been cleared or approved by the Food and Drug Administration.    AFB Specimen Processing Concentration  Final    Comment: (NOTE) Performed At: [facility] [address] [place], [place] [id number] Jennette Shorter MD Ey:[id number]    Acid Fast Culture PENDING  Incomplete  Urine Culture (for pregnant, neutropenic or urologic patients or patients with an indwelling urinary catheter)     Status: Abnormal   Collection Time: 06/13/24  5:35 PM   Specimen: Urine, Clean Catch  Result Value Ref Range Status    Specimen Description URINE, CLEAN CATCH  Final   Special Requests   Final    NONE Performed at [facility], [address] [place], [place] [address]    Culture >=100,000 COLONIES/mL KLEBSIELLA PNEUMONIAE (A)  Final   Report Status 06/15/2024 FINAL  Final   Organism ID, Bacteria KLEBSIELLA PNEUMONIAE (A)  Final      Susceptibility   Klebsiella pneumoniae - MIC*    AMPICILLIN >=32 RESISTANT Resistant     CEFAZOLIN  <=4 SENSITIVE Sensitive     CEFEPIME <=0.12 SENSITIVE Sensitive     CEFTRIAXONE  <=0.25 SENSITIVE Sensitive     CIPROFLOXACIN <=0.25 SENSITIVE Sensitive     GENTAMICIN <=1 SENSITIVE Sensitive     IMIPENEM 1 SENSITIVE Sensitive     NITROFURANTOIN 64 INTERMEDIATE Intermediate     TRIMETH/SULFA <=20 SENSITIVE Sensitive     AMPICILLIN/SULBACTAM 16 INTERMEDIATE Intermediate     PIP/TAZO 8 SENSITIVE Sensitive ug/mL    * >=100,000 COLONIES/mL KLEBSIELLA PNEUMONIAE  Aerobic/Anaerobic Culture w Gram Stain (surgical/deep wound)     Status: None (Preliminary result)   Collection Time: 06/17/24 12:33 PM   Specimen: PATH Brain biopsy; Tissue  Result Value Ref Range Status   Specimen Description BIOPSY  Final   Special Requests LEFT BRAIN  Final   Gram Stain NO WBC SEEN NO ORGANISMS SEEN   Final   Culture   Final    NO GROWTH 3 DAYS NO ANAEROBES ISOLATED; CULTURE IN PROGRESS FOR 5 DAYS Performed at [facility], [address] [place], [place] [address]    Report Status PENDING  Incomplete     Radiology Studies: No results found.   Nilda Fendt, MD, PhD [facility]  Between 7 am - 7 pm I am available, please contact me via Amion (for emergencies) or Securechat (non urgent messages)  Between 7 pm - 7 am I am not available, please contact night coverage MD/APP via Amion

## 2024-06-20 NOTE — Plan of Care (Signed)
   Problem: Health Behavior/Discharge Planning: Goal: Ability to manage health-related needs will improve Outcome: Not Progressing

## 2024-06-20 NOTE — Plan of Care (Signed)

## 2024-06-20 NOTE — Progress Notes (Signed)
 Patient  had to be placed in restraints after multiple attempts of trying to climb out of bed and pulled 2 Ivs out. Confused, status changed from A&O 4 to 1  Brayton Lye MD

## 2024-06-20 NOTE — Plan of Care (Signed)
  Problem: Education: Goal: Knowledge of disease or condition will improve Outcome: Not Progressing   Problem: Self-Care: Goal: Ability to participate in self-care as condition permits will improve Outcome: Not Progressing   Problem: Activity: Goal: Risk for activity intolerance will decrease Outcome: Not Progressing   Problem: Pain Managment: Goal: General experience of comfort will improve and/or be controlled Outcome: Not Progressing   Problem: Safety: Goal: Ability to remain free from injury will improve Outcome: Not Progressing

## 2024-06-21 DIAGNOSIS — I639 Cerebral infarction, unspecified: Secondary | ICD-10-CM | POA: Diagnosis not present

## 2024-06-21 LAB — COMPREHENSIVE METABOLIC PANEL WITH GFR
ALT: 21 U/L (ref 0–44)
AST: 33 U/L (ref 15–41)
Albumin: 2.2 g/dL — ABNORMAL LOW (ref 3.5–5.0)
Alkaline Phosphatase: 141 U/L — ABNORMAL HIGH (ref 38–126)
Anion gap: 8 (ref 5–15)
BUN: 27 mg/dL — ABNORMAL HIGH (ref 8–23)
CO2: 25 mmol/L (ref 22–32)
Calcium: 9.1 mg/dL (ref 8.9–10.3)
Chloride: 107 mmol/L (ref 98–111)
Creatinine, Ser: 1.03 mg/dL — ABNORMAL HIGH (ref 0.44–1.00)
GFR, Estimated: 54 mL/min — ABNORMAL LOW (ref >60)
Glucose, Bld: 120 mg/dL — ABNORMAL HIGH (ref 70–99)
Potassium: 4.7 mmol/L (ref 3.5–5.1)
Sodium: 140 mmol/L (ref 135–145)
Total Bilirubin: 0.3 mg/dL (ref 0.0–1.2)
Total Protein: 5.1 g/dL — ABNORMAL LOW (ref 6.5–8.1)

## 2024-06-21 LAB — MAGNESIUM: Magnesium: 1.8 mg/dL (ref 1.7–2.4)

## 2024-06-21 LAB — PARANEOPLASTIC AB
AGNA-1: NEGATIVE
Amphiphysin Antibody: NEGATIVE
Anti-Hu Ab: NEGATIVE
Anti-Ri Ab: NEGATIVE
Anti-Yo Ab: NEGATIVE
Antineruonal nuclear Ab Type 3: NEGATIVE
CASPR2 Antibody,Cell-based IFA: NEGATIVE
CRMP-5 IgG: NEGATIVE
Interpretation: NEGATIVE
LGI1 Antibody, Cell-based IFA: NEGATIVE
Purkinje Cell Cyto Ab Type 2: NEGATIVE
Purkinje Cell Cyto Ab Type Tr: NEGATIVE
VGCC Antibody: <1 pmol/L (ref 0.0–30.0)

## 2024-06-21 LAB — CBC
HCT: 38.5 % (ref 36.0–46.0)
Hemoglobin: 12.5 g/dL (ref 12.0–15.0)
MCH: 30.3 pg (ref 26.0–34.0)
MCHC: 32.5 g/dL (ref 30.0–36.0)
MCV: 93.2 fL (ref 80.0–100.0)
Platelets: 261 10*3/uL (ref 150–400)
RBC: 4.13 MIL/uL (ref 3.87–5.11)
RDW: 12.2 % (ref 11.5–15.5)
WBC: 12.7 10*3/uL — ABNORMAL HIGH (ref 4.0–10.5)
nRBC: 0 % (ref 0.0–0.2)

## 2024-06-21 NOTE — TOC Progression Note (Signed)
 Transition of Care [facility]) - Progression Note    Patient Details  Name: Gabriela Moyer MRN: [id number] Date of Birth: 1941/12/24  Transition of Care [facility]) CM/SW Contact  Andrez JULIANNA George, RN Phone Number: 06/21/2024, 12:04 PM  Clinical Narrative:     Awaiting paths from brain biopsy. Pt with some confusion. CIR following for potential admission. TOC following.   Expected Discharge Plan: IP Rehab Facility    Expected Discharge Plan and Services                                               Social Determinants of Health (SDOH) Interventions SDOH Screenings   Food Insecurity: No Food Insecurity (06/08/2024)  Housing: Low Risk  (06/08/2024)  Transportation Needs: No Transportation Needs (06/08/2024)  Utilities: Not At Risk (06/08/2024)  Social Connections: Socially Isolated (06/08/2024)  Tobacco Use: Medium Risk (06/06/2024)    Readmission Risk Interventions     No data to display

## 2024-06-21 NOTE — Progress Notes (Signed)
 Speech Language Pathology Treatment: Dysphagia;Cognitive-Linguistic  Patient Details Name: Gabriela Moyer MRN: [id number] DOB: [birth year] Today's Date: 06/21/2024 Time: 8562-8554 SLP Time Calculation (min) (ACUTE ONLY): 8 min  Assessment / Plan / Recommendation Clinical Impression  Pt seen for swallow and brief cognitive intervention. Drowsy but awake and consumed cup and straw sips thin with one minimal throat clear. Masticated graham cracker timely and without residue. If pt is not alert enough she is at greater risk of decreased airway protection. Pt was oriented to place and recalled month after hearing SLP say it with mild delay. She stated she came in with migraines and when asked further was able to state she had a stroke. Pt unable to problem solve day of the week given semantic cue- she needed a phrase completion cue. ST will continue.    HPI HPI: Gabriela Moyer is an 83 y.o. female with who presented with her typical migraine headache symptoms along with nausea, vertigo and L hemianopsia. MRI Brain with a R PCA stroke and leptomeningeal enhancement within the high posterior left frontoparietal region. 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 She was noted to be delirious in the hospital with waxing and waning mentation. She underwent brain biopsy and results are pending. Pt has been followed by SLP since admission for cognition and dysphagia.      SLP Plan  Continue with current plan of care          Recommendations  Diet recommendations: Dysphagia 3 (mechanical soft);Thin liquid Liquids provided via: Straw;Cup Medication Administration: Whole meds with puree Supervision: Staff to assist with self feeding;Full supervision/cueing for compensatory strategies Compensations: Slow rate;Small sips/bites Postural Changes and/or Swallow Maneuvers: Seated upright 90 degrees                 Rehab consult Oral care BID   Frequent or constant Supervision/Assistance Dysphagia, unspecified (R13.10);Cognitive communication deficit (M58.158)     Continue with current plan of care     Dustin Olam Bull  06/21/2024, 2:50 PM

## 2024-06-21 NOTE — Progress Notes (Signed)
 Occupational Therapy Treatment Patient Details Name: Gabriela Moyer MRN: [id number] DOB: 03-Nov-1941 Today's Date: 06/21/2024   History of present illness Pt is an 83 y/o F admitted on 06/06/24 after presenting with c/o HA x 4 days, as well as vertigo & vision changes. MRI brain w + w /o C which demonstrated leptomeningeal/pial-arachnoid enhancement in the posterior left frontal lobe and high left parietal lobe corresponding to area of abnormality on CT. 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 6/14 neuro status change & LP negative for acute findings. 6/19 bur hole for brain biopsy, results pending. PMH: chronic migraine, HTN, HLD, extensive PAD of subclavian, axillary, brachial & renal artery s/p R renal artery stent 2010, hypothyroidism, paroxysmal a-fib.   OT comments  Pt is making limited progress towards their acute OT goals. Session completed with PT to safety address functional mobility and activity tolerance. Pt continues to have low LOA with minimal improvement once sitting EOB. She followed a few 1 step commands and righted her sitting balance intermittently but ultimately needed up to max A for sitting balance. She tolerated standing 2x with mod A +2 but stated, sit back down after the first stand. Total A needed for grooming task as pt did not attempt to initiate or sustain face washing despite max cues and hand over hand assist. OT to continue to follow acutely to facilitate progress towards established goals. Pt will continue to benefit from intensive inpatient follow up therapy, >3 hours/day after discharge.       If plan is discharge home, recommend the following:  A little help with walking and/or transfers;A little help with bathing/dressing/bathroom;Assist for transportation;Help with stairs or ramp for entrance   Equipment Recommendations  None recommended by OT    Recommendations for Other Services Rehab consult    Precautions / Restrictions  Precautions Precautions: Fall Recall of Precautions/Restrictions: Impaired Precaution/Restrictions Comments: left visual field cut, orthostatic Restrictions Weight Bearing Restrictions Per Provider Order: No       Mobility Bed Mobility Overal bed mobility: Needs Assistance Bed Mobility: Supine to Sit, Sit to Supine     Supine to sit: Total assist, +2 for physical assistance, +2 for safety/equipment Sit to supine: Max assist, +2 for physical assistance, +2 for safety/equipment   General bed mobility comments: did not initiate getting OOB due to low LOA/lethargy.    Transfers Overall transfer level: Needs assistance Equipment used: 2 person hand held assist Transfers: Sit to/from Stand Sit to Stand: Mod assist, +2 physical assistance, +2 safety/equipment           General transfer comment: unable to progress to lateral stepping     Balance Overall balance assessment: Needs assistance Sitting-balance support: Feet supported Sitting balance-Leahy Scale: Poor Sitting balance - Comments: CGA-max A for sitting balance   Standing balance support: Bilateral upper extremity supported, During functional activity                               ADL either performed or assessed with clinical judgement   ADL Overall ADL's : Needs assistance/impaired     Grooming: Total assistance Grooming Details (indicate cue type and reason): hand over hand required, no initation                 Toilet Transfer: Total assistance;Stand-pivot Toilet Transfer Details (indicate cue type and reason): pt able to stand with mod A +2, did not attmpt to move her feet  despite maximal cues         Functional mobility during ADLs: Moderate assistance;+2 for physical assistance;+2 for safety/equipment General ADL Comments: limited by lethargy, low LOA and weakness    Extremity/Trunk Assessment Upper Extremity Assessment Upper Extremity Assessment: RUE deficits/detail;LUE  deficits/detail RUE Deficits / Details: reached across body to initiate rolling, attempted to use RUE for transfer. Otherwise difficult to assess due to LOA RUE Coordination: decreased fine motor;decreased gross motor LUE Deficits / Details: less active movement of L noted, difficult to assess due to low LOA LUE Sensation: decreased light touch;decreased proprioception LUE Coordination: decreased fine motor;decreased gross motor   Lower Extremity Assessment Lower Extremity Assessment: Defer to PT evaluation        Vision   Vision Assessment?: Yes Eye Alignment: Within Functional Limits Ocular Range of Motion: Within Functional Limits Alignment/Gaze Preference: Within Defined Limits Tracking/Visual Pursuits: Other (comment) Saccades: Additional head turns occurred during testing Convergence: Within functional limits Additional Comments: continue to have L field cut at midline, pt is able to locate stim in the L environment with significant cues. pt held eyes closed the majority of the session   Perception Perception Perception: Impaired Preception Impairment Details: Inattention/Neglect   Praxis Praxis Praxis: Impaired   Communication Communication Communication: Impaired Factors Affecting Communication: Difficulty expressing self   Cognition Arousal: Lethargic Behavior During Therapy: Flat affect Cognition: Difficult to assess Difficult to assess due to: Impaired communication   Awareness: Online awareness impaired     Executive functioning impairment (select all impairments): Problem solving OT - Cognition Comments: lethargic with some improvement once sitting upright. pt followed some simple 1 step commands. minimial verbalizations made, pt stated sit back down once standing                 Following commands: Impaired Following commands impaired: Follows one step commands inconsistently, Follows one step commands with increased time      Cueing   Cueing  Techniques: Verbal cues, Gestural cues, Tactile cues  Exercises      Shoulder Instructions       General Comments VSS    Pertinent Vitals/ Pain       Pain Assessment Pain Assessment: No/denies pain  Home Living                                          Prior Functioning/Environment              Frequency  Min 2X/week        Progress Toward Goals  OT Goals(current goals can now be found in the care plan section)  Progress towards OT goals: Progressing toward goals  Acute Rehab OT Goals Patient Stated Goal: to sit back down OT Goal Formulation: With patient Time For Goal Achievement: 06/07/24 Potential to Achieve Goals: Good ADL Goals Pt Will Perform Grooming: with supervision;standing Pt Will Perform Lower Body Dressing: with supervision;sit to/from stand Pt Will Transfer to Toilet: with supervision;ambulating;regular height toilet Pt Will Perform Toileting - Clothing Manipulation and hygiene: with supervision;sit to/from stand Pt Will Perform Tub/Shower Transfer: with supervision;Tub transfer;shower seat;ambulating;rolling walker Additional ADL Goal #1: Pt will complete ADLs with dizziness reported at less than or equal to 3/10. Additional ADL Goal #2: Pt will locate items left of midline in the hallway or at the sink with 75% accurracy and no more than min instructional cueing for eye and head turns.  Plan      Co-evaluation    PT/OT/SLP Co-Evaluation/Treatment: Yes Reason for Co-Treatment: Complexity of the patient's impairments (multi-system involvement);For patient/therapist safety;To address functional/ADL transfers   OT goals addressed during session: ADL's and self-care      AM-PAC OT 6 Clicks Daily Activity     Outcome Measure   Help from another person eating meals?: Total Help from another person taking care of personal grooming?: Total Help from another person toileting, which includes using toliet, bedpan, or urinal?:  Total Help from another person bathing (including washing, rinsing, drying)?: A Lot Help from another person to put on and taking off regular upper body clothing?: A Lot Help from another person to put on and taking off regular lower body clothing?: Total 6 Click Score: 8    End of Session Equipment Utilized During Treatment: Gait belt;Rolling walker (2 wheels)  OT Visit Diagnosis: Unsteadiness on feet (R26.81);Muscle weakness (generalized) (M62.81);Dizziness and giddiness (R42)   Activity Tolerance Patient limited by fatigue   Patient Left with family/visitor present;in bed;with bed alarm set;with call bell/phone within reach   Nurse Communication Mobility status        Time: 1352-1410 OT Time Calculation (min): 18 min  Charges: OT General Charges $OT Visit: 1 Visit OT Treatments $Therapeutic Activity: 8-22 mins  Lucie Kendall, OTR/L Acute Rehabilitation Services Office [phone] Secure Chat Communication Preferred   Lucie JONETTA Kendall 06/21/2024, 3:17 PM

## 2024-06-21 NOTE — Progress Notes (Signed)
 Subjective: The patient is alert and pleasant.  She has no complaints.  Objective: Vital signs in last 24 hours: Temp:  [97.4 F (36.3 C)-98.3 F (36.8 C)] 97.7 F (36.5 C) (06/23 0342) Pulse Rate:  [61-72] 61 (06/23 0342) Resp:  [16-18] 18 (06/23 0342) BP: (104-141)/(62-80) 116/80 (06/23 0342) SpO2:  [93 %-99 %] 99 % (06/23 0342) Estimated body mass index is 28.35 kg/m as calculated from the following:   Height as of this encounter: 5' 2 (1.575 m).   Weight as of this encounter: 70.3 kg.   Intake/Output from previous day: 06/22 0701 - 06/23 0700 In: 295 [P.O.:295] Out: 450 [Urine:450] Intake/Output this shift: No intake/output data recorded.  Physical exam the patient is alert and oriented.  She is moving all 4 extremities well.  Her speech is normal.  Her craniotomy vision is healing well.  The pathology is pending.  The cultures are negative.  Lab Results: Recent Labs    06/21/24 0446  WBC 12.7*  HGB 12.5  HCT 38.5  PLT 261   BMET Recent Labs    06/21/24 0446  NA 140  K 4.7  CL 107  CO2 25  GLUCOSE 120*  BUN 27*  CREATININE 1.03*  CALCIUM  9.1    Studies/Results: No results found.  Assessment/Plan: Abnormal brain MRI: We are awaiting pathology.  It is okay to start Lovenox from my point of view if clinically indicated.  LOS: 15 days     Gabriela Moyer 06/21/2024, 7:55 AM     Patient ID: Gabriela Moyer, female   DOB: 1941/07/03, 83 y.o.   MRN: [id number]

## 2024-06-21 NOTE — Progress Notes (Signed)
 PROGRESS NOTE  Gabriela Moyer FMW:[id number] DOB: [birth year] DOA: 06/06/2024 PCP: [name], [name], MD   LOS: 15 days   Brief Narrative / Interim history: Gabriela Moyer is a 83 y.o. female with medical history significant of chronic migraine, essential hypertension, hyperlipidemia, extensive  PAD of subclavian, axillary, brachial and renal artery status post right renal artery stent 2010, hypothyroidism and paroxysmal afibrillation not on Xarelto  due to history of GI bleed recent emergency department complaining of headache for 4 days which is right-sided with associated vertigo, some vision change with the onset of the headache. EEG with focal seizures.  MRI with leptomeningeal/pial-arachnoid enhancement in posterior L frontal lobe and high left parietal lobe.  Workup ongoing with assistance of neurology.  Underwent LP, status post open biopsy by neurosurgery on 6/19.  She was monitored in the ICU 6/20 and back to the hospitalist service 6/21  Subjective / 24h Interval events: More alert, calm today.  Remains confused  Assesement and Plan: Principal Problem:   Ischemic cerebrovascular accident (CVA) (HCC) Active Problems:   Headache, right-sided blurry vision and dizziness   Migraine headache   AKI (acute kidney injury) (HCC)   Essential hypertension   Hyperlipidemia   History of migraine   PVD (peripheral vascular disease) (HCC)   History of stent insertion of renal artery   History of paroxysmal atrial tachycardia   History of GI bleed   Principal problem Acute CVA, Leptomeinigeal/pial-arachnoid enhancement in the posterior L frontal lobe and high left parietal lobe, Focal seizures  -patient initially presented to the hospital with migraines, headache, right visual field cut.  She underwent an MRI of the brain which showed leptomeningeal/pial-arachnoid enhancement in the posterior L frontal lobe and high left parietal lobe corresponding to an area of abnormality on CT - additional  region of infarct in the R occipital lobe with associated leptomeningeal/pial arachnoid enhancement.  Differential is broad including CNS lymphoma, vasculitis, leptomeningeal carcinomatosis, TB.  Recent EEG without definitive seizures, now discontinued currently on Keppra  1500 mg BID and Depakote  750 mg Q12 - LP fairly unremarkable, AFB smear negative, glucose was normal, 1 WBC, 2 RBC, negative crypto antigen, negative meningitis/encephalitis panel.  Negative quant gold, negative RF, negative aldolase, ANCA negative, CRP, C3/C4 wnl, Scl-70 negative, anti CCP wnl, sed rate wnl, ENA negative, anti-jo 1 wnl.  Cytology (no malignant cells).  - Neurosurgery also following, status post biopsy 6/19.  Biopsy results pending this morning  Active problems Goals of Care -DNR   Acute Metabolic Encephalopathy -Mental status is fluctuating. MRI showed interval evolution of early subacute R PCA distribution infarct.  No significant interval vhange in appearance of leptomeningeal enhancement within the high posterior L frontoparietal region.  Subtly increased patchy flair signal abnormality with few foci of enhancement involving the L occipital lobe.  Suspect related to above process - unclear what is causing rapid worsening. Delirium precautions -  EEG 6/16 with periodic discharges with triphasic morphology - moderate diffuse encephalopathy - no seizures noted.  Repeat EEG postoperatively 6/20 showed moderate to severe diffuse encephalopathy without seizures or epileptiform discharges noted - Continue supportive care,  Possible UTI -unclear symptoms given confusion, but favor treating.  Microbiology showed Klebsiella, she is now status post completed treatment  CKD 3A-baseline creatinine 0.9-1.1.  At baseline  Hypokalemia -potassium stable.  Monitor intermittently  Hypothyroidism - TSH high at 17, suggesting synthroid  underdosed.  Synthroid  increased to 50 mcg, repeat follow-up should be done in 4 weeks   Acute  migraine headache with  associated nausea - Continue Fioricet  as needed. Tigan  for nausea.    Paroxysmal Afib - RVR resolved. Decreased home dose Toprol  to xl 25 daily. Not on anticoagulation due to gi bleed. Continue telemetry monitoring.   QT prolongation -Avoid QT prolonging drugs. Continue Tigan  as needed for nausea.   Hypotension - Orthostatic vitals positive. Low dose Toprol  xl 25 daily as she has Afib. Monitor vitals closely.  Scheduled Meds:  atorvastatin   40 mg Oral Daily   vitamin B-12  1,000 mcg Oral Daily   divalproex   750 mg Oral Q12H   famotidine   40 mg Oral Daily   levETIRAcetam   1,500 mg Intravenous Q12H   Or   levETIRAcetam   1,500 mg Oral Q12H   levothyroxine   50 mcg Oral Daily   metoprolol  succinate  25 mg Oral Daily   potassium chloride   20 mEq Oral Daily   Continuous Infusions:  sodium chloride  Stopped (06/07/24 1418)   valproate sodium  750 mg (06/20/24 2048)   PRN Meds:.acetaminophen  **OR** acetaminophen  (TYLENOL ) oral liquid 160 mg/5 mL **OR** acetaminophen , diphenhydrAMINE , labetalol , ondansetron  **OR** ondansetron  (ZOFRAN ) IV, promethazine , senna-docusate  Current Outpatient Medications  Medication Instructions   amLODipine  (NORVASC ) 5 mg, Oral, Daily   clopidogrel  (PLAVIX ) 75 mg, Oral, Daily   ezetimibe  (ZETIA ) 10 MG tablet TAKE 1 TABLET(10 MG) BY MOUTH DAILY   hydrochlorothiazide  (HYDRODIURIL ) 25 MG tablet TAKE 1 TABLET(25 MG) BY MOUTH DAILY   levothyroxine  (SYNTHROID , LEVOTHROID) 25 MCG tablet 1 tablet, Daily   metoprolol  succinate (TOPROL -XL) 100 MG 24 hr tablet TAKE 1 TABLET(100 MG) BY MOUTH DAILY   metoprolol  tartrate (LOPRESSOR ) 50 MG tablet Take 2 hours prior to procedure   pravastatin  (PRAVACHOL ) 40 mg, Oral, Every evening   rosuvastatin (CRESTOR) 5 mg, Oral, Daily PRN    Diet Orders (From admission, onward)     Start     Ordered   06/19/24 0902  DIET DYS 3 Room service appropriate? Yes with Assist; Fluid consistency: Thin  Diet effective now        Comments: Meds whole in puree  Question Answer Comment  Room service appropriate? Yes with Assist   Fluid consistency: Thin      06/19/24 0902            DVT prophylaxis: SCDs Start: 06/17/24 1417 Place and maintain sequential compression device Start: 06/07/24 1849   Lab Results  Component Value Date   PLT 261 06/21/2024      Code Status: Limited: Do not attempt resuscitation (DNR) -DNR-LIMITED -Do Not Intubate/DNI   Family Communication: No family at bedside  Status is: Inpatient Remains inpatient appropriate because: severity of illness  Level of care: Progressive  Consultants:  Neurology Neurosurgery   Objective: Vitals:   06/20/24 1931 06/20/24 2320 06/21/24 0342 06/21/24 0801  BP: 104/62 (!) 141/72 116/80 122/89  Pulse: 72 62 61 (!) 50  Resp: 16  18 15   Temp: 98.3 F (36.8 C) 98 F (36.7 C) 97.7 F (36.5 C) 98.1 F (36.7 C)  TempSrc: Oral Oral Oral Oral  SpO2: 93% 97% 99% 99%  Weight:      Height:        Intake/Output Summary (Last 24 hours) at 06/21/2024 0926 Last data filed at 06/20/2024 1850 Gross per 24 hour  Intake 295 ml  Output 450 ml  Net -155 ml    Wt Readings from Last 3 Encounters:  06/17/24 70.3 kg  07/02/23 74 kg  07/03/22 77.6 kg    Examination:  Constitutional: NAD Eyes: lids  and conjunctivae normal, no scleral icterus ENMT: mmm Neck: normal, supple Respiratory: clear to auscultation bilaterally, no wheezing, no crackles. Normal respiratory effort.  Cardiovascular: Regular rate and rhythm, no murmurs / rubs / gallops. No LE edema. Abdomen: soft, no distention, no tenderness. Bowel sounds positive.     Data Reviewed: I have independently reviewed following labs and imaging studies   CBC Recent Labs  Lab 06/15/24 0438 06/16/24 0457 06/17/24 0442 06/18/24 0505 06/21/24 0446  WBC 7.9 8.1 9.9 10.3 12.7*  HGB 13.7 14.4 13.9 13.2 12.5  HCT 41.1 43.5 41.7 40.2 38.5  PLT 195 170 178 158 261  MCV 91.5 92.6 91.2  93.9 93.2  MCH 30.5 30.6 30.4 30.8 30.3  MCHC 33.3 33.1 33.3 32.8 32.5  RDW 12.0 12.0 12.2 12.3 12.2  LYMPHSABS 1.1 1.1  --   --   --   MONOABS 0.9 1.1*  --   --   --   EOSABS 0.1 0.1  --   --   --   BASOSABS 0.0 0.0  --   --   --     Recent Labs  Lab 06/15/24 0438 06/16/24 0457 06/17/24 0442 06/18/24 0505 06/21/24 0446  NA 138 137 138 139 140  K 3.8 3.9 4.1 4.3 4.7  CL 105 105 106 105 107  CO2 25 23 25 26 25   GLUCOSE 90 89 128* 143* 120*  BUN 25* 16 13 14  27*  CREATININE 1.19* 1.09* 1.00 0.88 1.03*  CALCIUM  9.0 8.9 9.2 9.3 9.1  AST 49* 52* 61* 45* 33  ALT 35 35 39 31 21  ALKPHOS 146* 164* 184* 149* 141*  BILITOT 0.6 0.6 0.5 0.2 0.3  ALBUMIN 2.7* 2.7* 2.6* 2.4* 2.2*  MG 2.3 2.1 1.9  --  1.8    ------------------------------------------------------------------------------------------------------------------ No results for input(s): CHOL, HDL, LDLCALC, TRIG, CHOLHDL, LDLDIRECT in the last 72 hours.  Lab Results  Component Value Date   HGBA1C 5.5 06/07/2024   ------------------------------------------------------------------------------------------------------------------ No results for input(s): TSH, T4TOTAL, T3FREE, THYROIDAB in the last 72 hours.  Invalid input(s): FREET3  Cardiac Enzymes No results for input(s): CKMB, TROPONINI, MYOGLOBIN in the last 168 hours.  Invalid input(s): CK ------------------------------------------------------------------------------------------------------------------ No results found for: BNP  CBG: No results for input(s): GLUCAP in the last 168 hours.   Recent Results (from the past 240 hours)  Acid Fast Smear (AFB)     Status: None   Collection Time: 06/12/24  9:36 AM   Specimen: CSF  Result Value Ref Range Status   AFB Specimen Processing Concentration  Final   Acid Fast Smear Negative  Final    Comment: (NOTE) Performed At: [facility] [address] [place], [place]  [id number] Jennette Shorter MD Ey:[id number]    Source (AFB) CSF  Final    Comment: Performed at [facility], [address], [place], [place] [address]  CSF culture w Gram Stain     Status: None   Collection Time: 06/12/24  1:10 PM   Specimen: CSF; Cerebrospinal Fluid  Result Value Ref Range Status   Specimen Description CSF  Final   Special Requests NONE  Final   Gram Stain WBC SEEN NO ORGANISMS SEEN CYTOSPIN SMEAR   Final   Culture   Final    NO GROWTH 3 DAYS Performed at [facility], [address] [place], [place] [address]    Report Status 06/15/2024 FINAL  Final  MT-RIF NAA W Cult, Non-Sputum     Status: None (  Preliminary result)   Collection Time: 06/12/24  1:10 PM   Specimen: CSF; Cerebrospinal Fluid  Result Value Ref Range Status   Source CSF  Final    Comment: Performed at [facility], [address] [place], [place] [address]   Specimen Source Comment  Final    Comment: Cerebrospinal fluid (CSF)   M tuberculosis complex NOT DETECTED NOT DETECTED Final    Comment: (NOTE) This test was developed and its performance characteristics determined by [facility]. It has not been cleared or approved by the Food and Drug Administration.    Rifampin Not applicable NOT DETECTED Final    Comment: (NOTE) This test was developed and its performance characteristics determined by [facility]. It has not been cleared or approved by the Food and Drug Administration.    AFB Specimen Processing Concentration  Final    Comment: (NOTE) Performed At: [facility] [address] [place], [place] [id number] Jennette Shorter MD Ey:[id number]    Acid Fast Culture PENDING  Incomplete  Urine Culture (for pregnant, neutropenic or urologic patients or patients with an indwelling urinary catheter)     Status: Abnormal   Collection Time: 06/13/24  5:35 PM   Specimen: Urine, Clean Catch  Result Value Ref Range Status   Specimen Description URINE, CLEAN CATCH  Final    Special Requests   Final    NONE Performed at [facility], [address], [place], [place] [address]    Culture >=100,000 COLONIES/mL KLEBSIELLA PNEUMONIAE (A)  Final   Report Status 06/15/2024 FINAL  Final   Organism ID, Bacteria KLEBSIELLA PNEUMONIAE (A)  Final      Susceptibility   Klebsiella pneumoniae - MIC*    AMPICILLIN >=32 RESISTANT Resistant     CEFAZOLIN  <=4 SENSITIVE Sensitive     CEFEPIME <=0.12 SENSITIVE Sensitive     CEFTRIAXONE  <=0.25 SENSITIVE Sensitive     CIPROFLOXACIN <=0.25 SENSITIVE Sensitive     GENTAMICIN <=1 SENSITIVE Sensitive     IMIPENEM 1 SENSITIVE Sensitive     NITROFURANTOIN 64 INTERMEDIATE Intermediate     TRIMETH/SULFA <=20 SENSITIVE Sensitive     AMPICILLIN/SULBACTAM 16 INTERMEDIATE Intermediate     PIP/TAZO 8 SENSITIVE Sensitive ug/mL    * >=100,000 COLONIES/mL KLEBSIELLA PNEUMONIAE  Aerobic/Anaerobic Culture w Gram Stain (surgical/deep wound)     Status: None (Preliminary result)   Collection Time: 06/17/24 12:33 PM   Specimen: PATH Brain biopsy; Tissue  Result Value Ref Range Status   Specimen Description BIOPSY  Final   Special Requests LEFT BRAIN  Final   Gram Stain NO WBC SEEN NO ORGANISMS SEEN   Final   Culture   Final    NO GROWTH 4 DAYS NO ANAEROBES ISOLATED; CULTURE IN PROGRESS FOR 5 DAYS Performed at [facility], [address], [place], [place] [address]    Report Status PENDING  Incomplete     Radiology Studies: No results found.   Nilda Fendt, MD, PhD [facility]  Between 7 am - 7 pm I am available, please contact me via Amion (for emergencies) or Securechat (non urgent messages)  Between 7 pm - 7 am I am not available, please contact night coverage MD/APP via Amion

## 2024-06-21 NOTE — Progress Notes (Signed)
 Inpatient Rehab Admissions Coordinator:    CIR following. Path pending from brain, not ready for admission at this time but will follow for readiness.  Leita Kleine, MS, CCC-SLP Rehab Admissions Coordinator  [phone] (celll) [phone] (office)

## 2024-06-21 NOTE — Progress Notes (Signed)
 Physical Therapy Treatment Patient Details Name: Gabriela Moyer MRN: [id number] DOB: [birth year] Today's Date: 06/21/2024   History of Present Illness Pt is an 83 y/o F admitted on 06/06/24 after presenting with c/o HA x 4 days, as well as vertigo & vision changes. MRI brain w + w /o C which demonstrated leptomeningeal/pial-arachnoid enhancement in the posterior left frontal lobe and high left parietal lobe corresponding to area of abnormality on CT. 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 6/14 neuro status change & LP negative for acute findings. 6/19 bur hole for brain biopsy, results pending. PMH: chronic migraine, HTN, HLD, extensive PAD of subclavian, axillary, brachial & renal artery s/p R renal artery stent 2010, hypothyroidism, paroxysmal a-fib.    PT Comments  Pt received in supine and continues to demonstrate low arousal and participation in session. Pt requires max-total A +2 for bed mobility due to lack of initiation. Pt requires max A for sitting balance, but is able to progress to brief periods of CGA with cues and improved righting reactions. Pt able to tolerate 2 stands at EOB with mod A +2, but is unable to progress lateral stepping. Pt initiates return to supine once sitting back on EOB. Pt able to tolerate BLE exercise in supine with fair strength, but demonstrates difficulty following commands for technique or additional exercises. Intermittent assist for initiation of exercise required. Pt continues to benefit from PT services to progress toward functional mobility goals.    If plan is discharge home, recommend the following: Assistance with cooking/housework;Assist for transportation;Help with stairs or ramp for entrance;A lot of help with walking and/or transfers;A lot of help with bathing/dressing/bathroom   Can travel by private vehicle        Equipment Recommendations  Rolling walker (2 wheels);BSC/3in1;Wheelchair (measurements PT);Wheelchair  cushion (measurements PT)    Recommendations for Other Services       Precautions / Restrictions Precautions Precautions: Fall Recall of Precautions/Restrictions: Impaired Precaution/Restrictions Comments: left visual field cut, orthostatic Restrictions Weight Bearing Restrictions Per Provider Order: No     Mobility  Bed Mobility Overal bed mobility: Needs Assistance Bed Mobility: Supine to Sit, Sit to Supine     Supine to sit: Total assist, +2 for physical assistance, +2 for safety/equipment Sit to supine: Max assist, +2 for physical assistance, +2 for safety/equipment   General bed mobility comments: total A +2 to sit EOB due to lack of initiation. Pt initiating return to supine by laying on side, but requires max A +2 to complete    Transfers Overall transfer level: Needs assistance Equipment used: 2 person hand held assist Transfers: Sit to/from Stand Sit to Stand: Mod assist, +2 physical assistance, +2 safety/equipment           General transfer comment: mod A +2 for power up. Attempted lateral stepping, but pt unable to advance feet    Ambulation/Gait                   Stairs             Wheelchair Mobility     Tilt Bed    Modified Rankin (Stroke Patients Only) Modified Rankin (Stroke Patients Only) Pre-Morbid Rankin Score: No symptoms Modified Rankin: Severe disability     Balance Overall balance assessment: Needs assistance Sitting-balance support: Feet supported Sitting balance-Leahy Scale: Poor Sitting balance - Comments: CGA-max A for sitting balance EOB. Improved righting reactions with prolonged sitting intermittently with cues Postural control: Posterior lean Standing balance support:  Bilateral upper extremity supported, During functional activity Standing balance-Leahy Scale: Poor Standing balance comment: with 2 HHA                            Communication Communication Communication: Impaired Factors  Affecting Communication: Difficulty expressing self  Cognition Arousal: Lethargic Behavior During Therapy: Flat affect   PT - Cognitive impairments: Orientation, Awareness, Memory, Attention, Initiation, Sequencing, Problem solving, Safety/Judgement                       PT - Cognition Comments: limited verbalizations and initiation. Cues required to open eyes during session Following commands: Impaired Following commands impaired: Follows one step commands inconsistently, Follows one step commands with increased time    Cueing Cueing Techniques: Verbal cues, Gestural cues, Tactile cues  Exercises General Exercises - Lower Extremity Heel Slides: AROM, AAROM, Supine, Both, 10 reps    General Comments General comments (skin integrity, edema, etc.): VSS      Pertinent Vitals/Pain Pain Assessment Pain Assessment: No/denies pain     PT Goals (current goals can now be found in the care plan section) Acute Rehab PT Goals Patient Stated Goal: get better PT Goal Formulation: Patient unable to participate in goal setting Time For Goal Achievement: 06/21/24 Progress towards PT goals: Progressing toward goals    Frequency    Min 3X/week           Co-evaluation PT/OT/SLP Co-Evaluation/Treatment: Yes Reason for Co-Treatment: Complexity of the patient's impairments (multi-system involvement);For patient/therapist safety;To address functional/ADL transfers PT goals addressed during session: Mobility/safety with mobility;Balance;Strengthening/ROM OT goals addressed during session: ADL's and self-care      AM-PAC PT 6 Clicks Mobility   Outcome Measure  Help needed turning from your back to your side while in a flat bed without using bedrails?: A Lot Help needed moving from lying on your back to sitting on the side of a flat bed without using bedrails?: A Lot Help needed moving to and from a bed to a chair (including a wheelchair)?: Total Help needed standing up from a  chair using your arms (e.g., wheelchair or bedside chair)?: Total Help needed to walk in hospital room?: Total Help needed climbing 3-5 steps with a railing? : Total 6 Click Score: 8    End of Session Equipment Utilized During Treatment: Gait belt Activity Tolerance: Patient limited by lethargy Patient left: in bed;with call bell/phone within reach;with bed alarm set Nurse Communication: Mobility status PT Visit Diagnosis: Unsteadiness on feet (R26.81);Difficulty in walking, not elsewhere classified (R26.2)     Time: 8647-8584 PT Time Calculation (min) (ACUTE ONLY): 23 min  Charges:    $Therapeutic Activity: 8-22 mins PT General Charges $$ ACUTE PT VISIT: 1 Visit                     Darryle George, PTA Acute Rehabilitation Services Secure Chat Preferred  [phone]    Darryle George 06/21/2024, 3:58 PM

## 2024-06-22 DIAGNOSIS — I639 Cerebral infarction, unspecified: Secondary | ICD-10-CM | POA: Diagnosis not present

## 2024-06-22 LAB — AEROBIC/ANAEROBIC CULTURE W GRAM STAIN (SURGICAL/DEEP WOUND)
Culture: NO GROWTH
Gram Stain: NONE SEEN

## 2024-06-22 MED ORDER — ENOXAPARIN SODIUM 40 MG/0.4ML IJ SOSY
40.0000 mg | PREFILLED_SYRINGE | INTRAMUSCULAR | Status: DC
  Administered 2024-06-22 – 2024-06-24 (×3): 40 mg via SUBCUTANEOUS
  Filled 2024-06-22 (×3): qty 0.4

## 2024-06-22 MED ORDER — ORAL CARE MOUTH RINSE: 15.0000 mL | OROMUCOSAL | Status: DC | PRN

## 2024-06-22 MED ORDER — ORAL CARE MOUTH RINSE
15.0000 mL | OROMUCOSAL | Status: DC
  Administered 2024-06-22 – 2024-06-27 (×20): 15 mL via OROMUCOSAL

## 2024-06-22 NOTE — Progress Notes (Signed)
 Subjective: The patient is alert.  She is a bit tearful and says I want to go home.  Objective: Vital signs in last 24 hours: Temp:  [97.8 F (36.6 C)-98.1 F (36.7 C)] 97.9 F (36.6 C) (06/24 0457) Pulse Rate:  [50-65] 65 (06/24 0457) Resp:  [15-16] 16 (06/23 2011) BP: (95-148)/(51-96) 148/96 (06/24 0457) SpO2:  [96 %-100 %] 96 % (06/24 0457) Estimated body mass index is 28.35 kg/m as calculated from the following:   Height as of this encounter: 5' 2 (1.575 m).   Weight as of this encounter: 70.3 kg.   Intake/Output from previous day: 06/23 0701 - 06/24 0700 In: 355 [P.O.:355] Out: 1300 [Urine:1300] Intake/Output this shift: No intake/output data recorded.  Physical exam the patient is alert and pleasant.  She is moving all 4 extremities well.  Her bur hole incision is healing well.  Lab Results: Recent Labs    06/21/24 0446  WBC 12.7*  HGB 12.5  HCT 38.5  PLT 261   BMET Recent Labs    06/21/24 0446  NA 140  K 4.7  CL 107  CO2 25  GLUCOSE 120*  BUN 27*  CREATININE 1.03*  CALCIUM  9.1    Studies/Results: No results found.  Assessment/Plan: Postop day #5: We are awaiting the pathology results.  LOS: 16 days     Reyes JONETTA Budge 06/22/2024, 7:33 AM     Patient ID: Rock JAYSON Sayres, female   DOB: [birth year], 83 y.o.   MRN: [id number]

## 2024-06-22 NOTE — Plan of Care (Signed)
  Problem: Nutrition: Goal: Risk of aspiration will decrease Outcome: Progressing Goal: Dietary intake will improve Outcome: Progressing   Problem: Clinical Measurements: Goal: Ability to maintain clinical measurements within normal limits will improve Outcome: Progressing Goal: Will remain free from infection Outcome: Progressing Goal: Diagnostic test results will improve Outcome: Progressing Goal: Respiratory complications will improve Outcome: Progressing Goal: Cardiovascular complication will be avoided Outcome: Progressing   Problem: Pain Managment: Goal: General experience of comfort will improve and/or be controlled Outcome: Progressing   Problem: Safety: Goal: Ability to remain free from injury will improve Outcome: Progressing   Problem: Skin Integrity: Goal: Risk for impaired skin integrity will decrease Outcome: Progressing

## 2024-06-22 NOTE — Progress Notes (Signed)
 PROGRESS NOTE  Gabriela Moyer FMW:[id number] DOB: 09/21/1941 DOA: 06/06/2024 PCP: Vernadine Charlie ORN, MD   LOS: 16 days   Brief Narrative / Interim history: Gabriela Moyer is a 83 y.o. female with medical history significant of chronic migraine, essential hypertension, hyperlipidemia, extensive  PAD of subclavian, axillary, brachial and renal artery status post right renal artery stent 2010, hypothyroidism and paroxysmal afibrillation not on Xarelto  due to history of GI bleed recent emergency department complaining of headache for 4 days which is right-sided with associated vertigo, some vision change with the onset of the headache. EEG with focal seizures.  MRI with leptomeningeal/pial-arachnoid enhancement in posterior L frontal lobe and high left parietal lobe.  Workup ongoing with assistance of neurology.  Underwent LP, status post open biopsy by neurosurgery on 6/19.  She was monitored in the ICU 6/20 and back to the hospitalist service 6/21  Subjective / 24h Interval events: Alert this morning, very emotional.  Wants to go home  Assesement and Plan: Principal Problem:   Ischemic cerebrovascular accident (CVA) (HCC) Active Problems:   Headache, right-sided blurry vision and dizziness   Migraine headache   AKI (acute kidney injury) (HCC)   Essential hypertension   Hyperlipidemia   History of migraine   PVD (peripheral vascular disease) (HCC)   History of stent insertion of renal artery   History of paroxysmal atrial tachycardia   History of GI bleed   Principal problem Acute CVA, Leptomeinigeal/pial-arachnoid enhancement in the posterior L frontal lobe and high left parietal lobe, Focal seizures  -patient initially presented to the hospital with migraines, headache, right visual field cut.  She underwent an MRI of the brain which showed leptomeningeal/pial-arachnoid enhancement in the posterior L frontal lobe and high left parietal lobe corresponding to an area of abnormality on CT -  additional region of infarct in the R occipital lobe with associated leptomeningeal/pial arachnoid enhancement.  Differential is broad including CNS lymphoma, vasculitis, leptomeningeal carcinomatosis, TB.  Recent EEG without definitive seizures, now discontinued currently on Keppra  1500 mg BID and Depakote  750 mg Q12 - LP fairly unremarkable, AFB smear negative, glucose was normal, 1 WBC, 2 RBC, negative crypto antigen, negative meningitis/encephalitis panel.  Negative quant gold, negative RF, negative aldolase, ANCA negative, CRP, C3/C4 wnl, Scl-70 negative, anti CCP wnl, sed rate wnl, ENA negative, anti-jo 1 wnl.  Cytology (no malignant cells).  - Neurosurgery also following, status post brain biopsy 6/19.  Awaiting pathology, results still pending this morning.  Anticipate some pertinent information hopefully later on today  Active problems Goals of Care -DNR   Acute Metabolic Encephalopathy -Mental status is fluctuating. MRI showed interval evolution of early subacute R PCA distribution infarct.  No significant interval vhange in appearance of leptomeningeal enhancement within the high posterior L frontoparietal region.  Subtly increased patchy flair signal abnormality with few foci of enhancement involving the L occipital lobe.  Suspect related to above process - unclear what is causing rapid worsening. Delirium precautions -  EEG 6/16 with periodic discharges with triphasic morphology - moderate diffuse encephalopathy - no seizures noted.  Repeat EEG postoperatively 6/20 showed moderate to severe diffuse encephalopathy without seizures or epileptiform discharges noted - Continue supportive care  Possible UTI -unclear symptoms given confusion, but favor treating.  Microbiology showed Klebsiella, she is now status post completed treatment  CKD 3A-baseline creatinine 0.9-1.1.  She is at baseline, repeat labs tomorrow  Hypokalemia -potassium stable.  Monitor intermittently  Hypothyroidism - TSH  high at 17, suggesting synthroid  underdosed.  Synthroid  increased to 50 mcg, repeat follow-up should be done in 4 weeks   Acute migraine headache with associated nausea - Continue Fioricet  as needed. Tigan  for nausea.    Paroxysmal Afib - RVR resolved. Decreased home dose Toprol  to xl 25 daily. Not on anticoagulation due to gi bleed. Continue telemetry monitoring.   QT prolongation -Avoid QT prolonging drugs. Continue Tigan  as needed for nausea.   Hypotension - Orthostatic vitals positive. Low dose Toprol  xl 25 daily as she has Afib. Monitor vitals closely.  Scheduled Meds:  atorvastatin   40 mg Oral Daily   vitamin B-12  1,000 mcg Oral Daily   divalproex   750 mg Oral Q12H   famotidine   40 mg Oral Daily   levETIRAcetam   1,500 mg Intravenous Q12H   Or   levETIRAcetam   1,500 mg Oral Q12H   levothyroxine   50 mcg Oral Daily   metoprolol  succinate  25 mg Oral Daily   mouth rinse  15 mL Mouth Rinse 4 times per day   potassium chloride   20 mEq Oral Daily   Continuous Infusions:  sodium chloride  Stopped (06/07/24 1418)   valproate sodium  750 mg (06/20/24 2048)   PRN Meds:.acetaminophen  **OR** acetaminophen  (TYLENOL ) oral liquid 160 mg/5 mL **OR** acetaminophen , diphenhydrAMINE , labetalol , ondansetron  **OR** ondansetron  (ZOFRAN ) IV, mouth rinse, promethazine , senna-docusate  Current Outpatient Medications  Medication Instructions   amLODipine  (NORVASC ) 5 mg, Oral, Daily   clopidogrel  (PLAVIX ) 75 mg, Oral, Daily   ezetimibe  (ZETIA ) 10 MG tablet TAKE 1 TABLET(10 MG) BY MOUTH DAILY   hydrochlorothiazide  (HYDRODIURIL ) 25 MG tablet TAKE 1 TABLET(25 MG) BY MOUTH DAILY   levothyroxine  (SYNTHROID , LEVOTHROID) 25 MCG tablet 1 tablet, Daily   metoprolol  succinate (TOPROL -XL) 100 MG 24 hr tablet TAKE 1 TABLET(100 MG) BY MOUTH DAILY   metoprolol  tartrate (LOPRESSOR ) 50 MG tablet Take 2 hours prior to procedure   pravastatin  (PRAVACHOL ) 40 mg, Oral, Every evening   rosuvastatin (CRESTOR) 5 mg, Oral,  Daily PRN    Diet Orders (From admission, onward)     Start     Ordered   06/19/24 0902  DIET DYS 3 Room service appropriate? Yes with Assist; Fluid consistency: Thin  Diet effective now       Comments: Meds whole in puree  Question Answer Comment  Room service appropriate? Yes with Assist   Fluid consistency: Thin      06/19/24 0902            DVT prophylaxis: SCDs Start: 06/17/24 1417 Place and maintain sequential compression device Start: 06/07/24 1849   Lab Results  Component Value Date   PLT 261 06/21/2024      Code Status: Limited: Do not attempt resuscitation (DNR) -DNR-LIMITED -Do Not Intubate/DNI   Family Communication: No family at bedside  Status is: Inpatient Remains inpatient appropriate because: severity of illness  Level of care: Progressive  Consultants:  Neurology Neurosurgery   Objective: Vitals:   06/21/24 2337 06/22/24 0457 06/22/24 0734 06/22/24 1036  BP: 129/83 (!) 148/96 (!) 147/66   Pulse: 65 65 (!) 58 67  Resp:   18   Temp: 97.8 F (36.6 C) 97.9 F (36.6 C) 97.9 F (36.6 C)   TempSrc: Oral Oral Oral   SpO2: 98% 96% 98%   Weight:      Height:        Intake/Output Summary (Last 24 hours) at 06/22/2024 1056 Last data filed at 06/22/2024 0456 Gross per 24 hour  Intake 237 ml  Output 800 ml  Net -  563 ml    Wt Readings from Last 3 Encounters:  06/17/24 70.3 kg  07/02/23 74 kg  07/03/22 77.6 kg    Examination:  Constitutional: NAD Eyes: lids and conjunctivae normal, no scleral icterus ENMT: mmm Neck: normal, supple Respiratory: clear to auscultation bilaterally, no wheezing, no crackles. Normal respiratory effort.  Cardiovascular: Regular rate and rhythm, no murmurs / rubs / gallops. No LE edema. Abdomen: soft, no distention, no tenderness. Bowel sounds positive.     Data Reviewed: I have independently reviewed following labs and imaging studies   CBC Recent Labs  Lab 06/16/24 0457 06/17/24 0442 06/18/24 0505  06/21/24 0446  WBC 8.1 9.9 10.3 12.7*  HGB 14.4 13.9 13.2 12.5  HCT 43.5 41.7 40.2 38.5  PLT 170 178 158 261  MCV 92.6 91.2 93.9 93.2  MCH 30.6 30.4 30.8 30.3  MCHC 33.1 33.3 32.8 32.5  RDW 12.0 12.2 12.3 12.2  LYMPHSABS 1.1  --   --   --   MONOABS 1.1*  --   --   --   EOSABS 0.1  --   --   --   BASOSABS 0.0  --   --   --     Recent Labs  Lab 06/16/24 0457 06/17/24 0442 06/18/24 0505 06/21/24 0446  NA 137 138 139 140  K 3.9 4.1 4.3 4.7  CL 105 106 105 107  CO2 23 25 26 25   GLUCOSE 89 128* 143* 120*  BUN 16 13 14  27*  CREATININE 1.09* 1.00 0.88 1.03*  CALCIUM  8.9 9.2 9.3 9.1  AST 52* 61* 45* 33  ALT 35 39 31 21  ALKPHOS 164* 184* 149* 141*  BILITOT 0.6 0.5 0.2 0.3  ALBUMIN 2.7* 2.6* 2.4* 2.2*  MG 2.1 1.9  --  1.8    ------------------------------------------------------------------------------------------------------------------ No results for input(s): CHOL, HDL, LDLCALC, TRIG, CHOLHDL, LDLDIRECT in the last 72 hours.  Lab Results  Component Value Date   HGBA1C 5.5 06/07/2024   ------------------------------------------------------------------------------------------------------------------ No results for input(s): TSH, T4TOTAL, T3FREE, THYROIDAB in the last 72 hours.  Invalid input(s): FREET3  Cardiac Enzymes No results for input(s): CKMB, TROPONINI, MYOGLOBIN in the last 168 hours.  Invalid input(s): CK ------------------------------------------------------------------------------------------------------------------ No results found for: BNP  CBG: No results for input(s): GLUCAP in the last 168 hours.   Recent Results (from the past 240 hours)  CSF culture w Gram Stain     Status: None   Collection Time: 06/12/24  1:10 PM   Specimen: CSF; Cerebrospinal Fluid  Result Value Ref Range Status   Specimen Description CSF  Final   Special Requests NONE  Final   Gram Stain WBC SEEN NO ORGANISMS SEEN CYTOSPIN SMEAR    Final   Culture   Final    NO GROWTH 3 DAYS Performed at [facility], [address], [place], [place] [address]    Report Status 06/15/2024 FINAL  Final  MT-RIF NAA W Cult, Non-Sputum     Status: None (Preliminary result)   Collection Time: 06/12/24  1:10 PM   Specimen: CSF; Cerebrospinal Fluid  Result Value Ref Range Status   Source CSF  Final    Comment: Performed at [facility], [address] [place], [place] [address]   Specimen Source Comment  Final    Comment: Cerebrospinal fluid (CSF)   M tuberculosis complex NOT DETECTED NOT DETECTED Final    Comment: (NOTE) This test was developed and its performance characteristics determined by [facility]. It has not  been cleared or approved by the Food and Drug Administration.    Rifampin Not applicable NOT DETECTED Final    Comment: (NOTE) This test was developed and its performance characteristics determined by [facility]. It has not been cleared or approved by the Food and Drug Administration.    AFB Specimen Processing Concentration  Final    Comment: (NOTE) Performed At: [facility] [address] [place], [place] [id number] Jennette Shorter MD Ey:[id number]    Acid Fast Culture PENDING  Incomplete  Urine Culture (for pregnant, neutropenic or urologic patients or patients with an indwelling urinary catheter)     Status: Abnormal   Collection Time: 06/13/24  5:35 PM   Specimen: Urine, Clean Catch  Result Value Ref Range Status   Specimen Description URINE, CLEAN CATCH  Final   Special Requests   Final    NONE Performed at [facility], [address] [place], [place] [address]    Culture >=100,000 COLONIES/mL KLEBSIELLA PNEUMONIAE (A)  Final   Report Status 06/15/2024 FINAL  Final   Organism ID, Bacteria KLEBSIELLA PNEUMONIAE (A)  Final      Susceptibility   Klebsiella pneumoniae - MIC*    AMPICILLIN >=32 RESISTANT Resistant     CEFAZOLIN  <=4 SENSITIVE Sensitive     CEFEPIME <=0.12 SENSITIVE  Sensitive     CEFTRIAXONE  <=0.25 SENSITIVE Sensitive     CIPROFLOXACIN <=0.25 SENSITIVE Sensitive     GENTAMICIN <=1 SENSITIVE Sensitive     IMIPENEM 1 SENSITIVE Sensitive     NITROFURANTOIN 64 INTERMEDIATE Intermediate     TRIMETH/SULFA <=20 SENSITIVE Sensitive     AMPICILLIN/SULBACTAM 16 INTERMEDIATE Intermediate     PIP/TAZO 8 SENSITIVE Sensitive ug/mL    * >=100,000 COLONIES/mL KLEBSIELLA PNEUMONIAE  Aerobic/Anaerobic Culture w Gram Stain (surgical/deep wound)     Status: None (Preliminary result)   Collection Time: 06/17/24 12:33 PM   Specimen: PATH Brain biopsy; Tissue  Result Value Ref Range Status   Specimen Description BIOPSY  Final   Special Requests LEFT BRAIN  Final   Gram Stain NO WBC SEEN NO ORGANISMS SEEN   Final   Culture   Final    NO GROWTH 4 DAYS NO ANAEROBES ISOLATED; CULTURE IN PROGRESS FOR 5 DAYS Performed at [facility], [address] [place], [place] [address]    Report Status PENDING  Incomplete     Radiology Studies: No results found.   Nilda Fendt, MD, PhD [facility]  Between 7 am - 7 pm I am available, please contact me via Amion (for emergencies) or Securechat (non urgent messages)  Between 7 pm - 7 am I am not available, please contact night coverage MD/APP via Amion

## 2024-06-22 NOTE — Progress Notes (Signed)
 Inpatient Rehab Admissions Coordinator:    CIR following. Path pending from brain biopsy, not ready yet for rehab.  Leita Kleine, MS, CCC-SLP Rehab Admissions Coordinator  [phone] (celll) [phone] (office)

## 2024-06-23 DIAGNOSIS — I639 Cerebral infarction, unspecified: Secondary | ICD-10-CM | POA: Diagnosis not present

## 2024-06-23 LAB — COMPREHENSIVE METABOLIC PANEL WITH GFR
ALT: 20 U/L (ref 0–44)
AST: 35 U/L (ref 15–41)
Albumin: 2.5 g/dL — ABNORMAL LOW (ref 3.5–5.0)
Alkaline Phosphatase: 132 U/L — ABNORMAL HIGH (ref 38–126)
Anion gap: 11 (ref 5–15)
BUN: 20 mg/dL (ref 8–23)
CO2: 27 mmol/L (ref 22–32)
Calcium: 9.5 mg/dL (ref 8.9–10.3)
Chloride: 100 mmol/L (ref 98–111)
Creatinine, Ser: 0.94 mg/dL (ref 0.44–1.00)
GFR, Estimated: 60 mL/min — ABNORMAL LOW (ref >60)
Glucose, Bld: 95 mg/dL (ref 70–99)
Potassium: 3.9 mmol/L (ref 3.5–5.1)
Sodium: 138 mmol/L (ref 135–145)
Total Bilirubin: 0.4 mg/dL (ref 0.0–1.2)
Total Protein: 5.6 g/dL — ABNORMAL LOW (ref 6.5–8.1)

## 2024-06-23 LAB — CBC
HCT: 43.5 % (ref 36.0–46.0)
Hemoglobin: 14.2 g/dL (ref 12.0–15.0)
MCH: 30 pg (ref 26.0–34.0)
MCHC: 32.6 g/dL (ref 30.0–36.0)
MCV: 91.8 fL (ref 80.0–100.0)
Platelets: 258 10*3/uL (ref 150–400)
RBC: 4.74 MIL/uL (ref 3.87–5.11)
RDW: 12 % (ref 11.5–15.5)
WBC: 10.3 10*3/uL (ref 4.0–10.5)
nRBC: 0 % (ref 0.0–0.2)

## 2024-06-23 LAB — MAGNESIUM: Magnesium: 1.5 mg/dL — ABNORMAL LOW (ref 1.7–2.4)

## 2024-06-23 LAB — PHOSPHORUS: Phosphorus: 2.5 mg/dL (ref 2.5–4.6)

## 2024-06-23 LAB — SURGICAL PATHOLOGY

## 2024-06-23 MED ORDER — MAGNESIUM SULFATE 2 GM/50ML IV SOLN
2.0000 g | Freq: Once | INTRAVENOUS | Status: AC
  Administered 2024-06-23: 2 g via INTRAVENOUS
  Filled 2024-06-23: qty 50

## 2024-06-23 NOTE — Progress Notes (Signed)
 Physical Therapy Treatment Patient Details Name: Gabriela Moyer MRN: [id number] DOB: September 04, 1941 Today's Date: 06/23/2024   History of Present Illness Pt is an 83 y/o F admitted on 06/06/24 after presenting with c/o HA x 4 days, as well as vertigo & vision changes. MRI brain w + w /o C which demonstrated leptomeningeal/pial-arachnoid enhancement in the posterior left frontal lobe and high left parietal lobe corresponding to area of abnormality on CT. 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 6/19 open meningeal biopsy with concern for lymphoma. PMH: chronic migraine, HTN, HLD, extensive PAD of subclavian, axillary, brachial & renal artery s/p R renal artery stent 2010, hypothyroidism, paroxysmal a-fib.   PT Comments  Pt with significant progress towards acute PT goals. Pt was able to perform bed mobility with MinA/ModA. She was then able to stand with RW and MinA for slight boost-up. Pt then ambulated in the room with ModA for RW management as pt had difficulty avoiding obstacles and turning. Pt was fatigued after ambulating and requested to return to supine to rest. Discussed LE exercises in supine with family present and verbalizing understanding. Continue to recommend >3hrs post acute rehab to maximize rehab potential. Acute PT to follow.     If plan is discharge home, recommend the following: Assistance with cooking/housework;Assist for transportation;Help with stairs or ramp for entrance;A lot of help with walking and/or transfers;A lot of help with bathing/dressing/bathroom   Can travel by private vehicle      Yes  Equipment Recommendations  Rolling walker (2 wheels);BSC/3in1;Wheelchair (measurements PT);Wheelchair cushion (measurements PT)       Precautions / Restrictions Precautions Precautions: Fall Recall of Precautions/Restrictions: Impaired Precaution/Restrictions Comments: left visual field cut, orthostatic Restrictions Weight Bearing Restrictions Per  Provider Order: No     Mobility  Bed Mobility Overal bed mobility: Needs Assistance Bed Mobility: Supine to Sit, Sit to Supine    Supine to sit: Min assist, HOB elevated Sit to supine: Mod assist   General bed mobility comments: MinA for trunk raise. ModA to return to supine for LE management    Transfers Overall transfer level: Needs assistance Equipment used: Rolling walker (2 wheels) Transfers: Sit to/from Stand Sit to Stand: Min assist    General transfer comment: MinA for slight boost-up, cues for hand placement    Ambulation/Gait Ambulation/Gait assistance: Mod assist Gait Distance (Feet): 16 Feet Assistive device: Rolling walker (2 wheels) Gait Pattern/deviations: Step-through pattern, Decreased stride length, Trunk flexed Gait velocity: decreased     General Gait Details: ModA for RW management with increased difficulty turning. Cues for RW proximity and direction. Increased fatigue towards end of 16 ft  Modified Rankin (Stroke Patients Only) Modified Rankin (Stroke Patients Only) Pre-Morbid Rankin Score: No symptoms Modified Rankin: Severe disability     Balance Overall balance assessment: Needs assistance Sitting-balance support: Feet supported Sitting balance-Leahy Scale: Good     Standing balance support: Bilateral upper extremity supported, During functional activity, Reliant on assistive device for balance Standing balance-Leahy Scale: Poor Standing balance comment: reliant on RW and external support       Communication Communication Communication: Impaired Factors Affecting Communication: Difficulty expressing self  Cognition Arousal: Alert Behavior During Therapy: Flat affect   PT - Cognitive impairments: Memory, Sequencing, Problem solving, Safety/Judgement  PT - Cognition Comments: Limited verbalizations during session Following commands: Impaired Following commands impaired: Follows one step commands with increased time, Follows  multi-step commands inconsistently    Cueing Cueing Techniques: Verbal cues, Gestural cues, Tactile cues  General Comments General comments (skin integrity, edema, etc.): Discussed SLR, heel slides, and hip ABD/ADD with family. Pt fatigued after ambulating and unable to perform exercises      Pertinent Vitals/Pain Pain Assessment Pain Assessment: No/denies pain     PT Goals (current goals can now be found in the care plan section) Acute Rehab PT Goals PT Goal Formulation: Patient unable to participate in goal setting Time For Goal Achievement: 07/09/24 Potential to Achieve Goals: Fair Progress towards PT goals: Progressing toward goals    Frequency    Min 3X/week       AM-PAC PT 6 Clicks Mobility   Outcome Measure  Help needed turning from your back to your side while in a flat bed without using bedrails?: A Little Help needed moving from lying on your back to sitting on the side of a flat bed without using bedrails?: A Little Help needed moving to and from a bed to a chair (including a wheelchair)?: A Lot Help needed standing up from a chair using your arms (e.g., wheelchair or bedside chair)?: A Little Help needed to walk in hospital room?: A Lot Help needed climbing 3-5 steps with a railing? : Total 6 Click Score: 14    End of Session Equipment Utilized During Treatment: Gait belt Activity Tolerance: Patient limited by lethargy;Patient limited by fatigue Patient left: in bed;with call bell/phone within reach;with bed alarm set;with family/visitor present Nurse Communication: Mobility status PT Visit Diagnosis: Unsteadiness on feet (R26.81);Difficulty in walking, not elsewhere classified (R26.2)     Time: 8597-8581 PT Time Calculation (min) (ACUTE ONLY): 16 min  Charges:    $Gait Training: 8-22 mins PT General Charges $$ ACUTE PT VISIT: 1 Visit                    Kate ORN, PT, DPT Secure Chat Preferred  Rehab Office [phone]   Kate BRAVO  Wendolyn 06/23/2024, 3:11 PM

## 2024-06-23 NOTE — Progress Notes (Signed)
 Removed staples per MD order. Pt tolerated fair. Scant amount of serosanguinous found after removing staples. Applied dry gauze.   Amado GORMAN Arabia, RN

## 2024-06-23 NOTE — Progress Notes (Signed)
  Progress Note   Patient: Gabriela Moyer FMW:[id number] DOB: 1941/05/21 DOA: 06/06/2024     17 DOS: the patient was seen and examined on 06/23/2024 at 12:02PM      Brief hospital course: 83 y.o. F with HTN, PVD (RAS s/p renal artery stent in 2010, subclavian, axillary, coronary), hypothyroidism, pAF not on AC due to hx GIB and HLD who presented with headache, vision change and confusion.  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  Neurology consulted.  EEG showed focal seizures.  Mentation waxing and waning.  Underwent LP which was nondiagnostic, then open meningeal biopsy on 6/19.       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  Discussed with Dr. Buckley, he will see. -Continue Depakote , Keppra , Depacon     Acute Metabolic Encephalopathy  Due to #1 - Standard delirium precautions: blinds open and lights on during day, TV off, minimize interruptions at night, glasses/hearing aids, PT/OT, avoiding Beers list medications      Hypothyroidism - Continue levothyroxine   Atherosclerotic vascular disease - Continue lipitor - Hold Zetia , Plavix   Paroxysmal atrial fibrillation Rate controlled. Not on anticoagulation - Continue metoprolol   CKD 3A Cr stable   Hypertension - Hold amlodipine , HCTZ - Continue metoprolol   Orthostatic hypotension Resolved  Hypomagnesemia - Supplement magnesium       Subjective: Fairly encephalopathic.  No fever.  No respiratory distress.  No nursing concerns.  No vomiting.  Biopsy resulted today, discussed with Dr.  Buckley.     Physical Exam: BP 99/69 (BP Location: Right Arm)   Pulse 77   Temp 97.6 F (36.4 C) (Axillary)   Resp 16   Ht 5' 2 (1.575 m)   Wt 70.3 kg   SpO2 98%   BMI 28.35 kg/m   Elderly adult female, lying in bed, appears weak and tired, severe psychomotor slowing, sluggish RRR, no murmurs, no peripheral edema, no JVD Respiratory normal, lung sounds diminished but no rales or wheezes appreciated Abdomen soft, no grimace to palpation, no guarding Inattentive, psychomotor slowing, generalized weakness, symmetric strength, speech dysarthric    Data Reviewed: Discussed with neurology and neuro-oncology Basic metabolic panel shows hypomagnesemia, otherwise electrolytes and renal function normal CBC normal    Family Communication: Son by phone    Disposition: Status is: Inpatient         Author: [name], MD 06/23/2024 2:01 PM  For on call review [email or web address].

## 2024-06-23 NOTE — Progress Notes (Signed)
 Inpatient Rehab Admissions Coordinator:   CIR following at a distance. Await path results from brain biopsy and treatment plan.  Leita Kleine, MS, CCC-SLP Rehab Admissions Coordinator  [phone] (celll) [phone] (office)

## 2024-06-23 NOTE — Hospital Course (Signed)
 83 y.o. F with HTN, PVD (RAS s/p renal artery stent in 2010, subclavian, axillary, coronary), hypothyroidism, pAF not on AC due to hx GIB and HLD who presented with headache, vision change and confusion.  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  Neurology consulted.  EEG showed focal seizures.  Mentation waxing and waning.  Underwent LP which was nondiagnostic, then open meningeal biopsy on 6/19.

## 2024-06-23 NOTE — Progress Notes (Signed)
 Subjective: The patient is alert and pleasant.  She is in no apparent distress.  She has no complaints.  Objective: Vital signs in last 24 hours: Temp:  [97.4 F (36.3 C)-98.4 F (36.9 C)] 98.4 F (36.9 C) (06/25 0731) Pulse Rate:  [63-73] 73 (06/25 0731) Resp:  [16-19] 18 (06/25 0731) BP: (111-160)/(47-92) 135/69 (06/25 0731) SpO2:  [92 %-100 %] 94 % (06/25 0731) Estimated body mass index is 28.35 kg/m as calculated from the following:   Height as of this encounter: 5' 2 (1.575 m).   Weight as of this encounter: 70.3 kg.   Intake/Output from previous day: 06/24 0701 - 06/25 0700 In: 240 [P.O.:240] Out: 850 [Urine:850] Intake/Output this shift: No intake/output data recorded.  Physical exam the patient is alert and oriented.  She is moving all 4 extremities well.  Lab Results: Recent Labs    06/21/24 0446 06/23/24 0429  WBC 12.7* 10.3  HGB 12.5 14.2  HCT 38.5 43.5  PLT 261 258   BMET Recent Labs    06/21/24 0446 06/23/24 0429  NA 140 138  K 4.7 3.9  CL 107 100  CO2 25 27  GLUCOSE 120* 95  BUN 27* 20  CREATININE 1.03* 0.94  CALCIUM  9.1 9.5    Studies/Results: No results found.  Assessment/Plan: Status post brain biopsy: We are still awaiting the results.  LOS: 17 days     Reyes JONETTA Budge 06/23/2024, 7:54 AM     Patient ID: Rock JAYSON Sayres, female   DOB: 18-Sep-1941, 83 y.o.   MRN: [id number]

## 2024-06-23 NOTE — Plan of Care (Signed)
 Brain biopsy results available.  Concern for lymphoma.  Hematology/oncology is being consulted.  Further management per heme-onc.  Please call back with questions as needed.  Eligio Lav, MD Neurology

## 2024-06-23 NOTE — Plan of Care (Signed)
  Problem: Clinical Measurements: Goal: Ability to maintain clinical measurements within normal limits will improve Outcome: Progressing Goal: Will remain free from infection Outcome: Progressing Goal: Diagnostic test results will improve Outcome: Progressing Goal: Respiratory complications will improve Outcome: Progressing Goal: Cardiovascular complication will be avoided Outcome: Progressing   Problem: Activity: Goal: Risk for activity intolerance will decrease Outcome: Progressing   Problem: Nutrition: Goal: Adequate nutrition will be maintained Outcome: Progressing   Problem: Pain Managment: Goal: General experience of comfort will improve and/or be controlled Outcome: Progressing   Problem: Elimination: Goal: Will not experience complications related to bowel motility Outcome: Progressing Goal: Will not experience complications related to urinary retention Outcome: Progressing   Problem: Safety: Goal: Ability to remain free from injury will improve Outcome: Progressing   Problem: Skin Integrity: Goal: Risk for impaired skin integrity will decrease Outcome: Progressing   Problem: Nutrition: Goal: Risk of aspiration will decrease Outcome: Progressing Goal: Dietary intake will improve Outcome: Progressing

## 2024-06-24 ENCOUNTER — Other Ambulatory Visit: Payer: Self-pay | Admitting: Radiation Therapy

## 2024-06-24 DIAGNOSIS — I639 Cerebral infarction, unspecified: Secondary | ICD-10-CM | POA: Diagnosis not present

## 2024-06-24 DIAGNOSIS — G939 Disorder of brain, unspecified: Secondary | ICD-10-CM | POA: Diagnosis not present

## 2024-06-24 DIAGNOSIS — R4182 Altered mental status, unspecified: Secondary | ICD-10-CM

## 2024-06-24 LAB — CBC
HCT: 44.6 % (ref 36.0–46.0)
Hemoglobin: 14.7 g/dL (ref 12.0–15.0)
MCH: 30 pg (ref 26.0–34.0)
MCHC: 33 g/dL (ref 30.0–36.0)
MCV: 91 fL (ref 80.0–100.0)
Platelets: 241 10*3/uL (ref 150–400)
RBC: 4.9 MIL/uL (ref 3.87–5.11)
RDW: 12 % (ref 11.5–15.5)
WBC: 10.2 10*3/uL (ref 4.0–10.5)
nRBC: 0 % (ref 0.0–0.2)

## 2024-06-24 LAB — COMPREHENSIVE METABOLIC PANEL WITH GFR
ALT: 20 U/L (ref 0–44)
AST: 34 U/L (ref 15–41)
Albumin: 2.6 g/dL — ABNORMAL LOW (ref 3.5–5.0)
Alkaline Phosphatase: 127 U/L — ABNORMAL HIGH (ref 38–126)
Anion gap: 7 (ref 5–15)
BUN: 15 mg/dL (ref 8–23)
CO2: 28 mmol/L (ref 22–32)
Calcium: 9.4 mg/dL (ref 8.9–10.3)
Chloride: 104 mmol/L (ref 98–111)
Creatinine, Ser: 0.96 mg/dL (ref 0.44–1.00)
GFR, Estimated: 58 mL/min — ABNORMAL LOW (ref >60)
Glucose, Bld: 110 mg/dL — ABNORMAL HIGH (ref 70–99)
Potassium: 4.1 mmol/L (ref 3.5–5.1)
Sodium: 139 mmol/L (ref 135–145)
Total Bilirubin: 0.7 mg/dL (ref 0.0–1.2)
Total Protein: 5.7 g/dL — ABNORMAL LOW (ref 6.5–8.1)

## 2024-06-24 MED ORDER — LORAZEPAM 2 MG/ML IJ SOLN
1.0000 mg | INTRAMUSCULAR | Status: AC
  Administered 2024-06-24: 1 mg via INTRAVENOUS

## 2024-06-24 MED ORDER — QUETIAPINE FUMARATE 25 MG PO TABS
25.0000 mg | ORAL_TABLET | Freq: Every day | ORAL | Status: DC
  Administered 2024-06-24 – 2024-06-25 (×2): 25 mg via ORAL
  Filled 2024-06-24 (×3): qty 1

## 2024-06-24 NOTE — Consult Note (Signed)
 [facility] Neuro-Oncology Consult Note  Patient Care Team: [name], Charlie ORN, MD as PCP - General (Internal Medicine) Burnard Debby LABOR, MD as PCP - Cardiology (Cardiology)  CHIEF COMPLAINTS/PURPOSE OF CONSULTATION:  Brain Mass Confusion  HISTORY OF PRESENTING ILLNESS:  Gabriela Moyer 83 y.o. female presented with several days of severe headache, visual impairment and progressive confusion.  After extensive workup, she ended up proceeding with brain biopsy with Dr. Mavis on 06/17/24.  She continues to struggle with cognition, memory, walking.  Not feeding herself independently.  Today she struggles to articulate complaints.  MEDICAL HISTORY:  Past Medical History:  Diagnosis Date   Carotid arterial disease (HCC)    a. 08/2016 Carotid U/S: 1-39% bilat ICA stenosis, >50 R ECA stenosis.   Diastolic dysfunction    a. 11/2016 Echo: EF 65-70%, Gr1 DD.   History of stress test    a. 12/2016 Lexiscan  MV: EF 73%, no ischemia/infarct.   Hyperlipidemia    Hypertension    PAF (paroxysmal atrial fibrillation) (HCC)    a. 11/2016 AF RVR in ED-->converted on IV dilt-->CHA2DS2VASc = 4-->Xarelto .   Renal artery stenosis (HCC)    a. 06/2009 s/p R Renal Artery PTA/stenting; b. 08/2016 Renal Duplex: nl LRA, >60% RRA stenosis- f/u 1 yr.   Subclavian arterial stenosis (HCC)    a. 08/2014 Upper Ext Duplex: R distal Rea & Ax - 70-99% diam reduction, L distal [place] & Ax - 50-69%--stable.    SURGICAL HISTORY: Past Surgical History:  Procedure Laterality Date   APPLICATION OF CRANIAL NAVIGATION N/A 06/17/2024   Procedure: COMPUTER-ASSISTED NAVIGATION, FOR CRANIAL PROCEDURE;  Surgeon: Mavis Purchase, MD;  Location: [facility] OR;  Service: Neurosurgery;  Laterality: N/A;   CARDIAC CATHETERIZATION  11/03/2007   atherosclerotic right renal artery (Dr. DOROTHA Schwalbe)   Carotid Doppler  12/2010   R & L ICA with small amount fibrous plaque; R distal subclav. 70-99% diameter reduction; L mid subclav 50-69% diameter  reduction   Lower Extremity Arterial Doppler  03/2008   right ABI - mild arterial insuff at rest; R CIA with less than 50% diameter reduction, R & L SFA with less than 50% diameter reduction   NM MYOCAR PERF WALL MOTION  09/2009   bruce myoview  - normal perfusion, EF 915, low risk scan   PR DURAL GRAFT SPINAL N/A 06/17/2024   Procedure: FRAMELESS STEREOTACTIC BIOPSY;  Surgeon: Mavis Purchase, MD;  Location: [facility] OR;  Service: Neurosurgery;  Laterality: N/A;  OPEN BRAIN BX WITH STEALTH   RENAL ANGIOPLASTY  11/07/2009   5x38mm Cordis Genesis Aviator stent to ostium of right renal artery (Dr. DOROTHA Lesches)   Renal Artery Doppler  04/2012   celiac artery with >50% diameter reduction; R renal artery stent w/mildly elevated velocities (1-59% diameter reduction)   TRANSTHORACIC ECHOCARDIOGRAM  08/28/2009   EF=>55%; trace TR; mild AV regurg, mild pulm valve regurg   Upper Extremity Arterial Doppler  04/2012   right brachial pressure , left ; R distal subclavian & axillary arteries 70-99% diameter reduction; L distal subclavian/axillary & brachial arteries (50-69% diameter reduction)    SOCIAL HISTORY: Social History   Socioeconomic History   Marital status: Divorced    Spouse name: Not on file   Number of children: 2   Years of education: 12   Highest education level: Not on file  Occupational History   Not on file  Tobacco Use   Smoking status: Never    Passive exposure: Yes   Smokeless tobacco: Never  Vaping Use   Vaping status: Never Used  Substance and Sexual Activity   Alcohol  use: No   Drug use: Never   Sexual activity: Not on file  Other Topics Concern   Not on file  Social History Narrative   Not on file   Social Drivers of Health   Financial Resource Strain: Not on file  Food Insecurity: No Food Insecurity (06/08/2024)   Hunger Vital Sign    Worried About Running Out of Food in the Last Year: Never true    Ran Out of Food in the Last Year: Never true   Transportation Needs: No Transportation Needs (06/08/2024)   PRAPARE - Administrator, Civil Service (Medical): No    Lack of Transportation (Non-Medical): No  Physical Activity: Not on file  Stress: Not on file  Social Connections: Socially Isolated (06/08/2024)   Social Connection and Isolation Panel    Frequency of Communication with Friends and Family: More than three times a week    Frequency of Social Gatherings with Friends and Family: Twice a week    Attends Religious Services: Never    Database administrator or Organizations: No    Attends Banker Meetings: Never    Marital Status: Divorced  Catering manager Violence: Not At Risk (06/08/2024)   Humiliation, Afraid, Rape, and Kick questionnaire    Fear of Current or Ex-Partner: No    Emotionally Abused: No    Physically Abused: No    Sexually Abused: No    FAMILY HISTORY: Family History  Problem Relation Age of Onset   Heart disease Mother    Stroke Mother    Cancer Father    Heart Problems Maternal Grandmother    Heart Problems Maternal Grandfather    Cancer Brother     ALLERGIES:  is allergic to ezetimibe , codeine, contrast media [iodinated contrast media], and morphine.  MEDICATIONS:  Current Facility-Administered Medications  Medication Dose Route Frequency Provider Last Rate Last Admin   acetaminophen  (TYLENOL ) tablet 650 mg  650 mg Oral Q4H PRN Babcock, Peter E, NP   650 mg at 06/20/24 2044   Or   acetaminophen  (TYLENOL ) 160 MG/5ML solution 650 mg  650 mg Per Tube Q4H PRN Babcock, Peter E, NP       Or   acetaminophen  (TYLENOL ) suppository 650 mg  650 mg Rectal Q4H PRN Babcock, Peter E, NP       atorvastatin  (LIPITOR) tablet 40 mg  40 mg Oral Daily Babcock, Peter E, NP   40 mg at 06/24/24 9085   cyanocobalamin  (VITAMIN B12) tablet 1,000 mcg  1,000 mcg Oral Daily Babcock, Peter E, NP   1,000 mcg at 06/24/24 9085   diphenhydrAMINE  (BENADRYL ) capsule 50 mg  50 mg Oral Q6H PRN Babcock,  Peter E, NP       divalproex  (DEPAKOTE ) DR tablet 750 mg  750 mg Oral Q12H Babcock, Peter E, NP   750 mg at 06/24/24 9085   Or   valproate (DEPACON ) 750 mg in dextrose  5 % 50 mL IVPB  750 mg Intravenous Q12H Babcock, Peter E, NP   Stopped at 06/20/24 2149   enoxaparin (LOVENOX) injection 40 mg  40 mg Subcutaneous Q24H Gherghe, Costin M, MD   40 mg at 06/24/24 1532   famotidine  (PEPCID ) tablet 40 mg  40 mg Oral Daily Babcock, Peter E, NP   40 mg at 06/24/24 9085   labetalol  (NORMODYNE ) injection 10-40 mg  10-40 mg Intravenous Q10 min PRN  Jenna Maude BRAVO, NP   10 mg at 06/17/24 1734   levETIRAcetam  (KEPPRA ) undiluted injection 1,500 mg  1,500 mg Intravenous Q12H Babcock, Peter E, NP   1,500 mg at 06/19/24 2035   Or   levETIRAcetam  (KEPPRA ) tablet 1,500 mg  1,500 mg Oral Q12H Babcock, Peter E, NP   1,500 mg at 06/24/24 9094   levothyroxine  (SYNTHROID ) tablet 50 mcg  50 mcg Oral Daily Babcock, Peter E, NP   50 mcg at 06/24/24 9365   metoprolol  succinate (TOPROL -XL) 24 hr tablet 25 mg  25 mg Oral Daily Gherghe, Costin M, MD   25 mg at 06/24/24 9085   ondansetron  (ZOFRAN ) tablet 4 mg  4 mg Oral Q4H PRN Babcock, Peter E, NP       Or   ondansetron  (ZOFRAN ) injection 4 mg  4 mg Intravenous Q4H PRN Babcock, Peter E, NP       Oral care mouth rinse  15 mL Mouth Rinse 4 times per day Trixie Nilda HERO, MD   15 mL at 06/24/24 1628   Oral care mouth rinse  15 mL Mouth Rinse PRN Trixie Nilda HERO, MD       potassium chloride  SA (KLOR-CON  M) CR tablet 20 mEq  20 mEq Oral Daily Jenna Maude BRAVO, NP   20 mEq at 06/24/24 9085   promethazine  (PHENERGAN ) tablet 12.5-25 mg  12.5-25 mg Oral Q4H PRN Babcock, Peter E, NP       QUEtiapine (SEROQUEL) tablet 25 mg  25 mg Oral QHS Danford, Lonni SQUIBB, MD       senna-docusate (Senokot-S) tablet 1 tablet  1 tablet Oral QHS PRN Babcock, Peter E, NP   1 tablet at 06/22/24 9372   sodium chloride  0.9 % bolus 500 mL  500 mL Intravenous Once Jenna Maude BRAVO, NP   Held at  06/07/24 1418    REVIEW OF SYSTEMS:   Limited by AMS   PHYSICAL EXAMINATION: Vitals:   06/24/24 1105 06/24/24 1559  BP: 100/70 112/60  Pulse: 71 77  Resp: 16 16  Temp: 98 F (36.7 C) 98.3 F (36.8 C)  SpO2: 96% 95%   KPS: 50. General: Ill appearing Head: Biopsy scar EENT: No conjunctival injection or scleral icterus. Oral mucosa moist Lungs: Resp effort normal Cardiac: Regular rate and rhythm Abdomen: Soft, non-distended abdomen Skin: No rashes cyanosis or petechiae. Extremities: No clubbing or edema  NEUROLOGIC EXAM: Mental Status: Awake, alert, attentive to examiner. Oriented to self and environment. Language is fluent with intact comprehension to simple commands.  Advanced psychomotor slowing and impaired recall, insight. Cranial Nerves: Visual acuity is grossly normal. Visual fields are full. Extra-ocular movements intact. No ptosis. Face is symmetric, tongue midline. Motor: Tone and bulk are normal. Power is antigravity bilaterally Sensory: Intact to light touch and temperature Gait: Deferred   LABORATORY DATA:  I have reviewed the data as listed Lab Results  Component Value Date   WBC 10.2 06/24/2024   HGB 14.7 06/24/2024   HCT 44.6 06/24/2024   MCV 91.0 06/24/2024   PLT 241 06/24/2024   Recent Labs    06/21/24 0446 06/23/24 0429 06/24/24 0452  NA 140 138 139  K 4.7 3.9 4.1  CL 107 100 104  CO2 25 27 28   GLUCOSE 120* 95 110*  BUN 27* 20 15  CREATININE 1.03* 0.94 0.96  CALCIUM  9.1 9.5 9.4  GFRNONAA 54* 60* 58*  PROT 5.1* 5.6* 5.7*  ALBUMIN 2.2* 2.5* 2.6*  AST 33 35 34  ALT  21 20 20   ALKPHOS 141* 132* 127*  BILITOT 0.3 0.4 0.7    RADIOGRAPHIC STUDIES: I have personally reviewed the radiological images as listed and agreed with the findings in the report. EEG adult Result Date: 06/18/2024 Shelton Arlin KIDD, MD     06/18/2024  9:22 AM Patient Name: Gabriela Moyer MRN: [id number] Epilepsy Attending: Arlin KIDD Shelton Referring Physician/Provider:  Khaliqdina, Salman, MD Date: [id number] Duration: 25.20mins  Patient history: 83 y.o. female with visual disturbance. EEG to evaluate for seizure.  Level of alertness: comatose/ lethargic  AEDs during EEG study: LEV, VPA  Technical aspects: This EEG study was done with scalp electrodes positioned according to the 10-20 International system of electrode placement. Electrical activity was reviewed with band pass filter of 1-70Hz , sensitivity of 7 uV/mm, display speed of 63mm/sec with a 60Hz  notched filter applied as appropriate. EEG data were recorded continuously and digitally stored.  Video monitoring was available and reviewed as appropriate. C3 channel was off during recording du to technical issues  Description:  EEG showed continuous generalized 3 to 5 Hz theta-delta slowing admixed with intermittent 13-15hz  beta activity. Hyperventilation and photic stimulation were not performed.    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 No seizures or epileptiform discharges were noted.  Priyanka KIDD Shelton   Overnight EEG with video Result Date: 06/14/2024 Shelton Arlin KIDD, MD     06/21/2024  8:21 AM Patient Name: Gabriela Moyer MRN: [id number] Epilepsy Attending: Arlin KIDD Shelton Referring Physician/Provider: Shelton Arlin KIDD, MD Duration: 06/13/2024 1634 to 06/14/2024 1047  Patient history: 83 y.o. female with visual disturbance. EEG to evaluate for seizure.  Level of alertness: awake/lethargic, asleep  AEDs during EEG study: LEV, VPA  Technical aspects: This EEG study was done with scalp electrodes positioned according to the 10-20 International system of electrode placement. Electrical activity was reviewed with band pass filter of 1-70Hz , sensitivity of 7 uV/mm, display speed of 53mm/sec with a 60Hz  notched filter applied as appropriate. EEG data were recorded continuously and digitally stored.  Video monitoring was available and  reviewed as appropriate.  Description:  No clear posterior dominant rhythm was seen.  Sleep was characterized by vertex waves, sleep spindles (12 to 14 Hz), maximal frontocentral region. EEG showed continuous generalized 3 to 5 Hz theta-delta slowing. Generalized periodic discharges with triphasic morphology were noted at 1-2hz , prominently seen when awake/stimulation.  Hyperventilation and photic stimulation were not performed.    ABNORMALITY  - Periodic discharges with triphasic morphology, generalized - Continuous slow,  generalized  IMPRESSION: This study showed generalized periodic discharges with triphasic morphology, most likely secondary to toxic-metabolic causes. Additionally there was moderate diffuse encephalopathy. No seizures were noted.  Arlin KIDD Shelton   EEG adult Result Date: 06/13/2024 Shelton Arlin KIDD, MD     06/14/2024  9:56 AM Patient Name: Gabriela Moyer MRN: [id number] Epilepsy Attending: Arlin KIDD Shelton Referring Physician/Provider: Perri DELENA Meliton Mickey., MD Date: 06/13/2024 Duration: 22.22 mins  Patient history: 83 y.o. female with visual disturbance. EEG to evaluate for seizure.  Level of alertness: comatose/ lethargic  AEDs during EEG study: LEV, VPA  Technical aspects: This EEG study was done with scalp electrodes positioned according to the 10-20 International system of electrode placement. Electrical activity was reviewed with band pass filter of 1-70Hz , sensitivity of 7 uV/mm, display speed of 28mm/sec with a 60Hz  notched filter applied as appropriate. EEG data were recorded continuously and digitally  stored.  Video monitoring was available and reviewed as appropriate.  Description:  EEG showed continuous generalized 3 to 5 Hz theta-delta slowing. Generalized periodic discharges with triphasic morphology were noted at 1-2hz .  Hyperventilation and photic stimulation were not performed.    ABNORMALITY  - Periodic discharges with triphasic morphology, generalized - Continuous slow,   generalized  IMPRESSION: This study showed generalized periodic discharges with triphasic morphology which could be secondary to toxic-metabolic causes. However, prior EEG did show seizures and the frequency reaches upto 2hz  which can be on the ictal-interictal continuum. Therefore, recommend long term eeg for further characterization. Additionally there was moderate diffuse encephalopathy. No definite seizures were noted.  Arlin MALVA Krebs   MR BRAIN W WO CONTRAST Result Date: 06/13/2024 CLINICAL DATA:  Follow-up examination four meningeal abnormality. EXAM: MRI HEAD WITHOUT AND WITH CONTRAST TECHNIQUE: Multiplanar, multiecho pulse sequences of the brain and surrounding structures were obtained without and with intravenous contrast. 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 No significant associated hemorrhage. Associated patchy post-contrast enhancement again seen within this region, now more parenchymal in appearance as compared to prior, likely related to an evolving infarct. No other new or interval areas of infarction. Additionally, there is patchy FLAIR signal intensity within the left occipital lobe, increased from prior (series 6, image 17). No acute 5 heights suspected subtle patchy post-contrast enhancement (series 10, image 44). Findings are nonspecific. Area of leptomeningeal enhancement within the high posterior left frontoparietal region again seen, not significantly changed as compared to previous. No other new areas of abnormal enhancement. No other mass lesion, mass effect, or midline shift. No hydrocephalus or extra-axial fluid collection. Pituitary gland suprasellar region within normal limits. No other abnormal enhancement. Vascular: Major  intracranial vascular flow voids are maintained. Skull and upper cervical spine: Craniocervical junction within normal limits. Bone marrow signal intensity normal. No significant scalp soft tissue abnormality. Sinuses/Orbits: Globes and orbital soft tissues within normal limits. Paranasal sinuses are largely clear. No mastoid effusion. Other: None. IMPRESSION: 1. 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 No associated hemorrhage or significant regional mass effect. 2. No significant interval change in appearance of leptomeningeal enhancement within the high posterior left frontoparietal region. Again, this finding is nonspecific, with primary differential considerations including changes of meningitis, leptomeningeal carcinomatosis, sarcoid, or possibly lymphoma. 3. Subtly increased patchy FLAIR signal abnormality with a few foci of enhancement involving the left occipital lobe, new as compared to recent MRI from 06/06/2024. Findings are nonspecific, but could be related to recent seizure given provided history. Superimposed changes of PRES would be the primary differential consideration. Electronically Signed   By: Morene Hoard M.D.   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 RADIATION DOSE REDUCTION: This exam was performed according to the departmental dose-optimization program which includes automated exposure control, adjustment of the mA and/or kV according to patient size and/or use of iterative reconstruction technique. CONTRAST:  75mL OMNIPAQUE  IOHEXOL  350 MG/ML SOLN COMPARISON:  None Available.  FINDINGS: CT CHEST FINDINGS Cardiovascular: Atherosclerotic calcifications of the thoracic aorta are noted. No aneurysmal dilatation or dissection is seen. No cardiac enlargement is noted. Mild coronary calcifications are seen. The pulmonary artery as visualized shows no filling defect to suggest pulmonary embolism. Timing was not performed for embolus evaluation however. Mediastinum/Nodes: Thoracic inlet is within normal limits. No hilar or mediastinal adenopathy is noted. The esophagus as visualized is within normal limits. Lungs/Pleura: Lungs are well aerated bilaterally. A 1-2 mm right upper lobe nodule is noted laterally on image number 76 of series 6. No follow-up is recommended. No other nodules are seen. Minimal basilar atelectasis is seen. Musculoskeletal: No acute rib abnormality is noted. Degenerative changes of the thoracic spine are seen. CT ABDOMEN PELVIS FINDINGS Hepatobiliary: Gallbladder has been surgically removed. Mild biliary ductal dilatation is noted due to the post cholecystectomy state. Small cyst is noted within the left lobe of liver. Pancreas: Unremarkable. No pancreatic ductal dilatation or surrounding inflammatory changes. Spleen: Normal in size without focal abnormality. Adrenals/Urinary Tract: Adrenal glands are within normal limits. Kidneys demonstrate a normal enhancement pattern bilaterally. Multiple small simple appearing cysts are noted. No follow-up is recommended. A stone is noted in the upper pole of the left kidney measuring up to 10 mm with associated scarring identified. No ureteral stones are seen. The bladder is within normal limits. Stomach/Bowel: Scattered diverticular change of the colon is noted without evidence of diverticulitis. No obstructive or inflammatory changes of the colon are seen. The appendix is not well visualized. No inflammatory changes to suggest appendicitis are noted. Small bowel and stomach are unremarkable. Vascular/Lymphatic: Aortic  atherosclerosis. No enlarged abdominal or pelvic lymph nodes. Reproductive: Status post hysterectomy. No adnexal masses. Other: Minimal free fluid is noted within the pelvis of uncertain significance. Musculoskeletal: No acute or significant osseous findings. IMPRESSION: CT of the chest: Mild bibasilar atelectasis. Less than 6 mm right solid pulmonary nodule within the upper lobe. Per Fleischner Society Guidelines, if patient is low risk for malignancy, no routine follow-up imaging is recommended. If patient is high risk for malignancy, a non-contrast Chest CT at 12 months is optional. If performed and the nodule is stable at 12 months, no further follow-up is recommended. These guidelines do not apply to immunocompromised patients and patients with cancer. Follow up in patients with significant comorbidities as clinically warranted. For lung cancer screening, adhere to Lung-RADS guidelines. Reference: Radiology. 2017; 284(1):228-43. CT of the abdomen and pelvis: Diverticulosis without diverticulitis. Left renal stone in the upper pole measuring 10 mm with associated scarring. No other focal abnormality is noted. Electronically Signed   By: Oneil Devonshire M.D.   On: 06/09/2024 23:16   DG FL GUIDED LUMBAR PUNCTURE Result Date: 06/08/2024 CLINICAL DATA:  Provided history: Seizures. Request received for fluoroscopically-guided lumbar puncture. EXAM: LUMBAR PUNCTURE UNDER FLUOROSCOPY PROCEDURE: The patient was positioned prone on the fluoroscopy table. An appropriate skin entry site was determined under fluoroscopy and marked. The operator donned sterile gloves and a mask. The lower back was prepped and draped in the usual sterile fashion. Local anesthesia was provided with 1% lidocaine . Under intermittent fluoroscopy, lumbar puncture was performed at the L2-L3, L3-L4 and L5-S1 levels using a 20 gauge spinal needle. However, there was no CSF return at these levels. An additional lumbar puncture attempt was offered to  the patient, but the patient elected to terminate the procedure. The inner stylet was replaced within the needle and the needle was removed in its entirety. Dressings were applied to the  skin entry sites. No immediate post-procedure complication was apparent. The procedure was performed by Carlin Griffon, PA-C, supervised by Dr. Rockey Childs. FLUOROSCOPY: Radiation Exposure Index (as provided by the fluoroscopic device): 28.8 mGy Kerma IMPRESSION: 1. Fluoroscopically-guided lumbar punctures performed at the L2-L3, L3-L4 and L5-S1 levels without CSF return. 2. No immediate post-procedure complication. Electronically Signed   By: Rockey Childs D.O.   On: 06/08/2024 14:28   Overnight EEG with video Result Date: 06/08/2024 Shelton Arlin KIDD, MD     06/09/2024  9:34 AM Patient Name: Gabriela Moyer MRN: [id number] Epilepsy Attending: Arlin KIDD Shelton Referring Physician/Provider: Shelton Arlin KIDD, MD Duration: 06/07/2024 1205 to 06/08/2024 1515 Patient history: 83 y.o. female with visual disturbance. EEG to evaluate for seizure.  Level of alertness: Awake, asleep  AEDs during EEG study: LEV, VPA  Technical aspects: This EEG study was done with scalp electrodes positioned according to the 10-20 International system of electrode placement. Electrical activity was reviewed with band pass filter of 1-70Hz , sensitivity of 7 uV/mm, display speed of 61mm/sec with a 60Hz  notched filter applied as appropriate. EEG data were recorded continuously and digitally stored.  Video monitoring was available and reviewed as appropriate.  Description: The posterior dominant rhythm consists of 8-9 Hz activity of moderate voltage (25-35 uV) seen predominantly in posterior head regions, asymmetric ( right<left) and reactive to eye opening and eye closing.  Sleep was characterized by vertex waves, sleep spindles (12-15Hz ), maximal frontocentral region.  EEG showed rhythmic sharply contoured 3 to 6 Hz theta-delta slowing in right>left occipital  region   Hyperventilation and photic stimulation were not performed.    Frequent seizures without clinical signs were noted during the study.  During the seizure EEG showed 12 to 13 Hz sharply contoured beta activity in right occipital region which then involve left occipital region and evolved into high amplitude sharply contoured 5 to 6 Hz theta slowing followed by 2 to 3 Hz delta slowing.  At the beginning of the study, average 7-10 seizures were noted per hour lasting duration of seizures was about 1 minute each.  Gradually as medications were adjusted, after around 2200, seizure frequency improved.  Subsequently average 1 seizure was noted per hour lasting about 1-2 minutes each. After around 0900, seizure frequency worsened and eeg showed 3-4 seizures/hour lasting about 1 minute each. EEG was disconnected on 06/07/2024 between 1601 to 1911 for imaging.  ABNORMALITY - Focal seizure, right occipital region - Background asymmetry, right<left - Intermittent slow, right>left occipital region  IMPRESSION: This study showed focal seizures without clinical signs arising from right occipital region. At the beginning of the study, average 7-10 seizures were noted per hour lasting duration of seizures was about 1 minute each.  Gradually his medications were adjusted, after around 2200, seizure frequency improved.  Subsequently average 1 seizure was noted per hour lasting about 1-2 minutes each.  After around 0900, seizure frequency worsened and eeg showed 3-4 seizures/hour lasting about 1 minute each.  Additionally there was evidence of cortical dysfunction in right > left occipital region likely secondary to underlying structural abnormality.  Dr. Lindzen And Dr Arora were intermittently notified.   Arlin KIDD Shelton    MR Lumbar Spine W Wo Contrast Result Date: 06/08/2024 CLINICAL DATA:  Initial evaluation for leptomeningeal disease. EXAM: MRI LUMBAR SPINE WITHOUT AND WITH CONTRAST TECHNIQUE: Multiplanar and multiecho  pulse sequences of the lumbar spine were obtained without and with intravenous contrast. CONTRAST:  7mL GADAVIST  GADOBUTROL  1 MMOL/ML IV SOLN COMPARISON:  Brain MRI from 06/06/2024. FINDINGS: Segmentation: Examination somewhat degraded by motion artifact. Standard segmentation. Lowest well-formed disc space labeled the L5-S1 level. Alignment: Mild-to-moderate levoscoliosis. Trace degenerative retrolisthesis of L1 on L2. Vertebrae: Vertebral body height maintained without acute or chronic fracture. Bone marrow signal intensity mildly heterogeneous but overall within normal limits. No worrisome osseous lesions. Mild degenerative reactive endplate changes present about the L3-4 interspace. No evidence for osteomyelitis discitis or septic arthritis. Conus medullaris and cauda equina: Conus extends to the L1-2 level. Suspected subtle leptomeningeal enhancement about the distal cord (series 12, image 5 for example). Normal signal intensity within the distal cord and conus itself. No convincing involvement of the nerve roots of the cauda equina is seen. Paraspinal and other soft tissues: Paraspinous soft tissues demonstrate no acute finding. Multiple scattered cystic lesions noted about the visualized kidneys, a few of which within the right kidney are mildly complex in appearance. Disc levels: L1-2: Mild diffuse disc bulge with reactive endplate spurring. Mild bilateral facet spurring. No spinal stenosis. Foramina remain patent. L2-3: Moderate degenerative intervertebral disc space narrowing with diffuse disc bulge. Reactive endplate change with marginal endplate osteophytic spurring. Mild bilateral facet hypertrophy. No significant spinal stenosis. Foramina remain adequately patent. L3-4: Degenerative intervertebral disc space narrowing with diffuse disc bulge in disc desiccation. Reactive endplate spurring. Moderate bilateral facet hypertrophy. No significant spinal stenosis. Mild bilateral L3 foraminal stenosis. L4-5:  Degenerative intervertebral disc space narrowing with disc desiccation and mild diffuse disc bulge. Reactive endplate spurring. Mild to moderate bilateral facet hypertrophy. No significant spinal stenosis. Foramina remain patent. L5-S1: Negative interspace. Moderate bilateral facet arthrosis. No canal or foraminal stenosis. IMPRESSION: 1. Suspected subtle leptomeningeal enhancement about the distal cord. Finding is nonspecific, and could be either infectious or inflammatory in nature. Possible neoplastic process with leptomeningeal carcinomatosis could also be considered. 2. Underlying multilevel degenerative spondylosis and facet arthrosis as above. No significant spinal stenosis. Mild bilateral L3 foraminal narrowing. 3. Multiple cystic lesions about the visualized right kidney, a few which are mildly complex in appearance and incompletely evaluated on this exam. Further evaluation with dedicated renal mass protocol CT and/or MRI suggested for further evaluation. Electronically Signed   By: Morene Hoard M.D.   On: 06/08/2024 03:41   MR THORACIC SPINE W WO CONTRAST Result Date: 06/08/2024 CLINICAL DATA:  Initial evaluation for leptomeningeal disease. EXAM: MRI THORACIC WITHOUT AND WITH CONTRAST TECHNIQUE: Multiplanar and multiecho pulse sequences of the thoracic spine were obtained without and with intravenous contrast. CONTRAST:  7mL GADAVIST  GADOBUTROL  1 MMOL/ML IV SOLN COMPARISON:  Prior brain MRI from 06/06/2024. FINDINGS: Alignment: Mild dextroscoliosis. Alignment otherwise normal with preservation of the normal thoracic kyphosis. No significant listhesis. Vertebrae: Vertebral body height maintained without acute or chronic fracture. Bone marrow signal intensity diffusely heterogeneous but overall within normal limits. Few scattered benign hemangiomata noted. No worrisome osseous lesions. Degenerative reactive endplate change with marrow edema present about the T1-2 and T8-9 interspaces. No evidence  for osteomyelitis discitis or septic arthritis. Cord: Suspected subtle intermittent left a meningeal enhancement seen about the thoracic cord. Changes are most pronounced about the right dorsal surface of the lower cord at the level of T11-12 (series 29, images 20, 21). Probable additional subtle patchy involvement elsewhere as well. Normal signal and morphology within the cord itself. Paraspinal and other soft tissues: Paraspinous soft tissues demonstrate no acute finding. Trace layering bilateral pleural effusions. Multiple scattered cystic lesions noted within the visualized kidneys, a few of which are somewhat complex in appearance with  T2 hypointensity. These are incompletely assessed on this exam. Disc levels: Ordinary for age multilevel disc bulging with reactive endplate spurring noted within the thoracic spine. No significant focal disc herniation. Mild multilevel facet degeneration. No significant spinal stenosis. Foramina remain patent. IMPRESSION: 1. Subtle intermittent leptomeningeal enhancement about the thoracic cord, most pronounced about the right dorsal surface of the lower cord at the level of T11-12. Findings are again nonspecific, and could be either infectious or inflammatory in nature. Possible neoplastic process not excluded. 2. Underlying ordinary for age thoracic spondylosis without significant spinal stenosis. 3. Multiple scattered cystic lesions within the visualized kidneys, a few of which are mildly complex in appearance. These are incompletely assessed on this exam. Further evaluation with dedicated renal mass protocol CT and/or MRI suggested for further evaluation. Electronically Signed   By: Morene Hoard M.D.   On: 06/08/2024 03:30   MR CERVICAL SPINE W WO CONTRAST Result Date: 06/08/2024 CLINICAL DATA:  Follow-up examination for leptomeningeal disease. EXAM: MRI CERVICAL SPINE WITHOUT AND WITH CONTRAST TECHNIQUE: Multiplanar and multiecho pulse sequences of the cervical  spine, to include the craniocervical junction and cervicothoracic junction, were obtained without and with intravenous contrast. CONTRAST:  7mL GADAVIST  GADOBUTROL  1 MMOL/ML IV SOLN COMPARISON:  Comparison made with brain MRI from 06/06/2024. FINDINGS: Alignment: Straightening with mild reversal of the normal cervical lordosis. No significant listhesis. Vertebrae: Vertebral body height maintained without acute or chronic fracture. Bone marrow signal intensity diffusely heterogeneous but overall within normal limits. No worrisome osseous lesions. No evidence for osteomyelitis discitis or septic arthritis. Cord: Suspected intermittent subtle leptomeningeal enhancement about the cervical cord, most pronounced along the ventral surface of the upper cervical cord at C1 through C4 (series 7, image 8). Normal signal seen within the cord itself. No other abnormal enhancement. Posterior Fossa, vertebral arteries, paraspinal tissues: Craniocervical junction within normal limits. Paraspinous soft tissues within normal limits. Normal flow voids seen within the vertebral arteries bilaterally. Disc levels: C2-C3: Disc bulge with left-sided uncovertebral spurring. Left-sided facet hypertrophy. No spinal stenosis. Moderate left C3 foraminal narrowing. Right neural foramina remains patent. C3-C4: Disc bulge with bilateral uncovertebral spurring. Right worse than left facet hypertrophy with small joint effusion on the right. No significant spinal stenosis. Moderate to severe bilateral C4 foraminal narrowing. C4-C5: Degenerate intervertebral disc space narrowing. Central to right paracentral disc osteophyte complex indents the ventral thecal sac. Mild cord flattening without cord signal changes. Mild spinal stenosis. Uncovertebral spurring with resultant moderate right and mild left C5 foraminal stenosis. C5-C6: Ad C5 and C6 vertebral bodies are ankylosed, likely postoperative in nature. Endplate spurring indents the ventral thecal  sac with residual mild spinal stenosis. Foramina appear patent. C6-C7: Degenerative intervertebral disc space narrowing with diffuse disc osteophyte complex. Flattening and partial effacement of the ventral thecal sac, worse on the right. Mild spinal stenosis. Foramina remain patent. C7-T1: Mild disc bulge with uncovertebral spurring. Moderate right facet hypertrophy. No spinal stenosis. Foramina remain patent. IMPRESSION: 1. Suspected subtle leptomeningeal enhancement about the cervical cord as above. Findings are nonspecific, and could be either infectious or inflammatory in nature. Possible metastatic disease could also be considered given the corresponding findings on prior brain MRI. 2. 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 3. Multifactorial degenerative changes with resultant multilevel foraminal narrowing as above. Notable findings include moderate left C3 foraminal stenosis, moderate to severe bilateral C4 foraminal narrowing, with moderate right C5 foraminal stenosis. Electronically Signed   By: Morene Hoard HERO.D.  On: 06/08/2024 03:18   MR ANGIO HEAD WO CONTRAST Result Date: 06/07/2024 CLINICAL DATA:  Initial evaluation for acute CVA. EXAM: MRA NECK WITHOUT AND WITH CONTRAST MRA HEAD WITHOUT AND WITH CONTRAST TECHNIQUE: Multiplanar and multiecho pulse sequences of the neck were obtained without and with intravenous contrast. Angiographic images of the neck were obtained using MRA technique without and with intravenous contast.; Angiographic images of the Circle of Willis were obtained using MRA technique without and with intravenous contrast. CONTRAST:  7mL GADAVIST  GADOBUTROL  1 MMOL/ML IV SOLN COMPARISON:  None Available. FINDINGS: MRA NECK FINDINGS Aortic arch: Examination technically limited by timing of the contrast bolus and motion artifact. Visualized aortic arch within normal limits for caliber with standard branch pattern. No visible  stenosis or other abnormality about the origin the great vessels. Right carotid system: Right common and internal carotid arteries are patent with antegrade flow. No evidence for dissection. Mild atheromatous irregularity about the right carotid bulb without hemodynamically significant greater than 50% stenosis. Left carotid system: Left common and internal carotid arteries are patent with antegrade flow. No evidence for dissection. Minor atheromatous irregularity about the left carotid bulb without hemodynamically significant greater than 50% stenosis. Vertebral arteries: Both vertebral arteries arise from subclavian arteries. Vertebral arteries are patent with antegrade flow. No evidence for dissection. No hemodynamically significant stenosis. Other: None. MRA HEAD FINDINGS Anterior circulation: Both internal carotid arteries are patent through the siphons without significant stenosis or other abnormality. A1 segments patent bilaterally. Normal anterior communicating artery complex. Anterior cerebral arteries patent without significant stenosis. No M1 stenosis or occlusion. Distal MCA branches perfused and symmetric. Posterior circulation: Both V4 segments patent without significant stenosis. Both PICA patent at their origins. Basilar patent without significant stenosis. Superior cerebral arteries patent bilaterally. Both PCAs primarily supplied via the basilar. PCAs are patent to their distal aspects without significant stenosis. Anatomic variants: None significant. Other: No intracranial aneurysm or other vascular malformation. IMPRESSION: MRA HEAD: Negative intracranial MRA for large vessel occlusion or other emergent finding. No hemodynamically significant or correctable stenosis. MRA NECK: 1. Negative MRA of the neck for large vessel occlusion or other emergent finding. 2. Mild for age atheromatous irregularity about the carotid bifurcations without hemodynamically significant stenosis. Electronically Signed    By: Morene Hoard M.D.   On: 06/07/2024 20:49   MR ANGIO NECK W WO CONTRAST Result Date: 06/07/2024 CLINICAL DATA:  Initial evaluation for acute CVA. EXAM: MRA NECK WITHOUT AND WITH CONTRAST MRA HEAD WITHOUT AND WITH CONTRAST TECHNIQUE: Multiplanar and multiecho pulse sequences of the neck were obtained without and with intravenous contrast. Angiographic images of the neck were obtained using MRA technique without and with intravenous contast.; Angiographic images of the Circle of Willis were obtained using MRA technique without and with intravenous contrast. CONTRAST:  7mL GADAVIST  GADOBUTROL  1 MMOL/ML IV SOLN COMPARISON:  None Available. FINDINGS: MRA NECK FINDINGS Aortic arch: Examination technically limited by timing of the contrast bolus and motion artifact. Visualized aortic arch within normal limits for caliber with standard branch pattern. No visible stenosis or other abnormality about the origin the great vessels. Right carotid system: Right common and internal carotid arteries are patent with antegrade flow. No evidence for dissection. Mild atheromatous irregularity about the right carotid bulb without hemodynamically significant greater than 50% stenosis. Left carotid system: Left common and internal carotid arteries are patent with antegrade flow. No evidence for dissection. Minor atheromatous irregularity about the left carotid bulb without hemodynamically significant greater than 50% stenosis. Vertebral arteries: Both  vertebral arteries arise from subclavian arteries. Vertebral arteries are patent with antegrade flow. No evidence for dissection. No hemodynamically significant stenosis. Other: None. MRA HEAD FINDINGS Anterior circulation: Both internal carotid arteries are patent through the siphons without significant stenosis or other abnormality. A1 segments patent bilaterally. Normal anterior communicating artery complex. Anterior cerebral arteries patent without significant stenosis. No  M1 stenosis or occlusion. Distal MCA branches perfused and symmetric. Posterior circulation: Both V4 segments patent without significant stenosis. Both PICA patent at their origins. Basilar patent without significant stenosis. Superior cerebral arteries patent bilaterally. Both PCAs primarily supplied via the basilar. PCAs are patent to their distal aspects without significant stenosis. Anatomic variants: None significant. Other: No intracranial aneurysm or other vascular malformation. IMPRESSION: MRA HEAD: Negative intracranial MRA for large vessel occlusion or other emergent finding. No hemodynamically significant or correctable stenosis. MRA NECK: 1. Negative MRA of the neck for large vessel occlusion or other emergent finding. 2. Mild for age atheromatous irregularity about the carotid bifurcations without hemodynamically significant stenosis. Electronically Signed   By: Morene Hoard M.D.   On: 06/07/2024 20:49   ECHOCARDIOGRAM COMPLETE Result Date: 06/07/2024    ECHOCARDIOGRAM REPORT   Patient Name:   Gabriela Moyer Date of Exam: 06/07/2024 Medical Rec #:  [id number]      Height:       62.0 in Accession #:    [id number]     Weight:       163.1 lb Date of Birth:  Mar 17, 1941      BSA:          1.753 m Patient Age:    83 years       BP:           104/68 mmHg Patient Gender: F              HR:           62 bpm. Exam Location:  Inpatient Procedure: 2D Echo, Cardiac Doppler, Color Doppler and Saline Contrast Bubble            Study (Both Spectral and Color Flow Doppler were utilized during            procedure). Indications:    Stroke I63.9  History:        Patient has prior history of Echocardiogram examinations, most                 recent 12/18/2016.  Sonographer:    Tinnie Gosling RDCS Referring Phys: [phone] [name] IMPRESSIONS  1. Left ventricular ejection fraction, by estimation, is 55 to 60%. The left ventricle has normal function. The left ventricle has no regional wall motion abnormalities.  Left ventricular diastolic parameters are consistent with Grade II diastolic dysfunction (pseudonormalization).  2. Right ventricular systolic function is normal. The right ventricular size is normal. Tricuspid regurgitation signal is inadequate for assessing PA pressure.  3. The mitral valve is normal in structure. No evidence of mitral valve regurgitation. No evidence of mitral stenosis.  4. The aortic valve is tricuspid. There is mild calcification of the aortic valve. Aortic valve regurgitation is mild. No aortic stenosis is present.  5. The inferior vena cava is normal in size with greater than 50% respiratory variability, suggesting right atrial pressure of 3 mmHg.  6. Agitated saline contrast bubble study was negative, with no evidence of any interatrial shunt. FINDINGS  Left Ventricle: Left ventricular ejection fraction, by estimation, is 55 to 60%. The left ventricle has normal function. The  left ventricle has no regional wall motion abnormalities. The left ventricular internal cavity size was normal in size. There is  no left ventricular hypertrophy. Left ventricular diastolic parameters are consistent with Grade II diastolic dysfunction (pseudonormalization). Right Ventricle: The right ventricular size is normal. No increase in right ventricular wall thickness. Right ventricular systolic function is normal. Tricuspid regurgitation signal is inadequate for assessing PA pressure. Left Atrium: Left atrial size was normal in size. Right Atrium: Right atrial size was normal in size. Pericardium: There is no evidence of pericardial effusion. Mitral Valve: The mitral valve is normal in structure. Mild mitral annular calcification. No evidence of mitral valve regurgitation. No evidence of mitral valve stenosis. Tricuspid Valve: The tricuspid valve is normal in structure. Tricuspid valve regurgitation is trivial. No evidence of tricuspid stenosis. Aortic Valve: The aortic valve is tricuspid. There is mild  calcification of the aortic valve. Aortic valve regurgitation is mild. No aortic stenosis is present. Pulmonic Valve: The pulmonic valve was normal in structure. Pulmonic valve regurgitation is not visualized. No evidence of pulmonic stenosis. Aorta: The aortic root is normal in size and structure. Venous: The inferior vena cava is normal in size with greater than 50% respiratory variability, suggesting right atrial pressure of 3 mmHg. IAS/Shunts: No atrial level shunt detected by color flow Doppler. Agitated saline contrast was given intravenously to evaluate for intracardiac shunting. Agitated saline contrast bubble study was negative, with no evidence of any interatrial shunt.  LEFT VENTRICLE PLAX 2D LVIDd:         3.40 cm   Diastology LVIDs:         2.40 cm   LV e' medial:    3.70 cm/s LV PW:         1.20 cm   LV E/e' medial:  21.6 LV IVS:        1.20 cm   LV e' lateral:   4.46 cm/s LVOT diam:     1.70 cm   LV E/e' lateral: 17.9 LV SV:         75 LV SV Index:   43 LVOT Area:     2.27 cm  RIGHT VENTRICLE         IVC TAPSE (M-mode): 2.2 cm  IVC diam: 1.80 cm LEFT ATRIUM             Index        RIGHT ATRIUM          Index LA diam:        3.00 cm 1.71 cm/m   RA Area:     9.57 cm LA Vol (A2C):   40.4 ml 23.04 ml/m  RA Volume:   17.30 ml 9.87 ml/m LA Vol (A4C):   49.9 ml 28.46 ml/m LA Biplane Vol: 48.0 ml 27.38 ml/m  AORTIC VALVE LVOT Vmax:   124.00 cm/s LVOT Vmean:  89.900 cm/s LVOT VTI:    0.332 m  AORTA Ao Root diam: 2.40 cm Ao Asc diam:  3.20 cm MITRAL VALVE MV Area (PHT): 2.71 cm     SHUNTS MV Decel Time: 280 msec     Systemic VTI:  0.33 m MV E velocity: 79.90 cm/s   Systemic Diam: 1.70 cm MV A velocity: 134.00 cm/s MV E/A ratio:  0.60 Morene Brownie Electronically signed by Morene Brownie Signature Date/Time: 06/07/2024/2:00:24 PM    Final    EEG adult Result Date: 06/07/2024 Shelton Arlin KIDD, MD     06/07/2024 10:17 AM Patient Name: Gabriela Moyer  MRN: [id number] Epilepsy Attending: Arlin MALVA Krebs  Referring Physician/Provider: Khaliqdina, Salman, MD Date: 06/07/2024 Duration: 22.30 mins Patient history: 83 y.o. female with visual disturbance. EEG to evaluate for seizure. Level of alertness: Awake AEDs during EEG study: None Technical aspects: This EEG study was done with scalp electrodes positioned according to the 10-20 International system of electrode placement. Electrical activity was reviewed with band pass filter of 1-70Hz , sensitivity of 7 uV/mm, display speed of 65mm/sec with a 60Hz  notched filter applied as appropriate. EEG data were recorded continuously and digitally stored.  Video monitoring was available and reviewed as appropriate. Description: The posterior dominant rhythm consists of 8-9 Hz activity of moderate voltage (25-35 uV) seen predominantly in posterior head regions, asymmetric ( right<left) and reactive to eye opening and eye closing. Hyperventilation and photic stimulation were not performed.   2 seizures were noted during the study at 0825 and 0833.  During the seizure, no clinical signs were noted.  EEG showed 12 to 13 Hz sharply contoured beta activity in right occipital region which then involve left occipital region and evolved into high amplitude sharply contoured 5 to 6 Hz theta slowing followed by 2 to 3 Hz delta slowing.  Duration of seizures was about 1 minute 10 seconds each ABNORMALITY - Focal seizure, right occipital region - Background asymmetry, right<left IMPRESSION: This study showed 2 focal seizures without clinical signs arising from right occipital region at 0825 and 0833 lasting for about 1 minute 10 seconds each. Additionally there was evidence of cortical dysfunction in right occipital region likely secondary to underlying structural abnormality. Dr. Lindzen was notified. Priyanka O Yadav   MR Brain W and Wo Contrast Result Date: 06/06/2024 CLINICAL DATA:  Headache, new onset. Concern for optic neuritis, suspected binocular vision loss. History of complex  migraines. EXAM: MRI HEAD AND ORBITS WITHOUT AND WITH CONTRAST TECHNIQUE: Multiplanar, multiecho pulse sequences of the brain and surrounding structures were obtained without and with intravenous contrast. Multiplanar, multiecho pulse sequences of the orbits and surrounding structures were obtained including fat saturation techniques, before and after intravenous contrast administration. CONTRAST:  7mL GADAVIST  GADOBUTROL  1 MMOL/ML IV SOLN COMPARISON:  Earlier same day CT head. FINDINGS: MRI HEAD FINDINGS Brain: There is restricted diffusion involving the cortex and subcortical white matter in the right occipital lobe with mild associated edema and sulcal effacement. There is leptomeningeal/pial-arachnoid enhancement within this region. Additional areas of enhancement along the sulci in the posterior left frontal lobe and high left parietal lobe corresponding to areas of abnormality on same day CT concerning for additional leptomeningeal/pial-arachnoid enhancement. There is abnormal sulcal FLAIR hyperintensity in this region without evidence of parenchymal edema or enhancement. Additional diffuse dural enhancement noted. T2/FLAIR hyperintensity in the periventricular and subcortical white matter. No evidence of intracranial hemorrhage. No midline shift. The basilar cisterns are patent. No extra-axial fluid collections. Ventricles: Normal size and configuration of the ventricles. Mildly T1 hypointense focus along the ependymal surface of the right lateral ventricle with signal similar to cortical gray matter which may reflect focus of heterotopic gray matter. No associated enhancement. Vascular: Skull base flow voids are visualized. Skull and upper cervical spine: No focal abnormality. Other: Mastoid air cells are clear. MRI ORBITS FINDINGS Orbits: No traumatic or inflammatory finding. Globes, optic nerves, orbital fat, extraocular muscles, vascular structures, and lacrimal glands are normal. There is no edema or  enlargement of the optic nerves or abnormal enhancement to suggest optic neuritis. Visualized sinuses: Clear. Soft tissues: Negative. IMPRESSION: Leptomeningeal/pial-arachnoid enhancement in the  posterior left frontal lobe and high left parietal lobe corresponding to area of abnormality on CT. No definite underlying parenchymal enhancement or edema. 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 Findings most concerning for meningitis and possible focus of encephalitis in the right occipital lobe. Recommend lumbar puncture for further evaluation. Differential could also include leptomeningeal carcinomatosis. Complex migraine and associated vasospasm could potentially lead to infarct although the degree of enhancement especially in the frontoparietal lobes is atypical. Diffuse dural enhancement noted. Nodule along the surface of the right lateral ventricle with signal intensity similar to cortical gray matter without associated enhancement, suggestive of gray matter heterotopia. Unremarkable MRI of the orbits.  No findings of optic neuritis. Electronically Signed   By: Donnice Mania M.D.   On: 06/06/2024 17:44   MR ORBITS W WO CONTRAST Result Date: 06/06/2024 CLINICAL DATA:  Headache, new onset. Concern for optic neuritis, suspected binocular vision loss. History of complex migraines. EXAM: MRI HEAD AND ORBITS WITHOUT AND WITH CONTRAST TECHNIQUE: Multiplanar, multiecho pulse sequences of the brain and surrounding structures were obtained without and with intravenous contrast. Multiplanar, multiecho pulse sequences of the orbits and surrounding structures were obtained including fat saturation techniques, before and after intravenous contrast administration. CONTRAST:  7mL GADAVIST  GADOBUTROL  1 MMOL/ML IV SOLN COMPARISON:  Earlier same day CT head. FINDINGS: MRI HEAD FINDINGS Brain: There is restricted diffusion involving the cortex and subcortical  white matter in the right occipital lobe with mild associated edema and sulcal effacement. There is leptomeningeal/pial-arachnoid enhancement within this region. Additional areas of enhancement along the sulci in the posterior left frontal lobe and high left parietal lobe corresponding to areas of abnormality on same day CT concerning for additional leptomeningeal/pial-arachnoid enhancement. There is abnormal sulcal FLAIR hyperintensity in this region without evidence of parenchymal edema or enhancement. Additional diffuse dural enhancement noted. T2/FLAIR hyperintensity in the periventricular and subcortical white matter. No evidence of intracranial hemorrhage. No midline shift. The basilar cisterns are patent. No extra-axial fluid collections. Ventricles: Normal size and configuration of the ventricles. Mildly T1 hypointense focus along the ependymal surface of the right lateral ventricle with signal similar to cortical gray matter which may reflect focus of heterotopic gray matter. No associated enhancement. Vascular: Skull base flow voids are visualized. Skull and upper cervical spine: No focal abnormality. Other: Mastoid air cells are clear. MRI ORBITS FINDINGS Orbits: No traumatic or inflammatory finding. Globes, optic nerves, orbital fat, extraocular muscles, vascular structures, and lacrimal glands are normal. There is no edema or enlargement of the optic nerves or abnormal enhancement to suggest optic neuritis. Visualized sinuses: Clear. Soft tissues: Negative. IMPRESSION: Leptomeningeal/pial-arachnoid enhancement in the posterior left frontal lobe and high left parietal lobe corresponding to area of abnormality on CT. No definite underlying parenchymal enhancement or edema. 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 Findings most concerning for meningitis and possible focus of encephalitis in the right occipital lobe. Recommend lumbar  puncture for further evaluation. Differential could also include leptomeningeal carcinomatosis. Complex migraine and associated vasospasm could potentially lead to infarct although the degree of enhancement especially in the frontoparietal lobes is atypical. Diffuse dural enhancement noted. Nodule along the surface of the right lateral ventricle with signal intensity similar to cortical gray matter without associated enhancement, suggestive of gray matter heterotopia. Unremarkable MRI of the orbits.  No findings of optic neuritis. Electronically Signed   By: Donnice Mania M.D.   On: 06/06/2024 17:44  CT Head Wo Contrast Result Date: 06/06/2024 CLINICAL DATA:  Headache EXAM: CT HEAD WITHOUT CONTRAST TECHNIQUE: Contiguous axial images were obtained from the base of the skull through the vertex without intravenous contrast. RADIATION DOSE REDUCTION: This exam was performed according to the departmental dose-optimization program which includes automated exposure control, adjustment of the mA and/or kV according to patient size and/or use of iterative reconstruction technique. COMPARISON:  06/19/2019 FINDINGS: Brain: Areas of high density in left frontal region. It is difficult to determine if this is subarachnoid hemorrhage or within the cortex. Low-density area in the right occipital lobe. This could reflect infarct, or edema related to an underlying lesion. Small high-density 6 mm area adjacent to the right lateral ventricle concerning for brain lesion. No hydrocephalus. Vascular: No hyperdense vessel or unexpected calcification. Skull: No acute calvarial abnormality. Sinuses/Orbits: No acute findings Other: None IMPRESSION: Amorphous high-density areas noted in the left frontal region along the cortex and sulci. It is difficult to determine if this is subarachnoid hemorrhage or within the cortex. 6 mm small hyperdense area adjacent to the right lateral ventricle concerning for small brain lesion. Low-density area  in the right occipital lobe could reflect infarct or edema related to underlying lesion. Recommend MRI without and with contrast for further evaluation. Electronically Signed   By: Franky Crease M.D.   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  Overall picture is not fully clear, but prognosis appears quite limited based on available data.  Regardless of source and formal diagnosis, life prolonging treatment would inevitably involve further staging (bone marrow sampling) and high dose IV chemotherapy.  This is not recommended at this time due to poor clinical/functional status, and otherwise poor overall prognosis.  We discussed goals of care at length at bedside.  Family is interested in meeting with palliative care with plans to proceed with home hospice.  We expressed support for this pathway.  Patient and family would like to meet with palliative, we will inform primary team.  We are happy to remain available for further questions and concerns from the primary team, consulting teams, or patient and family.  All questions were answered. The patient knows to call the clinic with any problems, questions or concerns.  The total time spent in the encounter was 80 minutes and more than 50% was on counseling and review of test results     Arthea MARLA Manns, MD 06/24/2024 4:59 PM

## 2024-06-24 NOTE — Progress Notes (Signed)
 Occupational Therapy Treatment Patient Details Name: Gabriela Moyer MRN: [id number] DOB: [birth year] Today's Date: 06/24/2024   History of present illness Pt is an 83 y/o F admitted on 06/06/24 after presenting with c/o HA x 4 days, as well as vertigo & vision changes. MRI brain w + w /o C which demonstrated leptomeningeal/pial-arachnoid enhancement in the posterior left frontal lobe and high left parietal lobe corresponding to area of abnormality on CT. 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 6/19 open meningeal biopsy with concern for lymphoma. PMH: chronic migraine, HTN, HLD, extensive PAD of subclavian, axillary, brachial & renal artery s/p R renal artery stent 2010, hypothyroidism, paroxysmal a-fib.   OT comments  Pt progressing well towards OT goals this session. Pt continues to demonstrate L visual deficit and does not recall/implement compensatory strategies from previous sessions. Pt eager to work with therapy and min A for bed mobility, min A for transfers with RW, mod A for seated grooming ADL tasks with decreased task initiation. Pt's sister Ramsey) and daughter Francie) present and supportive throughout session. Pt continues to benefit from skilled OT in the acute setting and afterwards at rehab of >3 hours daily. Therapy should continue to focus on visual deficits, L sided weakness/inattention, balance, activity tolerance, and cognition.       If plan is discharge home, recommend the following:  A little help with walking and/or transfers;A little help with bathing/dressing/bathroom;Assist for transportation;Help with stairs or ramp for entrance   Equipment Recommendations  Other (comment) (defer to next venue)    Recommendations for Other Services      Precautions / Restrictions Precautions Precautions: Fall Recall of Precautions/Restrictions: Impaired Precaution/Restrictions Comments: left visual field cut, orthostatic Restrictions Weight Bearing  Restrictions Per Provider Order: No       Mobility Bed Mobility Overal bed mobility: Needs Assistance Bed Mobility: Supine to Sit, Sit to Supine     Supine to sit: Min assist, HOB elevated Sit to supine: Min assist   General bed mobility comments: MinA for trunk raise. Min A to return to supine for LE management    Transfers Overall transfer level: Needs assistance Equipment used: Rolling walker (2 wheels) Transfers: Sit to/from Stand Sit to Stand: Min assist           General transfer comment: MinA for slight boost-up, cues for hand placement     Balance Overall balance assessment: Needs assistance Sitting-balance support: Feet supported Sitting balance-Leahy Scale: Good     Standing balance support: Bilateral upper extremity supported, During functional activity, Reliant on assistive device for balance Standing balance-Leahy Scale: Poor Standing balance comment: reliant on RW and external support                           ADL either performed or assessed with clinical judgement   ADL Overall ADL's : Needs assistance/impaired     Grooming: Moderate assistance;Wash/dry face;Oral care;Sitting Grooming Details (indicate cue type and reason): hand over hand required, improving initiation, increased cues for L side items and use of UE             Lower Body Dressing: Maximal assistance;Bed level Lower Body Dressing Details (indicate cue type and reason): donning socks Toilet Transfer: Minimal assistance;Ambulation;Rolling walker (2 wheels) Toilet Transfer Details (indicate cue type and reason): vc for safe hand placement on the RW         Functional mobility during ADLs: Moderate assistance;Cueing for sequencing;Cueing for safety;Rolling  walker (2 wheels) General ADL Comments: Pt very motivated today, requiring mod cues for items/tasks on L (including using LUE), tearful initially but cheered up with encouragement from daughter and sister     Extremity/Trunk Assessment Upper Extremity Assessment Upper Extremity Assessment: Generalized weakness;Right hand dominant;LUE deficits/detail LUE Deficits / Details: will use, but with cues, decreased strength and ROM - did not complete Bil hand activities this session LUE Sensation: decreased light touch;decreased proprioception LUE Coordination: decreased fine motor;decreased gross motor            Vision   Visual Fields: Left visual field deficit Additional Comments: eyes open more this session but continues to require significant auditory cues to find objects on the left   Perception Perception Perception: Impaired Preception Impairment Details: Inattention/Neglect Perception-Other Comments: L inattention   Praxis Praxis Praxis: Impaired   Communication Communication Communication: Impaired Factors Affecting Communication: Difficulty expressing self (improving)   Cognition Arousal: Alert Behavior During Therapy: Flat affect Cognition: Cognition impaired Difficult to assess due to: Impaired communication   Awareness: Online awareness impaired Memory impairment (select all impairments): Working memory Attention impairment (select first level of impairment): Sustained attention Executive functioning impairment (select all impairments): Initiation, Problem solving, Reasoning OT - Cognition Comments: L visual deficits also impacting functional task completion. following one step commands with extra time. Thought that her daughter was her sister - but could call her by name.                 Following commands: Impaired Following commands impaired: Follows one step commands with increased time, Follows multi-step commands inconsistently      Cueing   Cueing Techniques: Verbal cues, Gestural cues, Tactile cues  Exercises      Shoulder Instructions       General Comments      Pertinent Vitals/ Pain       Pain Assessment Pain Assessment: No/denies  pain Pain Intervention(s): Monitored during session, Repositioned  Home Living                                          Prior Functioning/Environment              Frequency  Min 2X/week        Progress Toward Goals  OT Goals(current goals can now be found in the care plan section)  Progress towards OT goals: Progressing toward goals  Acute Rehab OT Goals Patient Stated Goal: get stronger again OT Goal Formulation: With patient Time For Goal Achievement: 07/05/24 Potential to Achieve Goals: Good  Plan      Co-evaluation                 AM-PAC OT 6 Clicks Daily Activity     Outcome Measure   Help from another person eating meals?: A Lot Help from another person taking care of personal grooming?: A Lot Help from another person toileting, which includes using toliet, bedpan, or urinal?: A Lot Help from another person bathing (including washing, rinsing, drying)?: A Lot Help from another person to put on and taking off regular upper body clothing?: A Lot Help from another person to put on and taking off regular lower body clothing?: A Lot 6 Click Score: 12    End of Session Equipment Utilized During Treatment: Gait belt;Rolling walker (2 wheels)  OT Visit Diagnosis: Unsteadiness on feet (R26.81);Muscle weakness (generalized) (M62.81);Dizziness and giddiness (  R42)   Activity Tolerance Patient tolerated treatment well   Patient Left with family/visitor present;in bed;with bed alarm set;with call bell/phone within reach   Nurse Communication Mobility status (needs purewick replaces)        Time: 1030-1055 OT Time Calculation (min): 25 min  Charges: OT General Charges $OT Visit: 1 Visit OT Treatments $Self Care/Home Management : 23-37 mins  Leita DEL OTR/L Acute Rehabilitation Services Office: [phone]  Leita PARAS [facility] 06/24/2024, 1:28 PM

## 2024-06-24 NOTE — Plan of Care (Signed)
  Problem: Nutrition: Goal: Risk of aspiration will decrease Outcome: Progressing   Problem: Clinical Measurements: Goal: Ability to maintain clinical measurements within normal limits will improve Outcome: Progressing Goal: Will remain free from infection Outcome: Progressing Goal: Diagnostic test results will improve Outcome: Progressing Goal: Respiratory complications will improve Outcome: Progressing Goal: Cardiovascular complication will be avoided Outcome: Progressing   Problem: Activity: Goal: Risk for activity intolerance will decrease Outcome: Progressing   Problem: Elimination: Goal: Will not experience complications related to bowel motility Outcome: Progressing Goal: Will not experience complications related to urinary retention Outcome: Progressing   Problem: Pain Managment: Goal: General experience of comfort will improve and/or be controlled Outcome: Progressing   Problem: Safety: Goal: Ability to remain free from injury will improve Outcome: Progressing   Problem: Skin Integrity: Goal: Risk for impaired skin integrity will decrease Outcome: Progressing

## 2024-06-24 NOTE — Progress Notes (Signed)
  Progress Note   Patient: Gabriela Moyer FMW:[id number] DOB: 1941-10-23 DOA: 06/06/2024     18 DOS: the patient was seen and examined on 06/24/2024 at 10:11AM      Brief hospital course: 83 y.o. F with HTN, PVD (RAS s/p renal artery stent in 2010, subclavian, axillary, coronary), hypothyroidism, pAF not on AC due to hx GIB and HLD who presented with headache, vision change and confusion.  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  Neurology consulted.  EEG showed focal seizures.  Mentation waxing and waning.  Underwent LP which was nondiagnostic, then open meningeal biopsy on 6/19.       Assessment and Plan: Possible acute stroke Leptomeningeal/pial-arachnoid enhancement posterior left frontal lobe and high parietal lobe Focal seizures No seizure activity noted.  She remains delirious, but most of yesterday afternoon, she was lucid and interactive with family.  No seizure activity noted. - Continue Depakote  and Keppra  (oral/IV ordered in case patient is too sedated to take p.o.)   Delirium Agitated and aggressive overnight, but calm by my family well - Continue delirium precautions - Start low-dose Seroquel  Hypothyroidism - Continue levothyroxine   Atherosclerosis - Continue Lipitor - Hold Zetia  and Plavix   Paroxysmal atrial fibrillation Not on anticoagulation - Continue metoprolol   Hypertension Blood pressure near normal - Continue metoprolol  - Hold amlodipine  and HCTZ  Hypomagnesemia  Chronic kidney disease stage IIIa  Orthostatic hypotension With seems to be resolved         Subjective: Patient was agitated and aggressive overnight.  She is finally sleeping this morning.  Yesterday afternoon she was very lucid and interacted with her sister and daughter for a while.  No fever.  No seizures.     Physical Exam: BP (!) 132/55 (BP Location: Right Arm)   Pulse 65   Temp (!) 97.5 F (36.4 C) (Oral)    Resp 16   Ht 5' 2 (1.575 m)   Wt 70.3 kg   SpO2 97%   BMI 28.35 kg/m   Elderly adult female, lying in bed, weak and sluggish, opens eyes sluggishly, does not respond to questions, falls back asleep RRR, no murmurs, no peripheral edema Respiratory rate normal, lungs diminished but no rales or wheezes appreciated Abdomen soft, no grimace to palpation Inattentive, psychomotor slowing severe, generalized weakness    Data Reviewed: Basic metabolic panel normal CBC normal    Family Communication: Son, daughter, daughter-in-law, sister at the bedside    Disposition: Status is: Inpatient         Author: [name], MD 06/24/2024 1:18 PM  For on call review [email or web address].

## 2024-06-24 NOTE — Progress Notes (Signed)
   Providing Compassionate, Quality Care - Together   Subjective: Patient's daughter is at the bedside and reports Gabriela Moyer has a rough night due to confusion/agitation. The patient tells me she just wants to lie down and is tearful.  Objective: Vital signs in last 24 hours: Temp:  [97.3 F (36.3 C)-97.6 F (36.4 C)] 97.5 F (36.4 C) (06/26 0717) Pulse Rate:  [65-84] 65 (06/26 0717) Resp:  [14-16] 16 (06/26 0717) BP: (95-154)/(40-84) 132/55 (06/26 0717) SpO2:  [95 %-98 %] 97 % (06/26 0717)  Intake/Output from previous day: 06/25 0701 - 06/26 0700 In: 495 [P.O.:360; IV Piggyback:135] Out: 1400 [Urine:1400] Intake/Output this shift: No intake/output data recorded.  Patient responds to voice Oriented to self MAE Incision is clean, dry, and intact  Lab Results: Recent Labs    06/23/24 0429 06/24/24 0452  WBC 10.3 10.2  HGB 14.2 14.7  HCT 43.5 44.6  PLT 258 241   BMET Recent Labs    06/23/24 0429 06/24/24 0452  NA 138 139  K 3.9 4.1  CL 100 104  CO2 27 28  GLUCOSE 95 110*  BUN 20 15  CREATININE 0.94 0.96  CALCIUM  9.5 9.4    Studies/Results: No results found.  Assessment/Plan: Patient underwent left bur hole for open brain biopsy by Dr. Mavis on 06/17/2024. 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 Dr. Buckley is following.   LOS: 18 days   -Continue supportive efforts. -Neurosurgery will sign off at this time. Please reconsult if we can be of further assistance.  I am in communication with my attending and they agree with the plan for this patient.   Gabriela Moyer, Gabriela Moyer, Gabriela Moyer Nurse Practitioner  [facility] [address], [place], [place] [address] P: [phone]    F: [phone]  06/24/2024, 12:08 PM

## 2024-06-24 NOTE — Progress Notes (Signed)
 2300H - Patient is emotional and crying. Keeps on telling that she wants to go home. No family member at bedside. She asked to call her sister Nena but no answer. Instead called her son Beryl and daughter Leita, which both responded to the call. She was able to talked to them and asked them to come in the hospital now or else she said I will call 911. Son said he is coming. 2355H -  Son came to patient's room and stayed. 78 -  Heard patient suddenly screaming, went to check on her and she is yelling to his son Give me the phone! repeatedly. Son tried to calm her down. Informed Dr. Terry Hurst regarding patient. [id number] - Patient calms down and went back to sleep.

## 2024-06-24 NOTE — Progress Notes (Signed)
 SLP Cancellation Note  Patient Details Name: Gabriela Moyer MRN: [id number] DOB: [birth year]   Cancelled treatment:       Reason Eval/Treat Not Completed: Patient declined, no reason specified; family declined ST session; pt had just finished with OT/PT and was fatigued.  ST will continue efforts as able.   [name],M.S.,CCC-SLP 06/24/2024, 1:41 PM

## 2024-06-24 NOTE — Progress Notes (Signed)
 Inpatient Rehab Admissions Coordinator:   CIR continues to follow. Pt not medically ready for transfer at this time.   Leita Kleine, MS, CCC-SLP Rehab Admissions Coordinator  [phone] (celll) [phone] (office)
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
## 2024-06-24 NOTE — Plan of Care (Signed)
  Problem: Education: Goal: Knowledge of disease or condition will improve 06/24/2024 0653 by Gaetana Randall Mathew GORMAN, RN Outcome: Progressing 06/24/2024 0512 by Gaetana Randall Mathew GORMAN, RN Outcome: Progressing Goal: Knowledge of secondary prevention will improve (MUST DOCUMENT ALL) 06/24/2024 0653 by Gaetana Randall Mathew GORMAN, RN Outcome: Progressing 06/24/2024 0512 by Gaetana Randall Mathew GORMAN, RN Outcome: Progressing Goal: Knowledge of patient specific risk factors will improve (DELETE if not current risk factor) 06/24/2024 0653 by Gaetana Randall Mathew GORMAN, RN Outcome: Progressing 06/24/2024 0512 by Gaetana Randall Mathew GORMAN, RN Outcome: Progressing   Problem: Nutrition: Goal: Risk of aspiration will decrease 06/24/2024 0653 by Gaetana Randall Mathew GORMAN, RN Outcome: Progressing 06/24/2024 0512 by Gaetana Randall Mathew GORMAN, RN Outcome: Progressing Goal: Dietary intake will improve 06/24/2024 0653 by Gaetana Randall Mathew GORMAN, RN Outcome: Progressing 06/24/2024 0512 by Gaetana Randall Mathew GORMAN, RN Outcome: Progressing   Problem: Clinical Measurements: Goal: Ability to maintain clinical measurements within normal limits will improve 06/24/2024 0653 by Gaetana Randall Mathew GORMAN, RN Outcome: Progressing 06/24/2024 0512 by Gaetana Randall Mathew GORMAN, RN Outcome: Progressing Goal: Will remain free from infection 06/24/2024 0653 by Gaetana Randall Mathew GORMAN, RN Outcome: Progressing 06/24/2024 0512 by Gaetana Randall Mathew GORMAN, RN Outcome: Progressing Goal: Diagnostic test results will improve 06/24/2024 0653 by Gaetana Randall Mathew GORMAN, RN Outcome: Progressing 06/24/2024 0512 by Gaetana Randall Mathew GORMAN, RN Outcome: Progressing Goal: Respiratory complications will improve 06/24/2024 0653 by Gaetana Randall Mathew GORMAN, RN Outcome: Progressing 06/24/2024 0512 by Gaetana Randall Mathew GORMAN, RN Outcome: Progressing Goal: Cardiovascular complication will be avoided 06/24/2024 0653 by Gaetana Randall Mathew GORMAN, RN Outcome: Progressing 06/24/2024  0512 by Gaetana Randall Mathew GORMAN, RN Outcome: Progressing   Problem: Activity: Goal: Risk for activity intolerance will decrease 06/24/2024 0653 by Gaetana Randall Mathew GORMAN, RN Outcome: Progressing 06/24/2024 0512 by Gaetana Randall Mathew GORMAN, RN Outcome: Progressing   Problem: Nutrition: Goal: Adequate nutrition will be maintained 06/24/2024 0653 by Gaetana Randall Mathew GORMAN, RN Outcome: Progressing 06/24/2024 0512 by Gaetana Randall Mathew GORMAN, RN Outcome: Progressing   Problem: Elimination: Goal: Will not experience complications related to bowel motility 06/24/2024 0653 by Gaetana Randall Mathew GORMAN, RN Outcome: Progressing 06/24/2024 0512 by Gaetana Randall Mathew GORMAN, RN Outcome: Progressing Goal: Will not experience complications related to urinary retention 06/24/2024 0653 by Gaetana Randall Mathew GORMAN, RN Outcome: Progressing 06/24/2024 0512 by Gaetana Randall Mathew GORMAN, RN Outcome: Progressing   Problem: Safety: Goal: Ability to remain free from injury will improve 06/24/2024 0653 by Gaetana Randall Mathew GORMAN, RN Outcome: Progressing 06/24/2024 0512 by Gaetana Randall Mathew GORMAN, RN Outcome: Progressing   Problem: Skin Integrity: Goal: Risk for impaired skin integrity will decrease 06/24/2024 0653 by Gaetana Randall Mathew GORMAN, RN Outcome: Progressing 06/24/2024 0512 by Gaetana Randall Mathew GORMAN, RN Outcome: Progressing   Problem: Elimination: Goal: Will not experience complications related to bowel motility 06/24/2024 0653 by Gaetana Randall Mathew GORMAN, RN Outcome: Progressing 06/24/2024 0512 by Gaetana Randall Mathew GORMAN, RN Outcome: Progressing Goal: Will not experience complications related to urinary retention 06/24/2024 0653 by Gaetana Randall Mathew GORMAN, RN Outcome: Progressing 06/24/2024 0512 by Gaetana Randall Mathew GORMAN, RN Outcome: Progressing

## 2024-06-24 NOTE — Plan of Care (Signed)
 Family in to talk with doctor and waiting to see onocologist. New med to start at bedtime. OOB to ambulate in room with assistance.   Problem: Education: Goal: Knowledge of disease or condition will improve Outcome: Progressing   Problem: Nutrition: Goal: Risk of aspiration will decrease Outcome: Progressing   Problem: Activity: Goal: Risk for activity intolerance will decrease Outcome: Progressing   Problem: Safety: Goal: Ability to remain free from injury will improve Outcome: Progressing   Problem: Skin Integrity: Goal: Risk for impaired skin integrity will decrease Outcome: Progressing

## 2024-06-25 DIAGNOSIS — I639 Cerebral infarction, unspecified: Secondary | ICD-10-CM | POA: Diagnosis not present

## 2024-06-25 MED ORDER — OXYCODONE HCL 5 MG PO TABS
5.0000 mg | ORAL_TABLET | Freq: Four times a day (QID) | ORAL | Status: DC | PRN
  Administered 2024-06-25: 5 mg via ORAL
  Filled 2024-06-25: qty 1

## 2024-06-25 NOTE — Plan of Care (Signed)
 Sitter at bedside. No adverse behaviors today. Restraints released. Assisted with meals. Placed on comfort measures. PRN pain med requested and given x 1 dose for pain in legs.    Problem: Education: Goal: Knowledge of disease or condition will improve Outcome: Progressing   Problem: Coping: Goal: Will verbalize positive feelings about self Outcome: Progressing   Problem: Self-Care: Goal: Ability to participate in self-care as condition permits will improve Outcome: Progressing   Problem: Nutrition: Goal: Risk of aspiration will decrease Outcome: Progressing   Problem: Activity: Goal: Risk for activity intolerance will decrease Outcome: Progressing   Problem: Pain Managment: Goal: General experience of comfort will improve and/or be controlled Outcome: Progressing   Problem: Safety: Goal: Ability to remain free from injury will improve Outcome: Progressing   Problem: Skin Integrity: Goal: Risk for impaired skin integrity will decrease Outcome: Progressing

## 2024-06-25 NOTE — Progress Notes (Signed)
 Inpatient Rehab Admissions Coordinator:  Pt/family pending GOC meeting on 6/28. Will continue to follow pending plan derived from meeting.   Tinnie Yvone Cohens, MS, CCC-SLP Admissions Coordinator [phone]

## 2024-06-25 NOTE — Progress Notes (Signed)
 SLP Cancellation Note  Patient Details Name: Gabriela Moyer MRN: [id number] DOB: 10-23-1941   Cancelled treatment:       Reason Eval/Treat Not Completed: Fatigue/lethargy limiting ability to participate. Pt sleeping soundly. Sister at bedside said that she is too out of it right now to try to eat or drink, but that when she is more alert, that she has been swallowing well on current diet. SLP will continue to follow.     Leita SAILOR., M.A. CCC-SLP Acute Rehabilitation Services Office: [phone]  Secure chat preferred  06/25/2024, 3:13 PM

## 2024-06-25 NOTE — Progress Notes (Signed)
  Progress Note   Patient: Gabriela Moyer FMW:[id number] DOB: [birth year] DOA: 06/06/2024     19 DOS: the patient was seen and examined on 06/25/2024 at 10:11AM      Brief hospital course: 83 y.o. F with HTN, PVD (RAS s/p renal artery stent in 2010, subclavian, axillary, coronary), hypothyroidism, pAF not on AC due to hx GIB and HLD who presented with headache, vision change and confusion.  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  Neurology consulted.  EEG showed focal seizures.  Mentation waxing and waning.  Underwent LP which was nondiagnostic, then open meningeal biopsy on 6/19.       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  Unfortunately, she is not a candidate for high dose chemotherapy and so we have recommended Hospice.  Family have indicated they may prefer home hospice. - Consult Palliative Care - Continue depakote  and Keppra    Acute metabolic encephalopathy - Continue Seroquel   Hypothyroidism - Continue levothyroxine   Atherosclerosis - Stop Lipitor, Zetia , Plavix   Paroxysmal atrial fibrillation - Continue metoprolol   Hypertension - Continue metoprolol  - Hold amlodipine  and HCTZ  Hypomagnesemia  Chronic kidney disease stage IIIa  Orthostatic hypotension With seems to be resolved         Subjective: Met with oncology yesterday.  Agitated again last night.  Sleepy today.     Physical Exam: BP 139/75 (BP Location: Right Arm)   Pulse 66   Temp 97.6 F (36.4 C) (Axillary)   Resp 16   Ht 5' 2 (1.575 m)   Wt 70.3 kg   SpO2 99%   BMI 28.35 kg/m   Elderly adult female, lying in bed, weak and sluggish, opens eyes sluggishly, does not respond to questions, falls back asleep RRR, no murmurs, no peripheral edema Respiratory rate normal, lungs diminished but no rales or wheezes appreciated Abdomen soft, no grimace to palpation Inattentive,  psychomotor slowing severe, generalized weakness    Data Reviewed: None new   Family Communication:      Disposition: Status is: Inpatient         Author: [name], MD 06/25/2024 3:53 PM  For on call review [email or web address].

## 2024-06-25 NOTE — Consult Note (Incomplete)
 Palliative Care Consult Note                                  Date: 06/25/2024   Patient Name: Gabriela Moyer  DOB: August 29, 1941  MRN: [id number]  Age / Sex: 83 y.o., female  PCP: [name], Charlie ORN, MD Referring Physician: Jonel Lonni SQUIBB, *  Reason for Consultation: Establishing goals of care  HPI/Patient Profile: Palliative Care consult requested for goals of care discussion in this 83 y.o. female  with past medical history of atrial fibrillation (no anticoagulation due to GIB), HLD, hypertension, PVD s/p renal artery stent (2010), hypothyroidisms, CKD 3. She was admitted on 06/06/2024 from home with severe headache x4 days wit hright-sided vertigo and visual changes. CT of head showed brain lesion. MRI showed leptomeningeal carcinomatosis. Patient seen by Neurology and Dr. 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   Past Medical History:  Diagnosis Date   Carotid arterial disease (HCC)    a. 08/2016 Carotid U/S: 1-39% bilat ICA stenosis, >50 R ECA stenosis.   Diastolic dysfunction    a. 11/2016 Echo: EF 65-70%, Gr1 DD.   History of stress test    a. 12/2016 Lexiscan  MV: EF 73%, no ischemia/infarct.   Hyperlipidemia    Hypertension    PAF (paroxysmal atrial fibrillation) (HCC)    a. 11/2016 AF RVR in ED-->converted on IV dilt-->CHA2DS2VASc = 4-->Xarelto .   Renal artery stenosis (HCC)    a. 06/2009 s/p R Renal Artery PTA/stenting; b. 08/2016 Renal Duplex: nl LRA, >60% RRA stenosis- f/u 1 yr.   Subclavian arterial stenosis (HCC)    a. 08/2014 Upper Ext Duplex: R distal [place] & Ax - 70-99% diam reduction, L distal [place] & Ax - 50-69%--stable.     Subjective:   This NP Levon Freud reviewed medical records, received report from team, assessed the patient and then met at the patient's bedside with Dr. Jonel and family (daughter, Leita and spouse, son, Beryl and spouse, and sister, Nena) to discuss  diagnosis, prognosis, GOC, EOL wishes disposition and options.  Patient is resting comfortable. No acute distress noted. Due to condition and altered mental state patient is not able to engage appropriately in discussions.    Concept of Palliative Care was introduced as specialized medical care for people and their families living with serious illness.  It focuses on providing relief from the symptoms and stress of a serious illness.  The goal is to improve quality of life for both the patient and the family. Values and goals of care important to patient and family were attempted to be elicited.  I created space and opportunity for family to explore state of health prior to admission, thoughts, and feelings.   Prior to admission patient with her dog.  Perform most ADLs independently.  She has 2 children who are involved in her care in addition to a close relationship with her sister Nena.  Family shares there close relationship as a family which patient values.  We discussed Her current illness and what it means in the larger context of Her on-going co-morbidities. Natural disease trajectory and expectations were discussed.  Family realistic in their understanding of patient's current illness and poor overall prognosis.  Over the past few weeks, she has experienced significant changes in her condition. Initially independent and managing her daily activities, she began experiencing severe migraines and vomiting. Also with some increased forgetfulness and confusion  with they contributed to older age. After patient remained in bed for several days family became concerned given this was not her usual behavior, leading to hospitalization. They are able to speak to their understanding of cancer diagnosis with brain involvement.   In the last few days, she has exhibited episodes of agitation and aggressive behavior, described as 'flipping out, swinging, kicking, carrying on,' particularly in the evenings,  which her family associates with 'sundowning versus disease response These episodes have been more violent recently. Since Thursday night, family feels she has been less responsive, and her family has had minimal contact with her verbally. Patient continues to show some interest in nutrition however requires assistance with feeds and when observed she keeps her eyes closed.   We discussed at length disease trajectory and expectations as patient becomes closer to end-of-life. Emotional support provided.   All family are in mutual agreement expressing clear goals to focus on [name] comfort  and eliminate any suffering. They are not interested in aggressive interventions. Hopeful that patient can return home with family as she has expressed this as her wishes multiple times over the past week. Specifically to be in the home with her dog.   I discussed the importance of continued conversation with family and their medical providers regarding overall plan of care and treatment options, ensuring decisions are within the context of the patients values and GOCs.  Questions and concerns were addressed. The family was encouraged to call with questions or concerns.  PMT will continue to support holistically as needed.   Objective:   Primary Diagnoses: Present on Admission:  Essential hypertension  Hyperlipidemia   Scheduled Meds:  atorvastatin   40 mg Oral Daily   vitamin B-12  1,000 mcg Oral Daily   divalproex   750 mg Oral Q12H   enoxaparin  (LOVENOX ) injection  40 mg Subcutaneous Q24H   famotidine   40 mg Oral Daily   levETIRAcetam   1,500 mg Intravenous Q12H   Or   levETIRAcetam   1,500 mg Oral Q12H   levothyroxine   50 mcg Oral Daily   metoprolol  succinate  25 mg Oral Daily   mouth rinse  15 mL Mouth Rinse 4 times per day   potassium chloride   20 mEq Oral Daily   QUEtiapine   25 mg Oral QHS    Continuous Infusions:  sodium chloride  Stopped (06/07/24 1418)   valproate sodium  Stopped  (06/20/24 2149)    PRN Meds: acetaminophen  **OR** acetaminophen  (TYLENOL ) oral liquid 160 mg/5 mL **OR** acetaminophen , diphenhydrAMINE , labetalol , ondansetron  **OR** ondansetron  (ZOFRAN ) IV, mouth rinse, promethazine , senna-docusate  Allergies  Allergen Reactions   Ezetimibe      Other Reaction(s): muscle aches, arm cramps   Codeine Nausea And Vomiting    Abdominal pain   Contrast Media [Iodinated Contrast Media] Nausea And Vomiting    Abdominal pain Caused low blood pressure   Morphine Nausea And Vomiting    Abdominal pain    Review of Systems  Unable to perform ROS: Acuity of condition    Physical Exam General: Somnolent, frail, weak-ill appearing Cardiovascular: regular rate and rhythm Pulmonary: diminished bilaterally  Abdomen: soft, nontender, + bowel sounds Extremities: no edema, no joint deformities Skin: no rashes, warm and dry Neurological: somnolent, does not open eyes, does not follow commands or respond to questions,   Vital Signs:  BP 100/72 (BP Location: Right Arm)   Pulse 60   Temp 97.6 F (36.4 C) (Axillary)   Resp 16   Ht 5' 2 (1.575 m)   Wt  70.3 kg   SpO2 96%   BMI 28.35 kg/m  Pain Scale: 0-10 POSS *See Group Information*: 1-Acceptable,Awake and alert Pain Score: 0-No pain  SpO2: SpO2: 96 % O2 Device:SpO2: 96 % O2 Flow Rate: .O2 Flow Rate (L/min): 2 L/min  IO: Intake/output summary:  Intake/Output Summary (Last 24 hours) at 06/25/2024 0834 Last data filed at 06/25/2024 0440 Gross per 24 hour  Intake --  Output 400 ml  Net -400 ml    LBM: Last BM Date : 06/23/24 Baseline Weight: Weight: 74 kg Most recent weight: Weight: 70.3 kg      Palliative Assessment/Data: PPS 20-30%   Advanced Care Planning:   Primary Decision Maker: NEXT OF KIN  Code Status/Advance Care Planning: DNR  A discussion was had today regarding advanced directives. Concepts specific to code status, artifical feeding and hydration, continued IV antibiotics and  rehospitalization was had.  The difference between a aggressive medical intervention path and a palliative comfort care path was discussed.  Family confirms wishes for care to continue focusing on her comfort for what time she has left.  DNR/DNI.  Hospice and Palliative Care services outpatient were explained and offered. Patient and family verbalized their understanding and awareness of both palliative and hospice's goals and philosophy of care.  Education provided on what care would look like in the home with hospice support versus inpatient hospice facility.  Dr. Jonel and I also discussed symptom management and signs of end-of-life including medication administration allowing family to best support patient needs in the home.  Family confirms wishes for patient to return home with hospice support.  They have specifically requested [facility] with comfort of knowing if patient was in need of inpatient hospice assistance their facility would be located in [place].  Education provided on referral process.  Assessment & Plan:   SUMMARY OF RECOMMENDATIONS   DNR/DNI-as confirmed by family.  Continue with current plan of care focusing solely on patient's comfort.  Minimizing symptoms aggressively with as needed medications. Family is clear and expressed wishes for patient to return home with outpatient hospice support for what time she has left.  Understands primary focus would be to keep patient in the home and contact hospice with any concerns postdischarge.  No rehospitalization.  They are realistic in their understanding of disease trajectory and poor prognosis. TOC referral for outpatient hospice.  Family specifically requests [facility].  PMT will continue to support and follow as needed. Please call team line or secure chat assigns palliative provider with urgent unmet palliative needs.  Symptom Management:  Per Attending  Palliative Prophylaxis:  Aspiration, Bowel Regimen, Delirium  Protocol, Frequent Pain Assessment, Oral Care, and Turn Reposition  Additional Recommendations (Limitations, Scope, Preferences): Full Comfort Care  Psycho-social/Spiritual:  Desire for further Chaplaincy support: no Additional Recommendations: Education on Hospice  Prognosis:  Days-weeks pending disease trajectory.  Discharge Planning:  Home with Hospice   Discussed with: Dr. Jonel, [facility], family.  Patient's family expressed understanding and was in agreement with this plan.    Time Total: 75 min  Visit consisted of counseling and education dealing with the complex and emotionally intense issues of symptom management and palliative care in the setting of serious and potentially life-threatening illness.  Signed by:  Levon Borer, AGPCNP-BC Palliative [facility]   Phone: [phone] Pager: [phone] Amion: GEANNIE Freud   Thank you for allowing the Palliative Medicine Team to assist in the care of this patient. Please utilize secure chat with additional questions, if there is  no response within 30 minutes please call the above phone number. Palliative Medicine Team providers are available by phone from 7am to 5pm daily and can be reached through the team cell phone.  Should this patient require assistance outside of these hours, please call the patient's attending physician.

## 2024-06-25 NOTE — Progress Notes (Signed)
     Referral received for Gabriela Moyer :goals of care discussion. Chart reviewed and updates received from RN. Patient assessed and is unable to engage appropriately in discussions. Sister, Gabriela Moyer is at the bedside. Request to contact patient's daughter, Gabriela for ongoing discussions.   I was able to speak with daughter, Gabriela Moyer GOC meeting scheduled for 06/26/24 @ 1130 am per family's request to allow all children to be present. Family is aware we will meet at the bedside.   Detailed note and recommendations to follow once GOC has been completed.   Thank you for your referral and allowing PMT to assist in Gabriela Moyer care.   Levon Borer, AGPCNP-BC Palliative Medicine Team  Phone: [phone] Pager: [phone] Amion: N. Cousar   NO CHARGE

## 2024-06-25 NOTE — Progress Notes (Signed)
 Patient is able to aroused easily with voice answers questions appropriately knows name and place knows she is at [facility], able to follow direction

## 2024-06-26 DIAGNOSIS — R519 Headache, unspecified: Secondary | ICD-10-CM

## 2024-06-26 DIAGNOSIS — I639 Cerebral infarction, unspecified: Secondary | ICD-10-CM | POA: Diagnosis not present

## 2024-06-26 DIAGNOSIS — Z7189 Other specified counseling: Secondary | ICD-10-CM | POA: Diagnosis not present

## 2024-06-26 DIAGNOSIS — Z515 Encounter for palliative care: Secondary | ICD-10-CM | POA: Diagnosis not present

## 2024-06-26 DIAGNOSIS — Z66 Do not resuscitate: Secondary | ICD-10-CM | POA: Diagnosis not present

## 2024-06-26 MED ORDER — MORPHINE SULFATE (CONCENTRATE) 10 MG /0.5 ML PO SOLN
20.0000 mg | ORAL | Status: DC | PRN
  Administered 2024-06-26: 20 mg via ORAL
  Filled 2024-06-26: qty 1

## 2024-06-26 NOTE — Progress Notes (Signed)
 Received message from Hospice and Palliative Care NP that patient's family wishes to speak with [facility].  RNCM contacted Hunter Seip, [facility] liaison, and she will speak with family.

## 2024-06-26 NOTE — Progress Notes (Signed)
 Inpatient Rehab Admissions Coordinator:  Family is no longer interested in aggressive interventions. Decided they would like pt to return home with hospice. AC will sign off.   Tinnie Yvone Cohens, MS, CCC-SLP Admissions Coordinator [phone]

## 2024-06-26 NOTE — Plan of Care (Signed)
  Problem: Nutrition: Goal: Risk of aspiration will decrease Outcome: Progressing Goal: Dietary intake will improve Outcome: Progressing   Problem: Clinical Measurements: Goal: Ability to maintain clinical measurements within normal limits will improve Outcome: Progressing Goal: Will remain free from infection Outcome: Progressing Goal: Diagnostic test results will improve Outcome: Progressing Goal: Respiratory complications will improve Outcome: Progressing Goal: Cardiovascular complication will be avoided Outcome: Progressing   Problem: Safety: Goal: Ability to remain free from injury will improve Outcome: Progressing   Problem: Skin Integrity: Goal: Risk for impaired skin integrity will decrease Outcome: Progressing   Problem: Pain Managment: Goal: General experience of comfort will improve and/or be controlled Outcome: Progressing

## 2024-06-26 NOTE — Progress Notes (Signed)
  Progress Note   Patient: Gabriela Moyer FMW:[id number] DOB: 04-Jan-1941 DOA: 06/06/2024     20 DOS: the patient was seen and examined on 06/26/2024 at 11:24AM      Brief hospital course: 83 y.o. 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  Ate most of dinner last night but had to be fed.  Slept overnight, no agitation.  No seizure, no fever.     Physical Exam: BP 91/76 (BP Location: Right Wrist)   Pulse 78   Temp 99.1 F (37.3 C) (Oral)   Resp 16   Ht 5' 2 (1.575 m)   Wt 70.3 kg   SpO2 96%   BMI 28.35 kg/m   Elderly female, lying in bed, sluggish, does not rouse well for me, opens eyes sluggishly, does not follow commands, then falls back aslpee RRR, no murmurs, no peripheral edema Respiratory rate normal, lungs diminished but no rales or wheezes appreciated Abdomen soft, no grimace to palpation    Data Reviewed: None new   Family Communication:  Son, daughter, DIL, SIL, sister    Disposition: Status is: Inpatient         Author: [name], MD 06/26/2024 3:14 PM  For on call review [email or web address].

## 2024-06-26 NOTE — Progress Notes (Signed)
 [facility] [phone] [facility]  Hospice hospital liaison note:  Received referral for hospice services at home vs IPU from TOC/PMT provider. Chart currently under review.   Talked with daughter and family members by phone to explain services and hospice philosophy of care. Family requesting time to discuss their options and how they would like to proceed.   Notified later by PMT provider that the family thinks they may like to proceed with IPU referral. At this time [facility] does not have any beds available, will plan to meet with family/assess patient tomorrow.   Thank you for the opportunity to participate in this patient's plan of care.  Hunter Seip, BSN, RN Hospice hospital liaison [phone]

## 2024-06-26 NOTE — Progress Notes (Signed)
 Family stated they have decided they would like to bring the pt home for hospice. The ssonest they would be able to do that would be Monday after noon. Dr. Jonel informed. Will pass on in report.

## 2024-06-26 NOTE — Plan of Care (Signed)
  Problem: Education: Goal: Knowledge of disease or condition will improve Outcome: Progressing Goal: Knowledge of secondary prevention will improve (MUST DOCUMENT ALL) Outcome: Progressing Goal: Knowledge of patient specific risk factors will improve (DELETE if not current risk factor) Outcome: Progressing   Problem: Ischemic Stroke/TIA Tissue Perfusion: Goal: Complications of ischemic stroke/TIA will be minimized Outcome: Progressing   Problem: Coping: Goal: Will verbalize positive feelings about self Outcome: Progressing Goal: Will identify appropriate support needs Outcome: Progressing   Problem: Health Behavior/Discharge Planning: Goal: Ability to manage health-related needs will improve Outcome: Progressing Goal: Goals will be collaboratively established with patient/family Outcome: Progressing   Problem: Self-Care: Goal: Ability to participate in self-care as condition permits will improve Outcome: Progressing Goal: Verbalization of feelings and concerns over difficulty with self-care will improve Outcome: Progressing Goal: Ability to communicate needs accurately will improve Outcome: Progressing   Problem: Nutrition: Goal: Risk of aspiration will decrease Outcome: Progressing Goal: Dietary intake will improve Outcome: Progressing   Problem: Education: Goal: Knowledge of General Education information will improve Description: Including pain rating scale, medication(s)/side effects and non-pharmacologic comfort measures Outcome: Progressing   Problem: Health Behavior/Discharge Planning: Goal: Ability to manage health-related needs will improve Outcome: Progressing   Problem: Clinical Measurements: Goal: Ability to maintain clinical measurements within normal limits will improve Outcome: Progressing Goal: Will remain free from infection Outcome: Progressing Goal: Diagnostic test results will improve Outcome: Progressing Goal: Respiratory complications will  improve Outcome: Progressing Goal: Cardiovascular complication will be avoided Outcome: Progressing   Problem: Nutrition: Goal: Adequate nutrition will be maintained Outcome: Progressing   Problem: Coping: Goal: Level of anxiety will decrease Outcome: Progressing   Problem: Elimination: Goal: Will not experience complications related to bowel motility Outcome: Progressing Goal: Will not experience complications related to urinary retention Outcome: Progressing   Problem: Education: Goal: Knowledge of the prescribed therapeutic regimen will improve Outcome: Progressing   Problem: Clinical Measurements: Goal: Usual level of consciousness will be regained or maintained. Outcome: Progressing Goal: Neurologic status will improve Outcome: Progressing Goal: Ability to maintain intracranial pressure will improve Outcome: Progressing   Problem: Safety: Goal: Non-violent Restraint(s) Outcome: Progressing

## 2024-06-27 DIAGNOSIS — I639 Cerebral infarction, unspecified: Secondary | ICD-10-CM | POA: Diagnosis not present

## 2024-06-27 MED ORDER — QUETIAPINE FUMARATE 25 MG PO TABS: 25.0000 mg | ORAL_TABLET | Freq: Every evening | ORAL | Status: DC | PRN

## 2024-06-27 MED ORDER — HALOPERIDOL LACTATE 5 MG/ML IJ SOLN: 2.0000 mg | Freq: Four times a day (QID) | INTRAMUSCULAR | Status: DC | PRN

## 2024-06-27 MED ORDER — ONDANSETRON HCL 4 MG/2ML IJ SOLN: 4.0000 mg | INTRAMUSCULAR | Status: AC | PRN

## 2024-06-27 MED ORDER — ORAL CARE MOUTH RINSE: 15.0000 mL | Freq: Four times a day (QID) | OROMUCOSAL | Status: AC | PRN

## 2024-06-27 MED ORDER — HALOPERIDOL LACTATE 5 MG/ML IJ SOLN: 2.0000 mg | Freq: Four times a day (QID) | INTRAMUSCULAR | Status: AC | PRN

## 2024-06-27 MED ORDER — ACETAMINOPHEN 650 MG RE SUPP: 650.0000 mg | RECTAL | Status: AC | PRN

## 2024-06-27 MED ORDER — HALOPERIDOL 0.5 MG PO TABS: 2.0000 mg | ORAL_TABLET | Freq: Four times a day (QID) | ORAL | Status: DC | PRN

## 2024-06-27 MED ORDER — ORAL CARE MOUTH RINSE: 15.0000 mL | OROMUCOSAL | Status: AC | PRN

## 2024-06-27 MED ORDER — LEVETIRACETAM (KEPPRA) 500 MG/5 ML ADULT IV PUSH: 1500.0000 mg | Freq: Two times a day (BID) | INTRAVENOUS | Status: AC

## 2024-06-27 MED ORDER — VALPROATE SODIUM 100 MG/ML IV SOLN: 750.0000 mg | Freq: Two times a day (BID) | INTRAVENOUS | Status: AC

## 2024-06-27 MED ORDER — MORPHINE SULFATE (CONCENTRATE) 10 MG /0.5 ML PO SOLN: 20.0000 mg | ORAL | Status: AC | PRN

## 2024-06-27 NOTE — Progress Notes (Signed)
 Report called to Annabella, Merchandiser, retail at [facility]. All questions answered.

## 2024-06-27 NOTE — Discharge Summary (Signed)
 Physician Discharge Summary   Patient: Gabriela Moyer MRN: [id number] DOB: 1941/11/02  Admit date:     06/06/2024  Discharge date: 06/27/24  Discharge Physician: [name]   PCP: [name], [name], MD     Recommendations at discharge:  Discharged to [facility]     Discharge Diagnoses: Principal Problem:   Secondary CNS lymphoma Active Problems:   Acute metabolic encephalopathy   Hypothyroidism   Atherosclerosis   Paroxysmal atrial fibrillation   Hypertension   Hypomagnesemia   Chronic kidney disease stage IIIa   Orthostatic hypotension   AKI ruled out        Hospital Course: 83 y.o. F with HTN, PVD (RAS s/p renal artery stent in 2010, subclavian, axillary, coronary), hypothyroidism, pAF not on AC due to hx GIB and HLD who presented with headache, vision change and confusion.   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  Neurology consulted.   EEG showed focal seizures.  Mentation waxing and waning.  Underwent LP which was nondiagnostic, then open meningeal biopsy on 6/19.     Biopsy showed secondary CNS lymphoma. Neuro-oncology consulted and evaluated patient. Therapy would require high dose chemotherapy, and the consensus by Oncology and medical team is that this would cause more harm than likelihood of recovery and so family elected for Hospice.    In the last 24 hours, the patient has become more unresponsive, has not been awake enough to take anything by mouth.  Recommend transition to residential hospice.  Continue IV anti-epileptics as able.  Morphine for pain, Haldol for agitation.            Disposition: Residential hospice  DISCHARGE MEDICATION: Allergies as of 06/27/2024       Reactions   Ezetimibe     Other Reaction(s): muscle aches, arm cramps   Codeine Nausea And Vomiting   Abdominal pain   Contrast Media [iodinated Contrast Media] Nausea And Vomiting   Abdominal pain Caused  low blood pressure   Morphine Nausea And Vomiting   Abdominal pain        Medication List     STOP taking these medications    amLODipine  5 MG tablet Commonly known as: NORVASC    clopidogrel  75 MG tablet Commonly known as: PLAVIX    ezetimibe  10 MG tablet Commonly known as: ZETIA    hydrochlorothiazide  25 MG tablet Commonly known as: HYDRODIURIL    levothyroxine  25 MCG tablet Commonly known as: SYNTHROID    metoprolol  succinate 100 MG 24 hr tablet Commonly known as: TOPROL -XL   metoprolol  tartrate 50 MG tablet Commonly known as: LOPRESSOR    pravastatin  40 MG tablet Commonly known as: PRAVACHOL    rosuvastatin 5 MG tablet Commonly known as: CRESTOR       TAKE these medications    acetaminophen  650 MG suppository Commonly known as: TYLENOL  Place 1 suppository (650 mg total) rectally every 4 (four) hours as needed for mild pain (pain score 1-3) or fever (or temp > 37.5 C (99.5 F)).   haloperidol lactate 5 MG/ML injection Commonly known as: HALDOL Inject 0.4 mLs (2 mg total) into the vein every 6 (six) hours as needed.   levETIRAcetam  500 MG/5ML undiluted injection Commonly known as: KEPPRA  Inject 15 mLs (1,500 mg total) into the vein every 12 (twelve) hours.   morphine CONCENTRATE 10 mg / 0.5 ml concentrated solution Take 1 mL (20 mg total) by mouth every 2 (two) hours as needed for severe pain (pain score 7-10).   mouth rinse  Liqd solution 15 mLs by Mouth Rinse route 4 (four) times daily as needed.   mouth rinse Liqd solution 15 mLs by Mouth Rinse route as needed (oral care).   ondansetron  4 MG/2ML Soln injection Commonly known as: ZOFRAN  Inject 2 mLs (4 mg total) into the vein every 4 (four) hours as needed for nausea or vomiting.   valproate 750 mg in dextrose  5 % 50 mL Inject 750 mg into the vein every 12 (twelve) hours.           Discharge Exam: Filed Weights   06/06/24 1431 06/17/24 0850  Weight: 74 kg 70.3 kg   General: Obtunded,  unresponsive Cardiovascular: HR high normal, no murmurs, no edema Respiratory: Slow and shallow, no rales Abdominal: Abdomen soft and non-tender.  No distension or HSM.      Condition at discharge: worsening  The results of significant diagnostics from this hospitalization (including imaging, microbiology, ancillary and laboratory) are listed below for reference.   Imaging Studies: EEG adult Result Date: 06/18/2024 Shelton Arlin KIDD, MD     06/18/2024  9:22 AM Patient Name: Gabriela Moyer MRN: [id number] Epilepsy Attending: Arlin KIDD Shelton Referring Physician/Provider: Khaliqdina, Salman, MD Date: [id number] Duration: 25.75mins  Patient history: 83 y.o. female with visual disturbance. EEG to evaluate for seizure.  Level of alertness: comatose/ lethargic  AEDs during EEG study: LEV, VPA  Technical aspects: This EEG study was done with scalp electrodes positioned according to the 10-20 International system of electrode placement. Electrical activity was reviewed with band pass filter of 1-70Hz , sensitivity of 7 uV/mm, display speed of 54mm/sec with a 60Hz  notched filter applied as appropriate. EEG data were recorded continuously and digitally stored.  Video monitoring was available and reviewed as appropriate. C3 channel was off during recording du to technical issues  Description:  EEG showed continuous generalized 3 to 5 Hz theta-delta slowing admixed with intermittent 13-15hz  beta activity. Hyperventilation and photic stimulation were not performed.    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 No seizures or epileptiform discharges were noted.  Priyanka KIDD Shelton   Overnight EEG with video Result Date: 06/14/2024 Shelton Arlin KIDD, MD     06/21/2024  8:21 AM Patient Name: Gabriela Moyer MRN: [id number] Epilepsy Attending: Arlin KIDD Shelton Referring Physician/Provider: Shelton Arlin KIDD, MD Duration: 06/13/2024 1634 to  06/14/2024 1047  Patient history: 83 y.o. female with visual disturbance. EEG to evaluate for seizure.  Level of alertness: awake/lethargic, asleep  AEDs during EEG study: LEV, VPA  Technical aspects: This EEG study was done with scalp electrodes positioned according to the 10-20 International system of electrode placement. Electrical activity was reviewed with band pass filter of 1-70Hz , sensitivity of 7 uV/mm, display speed of 58mm/sec with a 60Hz  notched filter applied as appropriate. EEG data were recorded continuously and digitally stored.  Video monitoring was available and reviewed as appropriate.  Description:  No clear posterior dominant rhythm was seen.  Sleep was characterized by vertex waves, sleep spindles (12 to 14 Hz), maximal frontocentral region. EEG showed continuous generalized 3 to 5 Hz theta-delta slowing. Generalized periodic discharges with triphasic morphology were noted at 1-2hz , prominently seen when awake/stimulation.  Hyperventilation and photic stimulation were not performed.    ABNORMALITY  - Periodic discharges with triphasic morphology, generalized - Continuous slow,  generalized  IMPRESSION: This study showed generalized periodic discharges with triphasic morphology, most likely secondary to toxic-metabolic causes. Additionally there was moderate  diffuse encephalopathy. No seizures were noted.  Arlin MALVA Krebs   EEG adult Result Date: 06/13/2024 Krebs Arlin MALVA, MD     06/14/2024  9:56 AM Patient Name: CARLTON SWEANEY MRN: [id number] Epilepsy Attending: Arlin MALVA Krebs Referring Physician/Provider: Perri DELENA Meliton Mickey., MD Date: 06/13/2024 Duration: 22.22 mins  Patient history: 84 y.o. female with visual disturbance. EEG to evaluate for seizure.  Level of alertness: comatose/ lethargic  AEDs during EEG study: LEV, VPA  Technical aspects: This EEG study was done with scalp electrodes positioned according to the 10-20 International system of electrode placement. Electrical activity  was reviewed with band pass filter of 1-70Hz , sensitivity of 7 uV/mm, display speed of 57mm/sec with a 60Hz  notched filter applied as appropriate. EEG data were recorded continuously and digitally stored.  Video monitoring was available and reviewed as appropriate.  Description:  EEG showed continuous generalized 3 to 5 Hz theta-delta slowing. Generalized periodic discharges with triphasic morphology were noted at 1-2hz .  Hyperventilation and photic stimulation were not performed.    ABNORMALITY  - Periodic discharges with triphasic morphology, generalized - Continuous slow,  generalized  IMPRESSION: This study showed generalized periodic discharges with triphasic morphology which could be secondary to toxic-metabolic causes. However, prior EEG did show seizures and the frequency reaches upto 2hz  which can be on the ictal-interictal continuum. Therefore, recommend long term eeg for further characterization. Additionally there was moderate diffuse encephalopathy. No definite seizures were noted.  Arlin MALVA Krebs   MR BRAIN W WO CONTRAST Result Date: 06/13/2024 CLINICAL DATA:  Follow-up examination four meningeal abnormality. EXAM: MRI HEAD WITHOUT AND WITH CONTRAST TECHNIQUE: Multiplanar, multiecho pulse sequences of the brain and surrounding structures were obtained without and with intravenous contrast. 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 No significant associated hemorrhage. Associated patchy post-contrast enhancement again seen within this region, now more parenchymal in appearance as compared to prior, likely related to an evolving infarct. No other new or interval areas of infarction. Additionally, there is patchy FLAIR signal intensity within the left  occipital lobe, increased from prior (series 6, image 17). No acute 5 heights suspected subtle patchy post-contrast enhancement (series 10, image 44). Findings are nonspecific. Area of leptomeningeal enhancement within the high posterior left frontoparietal region again seen, not significantly changed as compared to previous. No other new areas of abnormal enhancement. No other mass lesion, mass effect, or midline shift. No hydrocephalus or extra-axial fluid collection. Pituitary gland suprasellar region within normal limits. No other abnormal enhancement. Vascular: Major intracranial vascular flow voids are maintained. Skull and upper cervical spine: Craniocervical junction within normal limits. Bone marrow signal intensity normal. No significant scalp soft tissue abnormality. Sinuses/Orbits: Globes and orbital soft tissues within normal limits. Paranasal sinuses are largely clear. No mastoid effusion. Other: None. IMPRESSION: 1. 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 No associated hemorrhage or significant regional mass effect. 2. No significant interval change in appearance of leptomeningeal enhancement within the high posterior left frontoparietal region. Again, this finding is nonspecific, with primary differential considerations including changes of meningitis, leptomeningeal carcinomatosis, sarcoid, or possibly lymphoma. 3. Subtly increased patchy FLAIR signal abnormality with a few foci of enhancement involving the left occipital lobe, new as compared to recent MRI from 06/06/2024. Findings  are nonspecific, but could be related to recent seizure given provided history. Superimposed changes of PRES would be the primary differential consideration. Electronically Signed   By: Morene Hoard M.D.   On: 06/13/2024 04:10   CT CHEST ABDOMEN PELVIS W CONTRAST Result Date: 06/09/2024 CLINICAL DATA:  Known leptomeningeal disease,  evaluate for metastatic disease EXAM: CT CHEST, ABDOMEN, AND PELVIS WITH CONTRAST TECHNIQUE: Multidetector CT imaging of the chest, abdomen and pelvis was performed following the standard protocol during bolus administration of intravenous contrast. RADIATION DOSE REDUCTION: This exam was performed according to the departmental dose-optimization program which includes automated exposure control, adjustment of the mA and/or kV according to patient size and/or use of iterative reconstruction technique. CONTRAST:  75mL OMNIPAQUE  IOHEXOL  350 MG/ML SOLN COMPARISON:  None Available. FINDINGS: CT CHEST FINDINGS Cardiovascular: Atherosclerotic calcifications of the thoracic aorta are noted. No aneurysmal dilatation or dissection is seen. No cardiac enlargement is noted. Mild coronary calcifications are seen. The pulmonary artery as visualized shows no filling defect to suggest pulmonary embolism. Timing was not performed for embolus evaluation however. Mediastinum/Nodes: Thoracic inlet is within normal limits. No hilar or mediastinal adenopathy is noted. The esophagus as visualized is within normal limits. Lungs/Pleura: Lungs are well aerated bilaterally. A 1-2 mm right upper lobe nodule is noted laterally on image number 76 of series 6. No follow-up is recommended. No other nodules are seen. Minimal basilar atelectasis is seen. Musculoskeletal: No acute rib abnormality is noted. Degenerative changes of the thoracic spine are seen. CT ABDOMEN PELVIS FINDINGS Hepatobiliary: Gallbladder has been surgically removed. Mild biliary ductal dilatation is noted due to the post cholecystectomy state. Small cyst is noted within the left lobe of liver. Pancreas: Unremarkable. No pancreatic ductal dilatation or surrounding inflammatory changes. Spleen: Normal in size without focal abnormality. Adrenals/Urinary Tract: Adrenal glands are within normal limits. Kidneys demonstrate a normal enhancement pattern bilaterally. Multiple small  simple appearing cysts are noted. No follow-up is recommended. A stone is noted in the upper pole of the left kidney measuring up to 10 mm with associated scarring identified. No ureteral stones are seen. The bladder is within normal limits. Stomach/Bowel: Scattered diverticular change of the colon is noted without evidence of diverticulitis. No obstructive or inflammatory changes of the colon are seen. The appendix is not well visualized. No inflammatory changes to suggest appendicitis are noted. Small bowel and stomach are unremarkable. Vascular/Lymphatic: Aortic atherosclerosis. No enlarged abdominal or pelvic lymph nodes. Reproductive: Status post hysterectomy. No adnexal masses. Other: Minimal free fluid is noted within the pelvis of uncertain significance. Musculoskeletal: No acute or significant osseous findings. IMPRESSION: CT of the chest: Mild bibasilar atelectasis. Less than 6 mm right solid pulmonary nodule within the upper lobe. Per Fleischner Society Guidelines, if patient is low risk for malignancy, no routine follow-up imaging is recommended. If patient is high risk for malignancy, a non-contrast Chest CT at 12 months is optional. If performed and the nodule is stable at 12 months, no further follow-up is recommended. These guidelines do not apply to immunocompromised patients and patients with cancer. Follow up in patients with significant comorbidities as clinically warranted. For lung cancer screening, adhere to Lung-RADS guidelines. Reference: Radiology. 2017; 284(1):228-43. CT of the abdomen and pelvis: Diverticulosis without diverticulitis. Left renal stone in the upper pole measuring 10 mm with associated scarring. No other focal abnormality is noted. Electronically Signed   By: Oneil Devonshire M.D.   On: 06/09/2024 23:16   DG FL GUIDED LUMBAR PUNCTURE Result Date: 06/08/2024  CLINICAL DATA:  Provided history: Seizures. Request received for fluoroscopically-guided lumbar puncture. EXAM: LUMBAR  PUNCTURE UNDER FLUOROSCOPY PROCEDURE: The patient was positioned prone on the fluoroscopy table. An appropriate skin entry site was determined under fluoroscopy and marked. The operator donned sterile gloves and a mask. The lower back was prepped and draped in the usual sterile fashion. Local anesthesia was provided with 1% lidocaine . Under intermittent fluoroscopy, lumbar puncture was performed at the L2-L3, L3-L4 and L5-S1 levels using a 20 gauge spinal needle. However, there was no CSF return at these levels. An additional lumbar puncture attempt was offered to the patient, but the patient elected to terminate the procedure. The inner stylet was replaced within the needle and the needle was removed in its entirety. Dressings were applied to the skin entry sites. No immediate post-procedure complication was apparent. The procedure was performed by Carlin Griffon, PA-C, supervised by Dr. Rockey Childs. FLUOROSCOPY: Radiation Exposure Index (as provided by the fluoroscopic device): 28.8 mGy Kerma IMPRESSION: 1. Fluoroscopically-guided lumbar punctures performed at the L2-L3, L3-L4 and L5-S1 levels without CSF return. 2. No immediate post-procedure complication. Electronically Signed   By: Rockey Childs D.O.   On: 06/08/2024 14:28   Overnight EEG with video Result Date: 06/08/2024 Shelton Arlin KIDD, MD     06/09/2024  9:34 AM Patient Name: ELIANNA WINDOM MRN: [id number] Epilepsy Attending: Arlin KIDD Shelton Referring Physician/Provider: Shelton Arlin KIDD, MD Duration: 06/07/2024 1205 to 06/08/2024 1515 Patient history: 83 y.o. female with visual disturbance. EEG to evaluate for seizure.  Level of alertness: Awake, asleep  AEDs during EEG study: LEV, VPA  Technical aspects: This EEG study was done with scalp electrodes positioned according to the 10-20 International system of electrode placement. Electrical activity was reviewed with band pass filter of 1-70Hz , sensitivity of 7 uV/mm, display speed of 66mm/sec with a 60Hz   notched filter applied as appropriate. EEG data were recorded continuously and digitally stored.  Video monitoring was available and reviewed as appropriate.  Description: The posterior dominant rhythm consists of 8-9 Hz activity of moderate voltage (25-35 uV) seen predominantly in posterior head regions, asymmetric ( right<left) and reactive to eye opening and eye closing.  Sleep was characterized by vertex waves, sleep spindles (12-15Hz ), maximal frontocentral region.  EEG showed rhythmic sharply contoured 3 to 6 Hz theta-delta slowing in right>left occipital region   Hyperventilation and photic stimulation were not performed.    Frequent seizures without clinical signs were noted during the study.  During the seizure EEG showed 12 to 13 Hz sharply contoured beta activity in right occipital region which then involve left occipital region and evolved into high amplitude sharply contoured 5 to 6 Hz theta slowing followed by 2 to 3 Hz delta slowing.  At the beginning of the study, average 7-10 seizures were noted per hour lasting duration of seizures was about 1 minute each.  Gradually as medications were adjusted, after around 2200, seizure frequency improved.  Subsequently average 1 seizure was noted per hour lasting about 1-2 minutes each. After around 0900, seizure frequency worsened and eeg showed 3-4 seizures/hour lasting about 1 minute each. EEG was disconnected on 06/07/2024 between 1601 to 1911 for imaging.  ABNORMALITY - Focal seizure, right occipital region - Background asymmetry, right<left - Intermittent slow, right>left occipital region  IMPRESSION: This study showed focal seizures without clinical signs arising from right occipital region. At the beginning of the study, average 7-10 seizures were noted per hour lasting duration of seizures was about 1 minute each.  Gradually his medications were adjusted, after around 2200, seizure frequency improved.  Subsequently average 1 seizure was noted per hour  lasting about 1-2 minutes each.  After around 0900, seizure frequency worsened and eeg showed 3-4 seizures/hour lasting about 1 minute each.  Additionally there was evidence of cortical dysfunction in right > left occipital region likely secondary to underlying structural abnormality.  Dr. Lindzen And Dr Arora were intermittently notified.   Arlin MALVA Krebs    MR Lumbar Spine W Wo Contrast Result Date: 06/08/2024 CLINICAL DATA:  Initial evaluation for leptomeningeal disease. EXAM: MRI LUMBAR SPINE WITHOUT AND WITH CONTRAST TECHNIQUE: Multiplanar and multiecho pulse sequences of the lumbar spine were obtained without and with intravenous contrast. CONTRAST:  7mL GADAVIST  GADOBUTROL  1 MMOL/ML IV SOLN COMPARISON:  Brain MRI from 06/06/2024. FINDINGS: Segmentation: Examination somewhat degraded by motion artifact. Standard segmentation. Lowest well-formed disc space labeled the L5-S1 level. Alignment: Mild-to-moderate levoscoliosis. Trace degenerative retrolisthesis of L1 on L2. Vertebrae: Vertebral body height maintained without acute or chronic fracture. Bone marrow signal intensity mildly heterogeneous but overall within normal limits. No worrisome osseous lesions. Mild degenerative reactive endplate changes present about the L3-4 interspace. No evidence for osteomyelitis discitis or septic arthritis. Conus medullaris and cauda equina: Conus extends to the L1-2 level. Suspected subtle leptomeningeal enhancement about the distal cord (series 12, image 5 for example). Normal signal intensity within the distal cord and conus itself. No convincing involvement of the nerve roots of the cauda equina is seen. Paraspinal and other soft tissues: Paraspinous soft tissues demonstrate no acute finding. Multiple scattered cystic lesions noted about the visualized kidneys, a few of which within the right kidney are mildly complex in appearance. Disc levels: L1-2: Mild diffuse disc bulge with reactive endplate spurring. Mild  bilateral facet spurring. No spinal stenosis. Foramina remain patent. L2-3: Moderate degenerative intervertebral disc space narrowing with diffuse disc bulge. Reactive endplate change with marginal endplate osteophytic spurring. Mild bilateral facet hypertrophy. No significant spinal stenosis. Foramina remain adequately patent. L3-4: Degenerative intervertebral disc space narrowing with diffuse disc bulge in disc desiccation. Reactive endplate spurring. Moderate bilateral facet hypertrophy. No significant spinal stenosis. Mild bilateral L3 foraminal stenosis. L4-5: Degenerative intervertebral disc space narrowing with disc desiccation and mild diffuse disc bulge. Reactive endplate spurring. Mild to moderate bilateral facet hypertrophy. No significant spinal stenosis. Foramina remain patent. L5-S1: Negative interspace. Moderate bilateral facet arthrosis. No canal or foraminal stenosis. IMPRESSION: 1. Suspected subtle leptomeningeal enhancement about the distal cord. Finding is nonspecific, and could be either infectious or inflammatory in nature. Possible neoplastic process with leptomeningeal carcinomatosis could also be considered. 2. Underlying multilevel degenerative spondylosis and facet arthrosis as above. No significant spinal stenosis. Mild bilateral L3 foraminal narrowing. 3. Multiple cystic lesions about the visualized right kidney, a few which are mildly complex in appearance and incompletely evaluated on this exam. Further evaluation with dedicated renal mass protocol CT and/or MRI suggested for further evaluation. Electronically Signed   By: Morene Hoard M.D.   On: 06/08/2024 03:41   MR THORACIC SPINE W WO CONTRAST Result Date: 06/08/2024 CLINICAL DATA:  Initial evaluation for leptomeningeal disease. EXAM: MRI THORACIC WITHOUT AND WITH CONTRAST TECHNIQUE: Multiplanar and multiecho pulse sequences of the thoracic spine were obtained without and with intravenous contrast. CONTRAST:  7mL GADAVIST   GADOBUTROL  1 MMOL/ML IV SOLN COMPARISON:  Prior brain MRI from 06/06/2024. FINDINGS: Alignment: Mild dextroscoliosis. Alignment otherwise normal with preservation of the normal thoracic kyphosis. No significant listhesis. Vertebrae: Vertebral body height maintained without acute  or chronic fracture. Bone marrow signal intensity diffusely heterogeneous but overall within normal limits. Few scattered benign hemangiomata noted. No worrisome osseous lesions. Degenerative reactive endplate change with marrow edema present about the T1-2 and T8-9 interspaces. No evidence for osteomyelitis discitis or septic arthritis. Cord: Suspected subtle intermittent left a meningeal enhancement seen about the thoracic cord. Changes are most pronounced about the right dorsal surface of the lower cord at the level of T11-12 (series 29, images 20, 21). Probable additional subtle patchy involvement elsewhere as well. Normal signal and morphology within the cord itself. Paraspinal and other soft tissues: Paraspinous soft tissues demonstrate no acute finding. Trace layering bilateral pleural effusions. Multiple scattered cystic lesions noted within the visualized kidneys, a few of which are somewhat complex in appearance with T2 hypointensity. These are incompletely assessed on this exam. Disc levels: Ordinary for age multilevel disc bulging with reactive endplate spurring noted within the thoracic spine. No significant focal disc herniation. Mild multilevel facet degeneration. No significant spinal stenosis. Foramina remain patent. IMPRESSION: 1. Subtle intermittent leptomeningeal enhancement about the thoracic cord, most pronounced about the right dorsal surface of the lower cord at the level of T11-12. Findings are again nonspecific, and could be either infectious or inflammatory in nature. Possible neoplastic process not excluded. 2. Underlying ordinary for age thoracic spondylosis without significant spinal stenosis. 3. Multiple  scattered cystic lesions within the visualized kidneys, a few of which are mildly complex in appearance. These are incompletely assessed on this exam. Further evaluation with dedicated renal mass protocol CT and/or MRI suggested for further evaluation. Electronically Signed   By: Morene Hoard M.D.   On: 06/08/2024 03:30   MR CERVICAL SPINE W WO CONTRAST Result Date: 06/08/2024 CLINICAL DATA:  Follow-up examination for leptomeningeal disease. EXAM: MRI CERVICAL SPINE WITHOUT AND WITH CONTRAST TECHNIQUE: Multiplanar and multiecho pulse sequences of the cervical spine, to include the craniocervical junction and cervicothoracic junction, were obtained without and with intravenous contrast. CONTRAST:  7mL GADAVIST  GADOBUTROL  1 MMOL/ML IV SOLN COMPARISON:  Comparison made with brain MRI from 06/06/2024. FINDINGS: Alignment: Straightening with mild reversal of the normal cervical lordosis. No significant listhesis. Vertebrae: Vertebral body height maintained without acute or chronic fracture. Bone marrow signal intensity diffusely heterogeneous but overall within normal limits. No worrisome osseous lesions. No evidence for osteomyelitis discitis or septic arthritis. Cord: Suspected intermittent subtle leptomeningeal enhancement about the cervical cord, most pronounced along the ventral surface of the upper cervical cord at C1 through C4 (series 7, image 8). Normal signal seen within the cord itself. No other abnormal enhancement. Posterior Fossa, vertebral arteries, paraspinal tissues: Craniocervical junction within normal limits. Paraspinous soft tissues within normal limits. Normal flow voids seen within the vertebral arteries bilaterally. Disc levels: C2-C3: Disc bulge with left-sided uncovertebral spurring. Left-sided facet hypertrophy. No spinal stenosis. Moderate left C3 foraminal narrowing. Right neural foramina remains patent. C3-C4: Disc bulge with bilateral uncovertebral spurring. Right worse than left  facet hypertrophy with small joint effusion on the right. No significant spinal stenosis. Moderate to severe bilateral C4 foraminal narrowing. C4-C5: Degenerate intervertebral disc space narrowing. Central to right paracentral disc osteophyte complex indents the ventral thecal sac. Mild cord flattening without cord signal changes. Mild spinal stenosis. Uncovertebral spurring with resultant moderate right and mild left C5 foraminal stenosis. C5-C6: Ad C5 and C6 vertebral bodies are ankylosed, likely postoperative in nature. Endplate spurring indents the ventral thecal sac with residual mild spinal stenosis. Foramina appear patent. C6-C7: Degenerative intervertebral disc space narrowing with diffuse  disc osteophyte complex. Flattening and partial effacement of the ventral thecal sac, worse on the right. Mild spinal stenosis. Foramina remain patent. C7-T1: Mild disc bulge with uncovertebral spurring. Moderate right facet hypertrophy. No spinal stenosis. Foramina remain patent. IMPRESSION: 1. Suspected subtle leptomeningeal enhancement about the cervical cord as above. Findings are nonspecific, and could be either infectious or inflammatory in nature. Possible metastatic disease could also be considered given the corresponding findings on prior brain MRI. 2. 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 3. Multifactorial degenerative changes with resultant multilevel foraminal narrowing as above. Notable findings include moderate left C3 foraminal stenosis, moderate to severe bilateral C4 foraminal narrowing, with moderate right C5 foraminal stenosis. Electronically Signed   By: Morene Hoard M.D.   On: 06/08/2024 03:18   MR ANGIO HEAD WO CONTRAST Result Date: 06/07/2024 CLINICAL DATA:  Initial evaluation for acute CVA. EXAM: MRA NECK WITHOUT AND WITH CONTRAST MRA HEAD WITHOUT AND WITH CONTRAST TECHNIQUE: Multiplanar and multiecho pulse sequences of the neck were  obtained without and with intravenous contrast. Angiographic images of the neck were obtained using MRA technique without and with intravenous contast.; Angiographic images of the Circle of Willis were obtained using MRA technique without and with intravenous contrast. CONTRAST:  7mL GADAVIST  GADOBUTROL  1 MMOL/ML IV SOLN COMPARISON:  None Available. FINDINGS: MRA NECK FINDINGS Aortic arch: Examination technically limited by timing of the contrast bolus and motion artifact. Visualized aortic arch within normal limits for caliber with standard branch pattern. No visible stenosis or other abnormality about the origin the great vessels. Right carotid system: Right common and internal carotid arteries are patent with antegrade flow. No evidence for dissection. Mild atheromatous irregularity about the right carotid bulb without hemodynamically significant greater than 50% stenosis. Left carotid system: Left common and internal carotid arteries are patent with antegrade flow. No evidence for dissection. Minor atheromatous irregularity about the left carotid bulb without hemodynamically significant greater than 50% stenosis. Vertebral arteries: Both vertebral arteries arise from subclavian arteries. Vertebral arteries are patent with antegrade flow. No evidence for dissection. No hemodynamically significant stenosis. Other: None. MRA HEAD FINDINGS Anterior circulation: Both internal carotid arteries are patent through the siphons without significant stenosis or other abnormality. A1 segments patent bilaterally. Normal anterior communicating artery complex. Anterior cerebral arteries patent without significant stenosis. No M1 stenosis or occlusion. Distal MCA branches perfused and symmetric. Posterior circulation: Both V4 segments patent without significant stenosis. Both PICA patent at their origins. Basilar patent without significant stenosis. Superior cerebral arteries patent bilaterally. Both PCAs primarily supplied via  the basilar. PCAs are patent to their distal aspects without significant stenosis. Anatomic variants: None significant. Other: No intracranial aneurysm or other vascular malformation. IMPRESSION: MRA HEAD: Negative intracranial MRA for large vessel occlusion or other emergent finding. No hemodynamically significant or correctable stenosis. MRA NECK: 1. Negative MRA of the neck for large vessel occlusion or other emergent finding. 2. Mild for age atheromatous irregularity about the carotid bifurcations without hemodynamically significant stenosis. Electronically Signed   By: Morene Hoard M.D.   On: 06/07/2024 20:49   MR ANGIO NECK W WO CONTRAST Result Date: 06/07/2024 CLINICAL DATA:  Initial evaluation for acute CVA. EXAM: MRA NECK WITHOUT AND WITH CONTRAST MRA HEAD WITHOUT AND WITH CONTRAST TECHNIQUE: Multiplanar and multiecho pulse sequences of the neck were obtained without and with intravenous contrast. Angiographic images of the neck were obtained using MRA technique without and with intravenous contast.; Angiographic images of the Circle of Willis  were obtained using MRA technique without and with intravenous contrast. CONTRAST:  7mL GADAVIST  GADOBUTROL  1 MMOL/ML IV SOLN COMPARISON:  None Available. FINDINGS: MRA NECK FINDINGS Aortic arch: Examination technically limited by timing of the contrast bolus and motion artifact. Visualized aortic arch within normal limits for caliber with standard branch pattern. No visible stenosis or other abnormality about the origin the great vessels. Right carotid system: Right common and internal carotid arteries are patent with antegrade flow. No evidence for dissection. Mild atheromatous irregularity about the right carotid bulb without hemodynamically significant greater than 50% stenosis. Left carotid system: Left common and internal carotid arteries are patent with antegrade flow. No evidence for dissection. Minor atheromatous irregularity about the left carotid  bulb without hemodynamically significant greater than 50% stenosis. Vertebral arteries: Both vertebral arteries arise from subclavian arteries. Vertebral arteries are patent with antegrade flow. No evidence for dissection. No hemodynamically significant stenosis. Other: None. MRA HEAD FINDINGS Anterior circulation: Both internal carotid arteries are patent through the siphons without significant stenosis or other abnormality. A1 segments patent bilaterally. Normal anterior communicating artery complex. Anterior cerebral arteries patent without significant stenosis. No M1 stenosis or occlusion. Distal MCA branches perfused and symmetric. Posterior circulation: Both V4 segments patent without significant stenosis. Both PICA patent at their origins. Basilar patent without significant stenosis. Superior cerebral arteries patent bilaterally. Both PCAs primarily supplied via the basilar. PCAs are patent to their distal aspects without significant stenosis. Anatomic variants: None significant. Other: No intracranial aneurysm or other vascular malformation. IMPRESSION: MRA HEAD: Negative intracranial MRA for large vessel occlusion or other emergent finding. No hemodynamically significant or correctable stenosis. MRA NECK: 1. Negative MRA of the neck for large vessel occlusion or other emergent finding. 2. Mild for age atheromatous irregularity about the carotid bifurcations without hemodynamically significant stenosis. Electronically Signed   By: Morene Hoard M.D.   On: 06/07/2024 20:49   ECHOCARDIOGRAM COMPLETE Result Date: 06/07/2024    ECHOCARDIOGRAM REPORT   Patient Name:   CELESTINE PRIM Quintanilla Date of Exam: 06/07/2024 Medical Rec #:  [id number]      Height:       62.0 in Accession #:    [id number]     Weight:       163.1 lb Date of Birth:  1941/09/06      BSA:          1.753 m Patient Age:    83 years       BP:           104/68 mmHg Patient Gender: F              HR:           62 bpm. Exam Location:  Inpatient  Procedure: 2D Echo, Cardiac Doppler, Color Doppler and Saline Contrast Bubble            Study (Both Spectral and Color Flow Doppler were utilized during            procedure). Indications:    Stroke I63.9  History:        Patient has prior history of Echocardiogram examinations, most                 recent 12/18/2016.  Sonographer:    Tinnie Gosling RDCS Referring Phys: [phone] [name] IMPRESSIONS  1. Left ventricular ejection fraction, by estimation, is 55 to 60%. The left ventricle has normal function. The left ventricle has no regional wall motion abnormalities. Left ventricular diastolic parameters are consistent  with Grade II diastolic dysfunction (pseudonormalization).  2. Right ventricular systolic function is normal. The right ventricular size is normal. Tricuspid regurgitation signal is inadequate for assessing PA pressure.  3. The mitral valve is normal in structure. No evidence of mitral valve regurgitation. No evidence of mitral stenosis.  4. The aortic valve is tricuspid. There is mild calcification of the aortic valve. Aortic valve regurgitation is mild. No aortic stenosis is present.  5. The inferior vena cava is normal in size with greater than 50% respiratory variability, suggesting right atrial pressure of 3 mmHg.  6. Agitated saline contrast bubble study was negative, with no evidence of any interatrial shunt. FINDINGS  Left Ventricle: Left ventricular ejection fraction, by estimation, is 55 to 60%. The left ventricle has normal function. The left ventricle has no regional wall motion abnormalities. The left ventricular internal cavity size was normal in size. There is  no left ventricular hypertrophy. Left ventricular diastolic parameters are consistent with Grade II diastolic dysfunction (pseudonormalization). Right Ventricle: The right ventricular size is normal. No increase in right ventricular wall thickness. Right ventricular systolic function is normal. Tricuspid regurgitation signal  is inadequate for assessing PA pressure. Left Atrium: Left atrial size was normal in size. Right Atrium: Right atrial size was normal in size. Pericardium: There is no evidence of pericardial effusion. Mitral Valve: The mitral valve is normal in structure. Mild mitral annular calcification. No evidence of mitral valve regurgitation. No evidence of mitral valve stenosis. Tricuspid Valve: The tricuspid valve is normal in structure. Tricuspid valve regurgitation is trivial. No evidence of tricuspid stenosis. Aortic Valve: The aortic valve is tricuspid. There is mild calcification of the aortic valve. Aortic valve regurgitation is mild. No aortic stenosis is present. Pulmonic Valve: The pulmonic valve was normal in structure. Pulmonic valve regurgitation is not visualized. No evidence of pulmonic stenosis. Aorta: The aortic root is normal in size and structure. Venous: The inferior vena cava is normal in size with greater than 50% respiratory variability, suggesting right atrial pressure of 3 mmHg. IAS/Shunts: No atrial level shunt detected by color flow Doppler. Agitated saline contrast was given intravenously to evaluate for intracardiac shunting. Agitated saline contrast bubble study was negative, with no evidence of any interatrial shunt.  LEFT VENTRICLE PLAX 2D LVIDd:         3.40 cm   Diastology LVIDs:         2.40 cm   LV e' medial:    3.70 cm/s LV PW:         1.20 cm   LV E/e' medial:  21.6 LV IVS:        1.20 cm   LV e' lateral:   4.46 cm/s LVOT diam:     1.70 cm   LV E/e' lateral: 17.9 LV SV:         75 LV SV Index:   43 LVOT Area:     2.27 cm  RIGHT VENTRICLE         IVC TAPSE (M-mode): 2.2 cm  IVC diam: 1.80 cm LEFT ATRIUM             Index        RIGHT ATRIUM          Index LA diam:        3.00 cm 1.71 cm/m   RA Area:     9.57 cm LA Vol (A2C):   40.4 ml 23.04 ml/m  RA Volume:   17.30 ml 9.87 ml/m LA Vol (  A4C):   49.9 ml 28.46 ml/m LA Biplane Vol: 48.0 ml 27.38 ml/m  AORTIC VALVE LVOT Vmax:   124.00  cm/s LVOT Vmean:  89.900 cm/s LVOT VTI:    0.332 m  AORTA Ao Root diam: 2.40 cm Ao Asc diam:  3.20 cm MITRAL VALVE MV Area (PHT): 2.71 cm     SHUNTS MV Decel Time: 280 msec     Systemic VTI:  0.33 m MV E velocity: 79.90 cm/s   Systemic Diam: 1.70 cm MV A velocity: 134.00 cm/s MV E/A ratio:  0.60 Morene Brownie Electronically signed by Morene Brownie Signature Date/Time: 06/07/2024/2:00:24 PM    Final    EEG adult Result Date: 06/07/2024 Shelton Arlin KIDD, MD     06/07/2024 10:17 AM Patient Name: ANE CONERLY MRN: [id number] Epilepsy Attending: Arlin KIDD Shelton Referring Physician/Provider: Vanessa Robert, MD Date: 06/07/2024 Duration: 22.30 mins Patient history: 83 y.o. female with visual disturbance. EEG to evaluate for seizure. Level of alertness: Awake AEDs during EEG study: None Technical aspects: This EEG study was done with scalp electrodes positioned according to the 10-20 International system of electrode placement. Electrical activity was reviewed with band pass filter of 1-70Hz , sensitivity of 7 uV/mm, display speed of 27mm/sec with a 60Hz  notched filter applied as appropriate. EEG data were recorded continuously and digitally stored.  Video monitoring was available and reviewed as appropriate. Description: The posterior dominant rhythm consists of 8-9 Hz activity of moderate voltage (25-35 uV) seen predominantly in posterior head regions, asymmetric ( right<left) and reactive to eye opening and eye closing. Hyperventilation and photic stimulation were not performed.   2 seizures were noted during the study at 0825 and 0833.  During the seizure, no clinical signs were noted.  EEG showed 12 to 13 Hz sharply contoured beta activity in right occipital region which then involve left occipital region and evolved into high amplitude sharply contoured 5 to 6 Hz theta slowing followed by 2 to 3 Hz delta slowing.  Duration of seizures was about 1 minute 10 seconds each ABNORMALITY - Focal seizure, right  occipital region - Background asymmetry, right<left IMPRESSION: This study showed 2 focal seizures without clinical signs arising from right occipital region at 0825 and 0833 lasting for about 1 minute 10 seconds each. Additionally there was evidence of cortical dysfunction in right occipital region likely secondary to underlying structural abnormality. Dr. Lindzen was notified. Priyanka O Yadav   MR Brain W and Wo Contrast Result Date: 06/06/2024 CLINICAL DATA:  Headache, new onset. Concern for optic neuritis, suspected binocular vision loss. History of complex migraines. EXAM: MRI HEAD AND ORBITS WITHOUT AND WITH CONTRAST TECHNIQUE: Multiplanar, multiecho pulse sequences of the brain and surrounding structures were obtained without and with intravenous contrast. Multiplanar, multiecho pulse sequences of the orbits and surrounding structures were obtained including fat saturation techniques, before and after intravenous contrast administration. CONTRAST:  7mL GADAVIST  GADOBUTROL  1 MMOL/ML IV SOLN COMPARISON:  Earlier same day CT head. FINDINGS: MRI HEAD FINDINGS Brain: There is restricted diffusion involving the cortex and subcortical white matter in the right occipital lobe with mild associated edema and sulcal effacement. There is leptomeningeal/pial-arachnoid enhancement within this region. Additional areas of enhancement along the sulci in the posterior left frontal lobe and high left parietal lobe corresponding to areas of abnormality on same day CT concerning for additional leptomeningeal/pial-arachnoid enhancement. There is abnormal sulcal FLAIR hyperintensity in this region without evidence of parenchymal edema or enhancement. Additional diffuse dural enhancement noted. T2/FLAIR hyperintensity in the  periventricular and subcortical white matter. No evidence of intracranial hemorrhage. No midline shift. The basilar cisterns are patent. No extra-axial fluid collections. Ventricles: Normal size and  configuration of the ventricles. Mildly T1 hypointense focus along the ependymal surface of the right lateral ventricle with signal similar to cortical gray matter which may reflect focus of heterotopic gray matter. No associated enhancement. Vascular: Skull base flow voids are visualized. Skull and upper cervical spine: No focal abnormality. Other: Mastoid air cells are clear. MRI ORBITS FINDINGS Orbits: No traumatic or inflammatory finding. Globes, optic nerves, orbital fat, extraocular muscles, vascular structures, and lacrimal glands are normal. There is no edema or enlargement of the optic nerves or abnormal enhancement to suggest optic neuritis. Visualized sinuses: Clear. Soft tissues: Negative. IMPRESSION: Leptomeningeal/pial-arachnoid enhancement in the posterior left frontal lobe and high left parietal lobe corresponding to area of abnormality on CT. No definite underlying parenchymal enhancement or edema. 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 Findings most concerning for meningitis and possible focus of encephalitis in the right occipital lobe. Recommend lumbar puncture for further evaluation. Differential could also include leptomeningeal carcinomatosis. Complex migraine and associated vasospasm could potentially lead to infarct although the degree of enhancement especially in the frontoparietal lobes is atypical. Diffuse dural enhancement noted. Nodule along the surface of the right lateral ventricle with signal intensity similar to cortical gray matter without associated enhancement, suggestive of gray matter heterotopia. Unremarkable MRI of the orbits.  No findings of optic neuritis. Electronically Signed   By: Donnice Mania M.D.   On: 06/06/2024 17:44   MR ORBITS W WO CONTRAST Result Date: 06/06/2024 CLINICAL DATA:  Headache, new onset. Concern for optic neuritis, suspected binocular vision loss. History of complex  migraines. EXAM: MRI HEAD AND ORBITS WITHOUT AND WITH CONTRAST TECHNIQUE: Multiplanar, multiecho pulse sequences of the brain and surrounding structures were obtained without and with intravenous contrast. Multiplanar, multiecho pulse sequences of the orbits and surrounding structures were obtained including fat saturation techniques, before and after intravenous contrast administration. CONTRAST:  7mL GADAVIST  GADOBUTROL  1 MMOL/ML IV SOLN COMPARISON:  Earlier same day CT head. FINDINGS: MRI HEAD FINDINGS Brain: There is restricted diffusion involving the cortex and subcortical white matter in the right occipital lobe with mild associated edema and sulcal effacement. There is leptomeningeal/pial-arachnoid enhancement within this region. Additional areas of enhancement along the sulci in the posterior left frontal lobe and high left parietal lobe corresponding to areas of abnormality on same day CT concerning for additional leptomeningeal/pial-arachnoid enhancement. There is abnormal sulcal FLAIR hyperintensity in this region without evidence of parenchymal edema or enhancement. Additional diffuse dural enhancement noted. T2/FLAIR hyperintensity in the periventricular and subcortical white matter. No evidence of intracranial hemorrhage. No midline shift. The basilar cisterns are patent. No extra-axial fluid collections. Ventricles: Normal size and configuration of the ventricles. Mildly T1 hypointense focus along the ependymal surface of the right lateral ventricle with signal similar to cortical gray matter which may reflect focus of heterotopic gray matter. No associated enhancement. Vascular: Skull base flow voids are visualized. Skull and upper cervical spine: No focal abnormality. Other: Mastoid air cells are clear. MRI ORBITS FINDINGS Orbits: No traumatic or inflammatory finding. Globes, optic nerves, orbital fat, extraocular muscles, vascular structures, and lacrimal glands are normal. There is no edema or  enlargement of the optic nerves or abnormal enhancement to suggest optic neuritis. Visualized sinuses: Clear. Soft tissues: Negative. IMPRESSION: Leptomeningeal/pial-arachnoid enhancement in the posterior left frontal lobe and high left  parietal lobe corresponding to area of abnormality on CT. No definite underlying parenchymal enhancement or edema. 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 Findings most concerning for meningitis and possible focus of encephalitis in the right occipital lobe. Recommend lumbar puncture for further evaluation. Differential could also include leptomeningeal carcinomatosis. Complex migraine and associated vasospasm could potentially lead to infarct although the degree of enhancement especially in the frontoparietal lobes is atypical. Diffuse dural enhancement noted. Nodule along the surface of the right lateral ventricle with signal intensity similar to cortical gray matter without associated enhancement, suggestive of gray matter heterotopia. Unremarkable MRI of the orbits.  No findings of optic neuritis. Electronically Signed   By: Donnice Mania M.D.   On: 06/06/2024 17:44   CT Head Wo Contrast Result Date: 06/06/2024 CLINICAL DATA:  Headache EXAM: CT HEAD WITHOUT CONTRAST TECHNIQUE: Contiguous axial images were obtained from the base of the skull through the vertex without intravenous contrast. RADIATION DOSE REDUCTION: This exam was performed according to the departmental dose-optimization program which includes automated exposure control, adjustment of the mA and/or kV according to patient size and/or use of iterative reconstruction technique. COMPARISON:  06/19/2019 FINDINGS: Brain: Areas of high density in left frontal region. It is difficult to determine if this is subarachnoid hemorrhage or within the cortex. Low-density area in the right occipital lobe. This could reflect infarct, or edema related to  an underlying lesion. Small high-density 6 mm area adjacent to the right lateral ventricle concerning for brain lesion. No hydrocephalus. Vascular: No hyperdense vessel or unexpected calcification. Skull: No acute calvarial abnormality. Sinuses/Orbits: No acute findings Other: None IMPRESSION: Amorphous high-density areas noted in the left frontal region along the cortex and sulci. It is difficult to determine if this is subarachnoid hemorrhage or within the cortex. 6 mm small hyperdense area adjacent to the right lateral ventricle concerning for small brain lesion. Low-density area in the right occipital lobe could reflect infarct or edema related to underlying lesion. Recommend MRI without and with contrast for further evaluation. Electronically Signed   By: Franky Crease M.D.   On: 06/06/2024 16:15    Microbiology: Results for orders placed or performed during the hospital encounter of 06/06/24  Acid Fast Smear (AFB)     Status: None   Collection Time: 06/12/24  9:36 AM   Specimen: CSF  Result Value Ref Range Status   AFB Specimen Processing Concentration  Final   Acid Fast Smear Negative  Final    Comment: (NOTE) Performed At: [facility] [address] [place], [place] [id number] Jennette Shorter MD Ey:[id number]    Source (AFB) CSF  Final    Comment: Performed at [facility], [address] [place], [place] [address]  CSF culture w Gram Stain     Status: None   Collection Time: 06/12/24  1:10 PM   Specimen: CSF; Cerebrospinal Fluid  Result Value Ref Range Status   Specimen Description CSF  Final   Special Requests NONE  Final   Gram Stain WBC SEEN NO ORGANISMS SEEN CYTOSPIN SMEAR   Final   Culture   Final    NO GROWTH 3 DAYS Performed at [facility], [address], [place], [place] [address]    Report Status 06/15/2024 FINAL  Final  MT-RIF NAA W Cult, Non-Sputum     Status: None (Preliminary result)   Collection Time: 06/12/24  1:10 PM   Specimen: CSF;  Cerebrospinal Fluid  Result Value  Ref Range Status   Source CSF  Final    Comment: Performed at [facility], [address], [place], [place] [address]   Specimen Source Comment  Final    Comment: Cerebrospinal fluid (CSF)   M tuberculosis complex NOT DETECTED NOT DETECTED Final    Comment: (NOTE) This test was developed and its performance characteristics determined by [facility]. It has not been cleared or approved by the Food and Drug Administration.    Rifampin Not applicable NOT DETECTED Final    Comment: (NOTE) This test was developed and its performance characteristics determined by [facility]. It has not been cleared or approved by the Food and Drug Administration.    AFB Specimen Processing Concentration  Final    Comment: (NOTE) Performed At: [facility] [address] [place], [place] [id number] Jennette Shorter MD Ey:[id number]    Acid Fast Culture PENDING  Incomplete  Urine Culture (for pregnant, neutropenic or urologic patients or patients with an indwelling urinary catheter)     Status: Abnormal   Collection Time: 06/13/24  5:35 PM   Specimen: Urine, Clean Catch  Result Value Ref Range Status   Specimen Description URINE, CLEAN CATCH  Final   Special Requests   Final    NONE Performed at [facility], [address], [place], [place] [address]    Culture >=100,000 COLONIES/mL KLEBSIELLA PNEUMONIAE (A)  Final   Report Status 06/15/2024 FINAL  Final   Organism ID, Bacteria KLEBSIELLA PNEUMONIAE (A)  Final      Susceptibility   Klebsiella pneumoniae - MIC*    AMPICILLIN >=32 RESISTANT Resistant     CEFAZOLIN  <=4 SENSITIVE Sensitive     CEFEPIME <=0.12 SENSITIVE Sensitive     CEFTRIAXONE  <=0.25 SENSITIVE Sensitive     CIPROFLOXACIN <=0.25 SENSITIVE Sensitive     GENTAMICIN <=1 SENSITIVE Sensitive     IMIPENEM 1 SENSITIVE Sensitive     NITROFURANTOIN 64 INTERMEDIATE Intermediate     TRIMETH/SULFA <=20 SENSITIVE Sensitive     AMPICILLIN/SULBACTAM  16 INTERMEDIATE Intermediate     PIP/TAZO 8 SENSITIVE Sensitive ug/mL    * >=100,000 COLONIES/mL KLEBSIELLA PNEUMONIAE  Aerobic/Anaerobic Culture w Gram Stain (surgical/deep wound)     Status: None   Collection Time: 06/17/24 12:33 PM   Specimen: PATH Brain biopsy; Tissue  Result Value Ref Range Status   Specimen Description BIOPSY  Final   Special Requests LEFT BRAIN  Final   Gram Stain NO WBC SEEN NO ORGANISMS SEEN   Final   Culture   Final    No growth aerobically or anaerobically. Performed at [facility], [address], [place], [place] [address]    Report Status 06/22/2024 FINAL  Final    Labs: CBC: Recent Labs  Lab 06/21/24 0446 06/23/24 0429 06/24/24 0452  WBC 12.7* 10.3 10.2  HGB 12.5 14.2 14.7  HCT 38.5 43.5 44.6  MCV 93.2 91.8 91.0  PLT 261 258 241   Basic Metabolic Panel: Recent Labs  Lab 06/21/24 0446 06/23/24 0429 06/24/24 0452  NA 140 138 139  K 4.7 3.9 4.1  CL 107 100 104  CO2 25 27 28   GLUCOSE 120* 95 110*  BUN 27* 20 15  CREATININE 1.03* 0.94 0.96  CALCIUM  9.1 9.5 9.4  MG 1.8 1.5*  --   PHOS  --  2.5  --    Liver Function Tests: Recent Labs  Lab 06/21/24 0446 06/23/24 0429 06/24/24 0452  AST 33 35 34  ALT 21  20 20  ALKPHOS 141* 132* 127*  BILITOT 0.3 0.4 0.7  PROT 5.1* 5.6* 5.7*  ALBUMIN 2.2* 2.5* 2.6*   CBG: No results for input(s): GLUCAP in the last 168 hours.   Signed: [name], MD [facility] 06/27/2024

## 2024-06-27 NOTE — Plan of Care (Addendum)
 Plan of care is reviewed. Pt is not progressing. She is somnolent, non- interactive , no eye opening with painful stimuli. She only withdraws from pain. Oral medications are on hold. Dr. Shona, the on-call provider is aware. Pt occasionally has incomprehensive moaning and groaning. Mostly she is drowsy, so Dr. Shona suggestion is not to give any opioid or any more sedative effect medication. SPO2 room air 88-89%. On 3 LPM of O2 NCL for supportive kepping SPO2 > 92%. BP 96/76-102/72 mmHg, HR 86-112, afebrile. We will continue monitor.   Problem: Education: Goal: Knowledge of disease or condition will improve Outcome: Not Progressing  Goal: Knowledge of secondary prevention will improve (MUST DOCUMENT ALL) Outcome: Not Progressing  Goal: Knowledge of patient specific risk factors will improve (DELETE if not current risk factor) Outcome: Not Progressing  Problem: Self-Care: Goal: Ability to participate in self-care as condition permits will improve Outcome: Not Progressing  Goal: Verbalization of feelings and concerns over difficulty with self-care will improve Outcome: Not Progressing  Goal: Ability to communicate needs accurately will improve Outcome: Not Progressing    Problem: Nutrition: Goal: Risk of aspiration will decrease Outcome: Not Progressing  Goal: Dietary intake will improve Outcome: Not Progressing   Problem: Clinical Measurements: Goal: Ability to maintain clinical measurements within normal limits will improve Outcome: Not Progressing Goal: Will remain free from infection Outcome: Not Progressing Goal: Diagnostic test results will improve Outcome: Not Progressing Goal: Respiratory complications will improve Outcome: Not Progressing Goal: Cardiovascular complication will be avoided Outcome: Not Progressing  Problem: Activity: Goal: Risk for activity intolerance will decrease Outcome: Not Progressing   Problem: Elimination: Goal: Will not experience  complications related to bowel motility Outcome: Not Progressing  Goal: Will not experience complications related to urinary retention Outcome: Not Progressing  Wendi Dash, RN

## 2024-06-27 NOTE — TOC Transition Note (Signed)
 Transition of Care [facility]) - Discharge Note   Patient Details  Name: Gabriela Moyer MRN: [id number] Date of Birth: [birth year]  Transition of Care [facility]) CM/SW Contact:  Gwenn Julien [place], [place] Phone Number: 06/27/2024, 5:29 PM   Clinical Narrative: Per Hunter with [facility], pt has been approved for [facility] and they are prepared to admit pt today. Pt's family aware of dc and have completed admission consents at [facility]. RN provided with number for report and PTAR arranged for transport. SW signing off at dc.   Julien Gwenn, MSW, LCSW [phone] (coverage)        Final next level of care: Hospice Medical Facility Barriers to Discharge: Barriers Resolved   Patient Goals and CMS Choice            Discharge Placement                Patient to be transferred to facility by: PTAR Name of family member notified: Laura/dtr Patient and family notified of of transfer: 06/27/24  Discharge Plan and Services Additional resources added to the After Visit Summary for                                       Social Drivers of Health (SDOH) Interventions SDOH Screenings   Food Insecurity: No Food Insecurity (06/08/2024)  Housing: Low Risk  (06/08/2024)  Transportation Needs: No Transportation Needs (06/08/2024)  Utilities: Not At Risk (06/08/2024)  Social Connections: Socially Isolated (06/08/2024)  Tobacco Use: Medium Risk (06/06/2024)     Readmission Risk Interventions     No data to display

## 2024-06-27 NOTE — Progress Notes (Signed)
  Progress Note   Patient: Gabriela Moyer FMW:[id number] DOB: [birth year] DOA: 06/06/2024     21 DOS: the patient was seen and examined on 06/27/2024 at 7:05AM and 9:30AM      Brief hospital course: 83 y.o. F with HTN, PVD (RAS s/p renal artery stent in 2010, subclavian, axillary, coronary), hypothyroidism, pAF not on AC due to hx GIB and HLD who presented with headache, vision change and confusion.  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  Neurology consulted.  EEG showed focal seizures.  Mentation waxing and waning.  Underwent LP which was nondiagnostic, then open meningeal biopsy on 6/19.    Biopsy showed secondary CNS lymphoma. Neuro-oncology consulted and evaluated patient. Therapy would require high dose chemotherapy, and the consensus by Oncology and medical team is that this would cause more harm than likelihood of recovery and so family elected for Hospice.       Assessment and Plan: Principal Problem: Secondary CNS lymphoma Active Problems: Acute metabolic encephalopathy Hypothyroidism Atherosclerosis Paroxysmal atrial fibrillation Hypertension Hypomagnesemia Chronic kidney disease stage IIIa Orthostatic hypotension AKI ruled out    -Full comfort care - Continue AEDs for comfort for now - Hold levothyroxine , metoprolol         Subjective: Overnight the patient is less responsive, not awakening to take any oral intake, not responsive to touch or family.     Physical Exam: BP (!) 100/57 (BP Location: Left Arm)   Pulse 88   Temp 98.4 F (36.9 C) (Axillary)   Resp 12   Ht 5' 2 (1.575 m)   Wt 70.3 kg   SpO2 95%   BMI 28.35 kg/m   Elderly adult female, sluggish, unresponsive Heart rate trending up, blood pressure down to 100/60 Respiratory rate increasing, more labored, overall diminished, no rales or wheezes Abdomen without distention, no grimace to palpation Neurologically unresponsive  Data  Reviewed: Discussed with Hospice team    Family Communication: Son, daughter at bedside    Disposition: Status is: Inpatient         Author: [name], MD 06/27/2024 2:02 PM  For on call review [email or web address].

## 2024-06-27 NOTE — Progress Notes (Signed)
 Palliative:  HPI: 83 y.o. female  with past medical history of atrial fibrillation (no anticoagulation due to GIB), HLD, hypertension, PVD s/p renal artery stent (2010), hypothyroidisms, CKD 3. She was admitted on 06/06/2024 from home with severe headache x4 days wit hright-sided vertigo and visual changes. CT of head showed brain lesion. MRI showed leptomeningeal carcinomatosis. Patient seen by Neurology and Dr. 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    I met today at Gabriela Moyer bedside along with family. She received more PRN medications yesterday evening. Family at bedside report she has been resting comfortably since receiving medication. They are in discussions with hospice regarding options and hopeful for [name] - looks like this will work out. Medications and orders reviewed to ensure comfort. Emotional support provided.   Exam: Sleeping peacefully. Unresponsive. No distress. Breathing with some tachypnea. Abd soft.   Plan: - DNR - Full comfort care - Transition to [facility] for end of life care  25 min  Gabriela Kitty, NP Palliative Medicine Team Pager [phone] (Please see [email or web address] for schedule) Team Phone [phone]

## 2024-06-28 ENCOUNTER — Encounter

## 2024-06-30 ENCOUNTER — Other Ambulatory Visit: Payer: Self-pay

## 2024-07-28 LAB — MTB-RIF NAA W AFB CULT, NON-SPUTUM
Acid Fast Culture: NEGATIVE
M tuberculosis complex: NOT DETECTED

## 2024-07-28 LAB — ACID FAST CULTURE WITH REFLEXED SENSITIVITIES (MYCOBACTERIA): Acid Fast Culture: NEGATIVE

## 2024-07-30 DEATH — deceased
